# Patient Record
Sex: Female | Born: 1945
Health system: Southern US, Community
[De-identification: ages and names within clinical notes are randomized; demographics above are authoritative.]

## PROBLEM LIST (undated history)

## (undated) DIAGNOSIS — K219 Gastro-esophageal reflux disease without esophagitis: Secondary | ICD-10-CM

## (undated) DIAGNOSIS — E119 Type 2 diabetes mellitus without complications: Secondary | ICD-10-CM

## (undated) DIAGNOSIS — I2699 Other pulmonary embolism without acute cor pulmonale: Secondary | ICD-10-CM

## (undated) DIAGNOSIS — Z7901 Long term (current) use of anticoagulants: Secondary | ICD-10-CM

## (undated) DIAGNOSIS — IMO0002 Reserved for concepts with insufficient information to code with codable children: Secondary | ICD-10-CM

## (undated) DIAGNOSIS — G4734 Idiopathic sleep related nonobstructive alveolar hypoventilation: Secondary | ICD-10-CM

## (undated) DIAGNOSIS — N951 Menopausal and female climacteric states: Secondary | ICD-10-CM

## (undated) DIAGNOSIS — Z9289 Personal history of other medical treatment: Secondary | ICD-10-CM

## (undated) DIAGNOSIS — J9611 Chronic respiratory failure with hypoxia: Secondary | ICD-10-CM

## (undated) DIAGNOSIS — F419 Anxiety disorder, unspecified: Secondary | ICD-10-CM

## (undated) DIAGNOSIS — J449 Chronic obstructive pulmonary disease, unspecified: Secondary | ICD-10-CM

## (undated) DIAGNOSIS — I480 Paroxysmal atrial fibrillation: Secondary | ICD-10-CM

## (undated) DIAGNOSIS — I5189 Other ill-defined heart diseases: Secondary | ICD-10-CM

## (undated) DIAGNOSIS — K760 Fatty (change of) liver, not elsewhere classified: Secondary | ICD-10-CM

## (undated) DIAGNOSIS — G4700 Insomnia, unspecified: Secondary | ICD-10-CM

## (undated) DIAGNOSIS — M51369 Other intervertebral disc degeneration, lumbar region without mention of lumbar back pain or lower extremity pain: Secondary | ICD-10-CM

## (undated) DIAGNOSIS — Z9221 Personal history of antineoplastic chemotherapy: Secondary | ICD-10-CM

## (undated) DIAGNOSIS — I2510 Atherosclerotic heart disease of native coronary artery without angina pectoris: Secondary | ICD-10-CM

## (undated) DIAGNOSIS — L508 Other urticaria: Secondary | ICD-10-CM

## (undated) DIAGNOSIS — N823 Fistula of vagina to large intestine: Secondary | ICD-10-CM

## (undated) DIAGNOSIS — I700 Atherosclerosis of aorta: Secondary | ICD-10-CM

## (undated) DIAGNOSIS — C569 Malignant neoplasm of unspecified ovary: Secondary | ICD-10-CM

## (undated) DIAGNOSIS — J189 Pneumonia, unspecified organism: Secondary | ICD-10-CM

## (undated) DIAGNOSIS — M1990 Unspecified osteoarthritis, unspecified site: Secondary | ICD-10-CM

## (undated) DIAGNOSIS — I10 Essential (primary) hypertension: Secondary | ICD-10-CM

## (undated) DIAGNOSIS — G4730 Sleep apnea, unspecified: Secondary | ICD-10-CM

## (undated) DIAGNOSIS — F32A Depression, unspecified: Secondary | ICD-10-CM

## (undated) DIAGNOSIS — E785 Hyperlipidemia, unspecified: Secondary | ICD-10-CM

## (undated) DIAGNOSIS — R0789 Other chest pain: Secondary | ICD-10-CM

## (undated) HISTORY — PX: APPENDECTOMY: SHX54

## (undated) HISTORY — DX: Other urticaria: L50.8

## (undated) HISTORY — DX: Chronic obstructive pulmonary disease, unspecified: J44.9

## (undated) HISTORY — DX: Gastro-esophageal reflux disease without esophagitis: K21.9

## (undated) HISTORY — PX: OTHER SURGICAL HISTORY: SHX169

## (undated) HISTORY — DX: Malignant neoplasm of unspecified ovary: C56.9

## (undated) HISTORY — PX: ABDOMINAL HYSTERECTOMY: SHX81

## (undated) HISTORY — DX: Reserved for concepts with insufficient information to code with codable children: IMO0002

## (undated) HISTORY — DX: Pneumonia, unspecified organism: J18.9

## (undated) HISTORY — DX: Other ill-defined heart diseases: I51.89

## (undated) HISTORY — DX: Menopausal and female climacteric states: N95.1

## (undated) HISTORY — DX: Other chest pain: R07.89

## (undated) HISTORY — DX: Paroxysmal atrial fibrillation: I48.0

## (undated) HISTORY — PX: CHOLECYSTECTOMY: SHX55

---

## 1985-02-03 DIAGNOSIS — J189 Pneumonia, unspecified organism: Secondary | ICD-10-CM

## 1985-02-03 HISTORY — DX: Pneumonia, unspecified organism: J18.9

## 2003-11-13 ENCOUNTER — Ambulatory Visit: Payer: Self-pay | Admitting: Specialist

## 2004-11-04 ENCOUNTER — Ambulatory Visit: Payer: Self-pay | Admitting: Internal Medicine

## 2005-11-12 ENCOUNTER — Ambulatory Visit: Payer: Self-pay | Admitting: Internal Medicine

## 2006-11-16 ENCOUNTER — Ambulatory Visit: Payer: Self-pay | Admitting: Internal Medicine

## 2007-11-18 ENCOUNTER — Ambulatory Visit: Payer: Self-pay | Admitting: Internal Medicine

## 2008-04-05 ENCOUNTER — Ambulatory Visit: Payer: Self-pay | Admitting: Internal Medicine

## 2008-12-13 ENCOUNTER — Ambulatory Visit: Payer: Self-pay | Admitting: Internal Medicine

## 2009-12-17 ENCOUNTER — Ambulatory Visit: Payer: Self-pay | Admitting: Internal Medicine

## 2010-02-03 DIAGNOSIS — C569 Malignant neoplasm of unspecified ovary: Secondary | ICD-10-CM

## 2010-02-03 DIAGNOSIS — Z9221 Personal history of antineoplastic chemotherapy: Secondary | ICD-10-CM

## 2010-02-03 HISTORY — DX: Malignant neoplasm of unspecified ovary: C56.9

## 2010-02-03 HISTORY — DX: Personal history of antineoplastic chemotherapy: Z92.21

## 2010-12-07 ENCOUNTER — Inpatient Hospital Stay: Payer: Self-pay | Admitting: General Surgery

## 2010-12-07 DIAGNOSIS — Z01810 Encounter for preprocedural cardiovascular examination: Secondary | ICD-10-CM

## 2010-12-15 LAB — PATHOLOGY REPORT

## 2010-12-16 DIAGNOSIS — Z8543 Personal history of malignant neoplasm of ovary: Secondary | ICD-10-CM | POA: Insufficient documentation

## 2010-12-16 DIAGNOSIS — C569 Malignant neoplasm of unspecified ovary: Secondary | ICD-10-CM | POA: Insufficient documentation

## 2010-12-20 ENCOUNTER — Ambulatory Visit: Payer: Self-pay | Admitting: Internal Medicine

## 2011-01-09 DIAGNOSIS — K219 Gastro-esophageal reflux disease without esophagitis: Secondary | ICD-10-CM | POA: Insufficient documentation

## 2011-07-14 ENCOUNTER — Ambulatory Visit: Payer: Self-pay | Admitting: Neurology

## 2011-12-22 ENCOUNTER — Ambulatory Visit: Payer: Self-pay | Admitting: Internal Medicine

## 2012-07-07 ENCOUNTER — Encounter: Payer: Self-pay | Admitting: Internal Medicine

## 2012-07-08 ENCOUNTER — Institutional Professional Consult (permissible substitution): Payer: PRIVATE HEALTH INSURANCE | Admitting: Internal Medicine

## 2012-07-08 ENCOUNTER — Encounter: Payer: Self-pay | Admitting: Internal Medicine

## 2012-07-08 ENCOUNTER — Ambulatory Visit (INDEPENDENT_AMBULATORY_CARE_PROVIDER_SITE_OTHER): Payer: Medicare Other | Admitting: Internal Medicine

## 2012-07-08 VITALS — BP 118/72 | HR 78 | Temp 97.6°F | Ht 63.0 in | Wt 203.2 lb

## 2012-07-08 DIAGNOSIS — J441 Chronic obstructive pulmonary disease with (acute) exacerbation: Secondary | ICD-10-CM | POA: Insufficient documentation

## 2012-07-08 NOTE — Patient Instructions (Addendum)
Weight control is simply a matter of calorie balance which needs to be tilted in your favor by eating less and exercising more.  To get the most out of exercise, you need to be continuously aware that you are short of breath, but never out of breath, for 30 minutes daily. As you improve, it will actually be easier for you to do the same amount of exercise  in  30 minutes so always push to the level where you are short of breath.  If this does not result in gradual weight reduction then I strongly recommend you see a nutritionist with a food diary x 2 weeks so that we can work out a negative calorie balance which is universally effective in steady weight loss programs.  Think of your calorie balance like you do your bank account where in this case you want the balance to go down so you must take in less calories than you burn up.  It's just that simple:  Hard to do, but easy to understand.  Good luck!   Please schedule a follow up office visit in 6 weeks, call sooner if needed

## 2012-07-08 NOTE — Assessment & Plan Note (Addendum)
Clinically mild to moderate and really not limiting her at this point.   As I explained to this patient in detail:  although there may be significant copd present, it does not appear to be limiting activity tolerance any more than a set of worn tires limits someone from driving a car  around a parking lot.  A new set of Michelins might look good but would have no perceived impact on the performance of the car and would not be worth the cost.  For now will wait until pft's available before making recommendation for longterm rx.

## 2012-07-08 NOTE — Progress Notes (Signed)
  Subjective:    Patient ID: Kayla Wu, female    DOB: 10/29/1945  MRN: [id number]  HPI  59 yowf quit smoking June 2009 with chronic doe no better p quit smoking referred 07/08/2012 to pulmonary clinic for sob having gained 30 lbs since quit.  07/08/2012 1st pulmonary ov cc indolent onset doe x 30 years really no worse than baseline with more limit by bilateral calf pain esp going up inclines x sev months .  Can do 10 min on treadmill at 0 grade and 3 mph  No obvious daytime variabilty or assoc chronic cough or cp or chest tightness, subjective wheeze overt sinus or hb symptoms. No unusual exp hx or h/o childhood pna/ asthma or premature birth to her  knowledge.   Sleeping ok without nocturnal  or early am exacerbation  of respiratory  c/o's or need for noct saba. Also denies any obvious fluctuation of symptoms with weather or environmental changes or other aggravating or alleviating factors except as outlined above.   Review of Systems  Constitutional: Negative for fever, chills and unexpected weight change.  HENT: Negative for ear pain, nosebleeds, congestion, sore throat, rhinorrhea, sneezing, trouble swallowing, dental problem, voice change, postnasal drip and sinus pressure.   Eyes: Negative for visual disturbance.  Respiratory: Positive for shortness of breath. Negative for cough and choking.   Cardiovascular: Positive for leg swelling. Negative for chest pain.  Gastrointestinal: Negative for vomiting, abdominal pain and diarrhea.  Genitourinary: Negative for difficulty urinating.  Musculoskeletal: Negative for arthralgias.  Skin: Negative for rash.  Neurological: Negative for tremors, syncope and headaches.  Hematological: Does not bruise/bleed easily.  Psychiatric/Behavioral:       Anxiety       Objective:   Physical Exam   amb wf nad Wt Readings from Last 3 Encounters:  07/08/12 203 lb 3.2 oz (92.171 kg)     HEENT mild turbinate edema.  Oropharynx no thrush or excess  pnd or cobblestoning.  No JVD or cervical adenopathy. Mild accessory muscle hypertrophy. Trachea midline, nl thryroid. Chest was hyperinflated by percussion with diminished breath sounds and moderate increased exp time without wheeze. Hoover sign positive at mid inspiration. Regular rate and rhythm without murmur gallop or rub or increase P2 or edema.  Abd: no hsm, nl excursion. Ext warm without cyanosis or clubbing.    cxr 11/04/11 No acute cp dz       Assessment & Plan:

## 2012-07-16 ENCOUNTER — Encounter: Payer: Self-pay | Admitting: Internal Medicine

## 2012-08-19 ENCOUNTER — Ambulatory Visit: Payer: Medicare Other | Admitting: Internal Medicine

## 2012-12-27 ENCOUNTER — Ambulatory Visit: Payer: Self-pay | Admitting: Internal Medicine

## 2013-02-25 ENCOUNTER — Ambulatory Visit: Payer: Self-pay | Admitting: Internal Medicine

## 2013-03-28 ENCOUNTER — Ambulatory Visit: Payer: Self-pay | Admitting: Unknown Physician Specialty

## 2013-03-28 LAB — CBC WITH DIFFERENTIAL/PLATELET
Basophil #: 0.1 10*3/uL (ref 0.0–0.1)
Basophil %: 1.3 %
EOS ABS: 0.2 10*3/uL (ref 0.0–0.7)
EOS PCT: 1.8 %
HCT: 34.2 % — ABNORMAL LOW (ref 35.0–47.0)
HGB: 11.5 g/dL — ABNORMAL LOW (ref 12.0–16.0)
LYMPHS PCT: 26.7 %
Lymphocyte #: 2.8 10*3/uL (ref 1.0–3.6)
MCH: 31.1 pg (ref 26.0–34.0)
MCHC: 33.6 g/dL (ref 32.0–36.0)
MCV: 93 fL (ref 80–100)
MONO ABS: 0.8 x10 3/mm (ref 0.2–0.9)
Monocyte %: 7.2 %
Neutrophil #: 6.7 10*3/uL — ABNORMAL HIGH (ref 1.4–6.5)
Neutrophil %: 63 %
Platelet: 371 10*3/uL (ref 150–440)
RBC: 3.7 10*6/uL — AB (ref 3.80–5.20)
RDW: 13.3 % (ref 11.5–14.5)
WBC: 10.7 10*3/uL (ref 3.6–11.0)

## 2013-03-30 LAB — PATHOLOGY REPORT

## 2013-04-11 ENCOUNTER — Ambulatory Visit: Payer: Self-pay | Admitting: Podiatry

## 2013-04-19 ENCOUNTER — Ambulatory Visit: Payer: Self-pay | Admitting: Podiatry

## 2013-04-20 DIAGNOSIS — M539 Dorsopathy, unspecified: Secondary | ICD-10-CM | POA: Insufficient documentation

## 2013-04-20 DIAGNOSIS — M9979 Connective tissue and disc stenosis of intervertebral foramina of abdomen and other regions: Secondary | ICD-10-CM | POA: Insufficient documentation

## 2013-04-20 DIAGNOSIS — I10 Essential (primary) hypertension: Secondary | ICD-10-CM | POA: Insufficient documentation

## 2013-04-20 DIAGNOSIS — E785 Hyperlipidemia, unspecified: Secondary | ICD-10-CM | POA: Insufficient documentation

## 2013-04-20 DIAGNOSIS — E7800 Pure hypercholesterolemia, unspecified: Secondary | ICD-10-CM | POA: Insufficient documentation

## 2013-04-20 DIAGNOSIS — J431 Panlobular emphysema: Secondary | ICD-10-CM | POA: Insufficient documentation

## 2013-04-20 DIAGNOSIS — J449 Chronic obstructive pulmonary disease, unspecified: Secondary | ICD-10-CM | POA: Insufficient documentation

## 2014-01-17 ENCOUNTER — Ambulatory Visit: Payer: Self-pay | Admitting: Internal Medicine

## 2014-02-27 DIAGNOSIS — C569 Malignant neoplasm of unspecified ovary: Secondary | ICD-10-CM | POA: Diagnosis not present

## 2014-03-26 DIAGNOSIS — J069 Acute upper respiratory infection, unspecified: Secondary | ICD-10-CM | POA: Diagnosis not present

## 2014-05-15 DIAGNOSIS — C569 Malignant neoplasm of unspecified ovary: Secondary | ICD-10-CM | POA: Diagnosis not present

## 2014-05-16 DIAGNOSIS — E780 Pure hypercholesterolemia: Secondary | ICD-10-CM | POA: Diagnosis not present

## 2014-05-16 DIAGNOSIS — Z1329 Encounter for screening for other suspected endocrine disorder: Secondary | ICD-10-CM | POA: Diagnosis not present

## 2014-05-16 DIAGNOSIS — Z79899 Other long term (current) drug therapy: Secondary | ICD-10-CM | POA: Diagnosis not present

## 2014-05-16 DIAGNOSIS — J449 Chronic obstructive pulmonary disease, unspecified: Secondary | ICD-10-CM | POA: Diagnosis not present

## 2014-05-16 DIAGNOSIS — R7309 Other abnormal glucose: Secondary | ICD-10-CM | POA: Diagnosis not present

## 2014-05-16 DIAGNOSIS — R0602 Shortness of breath: Secondary | ICD-10-CM | POA: Diagnosis not present

## 2014-05-16 DIAGNOSIS — I10 Essential (primary) hypertension: Secondary | ICD-10-CM | POA: Diagnosis not present

## 2014-05-23 DIAGNOSIS — R0602 Shortness of breath: Secondary | ICD-10-CM | POA: Diagnosis not present

## 2014-05-29 DIAGNOSIS — M1812 Unilateral primary osteoarthritis of first carpometacarpal joint, left hand: Secondary | ICD-10-CM | POA: Diagnosis not present

## 2014-06-05 DIAGNOSIS — I10 Essential (primary) hypertension: Secondary | ICD-10-CM | POA: Diagnosis not present

## 2014-06-05 DIAGNOSIS — R0602 Shortness of breath: Secondary | ICD-10-CM | POA: Diagnosis not present

## 2014-06-05 DIAGNOSIS — D6489 Other specified anemias: Secondary | ICD-10-CM | POA: Diagnosis not present

## 2014-06-05 DIAGNOSIS — J449 Chronic obstructive pulmonary disease, unspecified: Secondary | ICD-10-CM | POA: Diagnosis not present

## 2014-06-06 ENCOUNTER — Other Ambulatory Visit: Payer: Self-pay | Admitting: Internal Medicine

## 2014-06-06 DIAGNOSIS — R0602 Shortness of breath: Secondary | ICD-10-CM

## 2014-06-07 ENCOUNTER — Ambulatory Visit
Admission: RE | Admit: 2014-06-07 | Discharge: 2014-06-07 | Disposition: A | Discharge status: Home / Self Care | Payer: Commercial Managed Care - HMO | Source: Ambulatory Visit | Attending: Internal Medicine | Admitting: Internal Medicine

## 2014-06-07 DIAGNOSIS — R0602 Shortness of breath: Secondary | ICD-10-CM | POA: Diagnosis present

## 2014-06-07 DIAGNOSIS — R918 Other nonspecific abnormal finding of lung field: Secondary | ICD-10-CM | POA: Insufficient documentation

## 2014-06-07 DIAGNOSIS — I2510 Atherosclerotic heart disease of native coronary artery without angina pectoris: Secondary | ICD-10-CM | POA: Diagnosis not present

## 2014-06-07 HISTORY — DX: Essential (primary) hypertension: I10

## 2014-06-07 MED ORDER — IOHEXOL 350 MG/ML SOLN
100.0000 mL | Freq: Once | INTRAVENOUS | Status: AC | PRN
  Administered 2014-06-07: 100 mL via INTRAVENOUS

## 2014-06-09 DIAGNOSIS — D6489 Other specified anemias: Secondary | ICD-10-CM | POA: Diagnosis not present

## 2014-07-06 DIAGNOSIS — D6489 Other specified anemias: Secondary | ICD-10-CM | POA: Diagnosis not present

## 2014-07-06 DIAGNOSIS — Z79899 Other long term (current) drug therapy: Secondary | ICD-10-CM | POA: Diagnosis not present

## 2014-07-11 DIAGNOSIS — I10 Essential (primary) hypertension: Secondary | ICD-10-CM | POA: Diagnosis not present

## 2014-07-11 DIAGNOSIS — E780 Pure hypercholesterolemia: Secondary | ICD-10-CM | POA: Diagnosis not present

## 2014-07-11 DIAGNOSIS — J449 Chronic obstructive pulmonary disease, unspecified: Secondary | ICD-10-CM | POA: Diagnosis not present

## 2014-07-11 DIAGNOSIS — R0609 Other forms of dyspnea: Secondary | ICD-10-CM | POA: Diagnosis not present

## 2014-07-19 DIAGNOSIS — R0602 Shortness of breath: Secondary | ICD-10-CM | POA: Diagnosis not present

## 2014-07-19 DIAGNOSIS — R079 Chest pain, unspecified: Secondary | ICD-10-CM | POA: Diagnosis not present

## 2014-07-19 DIAGNOSIS — R0609 Other forms of dyspnea: Secondary | ICD-10-CM | POA: Insufficient documentation

## 2014-07-19 DIAGNOSIS — R0681 Apnea, not elsewhere classified: Secondary | ICD-10-CM | POA: Insufficient documentation

## 2014-07-19 DIAGNOSIS — I10 Essential (primary) hypertension: Secondary | ICD-10-CM | POA: Diagnosis not present

## 2014-07-24 DIAGNOSIS — Z8543 Personal history of malignant neoplasm of ovary: Secondary | ICD-10-CM | POA: Diagnosis not present

## 2014-07-24 DIAGNOSIS — K219 Gastro-esophageal reflux disease without esophagitis: Secondary | ICD-10-CM | POA: Diagnosis not present

## 2014-07-24 DIAGNOSIS — E780 Pure hypercholesterolemia: Secondary | ICD-10-CM | POA: Diagnosis not present

## 2014-07-24 DIAGNOSIS — R0602 Shortness of breath: Secondary | ICD-10-CM | POA: Diagnosis not present

## 2014-07-24 DIAGNOSIS — C569 Malignant neoplasm of unspecified ovary: Secondary | ICD-10-CM | POA: Diagnosis not present

## 2014-07-24 DIAGNOSIS — Z6834 Body mass index (BMI) 34.0-34.9, adult: Secondary | ICD-10-CM | POA: Diagnosis not present

## 2014-07-24 DIAGNOSIS — Z08 Encounter for follow-up examination after completed treatment for malignant neoplasm: Secondary | ICD-10-CM | POA: Diagnosis not present

## 2014-07-31 DIAGNOSIS — E780 Pure hypercholesterolemia: Secondary | ICD-10-CM | POA: Diagnosis not present

## 2014-07-31 DIAGNOSIS — R0602 Shortness of breath: Secondary | ICD-10-CM | POA: Diagnosis not present

## 2014-07-31 DIAGNOSIS — I10 Essential (primary) hypertension: Secondary | ICD-10-CM | POA: Diagnosis not present

## 2014-07-31 DIAGNOSIS — R079 Chest pain, unspecified: Secondary | ICD-10-CM | POA: Diagnosis not present

## 2014-08-08 DIAGNOSIS — R0602 Shortness of breath: Secondary | ICD-10-CM | POA: Diagnosis not present

## 2014-08-29 DIAGNOSIS — J449 Chronic obstructive pulmonary disease, unspecified: Secondary | ICD-10-CM | POA: Diagnosis not present

## 2014-08-29 DIAGNOSIS — E780 Pure hypercholesterolemia: Secondary | ICD-10-CM | POA: Diagnosis not present

## 2014-08-29 DIAGNOSIS — R0602 Shortness of breath: Secondary | ICD-10-CM | POA: Diagnosis not present

## 2014-08-29 DIAGNOSIS — I10 Essential (primary) hypertension: Secondary | ICD-10-CM | POA: Diagnosis not present

## 2014-10-03 DIAGNOSIS — R7309 Other abnormal glucose: Secondary | ICD-10-CM | POA: Diagnosis not present

## 2014-10-03 DIAGNOSIS — E780 Pure hypercholesterolemia: Secondary | ICD-10-CM | POA: Diagnosis not present

## 2014-10-03 DIAGNOSIS — Z79899 Other long term (current) drug therapy: Secondary | ICD-10-CM | POA: Diagnosis not present

## 2014-10-11 DIAGNOSIS — R739 Hyperglycemia, unspecified: Secondary | ICD-10-CM | POA: Insufficient documentation

## 2014-10-11 DIAGNOSIS — E780 Pure hypercholesterolemia: Secondary | ICD-10-CM | POA: Diagnosis not present

## 2014-10-11 DIAGNOSIS — I10 Essential (primary) hypertension: Secondary | ICD-10-CM | POA: Diagnosis not present

## 2014-10-11 DIAGNOSIS — J431 Panlobular emphysema: Secondary | ICD-10-CM | POA: Diagnosis not present

## 2014-10-11 DIAGNOSIS — E118 Type 2 diabetes mellitus with unspecified complications: Secondary | ICD-10-CM | POA: Insufficient documentation

## 2014-10-16 DIAGNOSIS — C569 Malignant neoplasm of unspecified ovary: Secondary | ICD-10-CM | POA: Diagnosis not present

## 2014-10-20 DIAGNOSIS — M1812 Unilateral primary osteoarthritis of first carpometacarpal joint, left hand: Secondary | ICD-10-CM | POA: Diagnosis not present

## 2014-11-14 DIAGNOSIS — Z1283 Encounter for screening for malignant neoplasm of skin: Secondary | ICD-10-CM | POA: Diagnosis not present

## 2014-11-14 DIAGNOSIS — L820 Inflamed seborrheic keratosis: Secondary | ICD-10-CM | POA: Diagnosis not present

## 2014-11-14 DIAGNOSIS — L720 Epidermal cyst: Secondary | ICD-10-CM | POA: Diagnosis not present

## 2014-11-14 DIAGNOSIS — L718 Other rosacea: Secondary | ICD-10-CM | POA: Diagnosis not present

## 2014-11-14 DIAGNOSIS — D485 Neoplasm of uncertain behavior of skin: Secondary | ICD-10-CM | POA: Diagnosis not present

## 2014-11-14 DIAGNOSIS — D1800 Hemangioma unspecified site: Secondary | ICD-10-CM | POA: Diagnosis not present

## 2014-11-14 DIAGNOSIS — Z85828 Personal history of other malignant neoplasm of skin: Secondary | ICD-10-CM | POA: Diagnosis not present

## 2014-11-14 DIAGNOSIS — L821 Other seborrheic keratosis: Secondary | ICD-10-CM | POA: Diagnosis not present

## 2014-12-12 ENCOUNTER — Other Ambulatory Visit: Payer: Commercial Managed Care - HMO

## 2014-12-12 ENCOUNTER — Encounter
Admission: RE | Admit: 2014-12-12 | Discharge: 2014-12-12 | Disposition: A | Discharge status: Home / Self Care | Payer: Commercial Managed Care - HMO | Source: Ambulatory Visit | Attending: Specialist | Admitting: Specialist

## 2014-12-12 DIAGNOSIS — Z01812 Encounter for preprocedural laboratory examination: Secondary | ICD-10-CM | POA: Diagnosis not present

## 2014-12-12 LAB — CBC
HEMATOCRIT: 34.3 % — AB (ref 35.0–47.0)
Hemoglobin: 11.7 g/dL — ABNORMAL LOW (ref 12.0–16.0)
MCH: 29.6 pg (ref 26.0–34.0)
MCHC: 34.2 g/dL (ref 32.0–36.0)
MCV: 86.7 fL (ref 80.0–100.0)
Platelets: 400 10*3/uL (ref 150–440)
RBC: 3.95 MIL/uL (ref 3.80–5.20)
RDW: 18.5 % — ABNORMAL HIGH (ref 11.5–14.5)
WBC: 10.2 10*3/uL (ref 3.6–11.0)

## 2014-12-12 NOTE — Patient Instructions (Addendum)
  Your procedure is scheduled on: Friday Nov. 18, 2016. Report to Same Day Surgery. To find out your arrival time please call [phone] between 1PM - 3PM on Thursday Nov. 17, 2016.  Remember: Instructions that are not followed completely may result in serious medical risk, up to and including death, or upon the discretion of your surgeon and anesthesiologist your surgery may need to be rescheduled.    _x___ 1. Do not eat food or drink liquids after midnight. No gum chewing or hard candies.     ____ 2. No Alcohol for 24 hours before or after surgery.   ____ 3. Bring all medications with you on the day of surgery if instructed.    __x__ 4. Notify your doctor if there is any change in your medical condition     (cold, fever, infections).     Do not wear jewelry, make-up, hairpins, clips or nail polish.  Do not wear lotions, powders, or perfumes. You may wear deodorant.  Do not shave 48 hours prior to surgery. Men may shave face and neck.  Do not bring valuables to the hospital.    [facility] is not responsible for any belongings or valuables.               Contacts, dentures or bridgework may not be worn into surgery.  Leave your suitcase in the car. After surgery it may be brought to your room.  For patients admitted to the hospital, discharge time is determined by your treatment team.   Patients discharged the day of surgery will not be allowed to drive home.    Please read over the following fact sheets that you were given:   [facility] Preparing for Surgery  __x__ Take these medicines the morning of surgery with A SIP OF WATER:    1.losartan (COZAAR)    2. omeprazole (PRILOSEC)   ____ Fleet Enema (as directed)   _x___ Use CHG Soap as directed  ____ Use inhalers on the day of surgery  ____ Stop metformin 2 days prior to surgery    ____ Take 1/2 of usual insulin dose the night before surgery and none on the morning of surgery.   __x__ Stop aspirin on December 17, 2014 per Dr. Thompson Caul instructions.  _x__ Stop Anti-inflammatories Aleve now.  Tyelnol may be taken for pain.   ____ Stop supplements until after surgery.    ____ Bring C-Pap to the hospital.

## 2014-12-18 DIAGNOSIS — I10 Essential (primary) hypertension: Secondary | ICD-10-CM | POA: Diagnosis not present

## 2014-12-18 DIAGNOSIS — E7800 Pure hypercholesterolemia, unspecified: Secondary | ICD-10-CM | POA: Diagnosis not present

## 2014-12-18 DIAGNOSIS — J431 Panlobular emphysema: Secondary | ICD-10-CM | POA: Diagnosis not present

## 2014-12-18 DIAGNOSIS — R739 Hyperglycemia, unspecified: Secondary | ICD-10-CM | POA: Diagnosis not present

## 2014-12-22 ENCOUNTER — Encounter: Payer: Self-pay | Admitting: *Deleted

## 2014-12-22 ENCOUNTER — Ambulatory Visit
Admission: RE | Admit: 2014-12-22 | Discharge: 2014-12-22 | Disposition: A | Discharge status: Home / Self Care | Payer: Commercial Managed Care - HMO | Source: Ambulatory Visit | Attending: Specialist | Admitting: Specialist

## 2014-12-22 ENCOUNTER — Ambulatory Visit: Payer: Commercial Managed Care - HMO | Admitting: Certified Registered Nurse Anesthetist

## 2014-12-22 ENCOUNTER — Ambulatory Visit: Payer: Self-pay | Admitting: Specialist

## 2014-12-22 ENCOUNTER — Encounter
Admission: RE | Disposition: A | Discharge status: Home / Self Care | Payer: Self-pay | Source: Ambulatory Visit | Attending: Specialist

## 2014-12-22 DIAGNOSIS — Z87891 Personal history of nicotine dependence: Secondary | ICD-10-CM | POA: Insufficient documentation

## 2014-12-22 DIAGNOSIS — K219 Gastro-esophageal reflux disease without esophagitis: Secondary | ICD-10-CM | POA: Insufficient documentation

## 2014-12-22 DIAGNOSIS — M1990 Unspecified osteoarthritis, unspecified site: Secondary | ICD-10-CM | POA: Insufficient documentation

## 2014-12-22 DIAGNOSIS — J449 Chronic obstructive pulmonary disease, unspecified: Secondary | ICD-10-CM | POA: Diagnosis not present

## 2014-12-22 DIAGNOSIS — I10 Essential (primary) hypertension: Secondary | ICD-10-CM | POA: Insufficient documentation

## 2014-12-22 DIAGNOSIS — M1991 Primary osteoarthritis, unspecified site: Secondary | ICD-10-CM | POA: Diagnosis present

## 2014-12-22 DIAGNOSIS — M1812 Unilateral primary osteoarthritis of first carpometacarpal joint, left hand: Secondary | ICD-10-CM | POA: Diagnosis not present

## 2014-12-22 HISTORY — PX: FINGER ARTHROPLASTY: SHX5017

## 2014-12-22 SURGERY — ARTHROPLASTY, FINGER
Anesthesia: Regional | Site: Hand | Laterality: Left | Wound class: Clean

## 2014-12-22 MED ORDER — BUPIVACAINE HCL (PF) 0.5 % IJ SOLN
INTRAMUSCULAR | Status: DC | PRN
Start: 2014-12-22 — End: 2014-12-22
  Administered 2014-12-22: 8 mL

## 2014-12-22 MED ORDER — ACETAMINOPHEN 10 MG/ML IV SOLN
INTRAVENOUS | Status: DC | PRN
  Administered 2014-12-22: 1000 mg via INTRAVENOUS

## 2014-12-22 MED ORDER — CEFAZOLIN SODIUM-DEXTROSE 2-3 GM-% IV SOLR
INTRAVENOUS | Status: AC
  Filled 2014-12-22: qty 50

## 2014-12-22 MED ORDER — LIDOCAINE HCL (PF) 0.5 % IJ SOLN
INTRAMUSCULAR | Status: DC | PRN
  Administered 2014-12-22: 250 mg via INTRAVENOUS

## 2014-12-22 MED ORDER — BUPIVACAINE HCL (PF) 0.5 % IJ SOLN
INTRAMUSCULAR | Status: AC
  Filled 2014-12-22: qty 30

## 2014-12-22 MED ORDER — NEOMYCIN-POLYMYXIN B GU 40-200000 IR SOLN
Status: DC | PRN
  Administered 2014-12-22: 2 mL

## 2014-12-22 MED ORDER — ACETAMINOPHEN 10 MG/ML IV SOLN
INTRAVENOUS | Status: AC
  Filled 2014-12-22: qty 100

## 2014-12-22 MED ORDER — GELATIN ABSORBABLE 12-7 MM EX MISC
CUTANEOUS | Status: AC
  Filled 2014-12-22: qty 1

## 2014-12-22 MED ORDER — HYDROCODONE-ACETAMINOPHEN 5-325 MG PO TABS: 1.0000 | ORAL_TABLET | Freq: Four times a day (QID) | ORAL | Status: DC | PRN

## 2014-12-22 MED ORDER — MIDAZOLAM HCL 2 MG/2ML IJ SOLN
INTRAMUSCULAR | Status: DC | PRN
  Administered 2014-12-22: 2 mg via INTRAVENOUS

## 2014-12-22 MED ORDER — LIDOCAINE HCL (PF) 1 % IJ SOLN
INTRAMUSCULAR | Status: DC | PRN
  Administered 2014-12-22: 10 mL

## 2014-12-22 MED ORDER — CEFAZOLIN SODIUM-DEXTROSE 2-3 GM-% IV SOLR: 2.0000 g | Freq: Once | INTRAVENOUS | Status: DC

## 2014-12-22 MED ORDER — PROPOFOL 500 MG/50ML IV EMUL
INTRAVENOUS | Status: DC | PRN
  Administered 2014-12-22: 75 ug/kg/min via INTRAVENOUS

## 2014-12-22 MED ORDER — PROPOFOL 500 MG/50ML IV EMUL: INTRAVENOUS | Status: DC | PRN

## 2014-12-22 MED ORDER — FENTANYL CITRATE (PF) 100 MCG/2ML IJ SOLN
INTRAMUSCULAR | Status: DC | PRN
  Administered 2014-12-22: 50 ug via INTRAVENOUS
  Administered 2014-12-22 (×2): 25 ug via INTRAVENOUS

## 2014-12-22 MED ORDER — GELATIN ABSORBABLE 12-7 MM EX MISC
CUTANEOUS | Status: AC
Start: 2014-12-22 — End: 2014-12-22
  Filled 2014-12-22: qty 1

## 2014-12-22 MED ORDER — LIDOCAINE HCL (PF) 0.5 % IJ SOLN
INTRAMUSCULAR | Status: AC
  Filled 2014-12-22: qty 50

## 2014-12-22 MED ORDER — NEOMYCIN-POLYMYXIN B GU 40-200000 IR SOLN
Status: AC
  Filled 2014-12-22: qty 2

## 2014-12-22 MED ORDER — KETOROLAC TROMETHAMINE 30 MG/ML IJ SOLN
INTRAMUSCULAR | Status: DC | PRN
Start: 2014-12-22 — End: 2014-12-22
  Administered 2014-12-22: 30 mg via INTRAVENOUS

## 2014-12-22 MED ORDER — ONDANSETRON HCL 4 MG/2ML IJ SOLN: 4.0000 mg | Freq: Once | INTRAMUSCULAR | Status: DC | PRN

## 2014-12-22 MED ORDER — LACTATED RINGERS IV SOLN
INTRAVENOUS | Status: DC
  Administered 2014-12-22: 07:00:00 via INTRAVENOUS

## 2014-12-22 MED ORDER — FENTANYL CITRATE (PF) 100 MCG/2ML IJ SOLN: 25.0000 ug | INTRAMUSCULAR | Status: DC | PRN

## 2014-12-22 SURGICAL SUPPLY — 38 items
BANDAGE ELASTIC 3 LF NS (GAUZE/BANDAGES/DRESSINGS) ×2 IMPLANT
BIT DRILL 2.5 CANN LNG (BIT) ×2 IMPLANT
BIT DRILL 2.7 (BIT) ×1
BIT DRILL 2.7X2.7/3XSCR ANKL (BIT) ×1 IMPLANT
BIT DRL 2.7X2.7/3XSCR ANKL (BIT) ×1
BNDG ESMARK 4X12 TAN STRL LF (GAUZE/BANDAGES/DRESSINGS) ×2 IMPLANT
BUR 3.0X8 (BURR) ×2 IMPLANT
BUR RND POLISHING (BURR) IMPLANT
BUR SURG RND 3.0X8 FLTD (BURR) IMPLANT
CANISTER SUCT 1200ML W/VALVE (MISCELLANEOUS) ×2 IMPLANT
CAST PADDING 2X4YD ST 30245 (MISCELLANEOUS) ×1
CHLORAPREP W/TINT 26ML (MISCELLANEOUS) ×2 IMPLANT
GAUZE PETRO XEROFOAM 1X8 (MISCELLANEOUS) ×2 IMPLANT
GAUZE SPONGE 4X4 12PLY STRL (GAUZE/BANDAGES/DRESSINGS) ×2 IMPLANT
GLOVE BIO SURGEON STRL SZ7.5 (GLOVE) ×2 IMPLANT
GOWN STRL REUS W/ TWL LRG LVL3 (GOWN DISPOSABLE) ×1 IMPLANT
GOWN STRL REUS W/TWL LRG LVL3 (GOWN DISPOSABLE) ×1
NDL KEITH SZ2.5 (NEEDLE) ×2 IMPLANT
NEEDLE FILTER BLUNT 18X 1/2SAF (NEEDLE) ×1
NEEDLE FILTER BLUNT 18X1 1/2 (NEEDLE) ×1 IMPLANT
NS IRRIG 500ML POUR BTL (IV SOLUTION) ×2 IMPLANT
PACK EXTREMITY ARMC (MISCELLANEOUS) ×2 IMPLANT
PAD GROUND ADULT SPLIT (MISCELLANEOUS) ×2 IMPLANT
PADDING CAST COTTON 2X4 ST (MISCELLANEOUS) ×1 IMPLANT
SPLINT CAST 1 STEP 3X12 (MISCELLANEOUS) ×2 IMPLANT
STOCKINETTE STRL 4IN 9604848 (GAUZE/BANDAGES/DRESSINGS) ×2 IMPLANT
STRAP SAFETY BODY (MISCELLANEOUS) ×2 IMPLANT
STRIP CLOSURE SKIN 1/4X4 (GAUZE/BANDAGES/DRESSINGS) ×2 IMPLANT
SUT ETHIBOND 0 (SUTURE) ×2 IMPLANT
SUT ETHIBOND 3-0  EXTR (SUTURE) ×1
SUT ETHIBOND 3-0 EXTR (SUTURE) ×1 IMPLANT
SUT MERSILENE 4-0 WHT RB-1 (SUTURE) ×4 IMPLANT
SUT PROLENE 3 0 FS 2 (SUTURE) ×4 IMPLANT
SUT VIC AB 2-0 SH 27 (SUTURE) ×1
SUT VIC AB 2-0 SH 27XBRD (SUTURE) ×1 IMPLANT
SUT VICRYL 3-0 RB1 18 ABS (SUTURE) IMPLANT
SUT VICRYL 5-0 (SUTURE) ×2 IMPLANT
SYRINGE 10CC LL (SYRINGE) ×2 IMPLANT

## 2014-12-22 NOTE — Anesthesia Procedure Notes (Addendum)
Anesthesia Regional Block:  Bier block (IV Regional)  Pre-Anesthetic Checklist: ,, timeout performed, Correct Patient, Correct Site, Correct Laterality, Correct Procedure, Correct Position, site marked, Risks and benefits discussed,  Surgical consent,  Pre-op evaluation,  At surgeon's request and post-op pain management  Laterality: Left  Prep: alcohol swabs       Needles:   Needle Type: Other   (20G angiocath)        Additional Needles: Bier block (IV Regional) Narrative:  Start time: 12/22/2014 7:45 AM End time: 12/22/2014 7:50 AM  Performed by: Personally  Anesthesiologist: Alvin Critchley  Additional Notes: Time out called.   20 G angiocath in dorsum of Left hand.  Double tourniquet applied and checked with a pressure of 300 bilaterally.  [place] wrap with good effect and both tourniquets applied.  Lidocaine 0.5% plain with 1cc of toradol and 1cc of fentanyl injected with good effect.  Patient tolerated the procedure well and patient has numbness to pin prick   Date/Time: 12/22/2014 7:31 AM Performed by: Johnna Acosta Pre-anesthesia Checklist: Patient identified, Emergency Drugs available, Suction available, Patient being monitored and Timeout performed Patient Re-evaluated:Patient Re-evaluated prior to inductionOxygen Delivery Method: Nasal cannula

## 2014-12-22 NOTE — Transfer of Care (Signed)
Immediate Anesthesia Transfer of Care Note  Patient: Kayla Wu  Procedure(s) Performed: Procedure(s): FINGER ARTHROPLASTY (Left)  Patient Location: PACU  Anesthesia Type:Bier block  Level of Consciousness: sedated  Airway & Oxygen Therapy: Patient Spontanous Breathing and Patient connected to nasal cannula oxygen  Post-op Assessment: Report given to RN and Post -op Vital signs reviewed and stable  Post vital signs: Reviewed and stable  Last Vitals:  Filed Vitals:   12/22/14 0928  BP: 123/66  Pulse: 81  Temp: 36.1 C  Resp: 12    Complications: No apparent anesthesia complications

## 2014-12-22 NOTE — H&P (Signed)
  69 year old female with primary osteoarthritis CMC joint left thumb.  History and physical exam has been inserted in the record in the form of a paper document.  Heart and lungs are clear.  ENT normal.  Plan: CMC arthroplasty base of left thumb.

## 2014-12-22 NOTE — Brief Op Note (Signed)
12/22/2014  9:47 AM  PATIENT:  Kayla Wu  69 y.o. female  PRE-OPERATIVE DIAGNOSIS:  OSTEOARTHROSIS OF THE CARPOMETACARPAL JOINT OF THUMB  POST-OPERATIVE DIAGNOSIS:  OSTEOARTHROSIS OF THE CARPOMETACARPAL JOINT OF THUMB  PROCEDURE:  Procedure(s): FINGER ARTHROPLASTY (Left)  SURGEON:  Surgeon(s) and Role:    * [name], MD - Primary  PHYSICIAN ASSISTANT:   ASSISTANTS: none   ANESTHESIA:   regional  EBL:  Total I/O In: 400 [I.V.:400] Out: 0   BLOOD ADMINISTERED:none  DRAINS: none   LOCAL MEDICATIONS USED:  MARCAINE     SPECIMEN:  No Specimen  DISPOSITION OF SPECIMEN:  N/A  COUNTS:  YES  TOURNIQUET:    DICTATION: .Other Dictation: Dictation Number [id number]  PLAN OF CARE: Discharge to home after PACU  PATIENT DISPOSITION:  PACU - hemodynamically stable.   Delay start of Pharmacological VTE agent (>24hrs) due to surgical blood loss or risk of bleeding: not applicable

## 2014-12-22 NOTE — Progress Notes (Signed)
   12/22/14 0800  Clinical Encounter Type  Visited With Patient and family together  Visit Type Initial  Provided pastoral support and presence to patient and family member in pre op area of same day surgery.  [place] [phone]

## 2014-12-22 NOTE — Anesthesia Preprocedure Evaluation (Signed)
Anesthesia Evaluation  Patient identified by MRN, date of birth, ID band Patient awake    Reviewed: Allergy & Precautions, NPO status , Patient's Chart, lab work & pertinent test results  Airway Mallampati: III  TM Distance: <3 FB     Dental  (+) Chipped, Caps New right upper implant:   Pulmonary pneumonia, resolved, COPD,  COPD inhaler, former smoker,    Pulmonary exam normal breath sounds clear to auscultation       Cardiovascular hypertension, Pt. on medications Normal cardiovascular exam     Neuro/Psych negative neurological ROS  negative psych ROS   GI/Hepatic Neg liver ROS, GERD  Medicated and Controlled,  Endo/Other  negative endocrine ROS  Renal/GU negative Renal ROS  negative genitourinary   Musculoskeletal  (+) Arthritis , Osteoarthritis,    Abdominal   Peds negative pediatric ROS (+)  Hematology negative hematology ROS (+)   Anesthesia Other Findings   Reproductive/Obstetrics                             Anesthesia Physical Anesthesia Plan  ASA: III  Anesthesia Plan: Bier Block   Post-op Pain Management:    Induction: Intravenous  Airway Management Planned: Nasal Cannula  Additional Equipment:   Intra-op Plan:   Post-operative Plan:   Informed Consent: I have reviewed the patients History and Physical, chart, labs and discussed the procedure including the risks, benefits and alternatives for the proposed anesthesia with the patient or authorized representative who has indicated his/her understanding and acceptance.   Dental advisory given  Plan Discussed with: CRNA and Surgeon  Anesthesia Plan Comments:         Anesthesia Quick Evaluation

## 2014-12-22 NOTE — Discharge Instructions (Signed)
Elevate arm at all times Keep splint/dressing clean and dry.AMBULATORY SURGERY  DISCHARGE INSTRUCTIONS   1) The drugs that you were given will stay in your system until tomorrow so for the next 24 hours you should not:  A) Drive an automobile B) Make any legal decisions C) Drink any alcoholic beverage   2) You may resume regular meals tomorrow.  Today it is better to start with liquids and gradually work up to solid foods.  You may eat anything you prefer, but it is better to start with liquids, then soup and crackers, and gradually work up to solid foods.   3) Please notify your doctor immediately if you have any unusual bleeding, trouble breathing, redness and pain at the surgery site, drainage, fever, or pain not relieved by medication.    4) Additional Instructions:        Please contact your physician with any problems or Same Day Surgery at [phone], Monday through Friday 6 am to 4 pm, or Joanna at [facility] number at [phone].

## 2014-12-22 NOTE — Progress Notes (Signed)
Ancef 2 gm to OR with patient 

## 2014-12-23 NOTE — Op Note (Signed)
NAMEMarland Kitchen  Kayla, Wu             ACCOUNT NO.:  [id number]  MEDICAL RECORD NO.:  [id number]  LOCATION:  [facility]                         FACILITY:  [facility]  PHYSICIAN:  Margaretmary Eddy, MD        DATE OF BIRTH:  [birth year]  DATE OF PROCEDURE:  12/22/2014 DATE OF DISCHARGE:  12/22/2014                              OPERATIVE REPORT   PREOPERATIVE DIAGNOSIS:  Primary osteoarthritis carpometacarpal joint, left thumb.  POSTOPERATIVE DIAGNOSIS:  Primary osteoarthritis carpometacarpal joint, left thumb.  PROCEDURE:  Excisional trapezium arthroplasty carpometacarpal joint, left thumb.  SURGEON:  Margaretmary Eddy, MD.  ANESTHESIA:  IV regional.  COMPLICATIONS:  None.  TOURNIQUET TIME:  87 minutes.  DESCRIPTION OF PROCEDURE:  A 2 g of Ancef was given intravenously prior to initiating anesthesia.  IV regional anesthesia was induced to the left upper extremity.  The left upper extremity was thoroughly prepped with alcohol and ChloraPrep and draped in standard sterile fashion.  Under loupe magnification, standard volar incision was made directly over the carpometacarpal joint dorsally.  The dissection was carefully carried down with preservation of the cutaneous nerve and the tendons.  These were reflected to each side with the Spring retractor. A longitudinal incision was then made in the carpometacarpal joint capsule and the capsule was spared and reflected on each side. Trapezium was then completely identified and completely removed using a combination of the knife, the rongeur, and the TPS bur.  Careful palpation demonstrated no residual bone present.  Three Gel-Foam pieces were then pressed and shaped into the shape of a trapezium and secured in that position using a 4-0 Mersilene suture and sutured to the flexor tendon deep in the wound.  The overlying capsule was then thoroughly repaired using 0 Tycron.  The position of the thumb looked good.  The wound was thoroughly irrigated multiple  times again.  Subcutaneous tissue was closed with 3-0 Prolene.  Soft bulky dressing with a thumb spica splint was applied.  Tourniquet was released.  The patient was returned to the recovery room in satisfactory condition having tolerated the procedure quite well.          ______________________________ Margaretmary Eddy, MD    CS/MEDQ  D:  12/22/2014  T:  12/23/2014  Job:  [id number]

## 2014-12-25 NOTE — Anesthesia Postprocedure Evaluation (Signed)
Anesthesia Post Note  Patient: Kayla Wu  Procedure(s) Performed: Procedure(s) (LRB): FINGER ARTHROPLASTY (Left)  Patient location during evaluation: PACU Anesthesia Type: Bier Block Level of consciousness: awake, awake and alert and oriented Pain management: pain level controlled Vital Signs Assessment: post-procedure vital signs reviewed and stable Respiratory status: spontaneous breathing Cardiovascular status: blood pressure returned to baseline Anesthetic complications: no    Last Vitals:  Filed Vitals:   12/22/14 1027 12/22/14 1031  BP: 134/59 129/58  Pulse: 72 77  Temp:    Resp: 18 18    Last Pain:  Filed Vitals:   12/25/14 0757  PainSc: 4                  Orene Abbasi

## 2015-01-02 DIAGNOSIS — M1812 Unilateral primary osteoarthritis of first carpometacarpal joint, left hand: Secondary | ICD-10-CM | POA: Diagnosis not present

## 2015-01-04 DIAGNOSIS — I10 Essential (primary) hypertension: Secondary | ICD-10-CM | POA: Diagnosis not present

## 2015-01-04 DIAGNOSIS — Z79899 Other long term (current) drug therapy: Secondary | ICD-10-CM | POA: Diagnosis not present

## 2015-01-04 DIAGNOSIS — E7800 Pure hypercholesterolemia, unspecified: Secondary | ICD-10-CM | POA: Diagnosis not present

## 2015-01-04 DIAGNOSIS — R739 Hyperglycemia, unspecified: Secondary | ICD-10-CM | POA: Diagnosis not present

## 2015-01-11 ENCOUNTER — Other Ambulatory Visit: Payer: Self-pay | Admitting: Internal Medicine

## 2015-01-11 DIAGNOSIS — E7800 Pure hypercholesterolemia, unspecified: Secondary | ICD-10-CM | POA: Diagnosis not present

## 2015-01-11 DIAGNOSIS — Z1231 Encounter for screening mammogram for malignant neoplasm of breast: Secondary | ICD-10-CM

## 2015-01-11 DIAGNOSIS — Z1239 Encounter for other screening for malignant neoplasm of breast: Secondary | ICD-10-CM | POA: Diagnosis not present

## 2015-01-11 DIAGNOSIS — R739 Hyperglycemia, unspecified: Secondary | ICD-10-CM | POA: Diagnosis not present

## 2015-01-11 DIAGNOSIS — I10 Essential (primary) hypertension: Secondary | ICD-10-CM | POA: Diagnosis not present

## 2015-01-15 DIAGNOSIS — R110 Nausea: Secondary | ICD-10-CM | POA: Diagnosis not present

## 2015-01-15 DIAGNOSIS — M1990 Unspecified osteoarthritis, unspecified site: Secondary | ICD-10-CM | POA: Diagnosis not present

## 2015-01-15 DIAGNOSIS — C569 Malignant neoplasm of unspecified ovary: Secondary | ICD-10-CM | POA: Diagnosis not present

## 2015-01-15 DIAGNOSIS — M1812 Unilateral primary osteoarthritis of first carpometacarpal joint, left hand: Secondary | ICD-10-CM | POA: Diagnosis not present

## 2015-01-15 DIAGNOSIS — R600 Localized edema: Secondary | ICD-10-CM | POA: Diagnosis not present

## 2015-01-15 DIAGNOSIS — R0602 Shortness of breath: Secondary | ICD-10-CM | POA: Diagnosis not present

## 2015-01-15 DIAGNOSIS — Z9221 Personal history of antineoplastic chemotherapy: Secondary | ICD-10-CM | POA: Diagnosis not present

## 2015-01-15 DIAGNOSIS — Z8543 Personal history of malignant neoplasm of ovary: Secondary | ICD-10-CM | POA: Diagnosis not present

## 2015-01-15 DIAGNOSIS — Z6834 Body mass index (BMI) 34.0-34.9, adult: Secondary | ICD-10-CM | POA: Diagnosis not present

## 2015-01-15 DIAGNOSIS — Z90710 Acquired absence of both cervix and uterus: Secondary | ICD-10-CM | POA: Diagnosis not present

## 2015-01-15 DIAGNOSIS — Z08 Encounter for follow-up examination after completed treatment for malignant neoplasm: Secondary | ICD-10-CM | POA: Diagnosis not present

## 2015-01-15 DIAGNOSIS — Z90722 Acquired absence of ovaries, bilateral: Secondary | ICD-10-CM | POA: Diagnosis not present

## 2015-01-19 ENCOUNTER — Ambulatory Visit: Payer: Commercial Managed Care - HMO

## 2015-01-22 DIAGNOSIS — K429 Umbilical hernia without obstruction or gangrene: Secondary | ICD-10-CM | POA: Diagnosis not present

## 2015-01-22 DIAGNOSIS — C569 Malignant neoplasm of unspecified ovary: Secondary | ICD-10-CM | POA: Diagnosis not present

## 2015-01-22 DIAGNOSIS — K5730 Diverticulosis of large intestine without perforation or abscess without bleeding: Secondary | ICD-10-CM | POA: Diagnosis not present

## 2015-01-23 ENCOUNTER — Ambulatory Visit
Admission: RE | Admit: 2015-01-23 | Discharge: 2015-01-23 | Disposition: A | Discharge status: Home / Self Care | Payer: Commercial Managed Care - HMO | Source: Ambulatory Visit | Attending: Internal Medicine | Admitting: Internal Medicine

## 2015-01-23 ENCOUNTER — Other Ambulatory Visit: Payer: Self-pay | Admitting: Internal Medicine

## 2015-01-23 DIAGNOSIS — Z1231 Encounter for screening mammogram for malignant neoplasm of breast: Secondary | ICD-10-CM

## 2015-02-02 DIAGNOSIS — Z90710 Acquired absence of both cervix and uterus: Secondary | ICD-10-CM | POA: Diagnosis not present

## 2015-02-02 DIAGNOSIS — Z01818 Encounter for other preprocedural examination: Secondary | ICD-10-CM | POA: Diagnosis not present

## 2015-02-02 DIAGNOSIS — E7800 Pure hypercholesterolemia, unspecified: Secondary | ICD-10-CM | POA: Diagnosis not present

## 2015-02-02 DIAGNOSIS — Z79899 Other long term (current) drug therapy: Secondary | ICD-10-CM | POA: Diagnosis not present

## 2015-02-02 DIAGNOSIS — F329 Major depressive disorder, single episode, unspecified: Secondary | ICD-10-CM | POA: Diagnosis not present

## 2015-02-02 DIAGNOSIS — C569 Malignant neoplasm of unspecified ovary: Secondary | ICD-10-CM | POA: Diagnosis not present

## 2015-02-02 DIAGNOSIS — Z7982 Long term (current) use of aspirin: Secondary | ICD-10-CM | POA: Diagnosis not present

## 2015-02-02 DIAGNOSIS — K219 Gastro-esophageal reflux disease without esophagitis: Secondary | ICD-10-CM | POA: Diagnosis not present

## 2015-02-02 DIAGNOSIS — Z6834 Body mass index (BMI) 34.0-34.9, adult: Secondary | ICD-10-CM | POA: Diagnosis not present

## 2015-02-02 DIAGNOSIS — R0602 Shortness of breath: Secondary | ICD-10-CM | POA: Diagnosis not present

## 2015-02-02 DIAGNOSIS — I10 Essential (primary) hypertension: Secondary | ICD-10-CM | POA: Diagnosis not present

## 2015-02-02 DIAGNOSIS — Z87891 Personal history of nicotine dependence: Secondary | ICD-10-CM | POA: Diagnosis not present

## 2015-02-06 DIAGNOSIS — M1812 Unilateral primary osteoarthritis of first carpometacarpal joint, left hand: Secondary | ICD-10-CM | POA: Diagnosis not present

## 2015-02-09 DIAGNOSIS — E785 Hyperlipidemia, unspecified: Secondary | ICD-10-CM | POA: Diagnosis not present

## 2015-02-09 DIAGNOSIS — T8131XA Disruption of external operation (surgical) wound, not elsewhere classified, initial encounter: Secondary | ICD-10-CM | POA: Diagnosis not present

## 2015-02-09 DIAGNOSIS — C7911 Secondary malignant neoplasm of bladder: Secondary | ICD-10-CM | POA: Diagnosis not present

## 2015-02-09 DIAGNOSIS — R112 Nausea with vomiting, unspecified: Secondary | ICD-10-CM | POA: Diagnosis not present

## 2015-02-09 DIAGNOSIS — C786 Secondary malignant neoplasm of retroperitoneum and peritoneum: Secondary | ICD-10-CM | POA: Diagnosis not present

## 2015-02-09 DIAGNOSIS — T8132XA Disruption of internal operation (surgical) wound, not elsewhere classified, initial encounter: Secondary | ICD-10-CM | POA: Diagnosis not present

## 2015-02-09 DIAGNOSIS — I10 Essential (primary) hypertension: Secondary | ICD-10-CM | POA: Diagnosis not present

## 2015-02-09 DIAGNOSIS — K567 Ileus, unspecified: Secondary | ICD-10-CM | POA: Diagnosis not present

## 2015-02-09 DIAGNOSIS — C569 Malignant neoplasm of unspecified ovary: Secondary | ICD-10-CM | POA: Diagnosis not present

## 2015-02-09 DIAGNOSIS — R739 Hyperglycemia, unspecified: Secondary | ICD-10-CM | POA: Diagnosis not present

## 2015-02-09 DIAGNOSIS — J9811 Atelectasis: Secondary | ICD-10-CM | POA: Diagnosis not present

## 2015-02-09 DIAGNOSIS — Z452 Encounter for adjustment and management of vascular access device: Secondary | ICD-10-CM | POA: Diagnosis not present

## 2015-02-09 DIAGNOSIS — Z4682 Encounter for fitting and adjustment of non-vascular catheter: Secondary | ICD-10-CM | POA: Diagnosis not present

## 2015-02-09 DIAGNOSIS — C785 Secondary malignant neoplasm of large intestine and rectum: Secondary | ICD-10-CM | POA: Diagnosis not present

## 2015-02-09 DIAGNOSIS — C787 Secondary malignant neoplasm of liver and intrahepatic bile duct: Secondary | ICD-10-CM | POA: Diagnosis not present

## 2015-02-26 DIAGNOSIS — N823 Fistula of vagina to large intestine: Secondary | ICD-10-CM | POA: Diagnosis not present

## 2015-02-26 DIAGNOSIS — Z8543 Personal history of malignant neoplasm of ovary: Secondary | ICD-10-CM | POA: Diagnosis not present

## 2015-02-26 DIAGNOSIS — K769 Liver disease, unspecified: Secondary | ICD-10-CM | POA: Diagnosis not present

## 2015-02-26 DIAGNOSIS — I10 Essential (primary) hypertension: Secondary | ICD-10-CM | POA: Diagnosis not present

## 2015-02-26 DIAGNOSIS — E785 Hyperlipidemia, unspecified: Secondary | ICD-10-CM | POA: Diagnosis not present

## 2015-03-05 DIAGNOSIS — Z5111 Encounter for antineoplastic chemotherapy: Secondary | ICD-10-CM | POA: Diagnosis not present

## 2015-03-05 DIAGNOSIS — C569 Malignant neoplasm of unspecified ovary: Secondary | ICD-10-CM | POA: Diagnosis not present

## 2015-03-05 DIAGNOSIS — Z79899 Other long term (current) drug therapy: Secondary | ICD-10-CM | POA: Diagnosis not present

## 2015-03-15 DIAGNOSIS — Z5111 Encounter for antineoplastic chemotherapy: Secondary | ICD-10-CM | POA: Diagnosis not present

## 2015-03-15 DIAGNOSIS — C569 Malignant neoplasm of unspecified ovary: Secondary | ICD-10-CM | POA: Diagnosis not present

## 2015-03-16 DIAGNOSIS — Z5111 Encounter for antineoplastic chemotherapy: Secondary | ICD-10-CM | POA: Insufficient documentation

## 2015-03-21 DIAGNOSIS — C569 Malignant neoplasm of unspecified ovary: Secondary | ICD-10-CM | POA: Diagnosis not present

## 2015-03-21 DIAGNOSIS — Z5111 Encounter for antineoplastic chemotherapy: Secondary | ICD-10-CM | POA: Diagnosis not present

## 2015-03-22 DIAGNOSIS — R258 Other abnormal involuntary movements: Secondary | ICD-10-CM | POA: Diagnosis not present

## 2015-03-22 DIAGNOSIS — R259 Unspecified abnormal involuntary movements: Secondary | ICD-10-CM | POA: Diagnosis not present

## 2015-03-22 DIAGNOSIS — C569 Malignant neoplasm of unspecified ovary: Secondary | ICD-10-CM | POA: Diagnosis not present

## 2015-03-26 DIAGNOSIS — N823 Fistula of vagina to large intestine: Secondary | ICD-10-CM | POA: Diagnosis not present

## 2015-03-26 DIAGNOSIS — E7800 Pure hypercholesterolemia, unspecified: Secondary | ICD-10-CM | POA: Diagnosis not present

## 2015-03-26 DIAGNOSIS — I10 Essential (primary) hypertension: Secondary | ICD-10-CM | POA: Diagnosis not present

## 2015-03-26 DIAGNOSIS — Z90710 Acquired absence of both cervix and uterus: Secondary | ICD-10-CM | POA: Diagnosis not present

## 2015-03-26 DIAGNOSIS — Z79899 Other long term (current) drug therapy: Secondary | ICD-10-CM | POA: Diagnosis not present

## 2015-03-26 DIAGNOSIS — K219 Gastro-esophageal reflux disease without esophagitis: Secondary | ICD-10-CM | POA: Diagnosis not present

## 2015-03-26 DIAGNOSIS — Z9221 Personal history of antineoplastic chemotherapy: Secondary | ICD-10-CM | POA: Diagnosis not present

## 2015-03-26 DIAGNOSIS — C569 Malignant neoplasm of unspecified ovary: Secondary | ICD-10-CM | POA: Diagnosis not present

## 2015-03-26 DIAGNOSIS — Z6833 Body mass index (BMI) 33.0-33.9, adult: Secondary | ICD-10-CM | POA: Diagnosis not present

## 2015-03-27 DIAGNOSIS — Z5111 Encounter for antineoplastic chemotherapy: Secondary | ICD-10-CM | POA: Diagnosis not present

## 2015-03-27 DIAGNOSIS — C569 Malignant neoplasm of unspecified ovary: Secondary | ICD-10-CM | POA: Diagnosis not present

## 2015-03-28 DIAGNOSIS — C569 Malignant neoplasm of unspecified ovary: Secondary | ICD-10-CM | POA: Diagnosis not present

## 2015-03-28 DIAGNOSIS — Z5111 Encounter for antineoplastic chemotherapy: Secondary | ICD-10-CM | POA: Diagnosis not present

## 2015-04-04 DIAGNOSIS — Z5111 Encounter for antineoplastic chemotherapy: Secondary | ICD-10-CM | POA: Diagnosis not present

## 2015-04-04 DIAGNOSIS — C569 Malignant neoplasm of unspecified ovary: Secondary | ICD-10-CM | POA: Diagnosis not present

## 2015-04-05 DIAGNOSIS — Z0100 Encounter for examination of eyes and vision without abnormal findings: Secondary | ICD-10-CM | POA: Diagnosis not present

## 2015-04-05 DIAGNOSIS — E7800 Pure hypercholesterolemia, unspecified: Secondary | ICD-10-CM | POA: Diagnosis not present

## 2015-04-10 DIAGNOSIS — C569 Malignant neoplasm of unspecified ovary: Secondary | ICD-10-CM | POA: Diagnosis not present

## 2015-04-10 DIAGNOSIS — J431 Panlobular emphysema: Secondary | ICD-10-CM | POA: Diagnosis not present

## 2015-04-10 DIAGNOSIS — Z79899 Other long term (current) drug therapy: Secondary | ICD-10-CM | POA: Diagnosis not present

## 2015-04-10 DIAGNOSIS — I10 Essential (primary) hypertension: Secondary | ICD-10-CM | POA: Diagnosis not present

## 2015-04-10 DIAGNOSIS — E7800 Pure hypercholesterolemia, unspecified: Secondary | ICD-10-CM | POA: Diagnosis not present

## 2015-04-10 DIAGNOSIS — R739 Hyperglycemia, unspecified: Secondary | ICD-10-CM | POA: Diagnosis not present

## 2015-04-11 DIAGNOSIS — N390 Urinary tract infection, site not specified: Secondary | ICD-10-CM | POA: Diagnosis not present

## 2015-04-12 DIAGNOSIS — C569 Malignant neoplasm of unspecified ovary: Secondary | ICD-10-CM | POA: Diagnosis not present

## 2015-04-12 DIAGNOSIS — Z5111 Encounter for antineoplastic chemotherapy: Secondary | ICD-10-CM | POA: Diagnosis not present

## 2015-04-16 DIAGNOSIS — C569 Malignant neoplasm of unspecified ovary: Secondary | ICD-10-CM | POA: Diagnosis not present

## 2015-04-16 DIAGNOSIS — Z5111 Encounter for antineoplastic chemotherapy: Secondary | ICD-10-CM | POA: Diagnosis not present

## 2015-04-16 DIAGNOSIS — Z79899 Other long term (current) drug therapy: Secondary | ICD-10-CM | POA: Diagnosis not present

## 2015-04-17 DIAGNOSIS — C569 Malignant neoplasm of unspecified ovary: Secondary | ICD-10-CM | POA: Diagnosis not present

## 2015-04-18 DIAGNOSIS — Z5111 Encounter for antineoplastic chemotherapy: Secondary | ICD-10-CM | POA: Diagnosis not present

## 2015-04-18 DIAGNOSIS — C569 Malignant neoplasm of unspecified ovary: Secondary | ICD-10-CM | POA: Diagnosis not present

## 2015-04-26 DIAGNOSIS — C569 Malignant neoplasm of unspecified ovary: Secondary | ICD-10-CM | POA: Diagnosis not present

## 2015-04-26 DIAGNOSIS — Z5111 Encounter for antineoplastic chemotherapy: Secondary | ICD-10-CM | POA: Diagnosis not present

## 2015-05-02 DIAGNOSIS — C569 Malignant neoplasm of unspecified ovary: Secondary | ICD-10-CM | POA: Diagnosis not present

## 2015-05-02 DIAGNOSIS — Z79899 Other long term (current) drug therapy: Secondary | ICD-10-CM | POA: Diagnosis not present

## 2015-05-02 DIAGNOSIS — N823 Fistula of vagina to large intestine: Secondary | ICD-10-CM | POA: Diagnosis not present

## 2015-05-02 DIAGNOSIS — K5790 Diverticulosis of intestine, part unspecified, without perforation or abscess without bleeding: Secondary | ICD-10-CM | POA: Diagnosis not present

## 2015-05-02 DIAGNOSIS — Z5111 Encounter for antineoplastic chemotherapy: Secondary | ICD-10-CM | POA: Diagnosis not present

## 2015-05-02 DIAGNOSIS — K769 Liver disease, unspecified: Secondary | ICD-10-CM | POA: Diagnosis not present

## 2015-05-02 DIAGNOSIS — J9811 Atelectasis: Secondary | ICD-10-CM | POA: Diagnosis not present

## 2015-05-02 DIAGNOSIS — R911 Solitary pulmonary nodule: Secondary | ICD-10-CM | POA: Diagnosis not present

## 2015-05-07 DIAGNOSIS — C569 Malignant neoplasm of unspecified ovary: Secondary | ICD-10-CM | POA: Diagnosis not present

## 2015-05-07 DIAGNOSIS — Z5111 Encounter for antineoplastic chemotherapy: Secondary | ICD-10-CM | POA: Diagnosis not present

## 2015-05-07 DIAGNOSIS — Z79899 Other long term (current) drug therapy: Secondary | ICD-10-CM | POA: Diagnosis not present

## 2015-05-10 DIAGNOSIS — Z5111 Encounter for antineoplastic chemotherapy: Secondary | ICD-10-CM | POA: Diagnosis not present

## 2015-05-10 DIAGNOSIS — C569 Malignant neoplasm of unspecified ovary: Secondary | ICD-10-CM | POA: Diagnosis not present

## 2015-05-17 DIAGNOSIS — C569 Malignant neoplasm of unspecified ovary: Secondary | ICD-10-CM | POA: Diagnosis not present

## 2015-05-17 DIAGNOSIS — Z5111 Encounter for antineoplastic chemotherapy: Secondary | ICD-10-CM | POA: Diagnosis not present

## 2015-05-24 DIAGNOSIS — Z5111 Encounter for antineoplastic chemotherapy: Secondary | ICD-10-CM | POA: Diagnosis not present

## 2015-05-24 DIAGNOSIS — C569 Malignant neoplasm of unspecified ovary: Secondary | ICD-10-CM | POA: Diagnosis not present

## 2015-05-28 DIAGNOSIS — Z5111 Encounter for antineoplastic chemotherapy: Secondary | ICD-10-CM | POA: Diagnosis not present

## 2015-05-28 DIAGNOSIS — C569 Malignant neoplasm of unspecified ovary: Secondary | ICD-10-CM | POA: Diagnosis not present

## 2015-05-31 DIAGNOSIS — C569 Malignant neoplasm of unspecified ovary: Secondary | ICD-10-CM | POA: Diagnosis not present

## 2015-05-31 DIAGNOSIS — Z5111 Encounter for antineoplastic chemotherapy: Secondary | ICD-10-CM | POA: Diagnosis not present

## 2015-06-06 DIAGNOSIS — Z5111 Encounter for antineoplastic chemotherapy: Secondary | ICD-10-CM | POA: Diagnosis not present

## 2015-06-06 DIAGNOSIS — C569 Malignant neoplasm of unspecified ovary: Secondary | ICD-10-CM | POA: Diagnosis not present

## 2015-06-14 DIAGNOSIS — C569 Malignant neoplasm of unspecified ovary: Secondary | ICD-10-CM | POA: Diagnosis not present

## 2015-06-14 DIAGNOSIS — Z5111 Encounter for antineoplastic chemotherapy: Secondary | ICD-10-CM | POA: Diagnosis not present

## 2015-06-18 DIAGNOSIS — C569 Malignant neoplasm of unspecified ovary: Secondary | ICD-10-CM | POA: Diagnosis not present

## 2015-06-18 DIAGNOSIS — Z79899 Other long term (current) drug therapy: Secondary | ICD-10-CM | POA: Diagnosis not present

## 2015-06-18 DIAGNOSIS — Z5111 Encounter for antineoplastic chemotherapy: Secondary | ICD-10-CM | POA: Diagnosis not present

## 2015-06-21 DIAGNOSIS — C569 Malignant neoplasm of unspecified ovary: Secondary | ICD-10-CM | POA: Diagnosis not present

## 2015-06-21 DIAGNOSIS — Z5111 Encounter for antineoplastic chemotherapy: Secondary | ICD-10-CM | POA: Diagnosis not present

## 2015-06-25 ENCOUNTER — Ambulatory Visit (INDEPENDENT_AMBULATORY_CARE_PROVIDER_SITE_OTHER): Payer: Commercial Managed Care - HMO | Admitting: Podiatry

## 2015-06-25 ENCOUNTER — Encounter: Payer: Self-pay | Admitting: Podiatry

## 2015-06-25 VITALS — BP 111/60 | HR 101 | Resp 16

## 2015-06-25 DIAGNOSIS — L600 Ingrowing nail: Secondary | ICD-10-CM | POA: Diagnosis not present

## 2015-06-25 MED ORDER — NEOMYCIN-POLYMYXIN-HC 1 % OT SOLN: OTIC | Status: DC

## 2015-06-25 NOTE — Patient Instructions (Signed)

## 2015-06-25 NOTE — Progress Notes (Signed)
   Subjective:    Patient ID: Kayla Wu, female    DOB: 26-Jan-1946, 70 y.o.   MRN: [id number]  HPI: She presents today recovering from her last chemotherapy for ovarian cancer which had metastasized to her colon. She is complaining of a painful nail spicule there remains to the tibial border of the hallux left after a matrixectomy by another local physician. She states it is painful like to have it removed.    Review of Systems  Constitutional: Positive for diaphoresis.  All other systems reviewed and are negative.      Objective:   Physical Exam: Vital signs are stable alert and oriented 3. Pulses are palpable. Neurologic sensorium is intact. Deep tendon reflexes are intact. Muscle strength is normal bilateral. Orthopedic evaluation shows all joints distal to the ankle range of motion without crepitation. Cutaneous evaluation of straight supple well-hydrated cutis no erythema edema saline as drainage or odor. Sharp spicule of nail and remains along the tibial border of the hallux left which is painful. No signs of infection.  Assessment:  Ingrown nail remaining nail spicule hallux left.  Plan: Spicule was removed today with a chemical matrixectomy after local anesthesia was achieved. She tolerated this procedure well. She was given both oral and written home-going instructions for the care and soaking of the toe as well as a prescription for Cortisporin Otic to be applied twice daily after soaking. Follow up with her in 1 week.        Assessment & Plan:

## 2015-06-28 DIAGNOSIS — C569 Malignant neoplasm of unspecified ovary: Secondary | ICD-10-CM | POA: Diagnosis not present

## 2015-06-28 DIAGNOSIS — Z5111 Encounter for antineoplastic chemotherapy: Secondary | ICD-10-CM | POA: Diagnosis not present

## 2015-06-28 DIAGNOSIS — R258 Other abnormal involuntary movements: Secondary | ICD-10-CM | POA: Diagnosis not present

## 2015-07-03 DIAGNOSIS — Z5111 Encounter for antineoplastic chemotherapy: Secondary | ICD-10-CM | POA: Diagnosis not present

## 2015-07-03 DIAGNOSIS — C569 Malignant neoplasm of unspecified ovary: Secondary | ICD-10-CM | POA: Diagnosis not present

## 2015-07-04 ENCOUNTER — Ambulatory Visit (INDEPENDENT_AMBULATORY_CARE_PROVIDER_SITE_OTHER): Payer: Commercial Managed Care - HMO | Admitting: Podiatry

## 2015-07-04 ENCOUNTER — Encounter: Payer: Self-pay | Admitting: Podiatry

## 2015-07-04 DIAGNOSIS — Z5111 Encounter for antineoplastic chemotherapy: Secondary | ICD-10-CM | POA: Diagnosis not present

## 2015-07-04 DIAGNOSIS — C569 Malignant neoplasm of unspecified ovary: Secondary | ICD-10-CM | POA: Diagnosis not present

## 2015-07-04 DIAGNOSIS — L600 Ingrowing nail: Secondary | ICD-10-CM

## 2015-07-04 NOTE — Progress Notes (Signed)
She presents today for follow-up of her matrixectomy tibial border hallux left. She denies fever chills nausea vomiting muscle aches and pains. States that she is having a hard time with gallstones at this point.  Objective: Vital signs are stable she is alert and oriented 3. No erythema edema saline as drainage or odor.  Assessment: Toe appears to be healing very well.  Plan: Discontinue Betadine soaked Epsom salts and water soaks covered during the day and leave open at bedtime continue course for DAILY and I will follow-up with her as needed.

## 2015-07-04 NOTE — Patient Instructions (Signed)

## 2015-07-05 DIAGNOSIS — Z79899 Other long term (current) drug therapy: Secondary | ICD-10-CM | POA: Diagnosis not present

## 2015-07-05 DIAGNOSIS — R918 Other nonspecific abnormal finding of lung field: Secondary | ICD-10-CM | POA: Diagnosis not present

## 2015-07-05 DIAGNOSIS — K760 Fatty (change of) liver, not elsewhere classified: Secondary | ICD-10-CM | POA: Diagnosis not present

## 2015-07-05 DIAGNOSIS — C562 Malignant neoplasm of left ovary: Secondary | ICD-10-CM | POA: Diagnosis not present

## 2015-07-05 DIAGNOSIS — C569 Malignant neoplasm of unspecified ovary: Secondary | ICD-10-CM | POA: Diagnosis not present

## 2015-07-05 DIAGNOSIS — R911 Solitary pulmonary nodule: Secondary | ICD-10-CM | POA: Diagnosis not present

## 2015-07-11 DIAGNOSIS — R0602 Shortness of breath: Secondary | ICD-10-CM | POA: Diagnosis not present

## 2015-07-11 DIAGNOSIS — R635 Abnormal weight gain: Secondary | ICD-10-CM | POA: Diagnosis not present

## 2015-07-11 DIAGNOSIS — I10 Essential (primary) hypertension: Secondary | ICD-10-CM | POA: Diagnosis not present

## 2015-07-11 DIAGNOSIS — E7800 Pure hypercholesterolemia, unspecified: Secondary | ICD-10-CM | POA: Diagnosis not present

## 2015-07-11 DIAGNOSIS — J431 Panlobular emphysema: Secondary | ICD-10-CM | POA: Diagnosis not present

## 2015-07-11 DIAGNOSIS — Z79899 Other long term (current) drug therapy: Secondary | ICD-10-CM | POA: Diagnosis not present

## 2015-07-11 DIAGNOSIS — R739 Hyperglycemia, unspecified: Secondary | ICD-10-CM | POA: Diagnosis not present

## 2015-07-12 DIAGNOSIS — Z5111 Encounter for antineoplastic chemotherapy: Secondary | ICD-10-CM | POA: Diagnosis not present

## 2015-07-12 DIAGNOSIS — C569 Malignant neoplasm of unspecified ovary: Secondary | ICD-10-CM | POA: Diagnosis not present

## 2015-07-12 DIAGNOSIS — N390 Urinary tract infection, site not specified: Secondary | ICD-10-CM | POA: Diagnosis not present

## 2015-07-16 DIAGNOSIS — Z90710 Acquired absence of both cervix and uterus: Secondary | ICD-10-CM | POA: Diagnosis not present

## 2015-07-16 DIAGNOSIS — Z800 Family history of malignant neoplasm of digestive organs: Secondary | ICD-10-CM | POA: Diagnosis not present

## 2015-07-16 DIAGNOSIS — Z803 Family history of malignant neoplasm of breast: Secondary | ICD-10-CM | POA: Diagnosis not present

## 2015-07-16 DIAGNOSIS — Z9049 Acquired absence of other specified parts of digestive tract: Secondary | ICD-10-CM | POA: Diagnosis not present

## 2015-07-16 DIAGNOSIS — Z9221 Personal history of antineoplastic chemotherapy: Secondary | ICD-10-CM | POA: Diagnosis not present

## 2015-07-16 DIAGNOSIS — I10 Essential (primary) hypertension: Secondary | ICD-10-CM | POA: Diagnosis not present

## 2015-07-16 DIAGNOSIS — G629 Polyneuropathy, unspecified: Secondary | ICD-10-CM | POA: Diagnosis not present

## 2015-07-16 DIAGNOSIS — N823 Fistula of vagina to large intestine: Secondary | ICD-10-CM | POA: Diagnosis not present

## 2015-07-16 DIAGNOSIS — Z6835 Body mass index (BMI) 35.0-35.9, adult: Secondary | ICD-10-CM | POA: Diagnosis not present

## 2015-07-16 DIAGNOSIS — C569 Malignant neoplasm of unspecified ovary: Secondary | ICD-10-CM | POA: Diagnosis not present

## 2015-07-24 DIAGNOSIS — E2740 Unspecified adrenocortical insufficiency: Secondary | ICD-10-CM | POA: Diagnosis not present

## 2015-07-24 DIAGNOSIS — R7989 Other specified abnormal findings of blood chemistry: Secondary | ICD-10-CM | POA: Insufficient documentation

## 2015-07-25 DIAGNOSIS — E2740 Unspecified adrenocortical insufficiency: Secondary | ICD-10-CM | POA: Diagnosis not present

## 2015-08-13 DIAGNOSIS — D6481 Anemia due to antineoplastic chemotherapy: Secondary | ICD-10-CM | POA: Diagnosis not present

## 2015-09-24 DIAGNOSIS — G629 Polyneuropathy, unspecified: Secondary | ICD-10-CM | POA: Diagnosis not present

## 2015-09-24 DIAGNOSIS — C569 Malignant neoplasm of unspecified ovary: Secondary | ICD-10-CM | POA: Diagnosis not present

## 2015-09-24 DIAGNOSIS — K219 Gastro-esophageal reflux disease without esophagitis: Secondary | ICD-10-CM | POA: Diagnosis not present

## 2015-09-24 DIAGNOSIS — Z800 Family history of malignant neoplasm of digestive organs: Secondary | ICD-10-CM | POA: Diagnosis not present

## 2015-09-24 DIAGNOSIS — E7800 Pure hypercholesterolemia, unspecified: Secondary | ICD-10-CM | POA: Diagnosis not present

## 2015-09-24 DIAGNOSIS — R911 Solitary pulmonary nodule: Secondary | ICD-10-CM | POA: Diagnosis not present

## 2015-09-24 DIAGNOSIS — Z90710 Acquired absence of both cervix and uterus: Secondary | ICD-10-CM | POA: Diagnosis not present

## 2015-09-24 DIAGNOSIS — Z808 Family history of malignant neoplasm of other organs or systems: Secondary | ICD-10-CM | POA: Diagnosis not present

## 2015-09-24 DIAGNOSIS — I10 Essential (primary) hypertension: Secondary | ICD-10-CM | POA: Diagnosis not present

## 2015-10-16 DIAGNOSIS — M25411 Effusion, right shoulder: Secondary | ICD-10-CM | POA: Diagnosis not present

## 2015-10-16 DIAGNOSIS — M79621 Pain in right upper arm: Secondary | ICD-10-CM | POA: Diagnosis not present

## 2015-10-16 DIAGNOSIS — M79601 Pain in right arm: Secondary | ICD-10-CM | POA: Diagnosis not present

## 2015-10-16 DIAGNOSIS — R739 Hyperglycemia, unspecified: Secondary | ICD-10-CM | POA: Diagnosis not present

## 2015-10-16 DIAGNOSIS — Z1329 Encounter for screening for other suspected endocrine disorder: Secondary | ICD-10-CM | POA: Diagnosis not present

## 2015-10-16 DIAGNOSIS — K5792 Diverticulitis of intestine, part unspecified, without perforation or abscess without bleeding: Secondary | ICD-10-CM | POA: Diagnosis not present

## 2015-10-16 DIAGNOSIS — E7800 Pure hypercholesterolemia, unspecified: Secondary | ICD-10-CM | POA: Diagnosis not present

## 2015-10-16 DIAGNOSIS — N390 Urinary tract infection, site not specified: Secondary | ICD-10-CM | POA: Diagnosis not present

## 2015-10-16 DIAGNOSIS — Z79899 Other long term (current) drug therapy: Secondary | ICD-10-CM | POA: Diagnosis not present

## 2015-10-16 DIAGNOSIS — J441 Chronic obstructive pulmonary disease with (acute) exacerbation: Secondary | ICD-10-CM | POA: Diagnosis not present

## 2015-10-16 DIAGNOSIS — I10 Essential (primary) hypertension: Secondary | ICD-10-CM | POA: Diagnosis not present

## 2015-10-26 DIAGNOSIS — M7541 Impingement syndrome of right shoulder: Secondary | ICD-10-CM | POA: Diagnosis not present

## 2015-10-30 ENCOUNTER — Other Ambulatory Visit: Payer: Self-pay | Admitting: Orthopedic Surgery

## 2015-10-30 DIAGNOSIS — M25511 Pain in right shoulder: Secondary | ICD-10-CM

## 2015-11-12 ENCOUNTER — Ambulatory Visit
Admission: RE | Admit: 2015-11-12 | Discharge: 2015-11-12 | Disposition: A | Discharge status: Home / Self Care | Payer: Commercial Managed Care - HMO | Source: Ambulatory Visit | Attending: Orthopedic Surgery | Admitting: Orthopedic Surgery

## 2015-11-12 DIAGNOSIS — M25511 Pain in right shoulder: Secondary | ICD-10-CM | POA: Insufficient documentation

## 2015-11-12 DIAGNOSIS — M19011 Primary osteoarthritis, right shoulder: Secondary | ICD-10-CM | POA: Insufficient documentation

## 2015-11-12 DIAGNOSIS — S46011A Strain of muscle(s) and tendon(s) of the rotator cuff of right shoulder, initial encounter: Secondary | ICD-10-CM | POA: Diagnosis not present

## 2015-11-12 DIAGNOSIS — M75101 Unspecified rotator cuff tear or rupture of right shoulder, not specified as traumatic: Secondary | ICD-10-CM | POA: Insufficient documentation

## 2015-12-14 ENCOUNTER — Other Ambulatory Visit: Payer: Self-pay | Admitting: Internal Medicine

## 2015-12-14 DIAGNOSIS — Z1231 Encounter for screening mammogram for malignant neoplasm of breast: Secondary | ICD-10-CM

## 2015-12-24 DIAGNOSIS — Z808 Family history of malignant neoplasm of other organs or systems: Secondary | ICD-10-CM | POA: Diagnosis not present

## 2015-12-24 DIAGNOSIS — R911 Solitary pulmonary nodule: Secondary | ICD-10-CM | POA: Diagnosis not present

## 2015-12-24 DIAGNOSIS — G629 Polyneuropathy, unspecified: Secondary | ICD-10-CM | POA: Diagnosis not present

## 2015-12-24 DIAGNOSIS — C569 Malignant neoplasm of unspecified ovary: Secondary | ICD-10-CM | POA: Diagnosis not present

## 2015-12-24 DIAGNOSIS — Z90710 Acquired absence of both cervix and uterus: Secondary | ICD-10-CM | POA: Diagnosis not present

## 2015-12-24 DIAGNOSIS — Z800 Family history of malignant neoplasm of digestive organs: Secondary | ICD-10-CM | POA: Diagnosis not present

## 2015-12-24 DIAGNOSIS — Z6834 Body mass index (BMI) 34.0-34.9, adult: Secondary | ICD-10-CM | POA: Diagnosis not present

## 2015-12-24 DIAGNOSIS — N823 Fistula of vagina to large intestine: Secondary | ICD-10-CM | POA: Diagnosis not present

## 2015-12-24 DIAGNOSIS — Z803 Family history of malignant neoplasm of breast: Secondary | ICD-10-CM | POA: Diagnosis not present

## 2015-12-24 DIAGNOSIS — K7689 Other specified diseases of liver: Secondary | ICD-10-CM | POA: Diagnosis not present

## 2016-01-15 DIAGNOSIS — C569 Malignant neoplasm of unspecified ovary: Secondary | ICD-10-CM | POA: Diagnosis not present

## 2016-01-15 DIAGNOSIS — R739 Hyperglycemia, unspecified: Secondary | ICD-10-CM | POA: Diagnosis not present

## 2016-01-15 DIAGNOSIS — N390 Urinary tract infection, site not specified: Secondary | ICD-10-CM | POA: Diagnosis not present

## 2016-01-15 DIAGNOSIS — E7800 Pure hypercholesterolemia, unspecified: Secondary | ICD-10-CM | POA: Diagnosis not present

## 2016-01-15 DIAGNOSIS — Z0000 Encounter for general adult medical examination without abnormal findings: Secondary | ICD-10-CM | POA: Diagnosis not present

## 2016-01-15 DIAGNOSIS — Z79899 Other long term (current) drug therapy: Secondary | ICD-10-CM | POA: Diagnosis not present

## 2016-01-15 DIAGNOSIS — Z1329 Encounter for screening for other suspected endocrine disorder: Secondary | ICD-10-CM | POA: Diagnosis not present

## 2016-01-15 DIAGNOSIS — I10 Essential (primary) hypertension: Secondary | ICD-10-CM | POA: Diagnosis not present

## 2016-01-21 DIAGNOSIS — Z803 Family history of malignant neoplasm of breast: Secondary | ICD-10-CM | POA: Diagnosis not present

## 2016-01-21 DIAGNOSIS — K219 Gastro-esophageal reflux disease without esophagitis: Secondary | ICD-10-CM | POA: Diagnosis not present

## 2016-01-21 DIAGNOSIS — C569 Malignant neoplasm of unspecified ovary: Secondary | ICD-10-CM | POA: Diagnosis not present

## 2016-01-21 DIAGNOSIS — I10 Essential (primary) hypertension: Secondary | ICD-10-CM | POA: Diagnosis not present

## 2016-01-21 DIAGNOSIS — E119 Type 2 diabetes mellitus without complications: Secondary | ICD-10-CM | POA: Diagnosis not present

## 2016-01-21 DIAGNOSIS — E118 Type 2 diabetes mellitus with unspecified complications: Secondary | ICD-10-CM | POA: Diagnosis not present

## 2016-01-21 DIAGNOSIS — Z87891 Personal history of nicotine dependence: Secondary | ICD-10-CM | POA: Diagnosis not present

## 2016-01-21 DIAGNOSIS — E669 Obesity, unspecified: Secondary | ICD-10-CM | POA: Diagnosis not present

## 2016-01-21 DIAGNOSIS — Z808 Family history of malignant neoplasm of other organs or systems: Secondary | ICD-10-CM | POA: Diagnosis not present

## 2016-01-21 DIAGNOSIS — Z800 Family history of malignant neoplasm of digestive organs: Secondary | ICD-10-CM | POA: Diagnosis not present

## 2016-01-21 DIAGNOSIS — E7800 Pure hypercholesterolemia, unspecified: Secondary | ICD-10-CM | POA: Diagnosis not present

## 2016-01-21 DIAGNOSIS — Z01818 Encounter for other preprocedural examination: Secondary | ICD-10-CM | POA: Diagnosis not present

## 2016-01-21 DIAGNOSIS — F418 Other specified anxiety disorders: Secondary | ICD-10-CM | POA: Diagnosis not present

## 2016-01-21 DIAGNOSIS — E785 Hyperlipidemia, unspecified: Secondary | ICD-10-CM | POA: Diagnosis not present

## 2016-01-24 ENCOUNTER — Ambulatory Visit
Admission: RE | Admit: 2016-01-24 | Discharge: 2016-01-24 | Disposition: A | Discharge status: Home / Self Care | Payer: Commercial Managed Care - HMO | Source: Ambulatory Visit | Attending: Internal Medicine | Admitting: Internal Medicine

## 2016-01-24 DIAGNOSIS — Z1231 Encounter for screening mammogram for malignant neoplasm of breast: Secondary | ICD-10-CM | POA: Diagnosis not present

## 2016-02-08 DIAGNOSIS — N823 Fistula of vagina to large intestine: Secondary | ICD-10-CM | POA: Diagnosis not present

## 2016-02-08 DIAGNOSIS — C569 Malignant neoplasm of unspecified ovary: Secondary | ICD-10-CM | POA: Diagnosis not present

## 2016-02-08 DIAGNOSIS — Q438 Other specified congenital malformations of intestine: Secondary | ICD-10-CM | POA: Diagnosis not present

## 2016-02-08 DIAGNOSIS — K219 Gastro-esophageal reflux disease without esophagitis: Secondary | ICD-10-CM | POA: Diagnosis not present

## 2016-02-08 DIAGNOSIS — I10 Essential (primary) hypertension: Secondary | ICD-10-CM | POA: Diagnosis not present

## 2016-02-08 DIAGNOSIS — N135 Crossing vessel and stricture of ureter without hydronephrosis: Secondary | ICD-10-CM | POA: Diagnosis not present

## 2016-02-08 DIAGNOSIS — C541 Malignant neoplasm of endometrium: Secondary | ICD-10-CM | POA: Diagnosis not present

## 2016-02-08 DIAGNOSIS — R188 Other ascites: Secondary | ICD-10-CM | POA: Diagnosis not present

## 2016-02-08 DIAGNOSIS — E119 Type 2 diabetes mellitus without complications: Secondary | ICD-10-CM | POA: Diagnosis not present

## 2016-02-08 DIAGNOSIS — N824 Other female intestinal-genital tract fistulae: Secondary | ICD-10-CM | POA: Diagnosis not present

## 2016-02-08 DIAGNOSIS — R918 Other nonspecific abnormal finding of lung field: Secondary | ICD-10-CM | POA: Diagnosis not present

## 2016-02-08 DIAGNOSIS — R140 Abdominal distension (gaseous): Secondary | ICD-10-CM | POA: Diagnosis not present

## 2016-02-08 DIAGNOSIS — R1909 Other intra-abdominal and pelvic swelling, mass and lump: Secondary | ICD-10-CM | POA: Diagnosis not present

## 2016-02-08 DIAGNOSIS — K567 Ileus, unspecified: Secondary | ICD-10-CM | POA: Diagnosis not present

## 2016-02-08 DIAGNOSIS — Z4682 Encounter for fitting and adjustment of non-vascular catheter: Secondary | ICD-10-CM | POA: Diagnosis not present

## 2016-02-08 DIAGNOSIS — Z8543 Personal history of malignant neoplasm of ovary: Secondary | ICD-10-CM | POA: Diagnosis not present

## 2016-02-08 DIAGNOSIS — F419 Anxiety disorder, unspecified: Secondary | ICD-10-CM | POA: Diagnosis not present

## 2016-02-08 DIAGNOSIS — K9189 Other postprocedural complications and disorders of digestive system: Secondary | ICD-10-CM | POA: Diagnosis not present

## 2016-02-08 DIAGNOSIS — J9811 Atelectasis: Secondary | ICD-10-CM | POA: Diagnosis not present

## 2016-02-08 DIAGNOSIS — R0902 Hypoxemia: Secondary | ICD-10-CM | POA: Diagnosis not present

## 2016-02-08 DIAGNOSIS — R933 Abnormal findings on diagnostic imaging of other parts of digestive tract: Secondary | ICD-10-CM | POA: Diagnosis not present

## 2016-02-08 DIAGNOSIS — R0602 Shortness of breath: Secondary | ICD-10-CM | POA: Diagnosis not present

## 2016-02-24 DIAGNOSIS — C786 Secondary malignant neoplasm of retroperitoneum and peritoneum: Secondary | ICD-10-CM | POA: Diagnosis not present

## 2016-02-24 DIAGNOSIS — Z432 Encounter for attention to ileostomy: Secondary | ICD-10-CM | POA: Diagnosis not present

## 2016-02-24 DIAGNOSIS — C569 Malignant neoplasm of unspecified ovary: Secondary | ICD-10-CM | POA: Diagnosis not present

## 2016-02-24 DIAGNOSIS — C7911 Secondary malignant neoplasm of bladder: Secondary | ICD-10-CM | POA: Diagnosis not present

## 2016-02-24 DIAGNOSIS — E119 Type 2 diabetes mellitus without complications: Secondary | ICD-10-CM | POA: Diagnosis not present

## 2016-02-24 DIAGNOSIS — C785 Secondary malignant neoplasm of large intestine and rectum: Secondary | ICD-10-CM | POA: Diagnosis not present

## 2016-02-24 DIAGNOSIS — I10 Essential (primary) hypertension: Secondary | ICD-10-CM | POA: Diagnosis not present

## 2016-02-24 DIAGNOSIS — J449 Chronic obstructive pulmonary disease, unspecified: Secondary | ICD-10-CM | POA: Diagnosis not present

## 2016-02-24 DIAGNOSIS — Z483 Aftercare following surgery for neoplasm: Secondary | ICD-10-CM | POA: Diagnosis not present

## 2016-02-28 DIAGNOSIS — J449 Chronic obstructive pulmonary disease, unspecified: Secondary | ICD-10-CM | POA: Diagnosis not present

## 2016-02-28 DIAGNOSIS — I10 Essential (primary) hypertension: Secondary | ICD-10-CM | POA: Diagnosis not present

## 2016-02-28 DIAGNOSIS — Z432 Encounter for attention to ileostomy: Secondary | ICD-10-CM | POA: Diagnosis not present

## 2016-02-28 DIAGNOSIS — C7911 Secondary malignant neoplasm of bladder: Secondary | ICD-10-CM | POA: Diagnosis not present

## 2016-02-28 DIAGNOSIS — C569 Malignant neoplasm of unspecified ovary: Secondary | ICD-10-CM | POA: Diagnosis not present

## 2016-02-28 DIAGNOSIS — C785 Secondary malignant neoplasm of large intestine and rectum: Secondary | ICD-10-CM | POA: Diagnosis not present

## 2016-02-28 DIAGNOSIS — Z483 Aftercare following surgery for neoplasm: Secondary | ICD-10-CM | POA: Diagnosis not present

## 2016-02-28 DIAGNOSIS — C786 Secondary malignant neoplasm of retroperitoneum and peritoneum: Secondary | ICD-10-CM | POA: Diagnosis not present

## 2016-02-28 DIAGNOSIS — E119 Type 2 diabetes mellitus without complications: Secondary | ICD-10-CM | POA: Diagnosis not present

## 2016-03-04 DIAGNOSIS — C785 Secondary malignant neoplasm of large intestine and rectum: Secondary | ICD-10-CM | POA: Diagnosis not present

## 2016-03-04 DIAGNOSIS — C569 Malignant neoplasm of unspecified ovary: Secondary | ICD-10-CM | POA: Diagnosis not present

## 2016-03-04 DIAGNOSIS — C786 Secondary malignant neoplasm of retroperitoneum and peritoneum: Secondary | ICD-10-CM | POA: Diagnosis not present

## 2016-03-04 DIAGNOSIS — E119 Type 2 diabetes mellitus without complications: Secondary | ICD-10-CM | POA: Diagnosis not present

## 2016-03-04 DIAGNOSIS — C7911 Secondary malignant neoplasm of bladder: Secondary | ICD-10-CM | POA: Diagnosis not present

## 2016-03-04 DIAGNOSIS — I10 Essential (primary) hypertension: Secondary | ICD-10-CM | POA: Diagnosis not present

## 2016-03-04 DIAGNOSIS — Z432 Encounter for attention to ileostomy: Secondary | ICD-10-CM | POA: Diagnosis not present

## 2016-03-04 DIAGNOSIS — J449 Chronic obstructive pulmonary disease, unspecified: Secondary | ICD-10-CM | POA: Diagnosis not present

## 2016-03-04 DIAGNOSIS — Z483 Aftercare following surgery for neoplasm: Secondary | ICD-10-CM | POA: Diagnosis not present

## 2016-03-08 DIAGNOSIS — J449 Chronic obstructive pulmonary disease, unspecified: Secondary | ICD-10-CM | POA: Diagnosis not present

## 2016-03-08 DIAGNOSIS — Z432 Encounter for attention to ileostomy: Secondary | ICD-10-CM | POA: Diagnosis not present

## 2016-03-08 DIAGNOSIS — Z483 Aftercare following surgery for neoplasm: Secondary | ICD-10-CM | POA: Diagnosis not present

## 2016-03-08 DIAGNOSIS — I10 Essential (primary) hypertension: Secondary | ICD-10-CM | POA: Diagnosis not present

## 2016-03-08 DIAGNOSIS — C785 Secondary malignant neoplasm of large intestine and rectum: Secondary | ICD-10-CM | POA: Diagnosis not present

## 2016-03-08 DIAGNOSIS — C7911 Secondary malignant neoplasm of bladder: Secondary | ICD-10-CM | POA: Diagnosis not present

## 2016-03-08 DIAGNOSIS — C786 Secondary malignant neoplasm of retroperitoneum and peritoneum: Secondary | ICD-10-CM | POA: Diagnosis not present

## 2016-03-08 DIAGNOSIS — E119 Type 2 diabetes mellitus without complications: Secondary | ICD-10-CM | POA: Diagnosis not present

## 2016-03-08 DIAGNOSIS — C569 Malignant neoplasm of unspecified ovary: Secondary | ICD-10-CM | POA: Diagnosis not present

## 2016-03-10 DIAGNOSIS — E119 Type 2 diabetes mellitus without complications: Secondary | ICD-10-CM | POA: Diagnosis not present

## 2016-03-10 DIAGNOSIS — Z90710 Acquired absence of both cervix and uterus: Secondary | ICD-10-CM | POA: Diagnosis not present

## 2016-03-10 DIAGNOSIS — I10 Essential (primary) hypertension: Secondary | ICD-10-CM | POA: Diagnosis not present

## 2016-03-10 DIAGNOSIS — E7800 Pure hypercholesterolemia, unspecified: Secondary | ICD-10-CM | POA: Diagnosis not present

## 2016-03-10 DIAGNOSIS — C569 Malignant neoplasm of unspecified ovary: Secondary | ICD-10-CM | POA: Diagnosis not present

## 2016-03-10 DIAGNOSIS — K219 Gastro-esophageal reflux disease without esophagitis: Secondary | ICD-10-CM | POA: Diagnosis not present

## 2016-03-10 DIAGNOSIS — Z803 Family history of malignant neoplasm of breast: Secondary | ICD-10-CM | POA: Diagnosis not present

## 2016-03-10 DIAGNOSIS — Z808 Family history of malignant neoplasm of other organs or systems: Secondary | ICD-10-CM | POA: Diagnosis not present

## 2016-03-10 DIAGNOSIS — K625 Hemorrhage of anus and rectum: Secondary | ICD-10-CM | POA: Diagnosis not present

## 2016-03-11 DIAGNOSIS — I10 Essential (primary) hypertension: Secondary | ICD-10-CM | POA: Diagnosis not present

## 2016-03-11 DIAGNOSIS — C7911 Secondary malignant neoplasm of bladder: Secondary | ICD-10-CM | POA: Diagnosis not present

## 2016-03-11 DIAGNOSIS — C786 Secondary malignant neoplasm of retroperitoneum and peritoneum: Secondary | ICD-10-CM | POA: Diagnosis not present

## 2016-03-11 DIAGNOSIS — Z483 Aftercare following surgery for neoplasm: Secondary | ICD-10-CM | POA: Diagnosis not present

## 2016-03-11 DIAGNOSIS — C785 Secondary malignant neoplasm of large intestine and rectum: Secondary | ICD-10-CM | POA: Diagnosis not present

## 2016-03-11 DIAGNOSIS — J449 Chronic obstructive pulmonary disease, unspecified: Secondary | ICD-10-CM | POA: Diagnosis not present

## 2016-03-11 DIAGNOSIS — C569 Malignant neoplasm of unspecified ovary: Secondary | ICD-10-CM | POA: Diagnosis not present

## 2016-03-11 DIAGNOSIS — E119 Type 2 diabetes mellitus without complications: Secondary | ICD-10-CM | POA: Diagnosis not present

## 2016-03-11 DIAGNOSIS — Z432 Encounter for attention to ileostomy: Secondary | ICD-10-CM | POA: Diagnosis not present

## 2016-03-11 DIAGNOSIS — N390 Urinary tract infection, site not specified: Secondary | ICD-10-CM | POA: Diagnosis not present

## 2016-03-13 DIAGNOSIS — C569 Malignant neoplasm of unspecified ovary: Secondary | ICD-10-CM | POA: Diagnosis not present

## 2016-03-13 DIAGNOSIS — C786 Secondary malignant neoplasm of retroperitoneum and peritoneum: Secondary | ICD-10-CM | POA: Diagnosis not present

## 2016-03-13 DIAGNOSIS — J449 Chronic obstructive pulmonary disease, unspecified: Secondary | ICD-10-CM | POA: Diagnosis not present

## 2016-03-13 DIAGNOSIS — E119 Type 2 diabetes mellitus without complications: Secondary | ICD-10-CM | POA: Diagnosis not present

## 2016-03-13 DIAGNOSIS — I10 Essential (primary) hypertension: Secondary | ICD-10-CM | POA: Diagnosis not present

## 2016-03-13 DIAGNOSIS — Z432 Encounter for attention to ileostomy: Secondary | ICD-10-CM | POA: Diagnosis not present

## 2016-03-13 DIAGNOSIS — Z483 Aftercare following surgery for neoplasm: Secondary | ICD-10-CM | POA: Diagnosis not present

## 2016-03-13 DIAGNOSIS — C785 Secondary malignant neoplasm of large intestine and rectum: Secondary | ICD-10-CM | POA: Diagnosis not present

## 2016-03-13 DIAGNOSIS — C7911 Secondary malignant neoplasm of bladder: Secondary | ICD-10-CM | POA: Diagnosis not present

## 2016-03-18 DIAGNOSIS — C786 Secondary malignant neoplasm of retroperitoneum and peritoneum: Secondary | ICD-10-CM | POA: Diagnosis not present

## 2016-03-18 DIAGNOSIS — I10 Essential (primary) hypertension: Secondary | ICD-10-CM | POA: Diagnosis not present

## 2016-03-18 DIAGNOSIS — E119 Type 2 diabetes mellitus without complications: Secondary | ICD-10-CM | POA: Diagnosis not present

## 2016-03-18 DIAGNOSIS — C785 Secondary malignant neoplasm of large intestine and rectum: Secondary | ICD-10-CM | POA: Diagnosis not present

## 2016-03-18 DIAGNOSIS — Z432 Encounter for attention to ileostomy: Secondary | ICD-10-CM | POA: Diagnosis not present

## 2016-03-18 DIAGNOSIS — J449 Chronic obstructive pulmonary disease, unspecified: Secondary | ICD-10-CM | POA: Diagnosis not present

## 2016-03-18 DIAGNOSIS — C7911 Secondary malignant neoplasm of bladder: Secondary | ICD-10-CM | POA: Diagnosis not present

## 2016-03-18 DIAGNOSIS — Z483 Aftercare following surgery for neoplasm: Secondary | ICD-10-CM | POA: Diagnosis not present

## 2016-03-18 DIAGNOSIS — C569 Malignant neoplasm of unspecified ovary: Secondary | ICD-10-CM | POA: Diagnosis not present

## 2016-03-20 DIAGNOSIS — Z1329 Encounter for screening for other suspected endocrine disorder: Secondary | ICD-10-CM | POA: Diagnosis not present

## 2016-03-20 DIAGNOSIS — R251 Tremor, unspecified: Secondary | ICD-10-CM | POA: Insufficient documentation

## 2016-03-20 DIAGNOSIS — I10 Essential (primary) hypertension: Secondary | ICD-10-CM | POA: Diagnosis not present

## 2016-03-20 DIAGNOSIS — Z79899 Other long term (current) drug therapy: Secondary | ICD-10-CM | POA: Diagnosis not present

## 2016-03-20 DIAGNOSIS — C785 Secondary malignant neoplasm of large intestine and rectum: Secondary | ICD-10-CM | POA: Diagnosis not present

## 2016-03-20 DIAGNOSIS — R739 Hyperglycemia, unspecified: Secondary | ICD-10-CM | POA: Diagnosis not present

## 2016-03-20 DIAGNOSIS — C569 Malignant neoplasm of unspecified ovary: Secondary | ICD-10-CM | POA: Diagnosis not present

## 2016-03-20 DIAGNOSIS — Z483 Aftercare following surgery for neoplasm: Secondary | ICD-10-CM | POA: Diagnosis not present

## 2016-03-20 DIAGNOSIS — C786 Secondary malignant neoplasm of retroperitoneum and peritoneum: Secondary | ICD-10-CM | POA: Diagnosis not present

## 2016-03-21 DIAGNOSIS — N390 Urinary tract infection, site not specified: Secondary | ICD-10-CM | POA: Diagnosis not present

## 2016-03-26 DIAGNOSIS — I10 Essential (primary) hypertension: Secondary | ICD-10-CM | POA: Diagnosis not present

## 2016-03-26 DIAGNOSIS — C569 Malignant neoplasm of unspecified ovary: Secondary | ICD-10-CM | POA: Diagnosis not present

## 2016-03-26 DIAGNOSIS — E119 Type 2 diabetes mellitus without complications: Secondary | ICD-10-CM | POA: Diagnosis not present

## 2016-03-26 DIAGNOSIS — Z432 Encounter for attention to ileostomy: Secondary | ICD-10-CM | POA: Diagnosis not present

## 2016-03-26 DIAGNOSIS — C785 Secondary malignant neoplasm of large intestine and rectum: Secondary | ICD-10-CM | POA: Diagnosis not present

## 2016-03-26 DIAGNOSIS — C786 Secondary malignant neoplasm of retroperitoneum and peritoneum: Secondary | ICD-10-CM | POA: Diagnosis not present

## 2016-03-26 DIAGNOSIS — C7911 Secondary malignant neoplasm of bladder: Secondary | ICD-10-CM | POA: Diagnosis not present

## 2016-03-26 DIAGNOSIS — Z483 Aftercare following surgery for neoplasm: Secondary | ICD-10-CM | POA: Diagnosis not present

## 2016-03-26 DIAGNOSIS — J449 Chronic obstructive pulmonary disease, unspecified: Secondary | ICD-10-CM | POA: Diagnosis not present

## 2016-03-27 DIAGNOSIS — C569 Malignant neoplasm of unspecified ovary: Secondary | ICD-10-CM | POA: Diagnosis not present

## 2016-03-28 DIAGNOSIS — E118 Type 2 diabetes mellitus with unspecified complications: Secondary | ICD-10-CM | POA: Diagnosis not present

## 2016-03-28 DIAGNOSIS — Z8543 Personal history of malignant neoplasm of ovary: Secondary | ICD-10-CM | POA: Diagnosis not present

## 2016-03-28 DIAGNOSIS — E7800 Pure hypercholesterolemia, unspecified: Secondary | ICD-10-CM | POA: Diagnosis not present

## 2016-03-28 DIAGNOSIS — Z01818 Encounter for other preprocedural examination: Secondary | ICD-10-CM | POA: Diagnosis not present

## 2016-03-28 DIAGNOSIS — Z932 Ileostomy status: Secondary | ICD-10-CM | POA: Diagnosis not present

## 2016-03-28 DIAGNOSIS — E119 Type 2 diabetes mellitus without complications: Secondary | ICD-10-CM | POA: Diagnosis not present

## 2016-03-28 DIAGNOSIS — I10 Essential (primary) hypertension: Secondary | ICD-10-CM | POA: Diagnosis not present

## 2016-03-28 DIAGNOSIS — J449 Chronic obstructive pulmonary disease, unspecified: Secondary | ICD-10-CM | POA: Diagnosis not present

## 2016-03-28 DIAGNOSIS — F418 Other specified anxiety disorders: Secondary | ICD-10-CM | POA: Diagnosis not present

## 2016-03-28 DIAGNOSIS — R251 Tremor, unspecified: Secondary | ICD-10-CM | POA: Diagnosis not present

## 2016-03-28 DIAGNOSIS — K219 Gastro-esophageal reflux disease without esophagitis: Secondary | ICD-10-CM | POA: Diagnosis not present

## 2016-03-28 DIAGNOSIS — N823 Fistula of vagina to large intestine: Secondary | ICD-10-CM | POA: Diagnosis not present

## 2016-03-28 DIAGNOSIS — F419 Anxiety disorder, unspecified: Secondary | ICD-10-CM | POA: Diagnosis not present

## 2016-03-28 DIAGNOSIS — C569 Malignant neoplasm of unspecified ovary: Secondary | ICD-10-CM | POA: Diagnosis not present

## 2016-03-28 DIAGNOSIS — R110 Nausea: Secondary | ICD-10-CM | POA: Diagnosis not present

## 2016-03-31 DIAGNOSIS — J449 Chronic obstructive pulmonary disease, unspecified: Secondary | ICD-10-CM | POA: Diagnosis not present

## 2016-03-31 DIAGNOSIS — Z483 Aftercare following surgery for neoplasm: Secondary | ICD-10-CM | POA: Diagnosis not present

## 2016-03-31 DIAGNOSIS — C786 Secondary malignant neoplasm of retroperitoneum and peritoneum: Secondary | ICD-10-CM | POA: Diagnosis not present

## 2016-03-31 DIAGNOSIS — C785 Secondary malignant neoplasm of large intestine and rectum: Secondary | ICD-10-CM | POA: Diagnosis not present

## 2016-03-31 DIAGNOSIS — I10 Essential (primary) hypertension: Secondary | ICD-10-CM | POA: Diagnosis not present

## 2016-03-31 DIAGNOSIS — E119 Type 2 diabetes mellitus without complications: Secondary | ICD-10-CM | POA: Diagnosis not present

## 2016-03-31 DIAGNOSIS — C7911 Secondary malignant neoplasm of bladder: Secondary | ICD-10-CM | POA: Diagnosis not present

## 2016-03-31 DIAGNOSIS — C569 Malignant neoplasm of unspecified ovary: Secondary | ICD-10-CM | POA: Diagnosis not present

## 2016-03-31 DIAGNOSIS — Z432 Encounter for attention to ileostomy: Secondary | ICD-10-CM | POA: Diagnosis not present

## 2016-04-01 DIAGNOSIS — Z432 Encounter for attention to ileostomy: Secondary | ICD-10-CM | POA: Diagnosis not present

## 2016-04-01 DIAGNOSIS — I10 Essential (primary) hypertension: Secondary | ICD-10-CM | POA: Diagnosis not present

## 2016-04-01 DIAGNOSIS — C785 Secondary malignant neoplasm of large intestine and rectum: Secondary | ICD-10-CM | POA: Diagnosis not present

## 2016-04-01 DIAGNOSIS — J449 Chronic obstructive pulmonary disease, unspecified: Secondary | ICD-10-CM | POA: Diagnosis not present

## 2016-04-01 DIAGNOSIS — C569 Malignant neoplasm of unspecified ovary: Secondary | ICD-10-CM | POA: Diagnosis not present

## 2016-04-01 DIAGNOSIS — C786 Secondary malignant neoplasm of retroperitoneum and peritoneum: Secondary | ICD-10-CM | POA: Diagnosis not present

## 2016-04-01 DIAGNOSIS — Z483 Aftercare following surgery for neoplasm: Secondary | ICD-10-CM | POA: Diagnosis not present

## 2016-04-01 DIAGNOSIS — C7911 Secondary malignant neoplasm of bladder: Secondary | ICD-10-CM | POA: Diagnosis not present

## 2016-04-01 DIAGNOSIS — E119 Type 2 diabetes mellitus without complications: Secondary | ICD-10-CM | POA: Diagnosis not present

## 2016-04-02 DIAGNOSIS — Z483 Aftercare following surgery for neoplasm: Secondary | ICD-10-CM | POA: Diagnosis not present

## 2016-04-02 DIAGNOSIS — C785 Secondary malignant neoplasm of large intestine and rectum: Secondary | ICD-10-CM | POA: Diagnosis not present

## 2016-04-02 DIAGNOSIS — I10 Essential (primary) hypertension: Secondary | ICD-10-CM | POA: Diagnosis not present

## 2016-04-02 DIAGNOSIS — E119 Type 2 diabetes mellitus without complications: Secondary | ICD-10-CM | POA: Diagnosis not present

## 2016-04-02 DIAGNOSIS — J449 Chronic obstructive pulmonary disease, unspecified: Secondary | ICD-10-CM | POA: Diagnosis not present

## 2016-04-02 DIAGNOSIS — C786 Secondary malignant neoplasm of retroperitoneum and peritoneum: Secondary | ICD-10-CM | POA: Diagnosis not present

## 2016-04-02 DIAGNOSIS — Z432 Encounter for attention to ileostomy: Secondary | ICD-10-CM | POA: Diagnosis not present

## 2016-04-02 DIAGNOSIS — C7911 Secondary malignant neoplasm of bladder: Secondary | ICD-10-CM | POA: Diagnosis not present

## 2016-04-02 DIAGNOSIS — C569 Malignant neoplasm of unspecified ovary: Secondary | ICD-10-CM | POA: Diagnosis not present

## 2016-04-04 DIAGNOSIS — E119 Type 2 diabetes mellitus without complications: Secondary | ICD-10-CM | POA: Diagnosis not present

## 2016-04-04 DIAGNOSIS — L03311 Cellulitis of abdominal wall: Secondary | ICD-10-CM | POA: Diagnosis not present

## 2016-04-04 DIAGNOSIS — K660 Peritoneal adhesions (postprocedural) (postinfection): Secondary | ICD-10-CM | POA: Diagnosis not present

## 2016-04-04 DIAGNOSIS — J9811 Atelectasis: Secondary | ICD-10-CM | POA: Diagnosis not present

## 2016-04-04 DIAGNOSIS — Z8542 Personal history of malignant neoplasm of other parts of uterus: Secondary | ICD-10-CM | POA: Diagnosis not present

## 2016-04-04 DIAGNOSIS — K9189 Other postprocedural complications and disorders of digestive system: Secondary | ICD-10-CM | POA: Diagnosis not present

## 2016-04-04 DIAGNOSIS — N179 Acute kidney failure, unspecified: Secondary | ICD-10-CM | POA: Diagnosis not present

## 2016-04-04 DIAGNOSIS — I4581 Long QT syndrome: Secondary | ICD-10-CM | POA: Diagnosis not present

## 2016-04-04 DIAGNOSIS — J449 Chronic obstructive pulmonary disease, unspecified: Secondary | ICD-10-CM | POA: Diagnosis not present

## 2016-04-04 DIAGNOSIS — R001 Bradycardia, unspecified: Secondary | ICD-10-CM | POA: Diagnosis not present

## 2016-04-04 DIAGNOSIS — C569 Malignant neoplasm of unspecified ovary: Secondary | ICD-10-CM | POA: Diagnosis not present

## 2016-04-04 DIAGNOSIS — Z432 Encounter for attention to ileostomy: Secondary | ICD-10-CM | POA: Diagnosis not present

## 2016-04-04 DIAGNOSIS — R140 Abdominal distension (gaseous): Secondary | ICD-10-CM | POA: Diagnosis not present

## 2016-04-04 DIAGNOSIS — K567 Ileus, unspecified: Secondary | ICD-10-CM | POA: Diagnosis not present

## 2016-04-04 DIAGNOSIS — Z932 Ileostomy status: Secondary | ICD-10-CM | POA: Diagnosis not present

## 2016-04-16 DIAGNOSIS — C785 Secondary malignant neoplasm of large intestine and rectum: Secondary | ICD-10-CM | POA: Diagnosis not present

## 2016-04-16 DIAGNOSIS — I10 Essential (primary) hypertension: Secondary | ICD-10-CM | POA: Diagnosis not present

## 2016-04-16 DIAGNOSIS — E119 Type 2 diabetes mellitus without complications: Secondary | ICD-10-CM | POA: Diagnosis not present

## 2016-04-16 DIAGNOSIS — C786 Secondary malignant neoplasm of retroperitoneum and peritoneum: Secondary | ICD-10-CM | POA: Diagnosis not present

## 2016-04-16 DIAGNOSIS — C7911 Secondary malignant neoplasm of bladder: Secondary | ICD-10-CM | POA: Diagnosis not present

## 2016-04-16 DIAGNOSIS — J449 Chronic obstructive pulmonary disease, unspecified: Secondary | ICD-10-CM | POA: Diagnosis not present

## 2016-04-16 DIAGNOSIS — C569 Malignant neoplasm of unspecified ovary: Secondary | ICD-10-CM | POA: Diagnosis not present

## 2016-04-16 DIAGNOSIS — Z432 Encounter for attention to ileostomy: Secondary | ICD-10-CM | POA: Diagnosis not present

## 2016-04-16 DIAGNOSIS — Z483 Aftercare following surgery for neoplasm: Secondary | ICD-10-CM | POA: Diagnosis not present

## 2016-04-18 DIAGNOSIS — T814XXD Infection following a procedure, subsequent encounter: Secondary | ICD-10-CM | POA: Diagnosis not present

## 2016-04-21 DIAGNOSIS — C785 Secondary malignant neoplasm of large intestine and rectum: Secondary | ICD-10-CM | POA: Diagnosis not present

## 2016-04-21 DIAGNOSIS — I10 Essential (primary) hypertension: Secondary | ICD-10-CM | POA: Diagnosis not present

## 2016-04-21 DIAGNOSIS — Z483 Aftercare following surgery for neoplasm: Secondary | ICD-10-CM | POA: Diagnosis not present

## 2016-04-21 DIAGNOSIS — E119 Type 2 diabetes mellitus without complications: Secondary | ICD-10-CM | POA: Diagnosis not present

## 2016-04-21 DIAGNOSIS — C7911 Secondary malignant neoplasm of bladder: Secondary | ICD-10-CM | POA: Diagnosis not present

## 2016-04-21 DIAGNOSIS — C786 Secondary malignant neoplasm of retroperitoneum and peritoneum: Secondary | ICD-10-CM | POA: Diagnosis not present

## 2016-04-21 DIAGNOSIS — C569 Malignant neoplasm of unspecified ovary: Secondary | ICD-10-CM | POA: Diagnosis not present

## 2016-04-21 DIAGNOSIS — J449 Chronic obstructive pulmonary disease, unspecified: Secondary | ICD-10-CM | POA: Diagnosis not present

## 2016-04-21 DIAGNOSIS — Z432 Encounter for attention to ileostomy: Secondary | ICD-10-CM | POA: Diagnosis not present

## 2016-04-23 DIAGNOSIS — C569 Malignant neoplasm of unspecified ovary: Secondary | ICD-10-CM | POA: Diagnosis not present

## 2016-04-23 DIAGNOSIS — I10 Essential (primary) hypertension: Secondary | ICD-10-CM | POA: Diagnosis not present

## 2016-04-23 DIAGNOSIS — C7911 Secondary malignant neoplasm of bladder: Secondary | ICD-10-CM | POA: Diagnosis not present

## 2016-04-23 DIAGNOSIS — Z483 Aftercare following surgery for neoplasm: Secondary | ICD-10-CM | POA: Diagnosis not present

## 2016-04-23 DIAGNOSIS — Z432 Encounter for attention to ileostomy: Secondary | ICD-10-CM | POA: Diagnosis not present

## 2016-04-23 DIAGNOSIS — J449 Chronic obstructive pulmonary disease, unspecified: Secondary | ICD-10-CM | POA: Diagnosis not present

## 2016-04-23 DIAGNOSIS — C786 Secondary malignant neoplasm of retroperitoneum and peritoneum: Secondary | ICD-10-CM | POA: Diagnosis not present

## 2016-04-23 DIAGNOSIS — E119 Type 2 diabetes mellitus without complications: Secondary | ICD-10-CM | POA: Diagnosis not present

## 2016-04-23 DIAGNOSIS — C785 Secondary malignant neoplasm of large intestine and rectum: Secondary | ICD-10-CM | POA: Diagnosis not present

## 2016-04-24 DIAGNOSIS — E119 Type 2 diabetes mellitus without complications: Secondary | ICD-10-CM | POA: Diagnosis not present

## 2016-04-24 DIAGNOSIS — R0602 Shortness of breath: Secondary | ICD-10-CM | POA: Diagnosis not present

## 2016-04-24 DIAGNOSIS — I10 Essential (primary) hypertension: Secondary | ICD-10-CM | POA: Diagnosis not present

## 2016-04-24 DIAGNOSIS — J431 Panlobular emphysema: Secondary | ICD-10-CM | POA: Diagnosis not present

## 2016-04-24 DIAGNOSIS — E538 Deficiency of other specified B group vitamins: Secondary | ICD-10-CM | POA: Diagnosis not present

## 2016-04-24 DIAGNOSIS — N390 Urinary tract infection, site not specified: Secondary | ICD-10-CM | POA: Diagnosis not present

## 2016-04-24 DIAGNOSIS — E7800 Pure hypercholesterolemia, unspecified: Secondary | ICD-10-CM | POA: Diagnosis not present

## 2016-04-29 DIAGNOSIS — E119 Type 2 diabetes mellitus without complications: Secondary | ICD-10-CM | POA: Diagnosis not present

## 2016-04-29 DIAGNOSIS — C7911 Secondary malignant neoplasm of bladder: Secondary | ICD-10-CM | POA: Diagnosis not present

## 2016-04-29 DIAGNOSIS — L03311 Cellulitis of abdominal wall: Secondary | ICD-10-CM | POA: Diagnosis not present

## 2016-04-29 DIAGNOSIS — C541 Malignant neoplasm of endometrium: Secondary | ICD-10-CM | POA: Diagnosis not present

## 2016-04-29 DIAGNOSIS — C569 Malignant neoplasm of unspecified ovary: Secondary | ICD-10-CM | POA: Diagnosis not present

## 2016-04-29 DIAGNOSIS — C785 Secondary malignant neoplasm of large intestine and rectum: Secondary | ICD-10-CM | POA: Diagnosis not present

## 2016-04-29 DIAGNOSIS — I10 Essential (primary) hypertension: Secondary | ICD-10-CM | POA: Diagnosis not present

## 2016-04-29 DIAGNOSIS — T814XXA Infection following a procedure, initial encounter: Secondary | ICD-10-CM | POA: Diagnosis not present

## 2016-04-29 DIAGNOSIS — C786 Secondary malignant neoplasm of retroperitoneum and peritoneum: Secondary | ICD-10-CM | POA: Diagnosis not present

## 2016-05-01 DIAGNOSIS — E119 Type 2 diabetes mellitus without complications: Secondary | ICD-10-CM | POA: Diagnosis not present

## 2016-05-01 DIAGNOSIS — C785 Secondary malignant neoplasm of large intestine and rectum: Secondary | ICD-10-CM | POA: Diagnosis not present

## 2016-05-01 DIAGNOSIS — I10 Essential (primary) hypertension: Secondary | ICD-10-CM | POA: Diagnosis not present

## 2016-05-01 DIAGNOSIS — L03311 Cellulitis of abdominal wall: Secondary | ICD-10-CM | POA: Diagnosis not present

## 2016-05-01 DIAGNOSIS — C786 Secondary malignant neoplasm of retroperitoneum and peritoneum: Secondary | ICD-10-CM | POA: Diagnosis not present

## 2016-05-01 DIAGNOSIS — C569 Malignant neoplasm of unspecified ovary: Secondary | ICD-10-CM | POA: Diagnosis not present

## 2016-05-01 DIAGNOSIS — T814XXA Infection following a procedure, initial encounter: Secondary | ICD-10-CM | POA: Diagnosis not present

## 2016-05-01 DIAGNOSIS — C541 Malignant neoplasm of endometrium: Secondary | ICD-10-CM | POA: Diagnosis not present

## 2016-05-01 DIAGNOSIS — C7911 Secondary malignant neoplasm of bladder: Secondary | ICD-10-CM | POA: Diagnosis not present

## 2016-05-02 DIAGNOSIS — C785 Secondary malignant neoplasm of large intestine and rectum: Secondary | ICD-10-CM | POA: Diagnosis not present

## 2016-05-02 DIAGNOSIS — E119 Type 2 diabetes mellitus without complications: Secondary | ICD-10-CM | POA: Diagnosis not present

## 2016-05-02 DIAGNOSIS — C786 Secondary malignant neoplasm of retroperitoneum and peritoneum: Secondary | ICD-10-CM | POA: Diagnosis not present

## 2016-05-02 DIAGNOSIS — C7911 Secondary malignant neoplasm of bladder: Secondary | ICD-10-CM | POA: Diagnosis not present

## 2016-05-02 DIAGNOSIS — L03311 Cellulitis of abdominal wall: Secondary | ICD-10-CM | POA: Diagnosis not present

## 2016-05-02 DIAGNOSIS — T814XXA Infection following a procedure, initial encounter: Secondary | ICD-10-CM | POA: Diagnosis not present

## 2016-05-02 DIAGNOSIS — C541 Malignant neoplasm of endometrium: Secondary | ICD-10-CM | POA: Diagnosis not present

## 2016-05-02 DIAGNOSIS — I10 Essential (primary) hypertension: Secondary | ICD-10-CM | POA: Diagnosis not present

## 2016-05-02 DIAGNOSIS — C569 Malignant neoplasm of unspecified ovary: Secondary | ICD-10-CM | POA: Diagnosis not present

## 2016-05-07 DIAGNOSIS — C785 Secondary malignant neoplasm of large intestine and rectum: Secondary | ICD-10-CM | POA: Diagnosis not present

## 2016-05-07 DIAGNOSIS — L03311 Cellulitis of abdominal wall: Secondary | ICD-10-CM | POA: Diagnosis not present

## 2016-05-07 DIAGNOSIS — C786 Secondary malignant neoplasm of retroperitoneum and peritoneum: Secondary | ICD-10-CM | POA: Diagnosis not present

## 2016-05-07 DIAGNOSIS — C569 Malignant neoplasm of unspecified ovary: Secondary | ICD-10-CM | POA: Diagnosis not present

## 2016-05-07 DIAGNOSIS — C541 Malignant neoplasm of endometrium: Secondary | ICD-10-CM | POA: Diagnosis not present

## 2016-05-07 DIAGNOSIS — E119 Type 2 diabetes mellitus without complications: Secondary | ICD-10-CM | POA: Diagnosis not present

## 2016-05-07 DIAGNOSIS — I10 Essential (primary) hypertension: Secondary | ICD-10-CM | POA: Diagnosis not present

## 2016-05-07 DIAGNOSIS — T814XXA Infection following a procedure, initial encounter: Secondary | ICD-10-CM | POA: Diagnosis not present

## 2016-05-07 DIAGNOSIS — C7911 Secondary malignant neoplasm of bladder: Secondary | ICD-10-CM | POA: Diagnosis not present

## 2016-05-12 DIAGNOSIS — I10 Essential (primary) hypertension: Secondary | ICD-10-CM | POA: Diagnosis not present

## 2016-05-12 DIAGNOSIS — L03311 Cellulitis of abdominal wall: Secondary | ICD-10-CM | POA: Diagnosis not present

## 2016-05-12 DIAGNOSIS — C786 Secondary malignant neoplasm of retroperitoneum and peritoneum: Secondary | ICD-10-CM | POA: Diagnosis not present

## 2016-05-12 DIAGNOSIS — E119 Type 2 diabetes mellitus without complications: Secondary | ICD-10-CM | POA: Diagnosis not present

## 2016-05-12 DIAGNOSIS — C7911 Secondary malignant neoplasm of bladder: Secondary | ICD-10-CM | POA: Diagnosis not present

## 2016-05-12 DIAGNOSIS — C541 Malignant neoplasm of endometrium: Secondary | ICD-10-CM | POA: Diagnosis not present

## 2016-05-12 DIAGNOSIS — C785 Secondary malignant neoplasm of large intestine and rectum: Secondary | ICD-10-CM | POA: Diagnosis not present

## 2016-05-12 DIAGNOSIS — T814XXA Infection following a procedure, initial encounter: Secondary | ICD-10-CM | POA: Diagnosis not present

## 2016-05-12 DIAGNOSIS — C569 Malignant neoplasm of unspecified ovary: Secondary | ICD-10-CM | POA: Diagnosis not present

## 2016-05-14 DIAGNOSIS — C541 Malignant neoplasm of endometrium: Secondary | ICD-10-CM | POA: Diagnosis not present

## 2016-05-14 DIAGNOSIS — L03311 Cellulitis of abdominal wall: Secondary | ICD-10-CM | POA: Diagnosis not present

## 2016-05-14 DIAGNOSIS — C7911 Secondary malignant neoplasm of bladder: Secondary | ICD-10-CM | POA: Diagnosis not present

## 2016-05-14 DIAGNOSIS — T814XXA Infection following a procedure, initial encounter: Secondary | ICD-10-CM | POA: Diagnosis not present

## 2016-05-14 DIAGNOSIS — C569 Malignant neoplasm of unspecified ovary: Secondary | ICD-10-CM | POA: Diagnosis not present

## 2016-05-14 DIAGNOSIS — C785 Secondary malignant neoplasm of large intestine and rectum: Secondary | ICD-10-CM | POA: Diagnosis not present

## 2016-05-14 DIAGNOSIS — E119 Type 2 diabetes mellitus without complications: Secondary | ICD-10-CM | POA: Diagnosis not present

## 2016-05-14 DIAGNOSIS — C786 Secondary malignant neoplasm of retroperitoneum and peritoneum: Secondary | ICD-10-CM | POA: Diagnosis not present

## 2016-05-14 DIAGNOSIS — I10 Essential (primary) hypertension: Secondary | ICD-10-CM | POA: Diagnosis not present

## 2016-05-19 DIAGNOSIS — C785 Secondary malignant neoplasm of large intestine and rectum: Secondary | ICD-10-CM | POA: Diagnosis not present

## 2016-05-19 DIAGNOSIS — C7911 Secondary malignant neoplasm of bladder: Secondary | ICD-10-CM | POA: Diagnosis not present

## 2016-05-19 DIAGNOSIS — T814XXA Infection following a procedure, initial encounter: Secondary | ICD-10-CM | POA: Diagnosis not present

## 2016-05-19 DIAGNOSIS — L03311 Cellulitis of abdominal wall: Secondary | ICD-10-CM | POA: Diagnosis not present

## 2016-05-19 DIAGNOSIS — C569 Malignant neoplasm of unspecified ovary: Secondary | ICD-10-CM | POA: Diagnosis not present

## 2016-05-19 DIAGNOSIS — C786 Secondary malignant neoplasm of retroperitoneum and peritoneum: Secondary | ICD-10-CM | POA: Diagnosis not present

## 2016-05-19 DIAGNOSIS — E119 Type 2 diabetes mellitus without complications: Secondary | ICD-10-CM | POA: Diagnosis not present

## 2016-05-19 DIAGNOSIS — I10 Essential (primary) hypertension: Secondary | ICD-10-CM | POA: Diagnosis not present

## 2016-05-19 DIAGNOSIS — C541 Malignant neoplasm of endometrium: Secondary | ICD-10-CM | POA: Diagnosis not present

## 2016-05-21 DIAGNOSIS — L03311 Cellulitis of abdominal wall: Secondary | ICD-10-CM | POA: Diagnosis not present

## 2016-05-21 DIAGNOSIS — E119 Type 2 diabetes mellitus without complications: Secondary | ICD-10-CM | POA: Diagnosis not present

## 2016-05-21 DIAGNOSIS — C785 Secondary malignant neoplasm of large intestine and rectum: Secondary | ICD-10-CM | POA: Diagnosis not present

## 2016-05-21 DIAGNOSIS — I10 Essential (primary) hypertension: Secondary | ICD-10-CM | POA: Diagnosis not present

## 2016-05-21 DIAGNOSIS — C541 Malignant neoplasm of endometrium: Secondary | ICD-10-CM | POA: Diagnosis not present

## 2016-05-21 DIAGNOSIS — T814XXA Infection following a procedure, initial encounter: Secondary | ICD-10-CM | POA: Diagnosis not present

## 2016-05-21 DIAGNOSIS — C569 Malignant neoplasm of unspecified ovary: Secondary | ICD-10-CM | POA: Diagnosis not present

## 2016-05-21 DIAGNOSIS — C7911 Secondary malignant neoplasm of bladder: Secondary | ICD-10-CM | POA: Diagnosis not present

## 2016-05-21 DIAGNOSIS — C786 Secondary malignant neoplasm of retroperitoneum and peritoneum: Secondary | ICD-10-CM | POA: Diagnosis not present

## 2016-05-22 DIAGNOSIS — E119 Type 2 diabetes mellitus without complications: Secondary | ICD-10-CM | POA: Diagnosis not present

## 2016-05-22 DIAGNOSIS — C786 Secondary malignant neoplasm of retroperitoneum and peritoneum: Secondary | ICD-10-CM | POA: Diagnosis not present

## 2016-05-22 DIAGNOSIS — C785 Secondary malignant neoplasm of large intestine and rectum: Secondary | ICD-10-CM | POA: Diagnosis not present

## 2016-05-22 DIAGNOSIS — C569 Malignant neoplasm of unspecified ovary: Secondary | ICD-10-CM | POA: Diagnosis not present

## 2016-05-22 DIAGNOSIS — H524 Presbyopia: Secondary | ICD-10-CM | POA: Diagnosis not present

## 2016-05-22 DIAGNOSIS — T814XXA Infection following a procedure, initial encounter: Secondary | ICD-10-CM | POA: Diagnosis not present

## 2016-05-22 DIAGNOSIS — L03311 Cellulitis of abdominal wall: Secondary | ICD-10-CM | POA: Diagnosis not present

## 2016-05-22 DIAGNOSIS — C7911 Secondary malignant neoplasm of bladder: Secondary | ICD-10-CM | POA: Diagnosis not present

## 2016-05-22 DIAGNOSIS — I10 Essential (primary) hypertension: Secondary | ICD-10-CM | POA: Diagnosis not present

## 2016-05-22 DIAGNOSIS — C541 Malignant neoplasm of endometrium: Secondary | ICD-10-CM | POA: Diagnosis not present

## 2016-05-26 DIAGNOSIS — K7689 Other specified diseases of liver: Secondary | ICD-10-CM | POA: Diagnosis not present

## 2016-05-26 DIAGNOSIS — I10 Essential (primary) hypertension: Secondary | ICD-10-CM | POA: Diagnosis not present

## 2016-05-26 DIAGNOSIS — R911 Solitary pulmonary nodule: Secondary | ICD-10-CM | POA: Diagnosis not present

## 2016-05-26 DIAGNOSIS — K219 Gastro-esophageal reflux disease without esophagitis: Secondary | ICD-10-CM | POA: Diagnosis not present

## 2016-05-26 DIAGNOSIS — Z8543 Personal history of malignant neoplasm of ovary: Secondary | ICD-10-CM | POA: Diagnosis not present

## 2016-05-26 DIAGNOSIS — Z9049 Acquired absence of other specified parts of digestive tract: Secondary | ICD-10-CM | POA: Diagnosis not present

## 2016-05-26 DIAGNOSIS — E1159 Type 2 diabetes mellitus with other circulatory complications: Secondary | ICD-10-CM | POA: Diagnosis not present

## 2016-05-26 DIAGNOSIS — Z08 Encounter for follow-up examination after completed treatment for malignant neoplasm: Secondary | ICD-10-CM | POA: Diagnosis not present

## 2016-05-26 DIAGNOSIS — E7800 Pure hypercholesterolemia, unspecified: Secondary | ICD-10-CM | POA: Diagnosis not present

## 2016-05-27 DIAGNOSIS — I10 Essential (primary) hypertension: Secondary | ICD-10-CM | POA: Diagnosis not present

## 2016-05-27 DIAGNOSIS — C7911 Secondary malignant neoplasm of bladder: Secondary | ICD-10-CM | POA: Diagnosis not present

## 2016-05-27 DIAGNOSIS — C569 Malignant neoplasm of unspecified ovary: Secondary | ICD-10-CM | POA: Diagnosis not present

## 2016-05-27 DIAGNOSIS — E119 Type 2 diabetes mellitus without complications: Secondary | ICD-10-CM | POA: Diagnosis not present

## 2016-05-27 DIAGNOSIS — C785 Secondary malignant neoplasm of large intestine and rectum: Secondary | ICD-10-CM | POA: Diagnosis not present

## 2016-05-27 DIAGNOSIS — C541 Malignant neoplasm of endometrium: Secondary | ICD-10-CM | POA: Diagnosis not present

## 2016-05-27 DIAGNOSIS — L03311 Cellulitis of abdominal wall: Secondary | ICD-10-CM | POA: Diagnosis not present

## 2016-05-27 DIAGNOSIS — T814XXA Infection following a procedure, initial encounter: Secondary | ICD-10-CM | POA: Diagnosis not present

## 2016-05-27 DIAGNOSIS — C786 Secondary malignant neoplasm of retroperitoneum and peritoneum: Secondary | ICD-10-CM | POA: Diagnosis not present

## 2016-05-29 DIAGNOSIS — E119 Type 2 diabetes mellitus without complications: Secondary | ICD-10-CM | POA: Diagnosis not present

## 2016-05-29 DIAGNOSIS — C541 Malignant neoplasm of endometrium: Secondary | ICD-10-CM | POA: Diagnosis not present

## 2016-05-29 DIAGNOSIS — I10 Essential (primary) hypertension: Secondary | ICD-10-CM | POA: Diagnosis not present

## 2016-05-29 DIAGNOSIS — C569 Malignant neoplasm of unspecified ovary: Secondary | ICD-10-CM | POA: Diagnosis not present

## 2016-05-29 DIAGNOSIS — C7911 Secondary malignant neoplasm of bladder: Secondary | ICD-10-CM | POA: Diagnosis not present

## 2016-05-29 DIAGNOSIS — C785 Secondary malignant neoplasm of large intestine and rectum: Secondary | ICD-10-CM | POA: Diagnosis not present

## 2016-05-29 DIAGNOSIS — C786 Secondary malignant neoplasm of retroperitoneum and peritoneum: Secondary | ICD-10-CM | POA: Diagnosis not present

## 2016-05-29 DIAGNOSIS — T814XXA Infection following a procedure, initial encounter: Secondary | ICD-10-CM | POA: Diagnosis not present

## 2016-05-29 DIAGNOSIS — L03311 Cellulitis of abdominal wall: Secondary | ICD-10-CM | POA: Diagnosis not present

## 2016-05-30 DIAGNOSIS — L03311 Cellulitis of abdominal wall: Secondary | ICD-10-CM | POA: Diagnosis not present

## 2016-05-30 DIAGNOSIS — C7911 Secondary malignant neoplasm of bladder: Secondary | ICD-10-CM | POA: Diagnosis not present

## 2016-05-30 DIAGNOSIS — C569 Malignant neoplasm of unspecified ovary: Secondary | ICD-10-CM | POA: Diagnosis not present

## 2016-05-30 DIAGNOSIS — C786 Secondary malignant neoplasm of retroperitoneum and peritoneum: Secondary | ICD-10-CM | POA: Diagnosis not present

## 2016-05-30 DIAGNOSIS — I10 Essential (primary) hypertension: Secondary | ICD-10-CM | POA: Diagnosis not present

## 2016-05-30 DIAGNOSIS — C541 Malignant neoplasm of endometrium: Secondary | ICD-10-CM | POA: Diagnosis not present

## 2016-05-30 DIAGNOSIS — T814XXA Infection following a procedure, initial encounter: Secondary | ICD-10-CM | POA: Diagnosis not present

## 2016-05-30 DIAGNOSIS — E119 Type 2 diabetes mellitus without complications: Secondary | ICD-10-CM | POA: Diagnosis not present

## 2016-05-30 DIAGNOSIS — C785 Secondary malignant neoplasm of large intestine and rectum: Secondary | ICD-10-CM | POA: Diagnosis not present

## 2016-06-05 DIAGNOSIS — T814XXA Infection following a procedure, initial encounter: Secondary | ICD-10-CM | POA: Diagnosis not present

## 2016-06-05 DIAGNOSIS — C785 Secondary malignant neoplasm of large intestine and rectum: Secondary | ICD-10-CM | POA: Diagnosis not present

## 2016-06-05 DIAGNOSIS — I10 Essential (primary) hypertension: Secondary | ICD-10-CM | POA: Diagnosis not present

## 2016-06-05 DIAGNOSIS — C786 Secondary malignant neoplasm of retroperitoneum and peritoneum: Secondary | ICD-10-CM | POA: Diagnosis not present

## 2016-06-05 DIAGNOSIS — C569 Malignant neoplasm of unspecified ovary: Secondary | ICD-10-CM | POA: Diagnosis not present

## 2016-06-05 DIAGNOSIS — L03311 Cellulitis of abdominal wall: Secondary | ICD-10-CM | POA: Diagnosis not present

## 2016-06-05 DIAGNOSIS — E119 Type 2 diabetes mellitus without complications: Secondary | ICD-10-CM | POA: Diagnosis not present

## 2016-06-05 DIAGNOSIS — C541 Malignant neoplasm of endometrium: Secondary | ICD-10-CM | POA: Diagnosis not present

## 2016-06-05 DIAGNOSIS — C7911 Secondary malignant neoplasm of bladder: Secondary | ICD-10-CM | POA: Diagnosis not present

## 2016-06-09 DIAGNOSIS — C569 Malignant neoplasm of unspecified ovary: Secondary | ICD-10-CM | POA: Diagnosis not present

## 2016-06-09 DIAGNOSIS — C785 Secondary malignant neoplasm of large intestine and rectum: Secondary | ICD-10-CM | POA: Diagnosis not present

## 2016-06-09 DIAGNOSIS — C786 Secondary malignant neoplasm of retroperitoneum and peritoneum: Secondary | ICD-10-CM | POA: Diagnosis not present

## 2016-06-09 DIAGNOSIS — E119 Type 2 diabetes mellitus without complications: Secondary | ICD-10-CM | POA: Diagnosis not present

## 2016-06-09 DIAGNOSIS — L03311 Cellulitis of abdominal wall: Secondary | ICD-10-CM | POA: Diagnosis not present

## 2016-06-09 DIAGNOSIS — C7911 Secondary malignant neoplasm of bladder: Secondary | ICD-10-CM | POA: Diagnosis not present

## 2016-06-09 DIAGNOSIS — C541 Malignant neoplasm of endometrium: Secondary | ICD-10-CM | POA: Diagnosis not present

## 2016-06-09 DIAGNOSIS — I10 Essential (primary) hypertension: Secondary | ICD-10-CM | POA: Diagnosis not present

## 2016-06-09 DIAGNOSIS — T814XXA Infection following a procedure, initial encounter: Secondary | ICD-10-CM | POA: Diagnosis not present

## 2016-06-12 DIAGNOSIS — I10 Essential (primary) hypertension: Secondary | ICD-10-CM | POA: Diagnosis not present

## 2016-06-12 DIAGNOSIS — C7911 Secondary malignant neoplasm of bladder: Secondary | ICD-10-CM | POA: Diagnosis not present

## 2016-06-12 DIAGNOSIS — C785 Secondary malignant neoplasm of large intestine and rectum: Secondary | ICD-10-CM | POA: Diagnosis not present

## 2016-06-12 DIAGNOSIS — L03311 Cellulitis of abdominal wall: Secondary | ICD-10-CM | POA: Diagnosis not present

## 2016-06-12 DIAGNOSIS — C569 Malignant neoplasm of unspecified ovary: Secondary | ICD-10-CM | POA: Diagnosis not present

## 2016-06-12 DIAGNOSIS — T814XXA Infection following a procedure, initial encounter: Secondary | ICD-10-CM | POA: Diagnosis not present

## 2016-06-12 DIAGNOSIS — C541 Malignant neoplasm of endometrium: Secondary | ICD-10-CM | POA: Diagnosis not present

## 2016-06-12 DIAGNOSIS — E119 Type 2 diabetes mellitus without complications: Secondary | ICD-10-CM | POA: Diagnosis not present

## 2016-06-12 DIAGNOSIS — C786 Secondary malignant neoplasm of retroperitoneum and peritoneum: Secondary | ICD-10-CM | POA: Diagnosis not present

## 2016-06-16 DIAGNOSIS — C569 Malignant neoplasm of unspecified ovary: Secondary | ICD-10-CM | POA: Diagnosis not present

## 2016-06-16 DIAGNOSIS — C541 Malignant neoplasm of endometrium: Secondary | ICD-10-CM | POA: Diagnosis not present

## 2016-06-16 DIAGNOSIS — E119 Type 2 diabetes mellitus without complications: Secondary | ICD-10-CM | POA: Diagnosis not present

## 2016-06-16 DIAGNOSIS — I10 Essential (primary) hypertension: Secondary | ICD-10-CM | POA: Diagnosis not present

## 2016-06-16 DIAGNOSIS — T814XXA Infection following a procedure, initial encounter: Secondary | ICD-10-CM | POA: Diagnosis not present

## 2016-06-16 DIAGNOSIS — L03311 Cellulitis of abdominal wall: Secondary | ICD-10-CM | POA: Diagnosis not present

## 2016-06-16 DIAGNOSIS — C785 Secondary malignant neoplasm of large intestine and rectum: Secondary | ICD-10-CM | POA: Diagnosis not present

## 2016-06-16 DIAGNOSIS — C7911 Secondary malignant neoplasm of bladder: Secondary | ICD-10-CM | POA: Diagnosis not present

## 2016-06-16 DIAGNOSIS — C786 Secondary malignant neoplasm of retroperitoneum and peritoneum: Secondary | ICD-10-CM | POA: Diagnosis not present

## 2016-06-18 DIAGNOSIS — C7911 Secondary malignant neoplasm of bladder: Secondary | ICD-10-CM | POA: Diagnosis not present

## 2016-06-18 DIAGNOSIS — T814XXA Infection following a procedure, initial encounter: Secondary | ICD-10-CM | POA: Diagnosis not present

## 2016-06-18 DIAGNOSIS — C541 Malignant neoplasm of endometrium: Secondary | ICD-10-CM | POA: Diagnosis not present

## 2016-06-18 DIAGNOSIS — C785 Secondary malignant neoplasm of large intestine and rectum: Secondary | ICD-10-CM | POA: Diagnosis not present

## 2016-06-18 DIAGNOSIS — I10 Essential (primary) hypertension: Secondary | ICD-10-CM | POA: Diagnosis not present

## 2016-06-18 DIAGNOSIS — E119 Type 2 diabetes mellitus without complications: Secondary | ICD-10-CM | POA: Diagnosis not present

## 2016-06-18 DIAGNOSIS — C569 Malignant neoplasm of unspecified ovary: Secondary | ICD-10-CM | POA: Diagnosis not present

## 2016-06-18 DIAGNOSIS — C786 Secondary malignant neoplasm of retroperitoneum and peritoneum: Secondary | ICD-10-CM | POA: Diagnosis not present

## 2016-06-18 DIAGNOSIS — L03311 Cellulitis of abdominal wall: Secondary | ICD-10-CM | POA: Diagnosis not present

## 2016-07-25 DIAGNOSIS — E7800 Pure hypercholesterolemia, unspecified: Secondary | ICD-10-CM | POA: Diagnosis not present

## 2016-07-25 DIAGNOSIS — M79621 Pain in right upper arm: Secondary | ICD-10-CM | POA: Diagnosis not present

## 2016-07-25 DIAGNOSIS — R0602 Shortness of breath: Secondary | ICD-10-CM | POA: Diagnosis not present

## 2016-07-25 DIAGNOSIS — J431 Panlobular emphysema: Secondary | ICD-10-CM | POA: Diagnosis not present

## 2016-07-25 DIAGNOSIS — Z79899 Other long term (current) drug therapy: Secondary | ICD-10-CM | POA: Diagnosis not present

## 2016-07-25 DIAGNOSIS — R300 Dysuria: Secondary | ICD-10-CM | POA: Diagnosis not present

## 2016-07-25 DIAGNOSIS — E119 Type 2 diabetes mellitus without complications: Secondary | ICD-10-CM | POA: Diagnosis not present

## 2016-07-25 DIAGNOSIS — M79601 Pain in right arm: Secondary | ICD-10-CM | POA: Diagnosis not present

## 2016-07-25 DIAGNOSIS — M25511 Pain in right shoulder: Secondary | ICD-10-CM | POA: Diagnosis not present

## 2016-07-25 DIAGNOSIS — I10 Essential (primary) hypertension: Secondary | ICD-10-CM | POA: Diagnosis not present

## 2016-07-25 DIAGNOSIS — M545 Low back pain: Secondary | ICD-10-CM | POA: Diagnosis not present

## 2016-07-28 DIAGNOSIS — Z6835 Body mass index (BMI) 35.0-35.9, adult: Secondary | ICD-10-CM | POA: Diagnosis not present

## 2016-07-28 DIAGNOSIS — Z9049 Acquired absence of other specified parts of digestive tract: Secondary | ICD-10-CM | POA: Diagnosis not present

## 2016-07-28 DIAGNOSIS — Z980 Intestinal bypass and anastomosis status: Secondary | ICD-10-CM | POA: Diagnosis not present

## 2016-07-28 DIAGNOSIS — Z90722 Acquired absence of ovaries, bilateral: Secondary | ICD-10-CM | POA: Diagnosis not present

## 2016-07-28 DIAGNOSIS — Z90710 Acquired absence of both cervix and uterus: Secondary | ICD-10-CM | POA: Diagnosis not present

## 2016-07-28 DIAGNOSIS — Z9221 Personal history of antineoplastic chemotherapy: Secondary | ICD-10-CM | POA: Diagnosis not present

## 2016-07-28 DIAGNOSIS — Z483 Aftercare following surgery for neoplasm: Secondary | ICD-10-CM | POA: Diagnosis not present

## 2016-07-28 DIAGNOSIS — C569 Malignant neoplasm of unspecified ovary: Secondary | ICD-10-CM | POA: Diagnosis not present

## 2016-07-28 DIAGNOSIS — Z8719 Personal history of other diseases of the digestive system: Secondary | ICD-10-CM | POA: Diagnosis not present

## 2016-07-28 DIAGNOSIS — Z8543 Personal history of malignant neoplasm of ovary: Secondary | ICD-10-CM | POA: Diagnosis not present

## 2016-07-29 DIAGNOSIS — H25043 Posterior subcapsular polar age-related cataract, bilateral: Secondary | ICD-10-CM | POA: Diagnosis not present

## 2016-07-29 DIAGNOSIS — H25013 Cortical age-related cataract, bilateral: Secondary | ICD-10-CM | POA: Diagnosis not present

## 2016-07-29 DIAGNOSIS — H18413 Arcus senilis, bilateral: Secondary | ICD-10-CM | POA: Diagnosis not present

## 2016-07-29 DIAGNOSIS — H2513 Age-related nuclear cataract, bilateral: Secondary | ICD-10-CM | POA: Diagnosis not present

## 2016-07-31 DIAGNOSIS — D2312 Other benign neoplasm of skin of left eyelid, including canthus: Secondary | ICD-10-CM | POA: Diagnosis not present

## 2016-07-31 DIAGNOSIS — D485 Neoplasm of uncertain behavior of skin: Secondary | ICD-10-CM | POA: Diagnosis not present

## 2016-08-04 DIAGNOSIS — C569 Malignant neoplasm of unspecified ovary: Secondary | ICD-10-CM | POA: Diagnosis not present

## 2016-08-04 DIAGNOSIS — Z79899 Other long term (current) drug therapy: Secondary | ICD-10-CM | POA: Diagnosis not present

## 2016-08-04 DIAGNOSIS — Z5111 Encounter for antineoplastic chemotherapy: Secondary | ICD-10-CM | POA: Diagnosis not present

## 2016-10-17 DIAGNOSIS — R197 Diarrhea, unspecified: Secondary | ICD-10-CM | POA: Diagnosis not present

## 2016-10-17 DIAGNOSIS — R112 Nausea with vomiting, unspecified: Secondary | ICD-10-CM | POA: Diagnosis not present

## 2016-10-27 DIAGNOSIS — Z79899 Other long term (current) drug therapy: Secondary | ICD-10-CM | POA: Diagnosis not present

## 2016-10-27 DIAGNOSIS — R0602 Shortness of breath: Secondary | ICD-10-CM | POA: Diagnosis not present

## 2016-10-27 DIAGNOSIS — J431 Panlobular emphysema: Secondary | ICD-10-CM | POA: Diagnosis not present

## 2016-10-27 DIAGNOSIS — E119 Type 2 diabetes mellitus without complications: Secondary | ICD-10-CM | POA: Diagnosis not present

## 2016-10-27 DIAGNOSIS — Z0000 Encounter for general adult medical examination without abnormal findings: Secondary | ICD-10-CM | POA: Diagnosis not present

## 2016-10-27 DIAGNOSIS — I10 Essential (primary) hypertension: Secondary | ICD-10-CM | POA: Diagnosis not present

## 2016-10-27 DIAGNOSIS — E7800 Pure hypercholesterolemia, unspecified: Secondary | ICD-10-CM | POA: Diagnosis not present

## 2016-10-27 DIAGNOSIS — Z1231 Encounter for screening mammogram for malignant neoplasm of breast: Secondary | ICD-10-CM | POA: Diagnosis not present

## 2016-11-03 DIAGNOSIS — M79601 Pain in right arm: Secondary | ICD-10-CM | POA: Diagnosis not present

## 2016-11-03 DIAGNOSIS — M549 Dorsalgia, unspecified: Secondary | ICD-10-CM | POA: Diagnosis not present

## 2016-11-03 DIAGNOSIS — M25519 Pain in unspecified shoulder: Secondary | ICD-10-CM | POA: Diagnosis not present

## 2016-11-03 DIAGNOSIS — M7989 Other specified soft tissue disorders: Secondary | ICD-10-CM | POA: Diagnosis not present

## 2016-11-03 DIAGNOSIS — R200 Anesthesia of skin: Secondary | ICD-10-CM | POA: Diagnosis not present

## 2016-11-03 DIAGNOSIS — C569 Malignant neoplasm of unspecified ovary: Secondary | ICD-10-CM | POA: Diagnosis not present

## 2016-11-03 DIAGNOSIS — M79602 Pain in left arm: Secondary | ICD-10-CM | POA: Diagnosis not present

## 2016-11-03 DIAGNOSIS — I10 Essential (primary) hypertension: Secondary | ICD-10-CM | POA: Diagnosis not present

## 2016-11-03 DIAGNOSIS — Z87891 Personal history of nicotine dependence: Secondary | ICD-10-CM | POA: Diagnosis not present

## 2016-11-03 DIAGNOSIS — Z6834 Body mass index (BMI) 34.0-34.9, adult: Secondary | ICD-10-CM | POA: Diagnosis not present

## 2016-11-05 DIAGNOSIS — M75101 Unspecified rotator cuff tear or rupture of right shoulder, not specified as traumatic: Secondary | ICD-10-CM | POA: Diagnosis not present

## 2016-11-05 DIAGNOSIS — M75102 Unspecified rotator cuff tear or rupture of left shoulder, not specified as traumatic: Secondary | ICD-10-CM | POA: Diagnosis not present

## 2016-11-05 DIAGNOSIS — S46811A Strain of other muscles, fascia and tendons at shoulder and upper arm level, right arm, initial encounter: Secondary | ICD-10-CM | POA: Diagnosis not present

## 2016-11-05 DIAGNOSIS — M25512 Pain in left shoulder: Secondary | ICD-10-CM | POA: Diagnosis not present

## 2016-11-13 ENCOUNTER — Other Ambulatory Visit: Payer: Self-pay | Admitting: Orthopedic Surgery

## 2016-11-13 DIAGNOSIS — M4722 Other spondylosis with radiculopathy, cervical region: Secondary | ICD-10-CM | POA: Diagnosis not present

## 2016-11-13 DIAGNOSIS — S46811A Strain of other muscles, fascia and tendons at shoulder and upper arm level, right arm, initial encounter: Secondary | ICD-10-CM | POA: Diagnosis not present

## 2016-11-13 DIAGNOSIS — M5412 Radiculopathy, cervical region: Secondary | ICD-10-CM

## 2016-11-13 DIAGNOSIS — M5030 Other cervical disc degeneration, unspecified cervical region: Secondary | ICD-10-CM | POA: Diagnosis not present

## 2016-11-13 DIAGNOSIS — M542 Cervicalgia: Secondary | ICD-10-CM | POA: Diagnosis not present

## 2016-11-20 ENCOUNTER — Ambulatory Visit
Admission: RE | Admit: 2016-11-20 | Discharge: 2016-11-20 | Disposition: A | Discharge status: Home / Self Care | Payer: Medicare HMO | Source: Ambulatory Visit | Attending: Orthopedic Surgery | Admitting: Orthopedic Surgery

## 2016-11-20 DIAGNOSIS — M5030 Other cervical disc degeneration, unspecified cervical region: Secondary | ICD-10-CM

## 2016-11-20 DIAGNOSIS — M4802 Spinal stenosis, cervical region: Secondary | ICD-10-CM | POA: Insufficient documentation

## 2016-11-20 DIAGNOSIS — M5412 Radiculopathy, cervical region: Secondary | ICD-10-CM | POA: Diagnosis present

## 2016-11-20 DIAGNOSIS — M5010 Cervical disc disorder with radiculopathy, unspecified cervical region: Secondary | ICD-10-CM | POA: Diagnosis not present

## 2016-11-20 DIAGNOSIS — M47812 Spondylosis without myelopathy or radiculopathy, cervical region: Secondary | ICD-10-CM | POA: Diagnosis not present

## 2016-11-25 ENCOUNTER — Ambulatory Visit: Payer: Commercial Managed Care - HMO

## 2016-11-25 DIAGNOSIS — E119 Type 2 diabetes mellitus without complications: Secondary | ICD-10-CM | POA: Diagnosis not present

## 2016-11-27 DIAGNOSIS — Z1211 Encounter for screening for malignant neoplasm of colon: Secondary | ICD-10-CM | POA: Diagnosis not present

## 2016-12-03 ENCOUNTER — Other Ambulatory Visit: Payer: Self-pay | Admitting: Internal Medicine

## 2016-12-03 DIAGNOSIS — Z1231 Encounter for screening mammogram for malignant neoplasm of breast: Secondary | ICD-10-CM

## 2016-12-04 ENCOUNTER — Other Ambulatory Visit: Payer: Self-pay | Admitting: Orthopedic Surgery

## 2016-12-04 DIAGNOSIS — M5412 Radiculopathy, cervical region: Secondary | ICD-10-CM

## 2016-12-16 ENCOUNTER — Ambulatory Visit
Admission: RE | Admit: 2016-12-16 | Discharge: 2016-12-16 | Disposition: A | Discharge status: Home / Self Care | Payer: Commercial Managed Care - HMO | Source: Ambulatory Visit | Attending: Orthopedic Surgery | Admitting: Orthopedic Surgery

## 2016-12-16 DIAGNOSIS — M5412 Radiculopathy, cervical region: Secondary | ICD-10-CM | POA: Diagnosis not present

## 2016-12-16 MED ORDER — TRIAMCINOLONE ACETONIDE 40 MG/ML IJ SUSP (RADIOLOGY)
60.0000 mg | Freq: Once | INTRAMUSCULAR | Status: AC
  Administered 2016-12-16: 60 mg via EPIDURAL

## 2016-12-16 MED ORDER — IOPAMIDOL (ISOVUE-M 300) INJECTION 61%
1.0000 mL | Freq: Once | INTRAMUSCULAR | Status: AC | PRN
  Administered 2016-12-16: 1 mL via EPIDURAL

## 2016-12-16 NOTE — Discharge Instructions (Signed)

## 2017-01-20 DIAGNOSIS — I10 Essential (primary) hypertension: Secondary | ICD-10-CM | POA: Diagnosis not present

## 2017-01-20 DIAGNOSIS — Z0000 Encounter for general adult medical examination without abnormal findings: Secondary | ICD-10-CM | POA: Diagnosis not present

## 2017-01-20 DIAGNOSIS — E7800 Pure hypercholesterolemia, unspecified: Secondary | ICD-10-CM | POA: Diagnosis not present

## 2017-01-20 DIAGNOSIS — Z79899 Other long term (current) drug therapy: Secondary | ICD-10-CM | POA: Diagnosis not present

## 2017-01-20 DIAGNOSIS — E119 Type 2 diabetes mellitus without complications: Secondary | ICD-10-CM | POA: Diagnosis not present

## 2017-01-20 DIAGNOSIS — J441 Chronic obstructive pulmonary disease with (acute) exacerbation: Secondary | ICD-10-CM | POA: Diagnosis not present

## 2017-01-30 ENCOUNTER — Ambulatory Visit
Admission: RE | Admit: 2017-01-30 | Discharge: 2017-01-30 | Disposition: A | Discharge status: Home / Self Care | Payer: Commercial Managed Care - HMO | Source: Ambulatory Visit | Attending: Internal Medicine | Admitting: Internal Medicine

## 2017-01-30 DIAGNOSIS — Z1231 Encounter for screening mammogram for malignant neoplasm of breast: Secondary | ICD-10-CM | POA: Insufficient documentation

## 2017-02-06 DIAGNOSIS — E119 Type 2 diabetes mellitus without complications: Secondary | ICD-10-CM | POA: Diagnosis not present

## 2017-02-06 DIAGNOSIS — R05 Cough: Secondary | ICD-10-CM | POA: Diagnosis not present

## 2017-02-06 DIAGNOSIS — J431 Panlobular emphysema: Secondary | ICD-10-CM | POA: Diagnosis not present

## 2017-02-09 DIAGNOSIS — E7800 Pure hypercholesterolemia, unspecified: Secondary | ICD-10-CM | POA: Diagnosis not present

## 2017-02-09 DIAGNOSIS — Z6834 Body mass index (BMI) 34.0-34.9, adult: Secondary | ICD-10-CM | POA: Diagnosis not present

## 2017-02-09 DIAGNOSIS — C569 Malignant neoplasm of unspecified ovary: Secondary | ICD-10-CM | POA: Diagnosis not present

## 2017-02-09 DIAGNOSIS — Z79899 Other long term (current) drug therapy: Secondary | ICD-10-CM | POA: Diagnosis not present

## 2017-02-09 DIAGNOSIS — Z808 Family history of malignant neoplasm of other organs or systems: Secondary | ICD-10-CM | POA: Diagnosis not present

## 2017-02-09 DIAGNOSIS — Z5111 Encounter for antineoplastic chemotherapy: Secondary | ICD-10-CM | POA: Diagnosis not present

## 2017-02-09 DIAGNOSIS — E119 Type 2 diabetes mellitus without complications: Secondary | ICD-10-CM | POA: Diagnosis not present

## 2017-02-09 DIAGNOSIS — I10 Essential (primary) hypertension: Secondary | ICD-10-CM | POA: Diagnosis not present

## 2017-02-17 DIAGNOSIS — H25013 Cortical age-related cataract, bilateral: Secondary | ICD-10-CM | POA: Diagnosis not present

## 2017-02-17 DIAGNOSIS — H18413 Arcus senilis, bilateral: Secondary | ICD-10-CM | POA: Diagnosis not present

## 2017-02-17 DIAGNOSIS — H2511 Age-related nuclear cataract, right eye: Secondary | ICD-10-CM | POA: Diagnosis not present

## 2017-02-17 DIAGNOSIS — H2513 Age-related nuclear cataract, bilateral: Secondary | ICD-10-CM | POA: Diagnosis not present

## 2017-02-17 DIAGNOSIS — H25043 Posterior subcapsular polar age-related cataract, bilateral: Secondary | ICD-10-CM | POA: Diagnosis not present

## 2017-02-26 DIAGNOSIS — I10 Essential (primary) hypertension: Secondary | ICD-10-CM | POA: Diagnosis not present

## 2017-02-26 DIAGNOSIS — Z79899 Other long term (current) drug therapy: Secondary | ICD-10-CM | POA: Diagnosis not present

## 2017-02-26 DIAGNOSIS — E119 Type 2 diabetes mellitus without complications: Secondary | ICD-10-CM | POA: Diagnosis not present

## 2017-02-26 DIAGNOSIS — E7800 Pure hypercholesterolemia, unspecified: Secondary | ICD-10-CM | POA: Diagnosis not present

## 2017-02-27 ENCOUNTER — Other Ambulatory Visit: Payer: Self-pay | Admitting: Orthopedic Surgery

## 2017-02-27 DIAGNOSIS — M5412 Radiculopathy, cervical region: Secondary | ICD-10-CM

## 2017-03-02 DIAGNOSIS — C569 Malignant neoplasm of unspecified ovary: Secondary | ICD-10-CM | POA: Diagnosis not present

## 2017-03-02 DIAGNOSIS — Z5111 Encounter for antineoplastic chemotherapy: Secondary | ICD-10-CM | POA: Diagnosis not present

## 2017-03-04 DIAGNOSIS — E119 Type 2 diabetes mellitus without complications: Secondary | ICD-10-CM | POA: Diagnosis not present

## 2017-03-07 IMAGING — MG MM DIGITAL SCREENING BILAT W/ TOMO W/ CAD
8 of 12 series · 8 of 28 positions shown · non-contrast
Comparison: Previous exam(s).

CLINICAL DATA: Screening.

EXAM:
2D DIGITAL SCREENING BILATERAL MAMMOGRAM WITH CAD AND ADJUNCT TOMO

[L MLO]
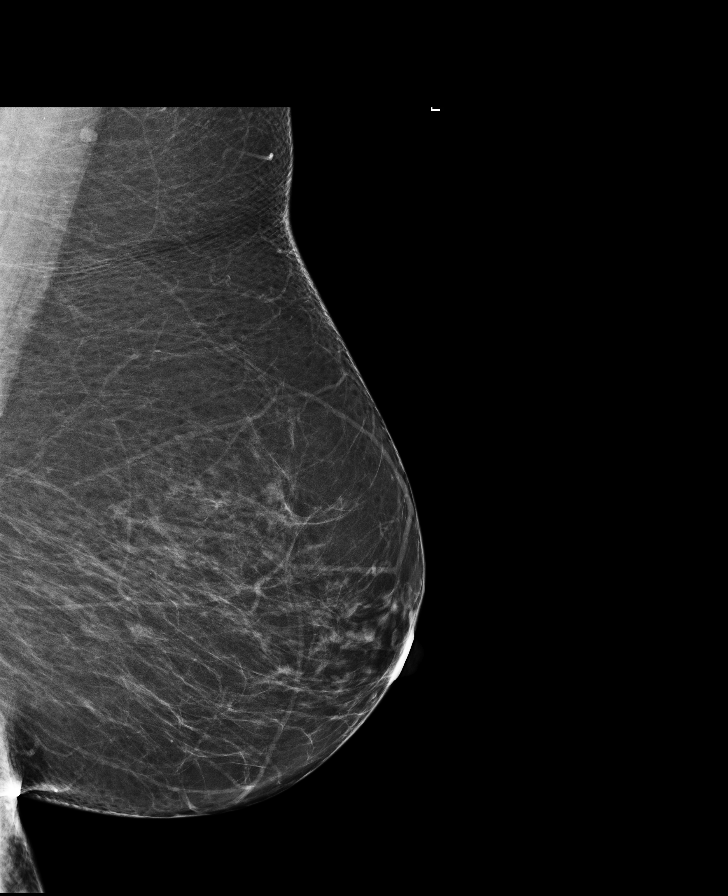

[R MLO]
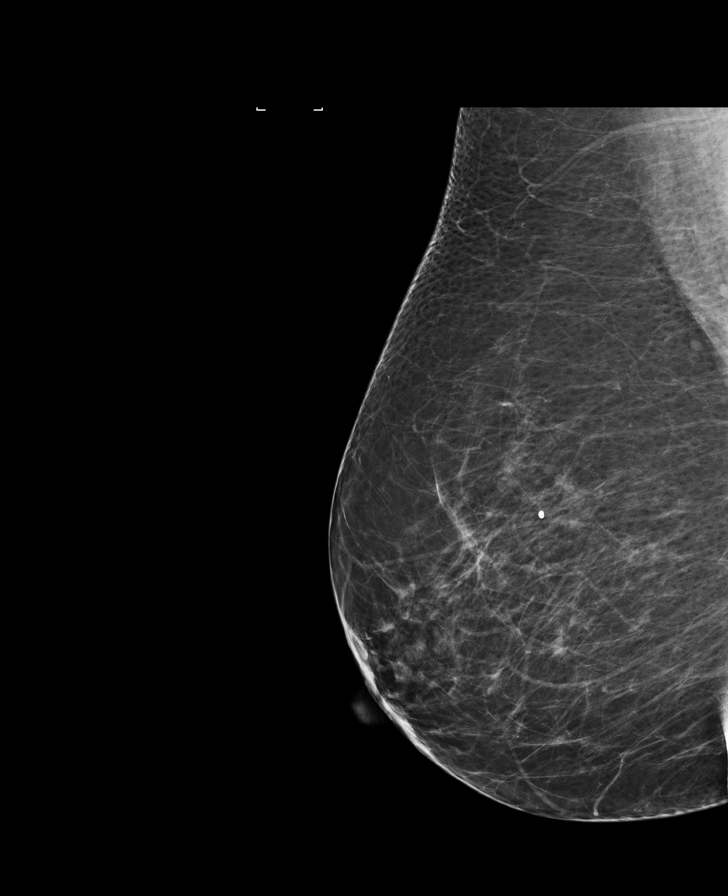

[L MLO synth-2D]
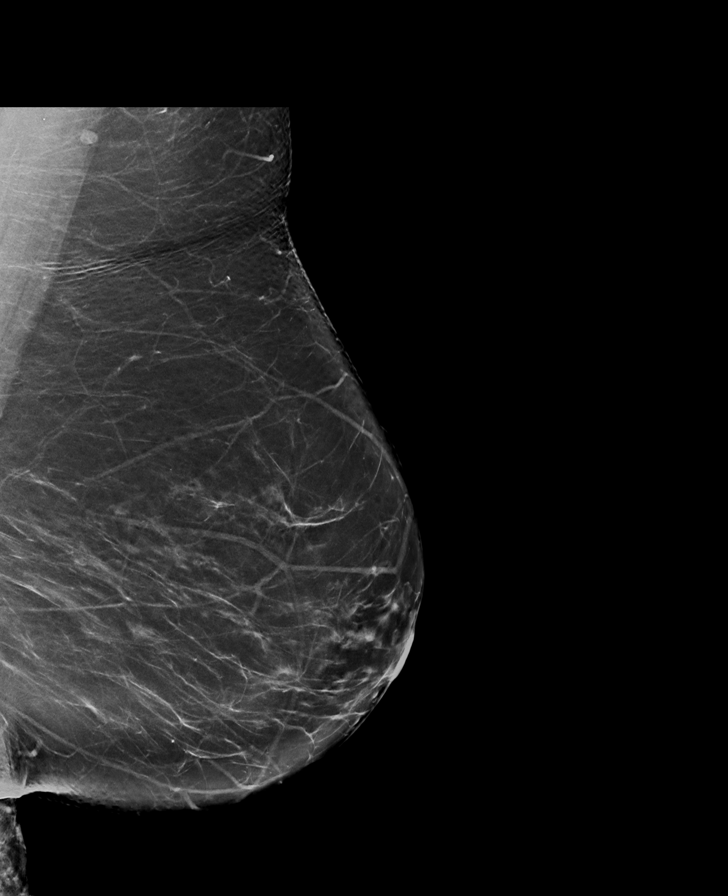

[R MLO synth-2D]
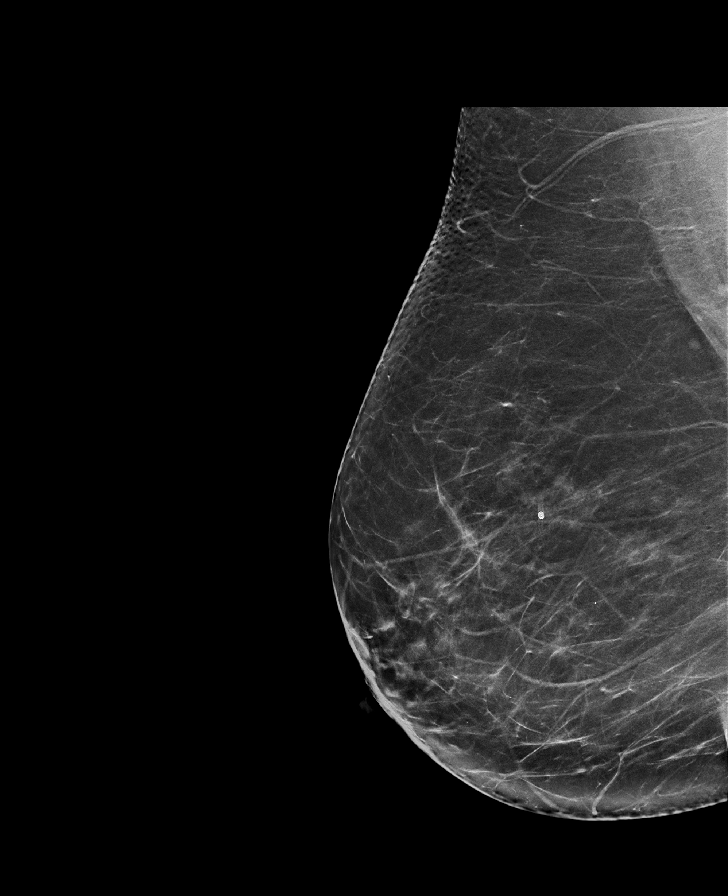

[L CC]
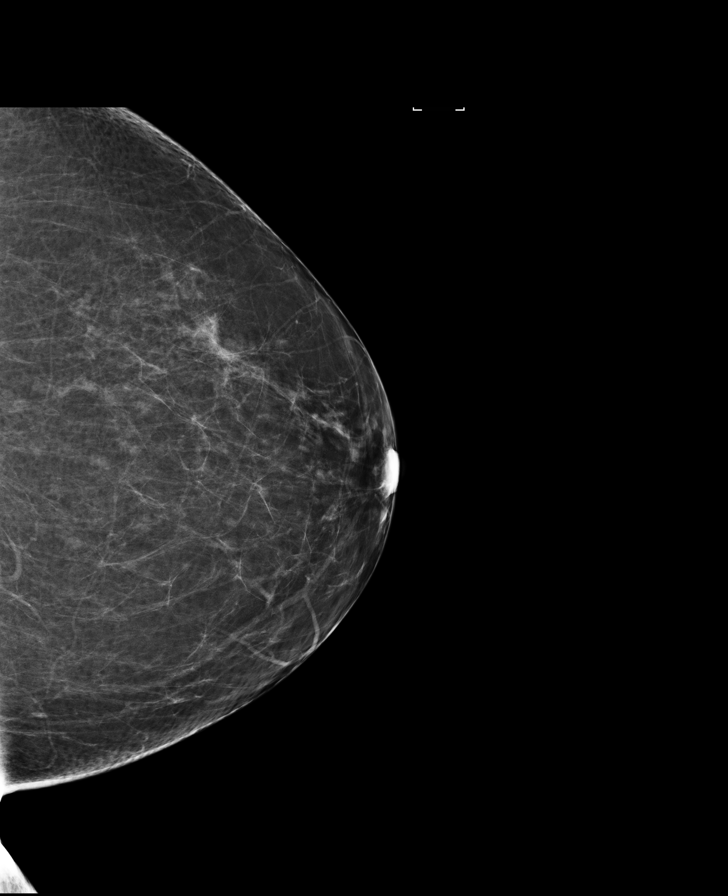

[L CC synth-2D]
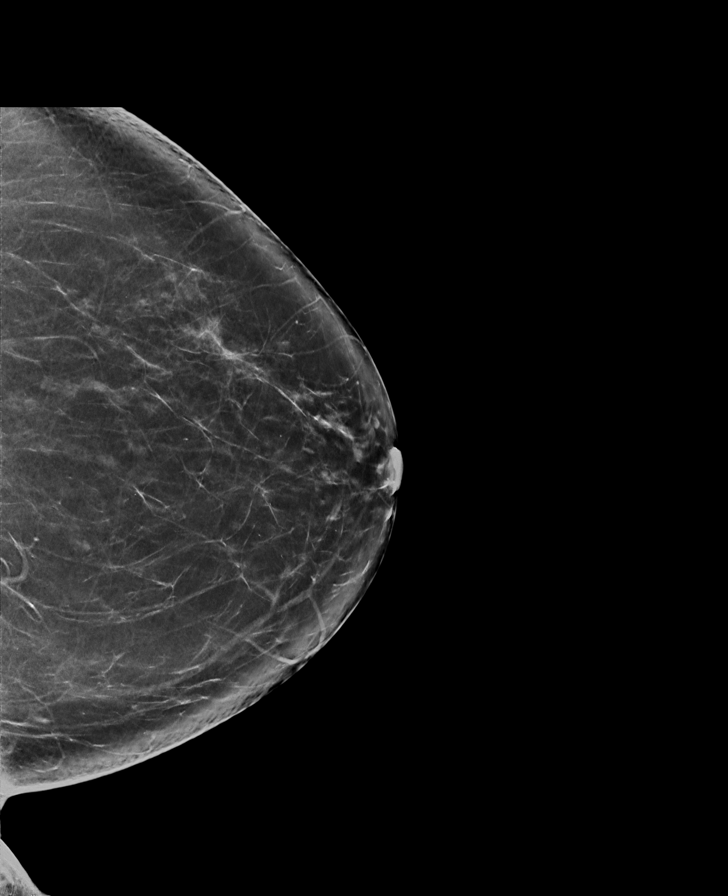

[R CC]
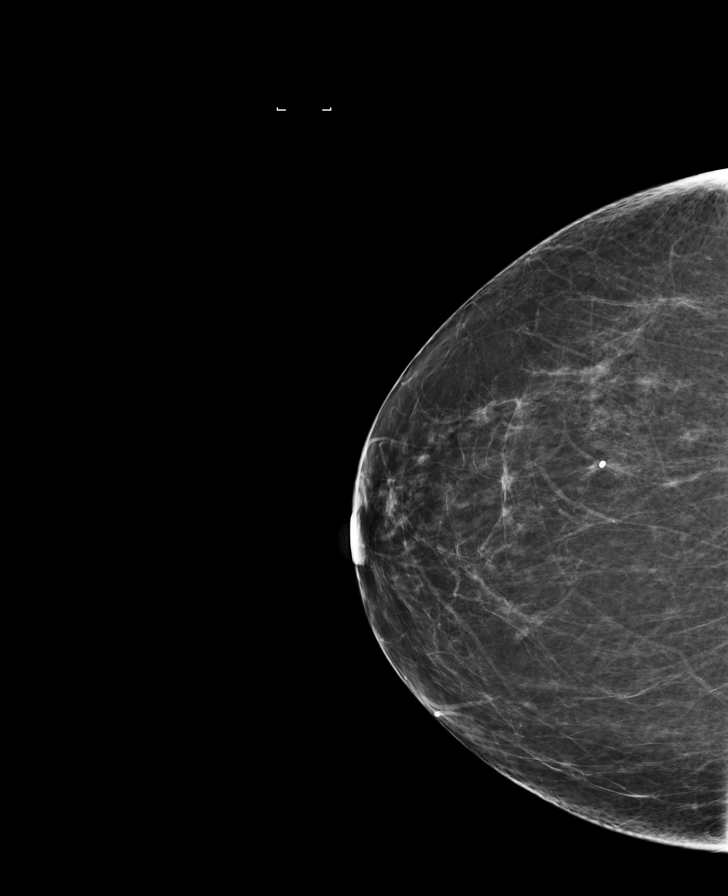

[R CC synth-2D]
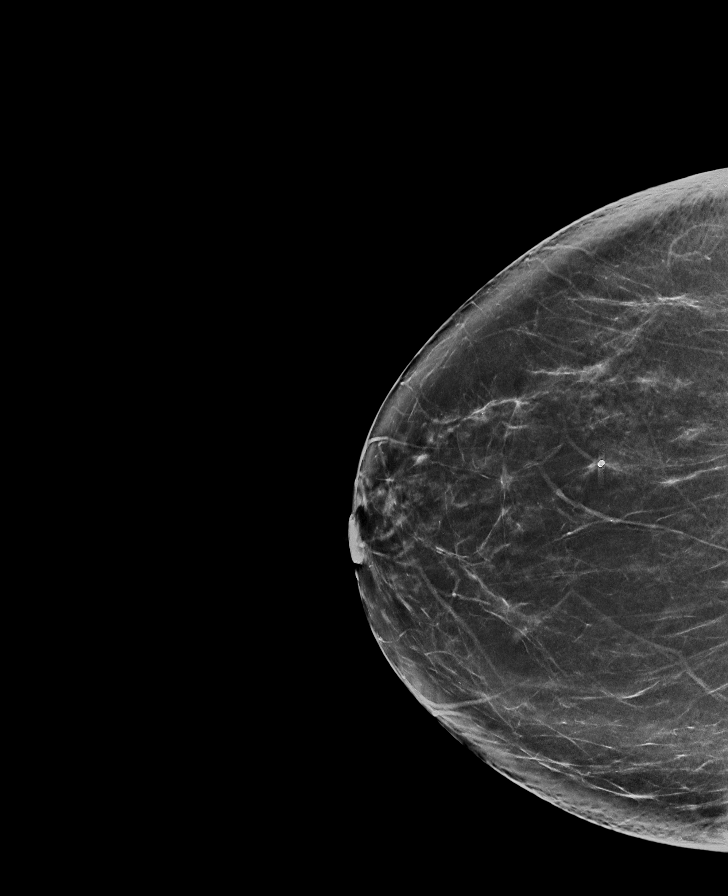

[8 of 28 positions shown; findings below may reference images not displayed]

ACR Breast Density Category b: There are scattered areas of
fibroglandular density.
FINDINGS: There are no findings suspicious for malignancy. Images were
processed with CAD.
IMPRESSION: No mammographic evidence of malignancy. A result letter of this
screening mammogram will be mailed directly to the patient.

RECOMMENDATION:
Screening mammogram in one year. (Code:97-6-RS4)

BI-RADS CATEGORY  1: Negative.

## 2017-03-09 ENCOUNTER — Ambulatory Visit
Admission: RE | Admit: 2017-03-09 | Discharge: 2017-03-09 | Disposition: A | Discharge status: Home / Self Care | Payer: Medicare HMO | Source: Ambulatory Visit | Attending: Orthopedic Surgery | Admitting: Orthopedic Surgery

## 2017-03-09 DIAGNOSIS — M5412 Radiculopathy, cervical region: Secondary | ICD-10-CM

## 2017-03-09 DIAGNOSIS — M50122 Cervical disc disorder at C5-C6 level with radiculopathy: Secondary | ICD-10-CM | POA: Diagnosis not present

## 2017-03-09 MED ORDER — IOPAMIDOL (ISOVUE-M 300) INJECTION 61%
1.0000 mL | Freq: Once | INTRAMUSCULAR | Status: AC | PRN
  Administered 2017-03-09: 1 mL via EPIDURAL

## 2017-03-09 MED ORDER — TRIAMCINOLONE ACETONIDE 40 MG/ML IJ SUSP (RADIOLOGY)
60.0000 mg | Freq: Once | INTRAMUSCULAR | Status: AC
  Administered 2017-03-09: 60 mg via EPIDURAL

## 2017-03-09 NOTE — Discharge Instructions (Signed)

## 2017-03-20 DIAGNOSIS — H2512 Age-related nuclear cataract, left eye: Secondary | ICD-10-CM | POA: Diagnosis not present

## 2017-03-20 DIAGNOSIS — H2513 Age-related nuclear cataract, bilateral: Secondary | ICD-10-CM | POA: Diagnosis not present

## 2017-03-20 DIAGNOSIS — H2511 Age-related nuclear cataract, right eye: Secondary | ICD-10-CM | POA: Diagnosis not present

## 2017-03-26 DIAGNOSIS — H2513 Age-related nuclear cataract, bilateral: Secondary | ICD-10-CM | POA: Diagnosis not present

## 2017-04-03 DIAGNOSIS — H2512 Age-related nuclear cataract, left eye: Secondary | ICD-10-CM | POA: Diagnosis not present

## 2017-04-13 DIAGNOSIS — H2512 Age-related nuclear cataract, left eye: Secondary | ICD-10-CM | POA: Diagnosis not present

## 2017-05-06 DIAGNOSIS — I10 Essential (primary) hypertension: Secondary | ICD-10-CM | POA: Diagnosis not present

## 2017-05-06 DIAGNOSIS — Z0189 Encounter for other specified special examinations: Secondary | ICD-10-CM | POA: Diagnosis not present

## 2017-05-06 DIAGNOSIS — Z79899 Other long term (current) drug therapy: Secondary | ICD-10-CM | POA: Diagnosis not present

## 2017-05-06 DIAGNOSIS — E119 Type 2 diabetes mellitus without complications: Secondary | ICD-10-CM | POA: Diagnosis not present

## 2017-05-06 DIAGNOSIS — E538 Deficiency of other specified B group vitamins: Secondary | ICD-10-CM | POA: Diagnosis not present

## 2017-05-06 DIAGNOSIS — J441 Chronic obstructive pulmonary disease with (acute) exacerbation: Secondary | ICD-10-CM | POA: Diagnosis not present

## 2017-05-06 DIAGNOSIS — J431 Panlobular emphysema: Secondary | ICD-10-CM | POA: Diagnosis not present

## 2017-05-06 DIAGNOSIS — R739 Hyperglycemia, unspecified: Secondary | ICD-10-CM | POA: Diagnosis not present

## 2017-05-06 DIAGNOSIS — E7800 Pure hypercholesterolemia, unspecified: Secondary | ICD-10-CM | POA: Diagnosis not present

## 2017-05-15 DIAGNOSIS — J340 Abscess, furuncle and carbuncle of nose: Secondary | ICD-10-CM | POA: Diagnosis not present

## 2017-05-18 DIAGNOSIS — I10 Essential (primary) hypertension: Secondary | ICD-10-CM | POA: Diagnosis not present

## 2017-05-18 DIAGNOSIS — E119 Type 2 diabetes mellitus without complications: Secondary | ICD-10-CM | POA: Diagnosis not present

## 2017-05-18 DIAGNOSIS — R911 Solitary pulmonary nodule: Secondary | ICD-10-CM | POA: Diagnosis not present

## 2017-05-18 DIAGNOSIS — E7800 Pure hypercholesterolemia, unspecified: Secondary | ICD-10-CM | POA: Diagnosis not present

## 2017-05-18 DIAGNOSIS — G252 Other specified forms of tremor: Secondary | ICD-10-CM | POA: Diagnosis not present

## 2017-05-18 DIAGNOSIS — K7689 Other specified diseases of liver: Secondary | ICD-10-CM | POA: Diagnosis not present

## 2017-05-18 DIAGNOSIS — C569 Malignant neoplasm of unspecified ovary: Secondary | ICD-10-CM | POA: Diagnosis not present

## 2017-05-18 DIAGNOSIS — J449 Chronic obstructive pulmonary disease, unspecified: Secondary | ICD-10-CM | POA: Diagnosis not present

## 2017-05-18 DIAGNOSIS — Z6835 Body mass index (BMI) 35.0-35.9, adult: Secondary | ICD-10-CM | POA: Diagnosis not present

## 2017-05-18 DIAGNOSIS — N823 Fistula of vagina to large intestine: Secondary | ICD-10-CM | POA: Diagnosis not present

## 2017-05-25 DIAGNOSIS — C561 Malignant neoplasm of right ovary: Secondary | ICD-10-CM | POA: Diagnosis not present

## 2017-05-25 DIAGNOSIS — C562 Malignant neoplasm of left ovary: Secondary | ICD-10-CM | POA: Diagnosis not present

## 2017-05-25 DIAGNOSIS — R911 Solitary pulmonary nodule: Secondary | ICD-10-CM | POA: Diagnosis not present

## 2017-05-25 DIAGNOSIS — K439 Ventral hernia without obstruction or gangrene: Secondary | ICD-10-CM | POA: Diagnosis not present

## 2017-05-27 DIAGNOSIS — E119 Type 2 diabetes mellitus without complications: Secondary | ICD-10-CM | POA: Diagnosis not present

## 2017-06-02 DIAGNOSIS — J019 Acute sinusitis, unspecified: Secondary | ICD-10-CM | POA: Diagnosis not present

## 2017-06-02 DIAGNOSIS — R05 Cough: Secondary | ICD-10-CM | POA: Diagnosis not present

## 2017-06-15 DIAGNOSIS — E119 Type 2 diabetes mellitus without complications: Secondary | ICD-10-CM | POA: Diagnosis not present

## 2017-07-20 DIAGNOSIS — D225 Melanocytic nevi of trunk: Secondary | ICD-10-CM | POA: Diagnosis not present

## 2017-07-20 DIAGNOSIS — Z1283 Encounter for screening for malignant neoplasm of skin: Secondary | ICD-10-CM | POA: Diagnosis not present

## 2017-07-20 DIAGNOSIS — D485 Neoplasm of uncertain behavior of skin: Secondary | ICD-10-CM | POA: Diagnosis not present

## 2017-07-20 DIAGNOSIS — L718 Other rosacea: Secondary | ICD-10-CM | POA: Diagnosis not present

## 2017-07-20 DIAGNOSIS — Z85828 Personal history of other malignant neoplasm of skin: Secondary | ICD-10-CM | POA: Diagnosis not present

## 2017-07-20 DIAGNOSIS — L821 Other seborrheic keratosis: Secondary | ICD-10-CM | POA: Diagnosis not present

## 2017-07-20 DIAGNOSIS — L578 Other skin changes due to chronic exposure to nonionizing radiation: Secondary | ICD-10-CM | POA: Diagnosis not present

## 2017-07-20 DIAGNOSIS — D1800 Hemangioma unspecified site: Secondary | ICD-10-CM | POA: Diagnosis not present

## 2017-07-20 DIAGNOSIS — C44712 Basal cell carcinoma of skin of right lower limb, including hip: Secondary | ICD-10-CM | POA: Diagnosis not present

## 2017-07-20 DIAGNOSIS — L700 Acne vulgaris: Secondary | ICD-10-CM | POA: Diagnosis not present

## 2017-07-20 DIAGNOSIS — L820 Inflamed seborrheic keratosis: Secondary | ICD-10-CM | POA: Diagnosis not present

## 2017-08-10 DIAGNOSIS — Z808 Family history of malignant neoplasm of other organs or systems: Secondary | ICD-10-CM | POA: Diagnosis not present

## 2017-08-10 DIAGNOSIS — Z800 Family history of malignant neoplasm of digestive organs: Secondary | ICD-10-CM | POA: Diagnosis not present

## 2017-08-10 DIAGNOSIS — E7800 Pure hypercholesterolemia, unspecified: Secondary | ICD-10-CM | POA: Diagnosis not present

## 2017-08-10 DIAGNOSIS — Z803 Family history of malignant neoplasm of breast: Secondary | ICD-10-CM | POA: Diagnosis not present

## 2017-08-10 DIAGNOSIS — K439 Ventral hernia without obstruction or gangrene: Secondary | ICD-10-CM | POA: Diagnosis not present

## 2017-08-10 DIAGNOSIS — C569 Malignant neoplasm of unspecified ovary: Secondary | ICD-10-CM | POA: Diagnosis not present

## 2017-08-10 DIAGNOSIS — Z9049 Acquired absence of other specified parts of digestive tract: Secondary | ICD-10-CM | POA: Diagnosis not present

## 2017-08-10 DIAGNOSIS — Z6835 Body mass index (BMI) 35.0-35.9, adult: Secondary | ICD-10-CM | POA: Diagnosis not present

## 2017-08-10 DIAGNOSIS — Z90710 Acquired absence of both cervix and uterus: Secondary | ICD-10-CM | POA: Diagnosis not present

## 2017-08-10 DIAGNOSIS — I10 Essential (primary) hypertension: Secondary | ICD-10-CM | POA: Diagnosis not present

## 2017-08-13 DIAGNOSIS — M25551 Pain in right hip: Secondary | ICD-10-CM | POA: Diagnosis not present

## 2017-08-13 DIAGNOSIS — M7061 Trochanteric bursitis, right hip: Secondary | ICD-10-CM | POA: Diagnosis not present

## 2017-09-15 DIAGNOSIS — L578 Other skin changes due to chronic exposure to nonionizing radiation: Secondary | ICD-10-CM | POA: Diagnosis not present

## 2017-09-15 DIAGNOSIS — C44712 Basal cell carcinoma of skin of right lower limb, including hip: Secondary | ICD-10-CM | POA: Diagnosis not present

## 2017-09-17 DIAGNOSIS — M7061 Trochanteric bursitis, right hip: Secondary | ICD-10-CM | POA: Diagnosis not present

## 2017-09-17 DIAGNOSIS — M47816 Spondylosis without myelopathy or radiculopathy, lumbar region: Secondary | ICD-10-CM | POA: Diagnosis not present

## 2017-09-21 DIAGNOSIS — E119 Type 2 diabetes mellitus without complications: Secondary | ICD-10-CM | POA: Diagnosis not present

## 2017-09-21 DIAGNOSIS — Z79899 Other long term (current) drug therapy: Secondary | ICD-10-CM | POA: Diagnosis not present

## 2017-09-21 DIAGNOSIS — E7800 Pure hypercholesterolemia, unspecified: Secondary | ICD-10-CM | POA: Diagnosis not present

## 2017-09-21 DIAGNOSIS — I10 Essential (primary) hypertension: Secondary | ICD-10-CM | POA: Diagnosis not present

## 2017-09-23 DIAGNOSIS — E1169 Type 2 diabetes mellitus with other specified complication: Secondary | ICD-10-CM | POA: Diagnosis not present

## 2017-09-23 DIAGNOSIS — E669 Obesity, unspecified: Secondary | ICD-10-CM | POA: Diagnosis not present

## 2017-09-23 DIAGNOSIS — E1165 Type 2 diabetes mellitus with hyperglycemia: Secondary | ICD-10-CM | POA: Diagnosis not present

## 2017-09-28 DIAGNOSIS — E7800 Pure hypercholesterolemia, unspecified: Secondary | ICD-10-CM | POA: Diagnosis not present

## 2017-09-28 DIAGNOSIS — I10 Essential (primary) hypertension: Secondary | ICD-10-CM | POA: Diagnosis not present

## 2017-09-28 DIAGNOSIS — E1165 Type 2 diabetes mellitus with hyperglycemia: Secondary | ICD-10-CM | POA: Diagnosis not present

## 2017-09-28 DIAGNOSIS — J431 Panlobular emphysema: Secondary | ICD-10-CM | POA: Diagnosis not present

## 2017-09-28 DIAGNOSIS — R0602 Shortness of breath: Secondary | ICD-10-CM | POA: Diagnosis not present

## 2017-10-23 DIAGNOSIS — E1165 Type 2 diabetes mellitus with hyperglycemia: Secondary | ICD-10-CM | POA: Diagnosis not present

## 2017-10-23 DIAGNOSIS — R6889 Other general symptoms and signs: Secondary | ICD-10-CM | POA: Diagnosis not present

## 2017-10-23 DIAGNOSIS — J431 Panlobular emphysema: Secondary | ICD-10-CM | POA: Diagnosis not present

## 2017-10-23 DIAGNOSIS — I10 Essential (primary) hypertension: Secondary | ICD-10-CM | POA: Diagnosis not present

## 2017-11-08 DIAGNOSIS — R52 Pain, unspecified: Secondary | ICD-10-CM | POA: Diagnosis not present

## 2017-11-08 DIAGNOSIS — J441 Chronic obstructive pulmonary disease with (acute) exacerbation: Secondary | ICD-10-CM | POA: Diagnosis not present

## 2017-11-09 DIAGNOSIS — C561 Malignant neoplasm of right ovary: Secondary | ICD-10-CM | POA: Diagnosis not present

## 2017-11-09 DIAGNOSIS — C562 Malignant neoplasm of left ovary: Secondary | ICD-10-CM | POA: Diagnosis not present

## 2017-11-09 DIAGNOSIS — Z79899 Other long term (current) drug therapy: Secondary | ICD-10-CM | POA: Diagnosis not present

## 2017-11-09 DIAGNOSIS — Z9221 Personal history of antineoplastic chemotherapy: Secondary | ICD-10-CM | POA: Diagnosis not present

## 2017-11-09 DIAGNOSIS — Z6835 Body mass index (BMI) 35.0-35.9, adult: Secondary | ICD-10-CM | POA: Diagnosis not present

## 2017-11-09 DIAGNOSIS — C569 Malignant neoplasm of unspecified ovary: Secondary | ICD-10-CM | POA: Diagnosis not present

## 2017-11-09 DIAGNOSIS — K439 Ventral hernia without obstruction or gangrene: Secondary | ICD-10-CM | POA: Diagnosis not present

## 2017-11-13 DIAGNOSIS — J019 Acute sinusitis, unspecified: Secondary | ICD-10-CM | POA: Diagnosis not present

## 2017-12-03 DIAGNOSIS — S8001XA Contusion of right knee, initial encounter: Secondary | ICD-10-CM | POA: Diagnosis not present

## 2017-12-08 DIAGNOSIS — H1033 Unspecified acute conjunctivitis, bilateral: Secondary | ICD-10-CM | POA: Diagnosis not present

## 2017-12-18 DIAGNOSIS — E1169 Type 2 diabetes mellitus with other specified complication: Secondary | ICD-10-CM | POA: Diagnosis not present

## 2017-12-18 DIAGNOSIS — E669 Obesity, unspecified: Secondary | ICD-10-CM | POA: Diagnosis not present

## 2017-12-28 ENCOUNTER — Other Ambulatory Visit: Payer: Self-pay | Admitting: Internal Medicine

## 2017-12-28 DIAGNOSIS — E669 Obesity, unspecified: Secondary | ICD-10-CM | POA: Diagnosis not present

## 2017-12-28 DIAGNOSIS — E1169 Type 2 diabetes mellitus with other specified complication: Secondary | ICD-10-CM | POA: Diagnosis not present

## 2017-12-28 DIAGNOSIS — E1165 Type 2 diabetes mellitus with hyperglycemia: Secondary | ICD-10-CM | POA: Diagnosis not present

## 2017-12-29 ENCOUNTER — Other Ambulatory Visit: Payer: Self-pay | Admitting: Internal Medicine

## 2017-12-29 DIAGNOSIS — Z1231 Encounter for screening mammogram for malignant neoplasm of breast: Secondary | ICD-10-CM

## 2017-12-29 DIAGNOSIS — E669 Obesity, unspecified: Secondary | ICD-10-CM | POA: Diagnosis not present

## 2017-12-29 DIAGNOSIS — E1169 Type 2 diabetes mellitus with other specified complication: Secondary | ICD-10-CM | POA: Diagnosis not present

## 2018-01-04 DIAGNOSIS — Z79899 Other long term (current) drug therapy: Secondary | ICD-10-CM | POA: Diagnosis not present

## 2018-01-04 DIAGNOSIS — Z1239 Encounter for other screening for malignant neoplasm of breast: Secondary | ICD-10-CM | POA: Diagnosis not present

## 2018-01-04 DIAGNOSIS — D649 Anemia, unspecified: Secondary | ICD-10-CM | POA: Insufficient documentation

## 2018-01-04 DIAGNOSIS — J431 Panlobular emphysema: Secondary | ICD-10-CM | POA: Diagnosis not present

## 2018-01-04 DIAGNOSIS — E7800 Pure hypercholesterolemia, unspecified: Secondary | ICD-10-CM | POA: Diagnosis not present

## 2018-01-04 DIAGNOSIS — I10 Essential (primary) hypertension: Secondary | ICD-10-CM | POA: Diagnosis not present

## 2018-01-04 DIAGNOSIS — R0602 Shortness of breath: Secondary | ICD-10-CM | POA: Diagnosis not present

## 2018-01-04 DIAGNOSIS — E1165 Type 2 diabetes mellitus with hyperglycemia: Secondary | ICD-10-CM | POA: Diagnosis not present

## 2018-01-04 DIAGNOSIS — Z0000 Encounter for general adult medical examination without abnormal findings: Secondary | ICD-10-CM | POA: Diagnosis not present

## 2018-01-08 DIAGNOSIS — E119 Type 2 diabetes mellitus without complications: Secondary | ICD-10-CM | POA: Diagnosis not present

## 2018-01-18 DIAGNOSIS — E7800 Pure hypercholesterolemia, unspecified: Secondary | ICD-10-CM | POA: Diagnosis not present

## 2018-01-18 DIAGNOSIS — E119 Type 2 diabetes mellitus without complications: Secondary | ICD-10-CM | POA: Diagnosis not present

## 2018-01-18 DIAGNOSIS — Z08 Encounter for follow-up examination after completed treatment for malignant neoplasm: Secondary | ICD-10-CM | POA: Diagnosis not present

## 2018-01-18 DIAGNOSIS — Z9049 Acquired absence of other specified parts of digestive tract: Secondary | ICD-10-CM | POA: Diagnosis not present

## 2018-01-18 DIAGNOSIS — Z8543 Personal history of malignant neoplasm of ovary: Secondary | ICD-10-CM | POA: Diagnosis not present

## 2018-01-18 DIAGNOSIS — I10 Essential (primary) hypertension: Secondary | ICD-10-CM | POA: Diagnosis not present

## 2018-01-18 DIAGNOSIS — K219 Gastro-esophageal reflux disease without esophagitis: Secondary | ICD-10-CM | POA: Diagnosis not present

## 2018-01-18 DIAGNOSIS — Z9221 Personal history of antineoplastic chemotherapy: Secondary | ICD-10-CM | POA: Diagnosis not present

## 2018-01-18 DIAGNOSIS — Z6835 Body mass index (BMI) 35.0-35.9, adult: Secondary | ICD-10-CM | POA: Diagnosis not present

## 2018-01-18 DIAGNOSIS — C569 Malignant neoplasm of unspecified ovary: Secondary | ICD-10-CM | POA: Diagnosis not present

## 2018-01-18 DIAGNOSIS — Z90710 Acquired absence of both cervix and uterus: Secondary | ICD-10-CM | POA: Diagnosis not present

## 2018-01-19 DIAGNOSIS — M19049 Primary osteoarthritis, unspecified hand: Secondary | ICD-10-CM | POA: Insufficient documentation

## 2018-01-19 DIAGNOSIS — M79604 Pain in right leg: Secondary | ICD-10-CM | POA: Diagnosis not present

## 2018-01-19 DIAGNOSIS — M79605 Pain in left leg: Secondary | ICD-10-CM | POA: Diagnosis not present

## 2018-01-19 DIAGNOSIS — R0602 Shortness of breath: Secondary | ICD-10-CM | POA: Diagnosis not present

## 2018-01-21 ENCOUNTER — Ambulatory Visit
Admission: RE | Admit: 2018-01-21 | Discharge: 2018-01-21 | Disposition: A | Discharge status: Home / Self Care | Payer: Medicare HMO | Source: Ambulatory Visit | Attending: Internal Medicine | Admitting: Internal Medicine

## 2018-01-21 DIAGNOSIS — Z1231 Encounter for screening mammogram for malignant neoplasm of breast: Secondary | ICD-10-CM | POA: Insufficient documentation

## 2018-01-21 HISTORY — DX: Personal history of antineoplastic chemotherapy: Z92.21

## 2018-07-06 DIAGNOSIS — J441 Chronic obstructive pulmonary disease with (acute) exacerbation: Secondary | ICD-10-CM | POA: Diagnosis not present

## 2018-07-06 DIAGNOSIS — I10 Essential (primary) hypertension: Secondary | ICD-10-CM | POA: Diagnosis not present

## 2018-07-06 DIAGNOSIS — E1165 Type 2 diabetes mellitus with hyperglycemia: Secondary | ICD-10-CM | POA: Diagnosis not present

## 2018-07-06 DIAGNOSIS — Z794 Long term (current) use of insulin: Secondary | ICD-10-CM | POA: Diagnosis not present

## 2018-07-06 DIAGNOSIS — E7800 Pure hypercholesterolemia, unspecified: Secondary | ICD-10-CM | POA: Diagnosis not present

## 2018-07-06 DIAGNOSIS — D649 Anemia, unspecified: Secondary | ICD-10-CM | POA: Diagnosis not present

## 2018-07-06 DIAGNOSIS — E119 Type 2 diabetes mellitus without complications: Secondary | ICD-10-CM | POA: Diagnosis not present

## 2018-07-19 DIAGNOSIS — Z9221 Personal history of antineoplastic chemotherapy: Secondary | ICD-10-CM | POA: Diagnosis not present

## 2018-07-19 DIAGNOSIS — I10 Essential (primary) hypertension: Secondary | ICD-10-CM | POA: Diagnosis not present

## 2018-07-19 DIAGNOSIS — Z90710 Acquired absence of both cervix and uterus: Secondary | ICD-10-CM | POA: Diagnosis not present

## 2018-07-19 DIAGNOSIS — C569 Malignant neoplasm of unspecified ovary: Secondary | ICD-10-CM | POA: Diagnosis not present

## 2018-07-19 DIAGNOSIS — E7800 Pure hypercholesterolemia, unspecified: Secondary | ICD-10-CM | POA: Diagnosis not present

## 2018-07-19 DIAGNOSIS — Z9049 Acquired absence of other specified parts of digestive tract: Secondary | ICD-10-CM | POA: Diagnosis not present

## 2018-07-19 DIAGNOSIS — Z6836 Body mass index (BMI) 36.0-36.9, adult: Secondary | ICD-10-CM | POA: Diagnosis not present

## 2018-07-19 DIAGNOSIS — E119 Type 2 diabetes mellitus without complications: Secondary | ICD-10-CM | POA: Diagnosis not present

## 2018-07-27 DIAGNOSIS — D220 Melanocytic nevi of lip: Secondary | ICD-10-CM | POA: Diagnosis not present

## 2018-07-27 DIAGNOSIS — D1800 Hemangioma unspecified site: Secondary | ICD-10-CM | POA: Diagnosis not present

## 2018-07-27 DIAGNOSIS — D225 Melanocytic nevi of trunk: Secondary | ICD-10-CM | POA: Diagnosis not present

## 2018-07-27 DIAGNOSIS — Z1283 Encounter for screening for malignant neoplasm of skin: Secondary | ICD-10-CM | POA: Diagnosis not present

## 2018-07-27 DIAGNOSIS — Z85828 Personal history of other malignant neoplasm of skin: Secondary | ICD-10-CM | POA: Diagnosis not present

## 2018-07-27 DIAGNOSIS — D229 Melanocytic nevi, unspecified: Secondary | ICD-10-CM | POA: Diagnosis not present

## 2018-07-27 DIAGNOSIS — L578 Other skin changes due to chronic exposure to nonionizing radiation: Secondary | ICD-10-CM | POA: Diagnosis not present

## 2018-07-27 DIAGNOSIS — D485 Neoplasm of uncertain behavior of skin: Secondary | ICD-10-CM | POA: Diagnosis not present

## 2018-07-27 DIAGNOSIS — L820 Inflamed seborrheic keratosis: Secondary | ICD-10-CM | POA: Diagnosis not present

## 2018-10-18 DIAGNOSIS — Z800 Family history of malignant neoplasm of digestive organs: Secondary | ICD-10-CM | POA: Diagnosis not present

## 2018-10-18 DIAGNOSIS — R0602 Shortness of breath: Secondary | ICD-10-CM | POA: Diagnosis not present

## 2018-10-18 DIAGNOSIS — C569 Malignant neoplasm of unspecified ovary: Secondary | ICD-10-CM | POA: Diagnosis not present

## 2018-10-18 DIAGNOSIS — Z9049 Acquired absence of other specified parts of digestive tract: Secondary | ICD-10-CM | POA: Diagnosis not present

## 2018-10-18 DIAGNOSIS — E7800 Pure hypercholesterolemia, unspecified: Secondary | ICD-10-CM | POA: Diagnosis not present

## 2018-10-18 DIAGNOSIS — C562 Malignant neoplasm of left ovary: Secondary | ICD-10-CM | POA: Diagnosis not present

## 2018-10-18 DIAGNOSIS — Z90710 Acquired absence of both cervix and uterus: Secondary | ICD-10-CM | POA: Diagnosis not present

## 2018-10-18 DIAGNOSIS — Z6835 Body mass index (BMI) 35.0-35.9, adult: Secondary | ICD-10-CM | POA: Diagnosis not present

## 2018-10-18 DIAGNOSIS — Z803 Family history of malignant neoplasm of breast: Secondary | ICD-10-CM | POA: Diagnosis not present

## 2018-10-18 DIAGNOSIS — Z808 Family history of malignant neoplasm of other organs or systems: Secondary | ICD-10-CM | POA: Diagnosis not present

## 2018-10-18 DIAGNOSIS — E119 Type 2 diabetes mellitus without complications: Secondary | ICD-10-CM | POA: Diagnosis not present

## 2018-11-01 DIAGNOSIS — K429 Umbilical hernia without obstruction or gangrene: Secondary | ICD-10-CM | POA: Diagnosis not present

## 2018-11-01 DIAGNOSIS — R911 Solitary pulmonary nodule: Secondary | ICD-10-CM | POA: Diagnosis not present

## 2018-11-01 DIAGNOSIS — C562 Malignant neoplasm of left ovary: Secondary | ICD-10-CM | POA: Diagnosis not present

## 2018-11-01 DIAGNOSIS — C787 Secondary malignant neoplasm of liver and intrahepatic bile duct: Secondary | ICD-10-CM | POA: Diagnosis not present

## 2018-11-01 DIAGNOSIS — Z980 Intestinal bypass and anastomosis status: Secondary | ICD-10-CM | POA: Diagnosis not present

## 2018-11-01 DIAGNOSIS — N200 Calculus of kidney: Secondary | ICD-10-CM | POA: Diagnosis not present

## 2018-11-01 DIAGNOSIS — K5750 Diverticulosis of both small and large intestine without perforation or abscess without bleeding: Secondary | ICD-10-CM | POA: Diagnosis not present

## 2018-11-01 DIAGNOSIS — R109 Unspecified abdominal pain: Secondary | ICD-10-CM | POA: Diagnosis not present

## 2018-11-01 DIAGNOSIS — M6208 Separation of muscle (nontraumatic), other site: Secondary | ICD-10-CM | POA: Diagnosis not present

## 2018-11-01 DIAGNOSIS — J9811 Atelectasis: Secondary | ICD-10-CM | POA: Diagnosis not present

## 2018-11-02 DIAGNOSIS — E119 Type 2 diabetes mellitus without complications: Secondary | ICD-10-CM | POA: Diagnosis not present

## 2018-11-02 DIAGNOSIS — Z794 Long term (current) use of insulin: Secondary | ICD-10-CM | POA: Diagnosis not present

## 2018-11-08 DIAGNOSIS — Z794 Long term (current) use of insulin: Secondary | ICD-10-CM | POA: Diagnosis not present

## 2018-11-08 DIAGNOSIS — E119 Type 2 diabetes mellitus without complications: Secondary | ICD-10-CM | POA: Diagnosis not present

## 2018-11-08 DIAGNOSIS — I10 Essential (primary) hypertension: Secondary | ICD-10-CM | POA: Diagnosis not present

## 2018-11-24 ENCOUNTER — Other Ambulatory Visit: Payer: Self-pay | Admitting: Internal Medicine

## 2018-11-24 DIAGNOSIS — Z8543 Personal history of malignant neoplasm of ovary: Secondary | ICD-10-CM | POA: Diagnosis not present

## 2018-11-24 DIAGNOSIS — E7800 Pure hypercholesterolemia, unspecified: Secondary | ICD-10-CM | POA: Diagnosis not present

## 2018-11-24 DIAGNOSIS — E119 Type 2 diabetes mellitus without complications: Secondary | ICD-10-CM | POA: Diagnosis not present

## 2018-11-24 DIAGNOSIS — I10 Essential (primary) hypertension: Secondary | ICD-10-CM | POA: Diagnosis not present

## 2018-11-24 DIAGNOSIS — R0602 Shortness of breath: Secondary | ICD-10-CM | POA: Diagnosis not present

## 2018-11-24 DIAGNOSIS — Z87891 Personal history of nicotine dependence: Secondary | ICD-10-CM | POA: Diagnosis not present

## 2018-11-24 DIAGNOSIS — Z794 Long term (current) use of insulin: Secondary | ICD-10-CM | POA: Diagnosis not present

## 2018-11-24 DIAGNOSIS — Z1231 Encounter for screening mammogram for malignant neoplasm of breast: Secondary | ICD-10-CM

## 2018-11-24 DIAGNOSIS — Z0000 Encounter for general adult medical examination without abnormal findings: Secondary | ICD-10-CM | POA: Diagnosis not present

## 2018-11-25 DIAGNOSIS — R1084 Generalized abdominal pain: Secondary | ICD-10-CM | POA: Diagnosis not present

## 2018-11-25 DIAGNOSIS — R194 Change in bowel habit: Secondary | ICD-10-CM | POA: Diagnosis not present

## 2018-11-25 DIAGNOSIS — R197 Diarrhea, unspecified: Secondary | ICD-10-CM | POA: Diagnosis not present

## 2018-11-25 DIAGNOSIS — R1013 Epigastric pain: Secondary | ICD-10-CM | POA: Diagnosis not present

## 2018-11-25 DIAGNOSIS — M8588 Other specified disorders of bone density and structure, other site: Secondary | ICD-10-CM | POA: Diagnosis not present

## 2018-11-26 ENCOUNTER — Other Ambulatory Visit
Admission: RE | Admit: 2018-11-26 | Discharge: 2018-11-26 | Disposition: A | Discharge status: Home / Self Care | Payer: Medicare HMO | Source: Ambulatory Visit | Attending: Gastroenterology | Admitting: Gastroenterology

## 2018-11-26 DIAGNOSIS — R1013 Epigastric pain: Secondary | ICD-10-CM | POA: Insufficient documentation

## 2018-11-26 DIAGNOSIS — R1084 Generalized abdominal pain: Secondary | ICD-10-CM | POA: Diagnosis not present

## 2018-11-26 LAB — C DIFFICILE QUICK SCREEN W PCR REFLEX??
C Diff antigen: NEGATIVE
C Diff toxin: NEGATIVE

## 2018-11-26 LAB — C DIFFICILE QUICK SCREEN W PCR REFLEX: C Diff interpretation: NOT DETECTED

## 2018-11-30 LAB — STOOL CULTURE REFLEX - RSASHR

## 2018-11-30 LAB — STOOL CULTURE: E coli, Shiga toxin Assay: NEGATIVE

## 2018-11-30 LAB — O&P RESULT

## 2018-11-30 LAB — OVA + PARASITE EXAM

## 2018-11-30 LAB — STOOL CULTURE REFLEX - CMPCXR

## 2018-12-03 DIAGNOSIS — I6521 Occlusion and stenosis of right carotid artery: Secondary | ICD-10-CM | POA: Diagnosis not present

## 2018-12-03 DIAGNOSIS — R200 Anesthesia of skin: Secondary | ICD-10-CM | POA: Diagnosis not present

## 2018-12-29 DIAGNOSIS — Z0100 Encounter for examination of eyes and vision without abnormal findings: Secondary | ICD-10-CM | POA: Diagnosis not present

## 2018-12-29 DIAGNOSIS — H524 Presbyopia: Secondary | ICD-10-CM | POA: Diagnosis not present

## 2019-01-17 DIAGNOSIS — C561 Malignant neoplasm of right ovary: Secondary | ICD-10-CM | POA: Diagnosis not present

## 2019-01-17 DIAGNOSIS — E119 Type 2 diabetes mellitus without complications: Secondary | ICD-10-CM | POA: Diagnosis not present

## 2019-01-17 DIAGNOSIS — E7800 Pure hypercholesterolemia, unspecified: Secondary | ICD-10-CM | POA: Diagnosis not present

## 2019-01-17 DIAGNOSIS — Z9049 Acquired absence of other specified parts of digestive tract: Secondary | ICD-10-CM | POA: Diagnosis not present

## 2019-01-17 DIAGNOSIS — I10 Essential (primary) hypertension: Secondary | ICD-10-CM | POA: Diagnosis not present

## 2019-01-17 DIAGNOSIS — Z90710 Acquired absence of both cervix and uterus: Secondary | ICD-10-CM | POA: Diagnosis not present

## 2019-01-17 DIAGNOSIS — K219 Gastro-esophageal reflux disease without esophagitis: Secondary | ICD-10-CM | POA: Diagnosis not present

## 2019-01-17 DIAGNOSIS — C569 Malignant neoplasm of unspecified ovary: Secondary | ICD-10-CM | POA: Diagnosis not present

## 2019-01-17 DIAGNOSIS — Z6836 Body mass index (BMI) 36.0-36.9, adult: Secondary | ICD-10-CM | POA: Diagnosis not present

## 2019-01-17 DIAGNOSIS — Z9221 Personal history of antineoplastic chemotherapy: Secondary | ICD-10-CM | POA: Diagnosis not present

## 2019-01-24 DIAGNOSIS — Z20828 Contact with and (suspected) exposure to other viral communicable diseases: Secondary | ICD-10-CM | POA: Diagnosis not present

## 2019-01-24 DIAGNOSIS — Z01812 Encounter for preprocedural laboratory examination: Secondary | ICD-10-CM | POA: Diagnosis not present

## 2019-01-25 DIAGNOSIS — C561 Malignant neoplasm of right ovary: Secondary | ICD-10-CM | POA: Diagnosis not present

## 2019-01-25 DIAGNOSIS — R932 Abnormal findings on diagnostic imaging of liver and biliary tract: Secondary | ICD-10-CM | POA: Diagnosis not present

## 2019-01-25 DIAGNOSIS — R109 Unspecified abdominal pain: Secondary | ICD-10-CM | POA: Diagnosis not present

## 2019-01-27 DIAGNOSIS — I10 Essential (primary) hypertension: Secondary | ICD-10-CM | POA: Diagnosis not present

## 2019-01-27 DIAGNOSIS — K529 Noninfective gastroenteritis and colitis, unspecified: Secondary | ICD-10-CM | POA: Diagnosis not present

## 2019-01-27 DIAGNOSIS — K5730 Diverticulosis of large intestine without perforation or abscess without bleeding: Secondary | ICD-10-CM | POA: Diagnosis not present

## 2019-01-27 DIAGNOSIS — K648 Other hemorrhoids: Secondary | ICD-10-CM | POA: Diagnosis not present

## 2019-01-27 DIAGNOSIS — K449 Diaphragmatic hernia without obstruction or gangrene: Secondary | ICD-10-CM | POA: Diagnosis not present

## 2019-01-27 DIAGNOSIS — Z980 Intestinal bypass and anastomosis status: Secondary | ICD-10-CM | POA: Diagnosis not present

## 2019-01-27 DIAGNOSIS — E7800 Pure hypercholesterolemia, unspecified: Secondary | ICD-10-CM | POA: Diagnosis not present

## 2019-01-27 DIAGNOSIS — E119 Type 2 diabetes mellitus without complications: Secondary | ICD-10-CM | POA: Diagnosis not present

## 2019-01-27 DIAGNOSIS — R197 Diarrhea, unspecified: Secondary | ICD-10-CM | POA: Diagnosis not present

## 2019-01-27 DIAGNOSIS — K317 Polyp of stomach and duodenum: Secondary | ICD-10-CM | POA: Diagnosis not present

## 2019-01-27 DIAGNOSIS — K2270 Barrett's esophagus without dysplasia: Secondary | ICD-10-CM | POA: Diagnosis not present

## 2019-01-27 DIAGNOSIS — C569 Malignant neoplasm of unspecified ovary: Secondary | ICD-10-CM | POA: Diagnosis not present

## 2019-01-31 ENCOUNTER — Ambulatory Visit
Admission: RE | Admit: 2019-01-31 | Discharge: 2019-01-31 | Disposition: A | Discharge status: Home / Self Care | Payer: Medicare HMO | Source: Ambulatory Visit | Attending: Internal Medicine | Admitting: Internal Medicine

## 2019-01-31 DIAGNOSIS — Z1231 Encounter for screening mammogram for malignant neoplasm of breast: Secondary | ICD-10-CM | POA: Diagnosis not present

## 2019-02-09 DIAGNOSIS — C569 Malignant neoplasm of unspecified ovary: Secondary | ICD-10-CM | POA: Diagnosis not present

## 2019-02-09 DIAGNOSIS — K769 Liver disease, unspecified: Secondary | ICD-10-CM | POA: Diagnosis not present

## 2019-02-17 DIAGNOSIS — R8290 Unspecified abnormal findings in urine: Secondary | ICD-10-CM | POA: Diagnosis not present

## 2019-02-17 DIAGNOSIS — E1165 Type 2 diabetes mellitus with hyperglycemia: Secondary | ICD-10-CM | POA: Diagnosis not present

## 2019-02-17 DIAGNOSIS — I10 Essential (primary) hypertension: Secondary | ICD-10-CM | POA: Diagnosis not present

## 2019-02-17 DIAGNOSIS — E7800 Pure hypercholesterolemia, unspecified: Secondary | ICD-10-CM | POA: Diagnosis not present

## 2019-02-17 DIAGNOSIS — Z79899 Other long term (current) drug therapy: Secondary | ICD-10-CM | POA: Diagnosis not present

## 2019-02-24 DIAGNOSIS — I10 Essential (primary) hypertension: Secondary | ICD-10-CM | POA: Diagnosis not present

## 2019-02-24 DIAGNOSIS — C569 Malignant neoplasm of unspecified ovary: Secondary | ICD-10-CM | POA: Diagnosis not present

## 2019-02-24 DIAGNOSIS — J431 Panlobular emphysema: Secondary | ICD-10-CM | POA: Diagnosis not present

## 2019-02-24 DIAGNOSIS — E7800 Pure hypercholesterolemia, unspecified: Secondary | ICD-10-CM | POA: Diagnosis not present

## 2019-02-24 DIAGNOSIS — C787 Secondary malignant neoplasm of liver and intrahepatic bile duct: Secondary | ICD-10-CM | POA: Diagnosis not present

## 2019-02-24 DIAGNOSIS — Z5111 Encounter for antineoplastic chemotherapy: Secondary | ICD-10-CM | POA: Diagnosis not present

## 2019-02-24 DIAGNOSIS — E1165 Type 2 diabetes mellitus with hyperglycemia: Secondary | ICD-10-CM | POA: Diagnosis not present

## 2019-02-24 DIAGNOSIS — Z87891 Personal history of nicotine dependence: Secondary | ICD-10-CM | POA: Diagnosis not present

## 2019-02-24 DIAGNOSIS — E119 Type 2 diabetes mellitus without complications: Secondary | ICD-10-CM | POA: Diagnosis not present

## 2019-02-28 DIAGNOSIS — Z5111 Encounter for antineoplastic chemotherapy: Secondary | ICD-10-CM | POA: Diagnosis not present

## 2019-02-28 DIAGNOSIS — C569 Malignant neoplasm of unspecified ovary: Secondary | ICD-10-CM | POA: Diagnosis not present

## 2019-03-03 DIAGNOSIS — C569 Malignant neoplasm of unspecified ovary: Secondary | ICD-10-CM | POA: Diagnosis not present

## 2019-03-03 DIAGNOSIS — E119 Type 2 diabetes mellitus without complications: Secondary | ICD-10-CM | POA: Diagnosis not present

## 2019-03-03 DIAGNOSIS — Z5111 Encounter for antineoplastic chemotherapy: Secondary | ICD-10-CM | POA: Diagnosis not present

## 2019-03-08 DIAGNOSIS — K580 Irritable bowel syndrome with diarrhea: Secondary | ICD-10-CM | POA: Diagnosis not present

## 2019-03-08 DIAGNOSIS — K2270 Barrett's esophagus without dysplasia: Secondary | ICD-10-CM | POA: Diagnosis not present

## 2019-03-10 DIAGNOSIS — C569 Malignant neoplasm of unspecified ovary: Secondary | ICD-10-CM | POA: Diagnosis not present

## 2019-03-10 DIAGNOSIS — Z5111 Encounter for antineoplastic chemotherapy: Secondary | ICD-10-CM | POA: Diagnosis not present

## 2019-03-10 DIAGNOSIS — E119 Type 2 diabetes mellitus without complications: Secondary | ICD-10-CM | POA: Diagnosis not present

## 2019-03-16 ENCOUNTER — Ambulatory Visit: Admit: 2019-03-16 | Payer: Medicare HMO | Admitting: Internal Medicine

## 2019-03-16 SURGERY — ESOPHAGOGASTRODUODENOSCOPY (EGD) WITH PROPOFOL
Anesthesia: General

## 2019-03-17 DIAGNOSIS — E119 Type 2 diabetes mellitus without complications: Secondary | ICD-10-CM | POA: Diagnosis not present

## 2019-03-17 DIAGNOSIS — C569 Malignant neoplasm of unspecified ovary: Secondary | ICD-10-CM | POA: Diagnosis not present

## 2019-03-17 DIAGNOSIS — Z5111 Encounter for antineoplastic chemotherapy: Secondary | ICD-10-CM | POA: Diagnosis not present

## 2019-03-21 DIAGNOSIS — C569 Malignant neoplasm of unspecified ovary: Secondary | ICD-10-CM | POA: Diagnosis not present

## 2019-03-21 DIAGNOSIS — Z5111 Encounter for antineoplastic chemotherapy: Secondary | ICD-10-CM | POA: Diagnosis not present

## 2019-03-24 DIAGNOSIS — C569 Malignant neoplasm of unspecified ovary: Secondary | ICD-10-CM | POA: Diagnosis not present

## 2019-03-24 DIAGNOSIS — Z5111 Encounter for antineoplastic chemotherapy: Secondary | ICD-10-CM | POA: Diagnosis not present

## 2019-03-24 DIAGNOSIS — E119 Type 2 diabetes mellitus without complications: Secondary | ICD-10-CM | POA: Diagnosis not present

## 2019-03-28 DIAGNOSIS — Z6835 Body mass index (BMI) 35.0-35.9, adult: Secondary | ICD-10-CM | POA: Diagnosis not present

## 2019-03-28 DIAGNOSIS — C569 Malignant neoplasm of unspecified ovary: Secondary | ICD-10-CM | POA: Diagnosis not present

## 2019-03-31 DIAGNOSIS — E119 Type 2 diabetes mellitus without complications: Secondary | ICD-10-CM | POA: Diagnosis not present

## 2019-03-31 DIAGNOSIS — C569 Malignant neoplasm of unspecified ovary: Secondary | ICD-10-CM | POA: Diagnosis not present

## 2019-03-31 DIAGNOSIS — Z5111 Encounter for antineoplastic chemotherapy: Secondary | ICD-10-CM | POA: Diagnosis not present

## 2019-04-01 DIAGNOSIS — Z79899 Other long term (current) drug therapy: Secondary | ICD-10-CM | POA: Diagnosis not present

## 2019-04-01 DIAGNOSIS — C569 Malignant neoplasm of unspecified ovary: Secondary | ICD-10-CM | POA: Diagnosis not present

## 2019-04-07 DIAGNOSIS — C569 Malignant neoplasm of unspecified ovary: Secondary | ICD-10-CM | POA: Diagnosis not present

## 2019-04-07 DIAGNOSIS — E119 Type 2 diabetes mellitus without complications: Secondary | ICD-10-CM | POA: Diagnosis not present

## 2019-04-07 DIAGNOSIS — Z5111 Encounter for antineoplastic chemotherapy: Secondary | ICD-10-CM | POA: Diagnosis not present

## 2019-04-11 DIAGNOSIS — C569 Malignant neoplasm of unspecified ovary: Secondary | ICD-10-CM | POA: Diagnosis not present

## 2019-04-11 DIAGNOSIS — Z5111 Encounter for antineoplastic chemotherapy: Secondary | ICD-10-CM | POA: Diagnosis not present

## 2019-04-14 DIAGNOSIS — C569 Malignant neoplasm of unspecified ovary: Secondary | ICD-10-CM | POA: Diagnosis not present

## 2019-04-14 DIAGNOSIS — Z5111 Encounter for antineoplastic chemotherapy: Secondary | ICD-10-CM | POA: Diagnosis not present

## 2019-04-14 DIAGNOSIS — E119 Type 2 diabetes mellitus without complications: Secondary | ICD-10-CM | POA: Diagnosis not present

## 2019-04-20 DIAGNOSIS — E119 Type 2 diabetes mellitus without complications: Secondary | ICD-10-CM | POA: Diagnosis not present

## 2019-04-20 DIAGNOSIS — C569 Malignant neoplasm of unspecified ovary: Secondary | ICD-10-CM | POA: Diagnosis not present

## 2019-04-20 DIAGNOSIS — Z5111 Encounter for antineoplastic chemotherapy: Secondary | ICD-10-CM | POA: Diagnosis not present

## 2019-04-28 DIAGNOSIS — C569 Malignant neoplasm of unspecified ovary: Secondary | ICD-10-CM | POA: Diagnosis not present

## 2019-04-28 DIAGNOSIS — E119 Type 2 diabetes mellitus without complications: Secondary | ICD-10-CM | POA: Diagnosis not present

## 2019-04-28 DIAGNOSIS — Z5111 Encounter for antineoplastic chemotherapy: Secondary | ICD-10-CM | POA: Diagnosis not present

## 2019-04-29 DIAGNOSIS — Z4589 Encounter for adjustment and management of other implanted devices: Secondary | ICD-10-CM | POA: Diagnosis not present

## 2019-04-29 DIAGNOSIS — C562 Malignant neoplasm of left ovary: Secondary | ICD-10-CM | POA: Diagnosis not present

## 2019-05-10 DIAGNOSIS — K219 Gastro-esophageal reflux disease without esophagitis: Secondary | ICD-10-CM | POA: Diagnosis not present

## 2019-05-10 DIAGNOSIS — C569 Malignant neoplasm of unspecified ovary: Secondary | ICD-10-CM | POA: Diagnosis not present

## 2019-05-10 DIAGNOSIS — C787 Secondary malignant neoplasm of liver and intrahepatic bile duct: Secondary | ICD-10-CM | POA: Diagnosis not present

## 2019-05-10 DIAGNOSIS — Z6836 Body mass index (BMI) 36.0-36.9, adult: Secondary | ICD-10-CM | POA: Diagnosis not present

## 2019-05-10 DIAGNOSIS — Z7984 Long term (current) use of oral hypoglycemic drugs: Secondary | ICD-10-CM | POA: Diagnosis not present

## 2019-05-10 DIAGNOSIS — C562 Malignant neoplasm of left ovary: Secondary | ICD-10-CM | POA: Diagnosis not present

## 2019-05-10 DIAGNOSIS — C561 Malignant neoplasm of right ovary: Secondary | ICD-10-CM | POA: Diagnosis not present

## 2019-05-10 DIAGNOSIS — E119 Type 2 diabetes mellitus without complications: Secondary | ICD-10-CM | POA: Diagnosis not present

## 2019-05-10 DIAGNOSIS — Z803 Family history of malignant neoplasm of breast: Secondary | ICD-10-CM | POA: Diagnosis not present

## 2019-05-10 DIAGNOSIS — Z87891 Personal history of nicotine dependence: Secondary | ICD-10-CM | POA: Diagnosis not present

## 2019-05-10 DIAGNOSIS — Z7982 Long term (current) use of aspirin: Secondary | ICD-10-CM | POA: Diagnosis not present

## 2019-05-11 DIAGNOSIS — Z794 Long term (current) use of insulin: Secondary | ICD-10-CM | POA: Diagnosis not present

## 2019-05-11 DIAGNOSIS — E119 Type 2 diabetes mellitus without complications: Secondary | ICD-10-CM | POA: Diagnosis not present

## 2019-05-11 DIAGNOSIS — R399 Unspecified symptoms and signs involving the genitourinary system: Secondary | ICD-10-CM | POA: Diagnosis not present

## 2019-05-16 DIAGNOSIS — I10 Essential (primary) hypertension: Secondary | ICD-10-CM | POA: Diagnosis not present

## 2019-05-16 DIAGNOSIS — C561 Malignant neoplasm of right ovary: Secondary | ICD-10-CM | POA: Diagnosis not present

## 2019-05-16 DIAGNOSIS — Z90710 Acquired absence of both cervix and uterus: Secondary | ICD-10-CM | POA: Diagnosis not present

## 2019-05-16 DIAGNOSIS — C787 Secondary malignant neoplasm of liver and intrahepatic bile duct: Secondary | ICD-10-CM | POA: Diagnosis not present

## 2019-05-16 DIAGNOSIS — Z6836 Body mass index (BMI) 36.0-36.9, adult: Secondary | ICD-10-CM | POA: Diagnosis not present

## 2019-05-16 DIAGNOSIS — Z5111 Encounter for antineoplastic chemotherapy: Secondary | ICD-10-CM | POA: Diagnosis not present

## 2019-05-16 DIAGNOSIS — C562 Malignant neoplasm of left ovary: Secondary | ICD-10-CM | POA: Diagnosis not present

## 2019-05-16 DIAGNOSIS — E119 Type 2 diabetes mellitus without complications: Secondary | ICD-10-CM | POA: Diagnosis not present

## 2019-05-16 DIAGNOSIS — E7800 Pure hypercholesterolemia, unspecified: Secondary | ICD-10-CM | POA: Diagnosis not present

## 2019-05-16 DIAGNOSIS — K760 Fatty (change of) liver, not elsewhere classified: Secondary | ICD-10-CM | POA: Diagnosis not present

## 2019-05-18 DIAGNOSIS — C561 Malignant neoplasm of right ovary: Secondary | ICD-10-CM | POA: Diagnosis not present

## 2019-05-18 DIAGNOSIS — E119 Type 2 diabetes mellitus without complications: Secondary | ICD-10-CM | POA: Diagnosis not present

## 2019-05-18 DIAGNOSIS — I10 Essential (primary) hypertension: Secondary | ICD-10-CM | POA: Diagnosis not present

## 2019-05-18 DIAGNOSIS — C562 Malignant neoplasm of left ovary: Secondary | ICD-10-CM | POA: Diagnosis not present

## 2019-05-18 DIAGNOSIS — Z794 Long term (current) use of insulin: Secondary | ICD-10-CM | POA: Diagnosis not present

## 2019-05-20 DIAGNOSIS — C561 Malignant neoplasm of right ovary: Secondary | ICD-10-CM | POA: Diagnosis not present

## 2019-05-20 DIAGNOSIS — C562 Malignant neoplasm of left ovary: Secondary | ICD-10-CM | POA: Diagnosis not present

## 2019-05-20 DIAGNOSIS — K760 Fatty (change of) liver, not elsewhere classified: Secondary | ICD-10-CM | POA: Diagnosis not present

## 2019-05-20 DIAGNOSIS — K439 Ventral hernia without obstruction or gangrene: Secondary | ICD-10-CM | POA: Diagnosis not present

## 2019-05-25 DIAGNOSIS — I10 Essential (primary) hypertension: Secondary | ICD-10-CM | POA: Diagnosis not present

## 2019-05-25 DIAGNOSIS — Z87891 Personal history of nicotine dependence: Secondary | ICD-10-CM | POA: Diagnosis not present

## 2019-05-25 DIAGNOSIS — Z0000 Encounter for general adult medical examination without abnormal findings: Secondary | ICD-10-CM | POA: Diagnosis not present

## 2019-05-25 DIAGNOSIS — Z794 Long term (current) use of insulin: Secondary | ICD-10-CM | POA: Diagnosis not present

## 2019-05-25 DIAGNOSIS — Z79899 Other long term (current) drug therapy: Secondary | ICD-10-CM | POA: Diagnosis not present

## 2019-05-25 DIAGNOSIS — J449 Chronic obstructive pulmonary disease, unspecified: Secondary | ICD-10-CM | POA: Diagnosis not present

## 2019-05-25 DIAGNOSIS — E785 Hyperlipidemia, unspecified: Secondary | ICD-10-CM | POA: Diagnosis not present

## 2019-05-25 DIAGNOSIS — E119 Type 2 diabetes mellitus without complications: Secondary | ICD-10-CM | POA: Diagnosis not present

## 2019-06-08 DIAGNOSIS — K769 Liver disease, unspecified: Secondary | ICD-10-CM | POA: Diagnosis not present

## 2019-06-08 DIAGNOSIS — C787 Secondary malignant neoplasm of liver and intrahepatic bile duct: Secondary | ICD-10-CM | POA: Diagnosis not present

## 2019-06-08 DIAGNOSIS — C569 Malignant neoplasm of unspecified ovary: Secondary | ICD-10-CM | POA: Diagnosis not present

## 2019-06-15 DIAGNOSIS — C561 Malignant neoplasm of right ovary: Secondary | ICD-10-CM | POA: Diagnosis not present

## 2019-06-15 DIAGNOSIS — C562 Malignant neoplasm of left ovary: Secondary | ICD-10-CM | POA: Diagnosis not present

## 2019-06-15 DIAGNOSIS — E119 Type 2 diabetes mellitus without complications: Secondary | ICD-10-CM | POA: Diagnosis not present

## 2019-06-15 DIAGNOSIS — K219 Gastro-esophageal reflux disease without esophagitis: Secondary | ICD-10-CM | POA: Diagnosis not present

## 2019-06-15 DIAGNOSIS — Z7984 Long term (current) use of oral hypoglycemic drugs: Secondary | ICD-10-CM | POA: Diagnosis not present

## 2019-06-15 DIAGNOSIS — Z87891 Personal history of nicotine dependence: Secondary | ICD-10-CM | POA: Diagnosis not present

## 2019-06-15 DIAGNOSIS — C569 Malignant neoplasm of unspecified ovary: Secondary | ICD-10-CM | POA: Diagnosis not present

## 2019-06-15 DIAGNOSIS — C787 Secondary malignant neoplasm of liver and intrahepatic bile duct: Secondary | ICD-10-CM | POA: Diagnosis not present

## 2019-06-15 DIAGNOSIS — Z803 Family history of malignant neoplasm of breast: Secondary | ICD-10-CM | POA: Diagnosis not present

## 2019-06-15 DIAGNOSIS — Z7982 Long term (current) use of aspirin: Secondary | ICD-10-CM | POA: Diagnosis not present

## 2019-06-28 DIAGNOSIS — C562 Malignant neoplasm of left ovary: Secondary | ICD-10-CM | POA: Diagnosis not present

## 2019-06-28 DIAGNOSIS — K219 Gastro-esophageal reflux disease without esophagitis: Secondary | ICD-10-CM | POA: Diagnosis not present

## 2019-06-28 DIAGNOSIS — Z7982 Long term (current) use of aspirin: Secondary | ICD-10-CM | POA: Diagnosis not present

## 2019-06-28 DIAGNOSIS — Z803 Family history of malignant neoplasm of breast: Secondary | ICD-10-CM | POA: Diagnosis not present

## 2019-06-28 DIAGNOSIS — E119 Type 2 diabetes mellitus without complications: Secondary | ICD-10-CM | POA: Diagnosis not present

## 2019-06-28 DIAGNOSIS — C569 Malignant neoplasm of unspecified ovary: Secondary | ICD-10-CM | POA: Diagnosis not present

## 2019-06-28 DIAGNOSIS — Z7984 Long term (current) use of oral hypoglycemic drugs: Secondary | ICD-10-CM | POA: Diagnosis not present

## 2019-06-28 DIAGNOSIS — Z87891 Personal history of nicotine dependence: Secondary | ICD-10-CM | POA: Diagnosis not present

## 2019-06-28 DIAGNOSIS — C787 Secondary malignant neoplasm of liver and intrahepatic bile duct: Secondary | ICD-10-CM | POA: Diagnosis not present

## 2019-06-28 DIAGNOSIS — C561 Malignant neoplasm of right ovary: Secondary | ICD-10-CM | POA: Diagnosis not present

## 2019-06-29 DIAGNOSIS — Z7984 Long term (current) use of oral hypoglycemic drugs: Secondary | ICD-10-CM | POA: Diagnosis not present

## 2019-06-29 DIAGNOSIS — C561 Malignant neoplasm of right ovary: Secondary | ICD-10-CM | POA: Diagnosis not present

## 2019-06-29 DIAGNOSIS — C562 Malignant neoplasm of left ovary: Secondary | ICD-10-CM | POA: Diagnosis not present

## 2019-06-29 DIAGNOSIS — K219 Gastro-esophageal reflux disease without esophagitis: Secondary | ICD-10-CM | POA: Diagnosis not present

## 2019-06-29 DIAGNOSIS — Z87891 Personal history of nicotine dependence: Secondary | ICD-10-CM | POA: Diagnosis not present

## 2019-06-29 DIAGNOSIS — Z7982 Long term (current) use of aspirin: Secondary | ICD-10-CM | POA: Diagnosis not present

## 2019-06-29 DIAGNOSIS — E119 Type 2 diabetes mellitus without complications: Secondary | ICD-10-CM | POA: Diagnosis not present

## 2019-06-29 DIAGNOSIS — Z803 Family history of malignant neoplasm of breast: Secondary | ICD-10-CM | POA: Diagnosis not present

## 2019-06-29 DIAGNOSIS — C787 Secondary malignant neoplasm of liver and intrahepatic bile duct: Secondary | ICD-10-CM | POA: Diagnosis not present

## 2019-06-30 DIAGNOSIS — C561 Malignant neoplasm of right ovary: Secondary | ICD-10-CM | POA: Diagnosis not present

## 2019-06-30 DIAGNOSIS — C787 Secondary malignant neoplasm of liver and intrahepatic bile duct: Secondary | ICD-10-CM | POA: Diagnosis not present

## 2019-06-30 DIAGNOSIS — Z7984 Long term (current) use of oral hypoglycemic drugs: Secondary | ICD-10-CM | POA: Diagnosis not present

## 2019-06-30 DIAGNOSIS — C562 Malignant neoplasm of left ovary: Secondary | ICD-10-CM | POA: Diagnosis not present

## 2019-06-30 DIAGNOSIS — Z7982 Long term (current) use of aspirin: Secondary | ICD-10-CM | POA: Diagnosis not present

## 2019-06-30 DIAGNOSIS — K219 Gastro-esophageal reflux disease without esophagitis: Secondary | ICD-10-CM | POA: Diagnosis not present

## 2019-06-30 DIAGNOSIS — E119 Type 2 diabetes mellitus without complications: Secondary | ICD-10-CM | POA: Diagnosis not present

## 2019-06-30 DIAGNOSIS — Z87891 Personal history of nicotine dependence: Secondary | ICD-10-CM | POA: Diagnosis not present

## 2019-06-30 DIAGNOSIS — Z803 Family history of malignant neoplasm of breast: Secondary | ICD-10-CM | POA: Diagnosis not present

## 2019-07-01 DIAGNOSIS — C561 Malignant neoplasm of right ovary: Secondary | ICD-10-CM | POA: Diagnosis not present

## 2019-07-01 DIAGNOSIS — Z7982 Long term (current) use of aspirin: Secondary | ICD-10-CM | POA: Diagnosis not present

## 2019-07-01 DIAGNOSIS — C787 Secondary malignant neoplasm of liver and intrahepatic bile duct: Secondary | ICD-10-CM | POA: Diagnosis not present

## 2019-07-01 DIAGNOSIS — Z803 Family history of malignant neoplasm of breast: Secondary | ICD-10-CM | POA: Diagnosis not present

## 2019-07-01 DIAGNOSIS — Z7984 Long term (current) use of oral hypoglycemic drugs: Secondary | ICD-10-CM | POA: Diagnosis not present

## 2019-07-01 DIAGNOSIS — K219 Gastro-esophageal reflux disease without esophagitis: Secondary | ICD-10-CM | POA: Diagnosis not present

## 2019-07-01 DIAGNOSIS — E119 Type 2 diabetes mellitus without complications: Secondary | ICD-10-CM | POA: Diagnosis not present

## 2019-07-01 DIAGNOSIS — Z87891 Personal history of nicotine dependence: Secondary | ICD-10-CM | POA: Diagnosis not present

## 2019-07-01 DIAGNOSIS — C562 Malignant neoplasm of left ovary: Secondary | ICD-10-CM | POA: Diagnosis not present

## 2019-07-05 DIAGNOSIS — Z7982 Long term (current) use of aspirin: Secondary | ICD-10-CM | POA: Diagnosis not present

## 2019-07-05 DIAGNOSIS — C787 Secondary malignant neoplasm of liver and intrahepatic bile duct: Secondary | ICD-10-CM | POA: Diagnosis not present

## 2019-07-05 DIAGNOSIS — K219 Gastro-esophageal reflux disease without esophagitis: Secondary | ICD-10-CM | POA: Diagnosis not present

## 2019-07-05 DIAGNOSIS — C569 Malignant neoplasm of unspecified ovary: Secondary | ICD-10-CM | POA: Diagnosis not present

## 2019-07-05 DIAGNOSIS — E119 Type 2 diabetes mellitus without complications: Secondary | ICD-10-CM | POA: Diagnosis not present

## 2019-07-05 DIAGNOSIS — Z803 Family history of malignant neoplasm of breast: Secondary | ICD-10-CM | POA: Diagnosis not present

## 2019-07-05 DIAGNOSIS — C561 Malignant neoplasm of right ovary: Secondary | ICD-10-CM | POA: Diagnosis not present

## 2019-07-05 DIAGNOSIS — C562 Malignant neoplasm of left ovary: Secondary | ICD-10-CM | POA: Diagnosis not present

## 2019-07-05 DIAGNOSIS — Z87891 Personal history of nicotine dependence: Secondary | ICD-10-CM | POA: Diagnosis not present

## 2019-07-05 DIAGNOSIS — Z7984 Long term (current) use of oral hypoglycemic drugs: Secondary | ICD-10-CM | POA: Diagnosis not present

## 2019-08-24 DIAGNOSIS — C569 Malignant neoplasm of unspecified ovary: Secondary | ICD-10-CM | POA: Diagnosis not present

## 2019-08-24 DIAGNOSIS — C562 Malignant neoplasm of left ovary: Secondary | ICD-10-CM | POA: Diagnosis not present

## 2019-08-24 DIAGNOSIS — E119 Type 2 diabetes mellitus without complications: Secondary | ICD-10-CM | POA: Diagnosis not present

## 2019-08-24 DIAGNOSIS — Z7984 Long term (current) use of oral hypoglycemic drugs: Secondary | ICD-10-CM | POA: Diagnosis not present

## 2019-08-24 DIAGNOSIS — C541 Malignant neoplasm of endometrium: Secondary | ICD-10-CM | POA: Diagnosis not present

## 2019-08-24 DIAGNOSIS — Z87891 Personal history of nicotine dependence: Secondary | ICD-10-CM | POA: Diagnosis not present

## 2019-08-24 DIAGNOSIS — C548 Malignant neoplasm of overlapping sites of corpus uteri: Secondary | ICD-10-CM | POA: Diagnosis not present

## 2019-08-24 DIAGNOSIS — C561 Malignant neoplasm of right ovary: Secondary | ICD-10-CM | POA: Diagnosis not present

## 2019-08-24 DIAGNOSIS — Z803 Family history of malignant neoplasm of breast: Secondary | ICD-10-CM | POA: Diagnosis not present

## 2019-08-24 DIAGNOSIS — Z7982 Long term (current) use of aspirin: Secondary | ICD-10-CM | POA: Diagnosis not present

## 2019-08-24 DIAGNOSIS — C787 Secondary malignant neoplasm of liver and intrahepatic bile duct: Secondary | ICD-10-CM | POA: Diagnosis not present

## 2019-08-24 DIAGNOSIS — Z6837 Body mass index (BMI) 37.0-37.9, adult: Secondary | ICD-10-CM | POA: Diagnosis not present

## 2019-08-24 DIAGNOSIS — K219 Gastro-esophageal reflux disease without esophagitis: Secondary | ICD-10-CM | POA: Diagnosis not present

## 2019-09-05 DIAGNOSIS — M549 Dorsalgia, unspecified: Secondary | ICD-10-CM | POA: Diagnosis not present

## 2019-09-05 DIAGNOSIS — K219 Gastro-esophageal reflux disease without esophagitis: Secondary | ICD-10-CM | POA: Diagnosis not present

## 2019-09-05 DIAGNOSIS — C569 Malignant neoplasm of unspecified ovary: Secondary | ICD-10-CM | POA: Diagnosis not present

## 2019-09-05 DIAGNOSIS — E119 Type 2 diabetes mellitus without complications: Secondary | ICD-10-CM | POA: Diagnosis not present

## 2019-09-05 DIAGNOSIS — E7800 Pure hypercholesterolemia, unspecified: Secondary | ICD-10-CM | POA: Diagnosis not present

## 2019-09-05 DIAGNOSIS — Z6837 Body mass index (BMI) 37.0-37.9, adult: Secondary | ICD-10-CM | POA: Diagnosis not present

## 2019-09-05 DIAGNOSIS — I10 Essential (primary) hypertension: Secondary | ICD-10-CM | POA: Diagnosis not present

## 2019-09-13 DIAGNOSIS — E7800 Pure hypercholesterolemia, unspecified: Secondary | ICD-10-CM | POA: Diagnosis not present

## 2019-09-13 DIAGNOSIS — E119 Type 2 diabetes mellitus without complications: Secondary | ICD-10-CM | POA: Diagnosis not present

## 2019-09-13 DIAGNOSIS — Z79899 Other long term (current) drug therapy: Secondary | ICD-10-CM | POA: Diagnosis not present

## 2019-09-13 DIAGNOSIS — I10 Essential (primary) hypertension: Secondary | ICD-10-CM | POA: Diagnosis not present

## 2019-09-13 DIAGNOSIS — Z794 Long term (current) use of insulin: Secondary | ICD-10-CM | POA: Diagnosis not present

## 2019-09-20 DIAGNOSIS — E669 Obesity, unspecified: Secondary | ICD-10-CM | POA: Diagnosis not present

## 2019-09-20 DIAGNOSIS — E7800 Pure hypercholesterolemia, unspecified: Secondary | ICD-10-CM | POA: Diagnosis not present

## 2019-09-20 DIAGNOSIS — C569 Malignant neoplasm of unspecified ovary: Secondary | ICD-10-CM | POA: Diagnosis not present

## 2019-09-20 DIAGNOSIS — E119 Type 2 diabetes mellitus without complications: Secondary | ICD-10-CM | POA: Diagnosis not present

## 2019-09-20 DIAGNOSIS — J431 Panlobular emphysema: Secondary | ICD-10-CM | POA: Diagnosis not present

## 2019-09-20 DIAGNOSIS — Z794 Long term (current) use of insulin: Secondary | ICD-10-CM | POA: Diagnosis not present

## 2019-09-20 DIAGNOSIS — I10 Essential (primary) hypertension: Secondary | ICD-10-CM | POA: Diagnosis not present

## 2019-09-20 DIAGNOSIS — E1165 Type 2 diabetes mellitus with hyperglycemia: Secondary | ICD-10-CM | POA: Diagnosis not present

## 2019-09-20 DIAGNOSIS — E1169 Type 2 diabetes mellitus with other specified complication: Secondary | ICD-10-CM | POA: Diagnosis not present

## 2019-11-09 DIAGNOSIS — Z794 Long term (current) use of insulin: Secondary | ICD-10-CM | POA: Diagnosis not present

## 2019-11-09 DIAGNOSIS — E7800 Pure hypercholesterolemia, unspecified: Secondary | ICD-10-CM | POA: Diagnosis not present

## 2019-11-09 DIAGNOSIS — I10 Essential (primary) hypertension: Secondary | ICD-10-CM | POA: Diagnosis not present

## 2019-11-09 DIAGNOSIS — E1165 Type 2 diabetes mellitus with hyperglycemia: Secondary | ICD-10-CM | POA: Diagnosis not present

## 2019-11-23 DIAGNOSIS — C569 Malignant neoplasm of unspecified ovary: Secondary | ICD-10-CM | POA: Diagnosis not present

## 2019-11-23 DIAGNOSIS — C541 Malignant neoplasm of endometrium: Secondary | ICD-10-CM | POA: Diagnosis not present

## 2019-11-23 DIAGNOSIS — Z7984 Long term (current) use of oral hypoglycemic drugs: Secondary | ICD-10-CM | POA: Diagnosis not present

## 2019-11-23 DIAGNOSIS — K219 Gastro-esophageal reflux disease without esophagitis: Secondary | ICD-10-CM | POA: Diagnosis not present

## 2019-11-23 DIAGNOSIS — Z5111 Encounter for antineoplastic chemotherapy: Secondary | ICD-10-CM | POA: Diagnosis not present

## 2019-11-23 DIAGNOSIS — C562 Malignant neoplasm of left ovary: Secondary | ICD-10-CM | POA: Diagnosis not present

## 2019-11-23 DIAGNOSIS — C549 Malignant neoplasm of corpus uteri, unspecified: Secondary | ICD-10-CM | POA: Diagnosis not present

## 2019-11-23 DIAGNOSIS — E119 Type 2 diabetes mellitus without complications: Secondary | ICD-10-CM | POA: Diagnosis not present

## 2019-11-23 DIAGNOSIS — Z803 Family history of malignant neoplasm of breast: Secondary | ICD-10-CM | POA: Diagnosis not present

## 2019-11-23 DIAGNOSIS — C787 Secondary malignant neoplasm of liver and intrahepatic bile duct: Secondary | ICD-10-CM | POA: Diagnosis not present

## 2019-11-23 DIAGNOSIS — Z87891 Personal history of nicotine dependence: Secondary | ICD-10-CM | POA: Diagnosis not present

## 2019-11-23 DIAGNOSIS — Z7982 Long term (current) use of aspirin: Secondary | ICD-10-CM | POA: Diagnosis not present

## 2019-11-23 DIAGNOSIS — C561 Malignant neoplasm of right ovary: Secondary | ICD-10-CM | POA: Diagnosis not present

## 2019-12-22 ENCOUNTER — Other Ambulatory Visit: Payer: Self-pay | Admitting: Internal Medicine

## 2019-12-22 DIAGNOSIS — Z1231 Encounter for screening mammogram for malignant neoplasm of breast: Secondary | ICD-10-CM | POA: Diagnosis not present

## 2019-12-22 DIAGNOSIS — I10 Essential (primary) hypertension: Secondary | ICD-10-CM | POA: Diagnosis not present

## 2019-12-22 DIAGNOSIS — Z794 Long term (current) use of insulin: Secondary | ICD-10-CM | POA: Diagnosis not present

## 2019-12-22 DIAGNOSIS — E785 Hyperlipidemia, unspecified: Secondary | ICD-10-CM | POA: Diagnosis not present

## 2019-12-22 DIAGNOSIS — J449 Chronic obstructive pulmonary disease, unspecified: Secondary | ICD-10-CM | POA: Diagnosis not present

## 2019-12-22 DIAGNOSIS — E1165 Type 2 diabetes mellitus with hyperglycemia: Secondary | ICD-10-CM | POA: Diagnosis not present

## 2020-01-09 DIAGNOSIS — I10 Essential (primary) hypertension: Secondary | ICD-10-CM | POA: Diagnosis not present

## 2020-01-09 DIAGNOSIS — Z9221 Personal history of antineoplastic chemotherapy: Secondary | ICD-10-CM | POA: Diagnosis not present

## 2020-01-09 DIAGNOSIS — K219 Gastro-esophageal reflux disease without esophagitis: Secondary | ICD-10-CM | POA: Diagnosis not present

## 2020-01-09 DIAGNOSIS — Z9049 Acquired absence of other specified parts of digestive tract: Secondary | ICD-10-CM | POA: Diagnosis not present

## 2020-01-09 DIAGNOSIS — Z6838 Body mass index (BMI) 38.0-38.9, adult: Secondary | ICD-10-CM | POA: Diagnosis not present

## 2020-01-09 DIAGNOSIS — C787 Secondary malignant neoplasm of liver and intrahepatic bile duct: Secondary | ICD-10-CM | POA: Diagnosis not present

## 2020-01-09 DIAGNOSIS — E7800 Pure hypercholesterolemia, unspecified: Secondary | ICD-10-CM | POA: Diagnosis not present

## 2020-01-09 DIAGNOSIS — E119 Type 2 diabetes mellitus without complications: Secondary | ICD-10-CM | POA: Diagnosis not present

## 2020-01-09 DIAGNOSIS — C569 Malignant neoplasm of unspecified ovary: Secondary | ICD-10-CM | POA: Diagnosis not present

## 2020-01-09 DIAGNOSIS — J449 Chronic obstructive pulmonary disease, unspecified: Secondary | ICD-10-CM | POA: Diagnosis not present

## 2020-01-18 ENCOUNTER — Emergency Department: Payer: Medicare HMO

## 2020-01-18 ENCOUNTER — Emergency Department
Admission: EM | Admit: 2020-01-18 | Discharge: 2020-01-18 | Disposition: A | Discharge status: Home / Self Care | Payer: Medicare HMO | Source: Home / Self Care | Attending: Emergency Medicine | Admitting: Emergency Medicine

## 2020-01-18 ENCOUNTER — Other Ambulatory Visit: Payer: Self-pay

## 2020-01-18 ENCOUNTER — Encounter: Payer: Self-pay | Admitting: Emergency Medicine

## 2020-01-18 DIAGNOSIS — L508 Other urticaria: Secondary | ICD-10-CM | POA: Diagnosis present

## 2020-01-18 DIAGNOSIS — Z8543 Personal history of malignant neoplasm of ovary: Secondary | ICD-10-CM | POA: Insufficient documentation

## 2020-01-18 DIAGNOSIS — Z7984 Long term (current) use of oral hypoglycemic drugs: Secondary | ICD-10-CM | POA: Diagnosis not present

## 2020-01-18 DIAGNOSIS — F419 Anxiety disorder, unspecified: Secondary | ICD-10-CM | POA: Diagnosis present

## 2020-01-18 DIAGNOSIS — Z87891 Personal history of nicotine dependence: Secondary | ICD-10-CM | POA: Insufficient documentation

## 2020-01-18 DIAGNOSIS — R1084 Generalized abdominal pain: Secondary | ICD-10-CM | POA: Insufficient documentation

## 2020-01-18 DIAGNOSIS — K449 Diaphragmatic hernia without obstruction or gangrene: Secondary | ICD-10-CM | POA: Diagnosis not present

## 2020-01-18 DIAGNOSIS — Z66 Do not resuscitate: Secondary | ICD-10-CM | POA: Diagnosis not present

## 2020-01-18 DIAGNOSIS — I10 Essential (primary) hypertension: Secondary | ICD-10-CM | POA: Insufficient documentation

## 2020-01-18 DIAGNOSIS — R1111 Vomiting without nausea: Secondary | ICD-10-CM | POA: Diagnosis not present

## 2020-01-18 DIAGNOSIS — R739 Hyperglycemia, unspecified: Secondary | ICD-10-CM | POA: Diagnosis not present

## 2020-01-18 DIAGNOSIS — Z20822 Contact with and (suspected) exposure to covid-19: Secondary | ICD-10-CM | POA: Insufficient documentation

## 2020-01-18 DIAGNOSIS — Z90710 Acquired absence of both cervix and uterus: Secondary | ICD-10-CM | POA: Diagnosis not present

## 2020-01-18 DIAGNOSIS — J441 Chronic obstructive pulmonary disease with (acute) exacerbation: Secondary | ICD-10-CM | POA: Diagnosis not present

## 2020-01-18 DIAGNOSIS — J431 Panlobular emphysema: Secondary | ICD-10-CM | POA: Diagnosis not present

## 2020-01-18 DIAGNOSIS — R112 Nausea with vomiting, unspecified: Secondary | ICD-10-CM

## 2020-01-18 DIAGNOSIS — E785 Hyperlipidemia, unspecified: Secondary | ICD-10-CM | POA: Diagnosis present

## 2020-01-18 DIAGNOSIS — E1165 Type 2 diabetes mellitus with hyperglycemia: Secondary | ICD-10-CM | POA: Diagnosis not present

## 2020-01-18 DIAGNOSIS — R7989 Other specified abnormal findings of blood chemistry: Secondary | ICD-10-CM | POA: Diagnosis not present

## 2020-01-18 DIAGNOSIS — Z23 Encounter for immunization: Secondary | ICD-10-CM | POA: Diagnosis not present

## 2020-01-18 DIAGNOSIS — K219 Gastro-esophageal reflux disease without esophagitis: Secondary | ICD-10-CM | POA: Insufficient documentation

## 2020-01-18 DIAGNOSIS — J9690 Respiratory failure, unspecified, unspecified whether with hypoxia or hypercapnia: Secondary | ICD-10-CM | POA: Diagnosis not present

## 2020-01-18 DIAGNOSIS — J9601 Acute respiratory failure with hypoxia: Secondary | ICD-10-CM | POA: Diagnosis not present

## 2020-01-18 DIAGNOSIS — R059 Cough, unspecified: Secondary | ICD-10-CM | POA: Diagnosis not present

## 2020-01-18 DIAGNOSIS — F32A Depression, unspecified: Secondary | ICD-10-CM | POA: Diagnosis present

## 2020-01-18 DIAGNOSIS — Z7982 Long term (current) use of aspirin: Secondary | ICD-10-CM | POA: Insufficient documentation

## 2020-01-18 DIAGNOSIS — Z91041 Radiographic dye allergy status: Secondary | ICD-10-CM | POA: Diagnosis not present

## 2020-01-18 DIAGNOSIS — Z9221 Personal history of antineoplastic chemotherapy: Secondary | ICD-10-CM | POA: Diagnosis not present

## 2020-01-18 DIAGNOSIS — E7800 Pure hypercholesterolemia, unspecified: Secondary | ICD-10-CM | POA: Diagnosis present

## 2020-01-18 DIAGNOSIS — Z6839 Body mass index (BMI) 39.0-39.9, adult: Secondary | ICD-10-CM | POA: Diagnosis not present

## 2020-01-18 DIAGNOSIS — E119 Type 2 diabetes mellitus without complications: Secondary | ICD-10-CM | POA: Diagnosis not present

## 2020-01-18 DIAGNOSIS — N200 Calculus of kidney: Secondary | ICD-10-CM | POA: Diagnosis not present

## 2020-01-18 DIAGNOSIS — J811 Chronic pulmonary edema: Secondary | ICD-10-CM | POA: Diagnosis not present

## 2020-01-18 DIAGNOSIS — J449 Chronic obstructive pulmonary disease, unspecified: Secondary | ICD-10-CM | POA: Insufficient documentation

## 2020-01-18 DIAGNOSIS — Z9049 Acquired absence of other specified parts of digestive tract: Secondary | ICD-10-CM | POA: Diagnosis not present

## 2020-01-18 DIAGNOSIS — Z79899 Other long term (current) drug therapy: Secondary | ICD-10-CM | POA: Insufficient documentation

## 2020-01-18 DIAGNOSIS — R0602 Shortness of breath: Secondary | ICD-10-CM | POA: Diagnosis not present

## 2020-01-18 DIAGNOSIS — K5730 Diverticulosis of large intestine without perforation or abscess without bleeding: Secondary | ICD-10-CM | POA: Diagnosis not present

## 2020-01-18 LAB — CBC
HCT: 39.4 % (ref 36.0–46.0)
Hemoglobin: 13.5 g/dL (ref 12.0–15.0)
MCH: 32.6 pg (ref 26.0–34.0)
MCHC: 34.3 g/dL (ref 30.0–36.0)
MCV: 95.2 fL (ref 80.0–100.0)
Platelets: 383 10*3/uL (ref 150–400)
RBC: 4.14 MIL/uL (ref 3.87–5.11)
RDW: 12.3 % (ref 11.5–15.5)
WBC: 16.9 10*3/uL — ABNORMAL HIGH (ref 4.0–10.5)
nRBC: 0 % (ref 0.0–0.2)

## 2020-01-18 LAB — CBC WITH DIFFERENTIAL/PLATELET
Abs Immature Granulocytes: 0.05 10*3/uL (ref 0.00–0.07)
Basophils Absolute: 0.1 10*3/uL (ref 0.0–0.1)
Basophils Relative: 1 %
Eosinophils Absolute: 0.2 10*3/uL (ref 0.0–0.5)
Eosinophils Relative: 1 %
HCT: 40 % (ref 36.0–46.0)
Hemoglobin: 13.6 g/dL (ref 12.0–15.0)
Immature Granulocytes: 0 %
Lymphocytes Relative: 13 %
Lymphs Abs: 2.2 10*3/uL (ref 0.7–4.0)
MCH: 32.9 pg (ref 26.0–34.0)
MCHC: 34 g/dL (ref 30.0–36.0)
MCV: 96.9 fL (ref 80.0–100.0)
Monocytes Absolute: 1.4 10*3/uL — ABNORMAL HIGH (ref 0.1–1.0)
Monocytes Relative: 8 %
Neutro Abs: 13 10*3/uL — ABNORMAL HIGH (ref 1.7–7.7)
Neutrophils Relative %: 77 %
Platelets: 409 10*3/uL — ABNORMAL HIGH (ref 150–400)
RBC: 4.13 MIL/uL (ref 3.87–5.11)
RDW: 12.5 % (ref 11.5–15.5)
WBC: 16.9 10*3/uL — ABNORMAL HIGH (ref 4.0–10.5)
nRBC: 0 % (ref 0.0–0.2)

## 2020-01-18 LAB — COMPREHENSIVE METABOLIC PANEL
ALT: 23 U/L (ref 0–44)
AST: 34 U/L (ref 15–41)
Albumin: 4.1 g/dL (ref 3.5–5.0)
Alkaline Phosphatase: 99 U/L (ref 38–126)
Anion gap: 15 (ref 5–15)
BUN: 25 mg/dL — ABNORMAL HIGH (ref 8–23)
CO2: 25 mmol/L (ref 22–32)
Calcium: 9.2 mg/dL (ref 8.9–10.3)
Chloride: 98 mmol/L (ref 98–111)
Creatinine, Ser: 0.89 mg/dL (ref 0.44–1.00)
GFR, Estimated: >60 mL/min (ref >60)
Glucose, Bld: 208 mg/dL — ABNORMAL HIGH (ref 70–99)
Potassium: 4.3 mmol/L (ref 3.5–5.1)
Sodium: 138 mmol/L (ref 135–145)
Total Bilirubin: 0.8 mg/dL (ref 0.3–1.2)
Total Protein: 7.8 g/dL (ref 6.5–8.1)

## 2020-01-18 LAB — URINALYSIS, COMPLETE (UACMP) WITH MICROSCOPIC
Glucose, UA: NEGATIVE mg/dL
Hgb urine dipstick: NEGATIVE
Ketones, ur: 5 mg/dL — AB
Nitrite: NEGATIVE
Protein, ur: >=300 mg/dL — AB
Specific Gravity, Urine: 1.027 (ref 1.005–1.030)
pH: 5 (ref 5.0–8.0)

## 2020-01-18 LAB — RESP PANEL BY RT-PCR (FLU A&B, COVID) ARPGX2
Influenza A by PCR: NEGATIVE
Influenza B by PCR: NEGATIVE
SARS Coronavirus 2 by RT PCR: NEGATIVE

## 2020-01-18 LAB — LIPASE, BLOOD: Lipase: 25 U/L (ref 11–51)

## 2020-01-18 MED ORDER — LACTATED RINGERS IV BOLUS
1000.0000 mL | Freq: Once | INTRAVENOUS | Status: AC
  Administered 2020-01-18: 15:00:00 1000 mL via INTRAVENOUS

## 2020-01-18 MED ORDER — ONDANSETRON HCL 4 MG/2ML IJ SOLN
4.0000 mg | Freq: Once | INTRAMUSCULAR | Status: AC
  Administered 2020-01-18: 15:00:00 4 mg via INTRAVENOUS
  Filled 2020-01-18: qty 2

## 2020-01-18 MED ORDER — AZITHROMYCIN 250 MG PO TABS: ORAL_TABLET | ORAL | 0 refills | Status: DC

## 2020-01-18 MED ORDER — CEPHALEXIN 500 MG PO CAPS: 500.0000 mg | ORAL_CAPSULE | Freq: Three times a day (TID) | ORAL | 0 refills | Status: AC

## 2020-01-18 MED ORDER — IPRATROPIUM-ALBUTEROL 0.5-2.5 (3) MG/3ML IN SOLN
3.0000 mL | Freq: Once | RESPIRATORY_TRACT | Status: AC
  Administered 2020-01-18: 16:00:00 3 mL via RESPIRATORY_TRACT
  Filled 2020-01-18: qty 3

## 2020-01-18 NOTE — ED Provider Notes (Addendum)
[facility] Emergency Department Provider Note  ____________________________________________  Time seen: Approximately 2:47 PM  I have reviewed the triage vital signs and the nursing notes.   HISTORY  Chief Complaint Abdominal Pain    HPI Kayla Wu is a 74 y.o. female with a history of COPD hypertension ovarian cancer status post chemotherapy and small bowel obstruction who comes ED complaining of generalized abdominal pain described as cramping, waxing and waning, nonradiating, worse with eating and associated with nausea and vomiting.  She has not been able to tolerate any food or fluids for the past 12 hours.  Pain is currently 0/10.  Denies any hernia.  No chest pain or shortness of breath, no fever, no diarrhea or constipation.  Reports she had a normal bowel movement earlier today.      Past Medical History:  Diagnosis Date  . Chronic urticaria   . COPD (chronic obstructive pulmonary disease) ([place])   . DDD (degenerative disc disease)   . GERD (gastroesophageal reflux disease)   . Hypertension   . Menopausal state   . Non-cardiac chest pain   . Ovarian cancer ([place]) 2012   s/p chemo   . Personal history of chemotherapy 2012   ovarian  . Pneumonia 1987     Patient Active Problem List   Diagnosis Date Noted  . Encounter for antineoplastic chemotherapy 03/16/2015  . 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 Blood glucose elevated 10/11/2014  . Breathlessness on exertion 07/19/2014  . Chronic obstructive pulmonary disease ([place]) 04/20/2013  . Narrowing of intervertebral disc space 04/20/2013  . Benign essential HTN 04/20/2013  . HLD (hyperlipidemia) 04/20/2013  . Benign hypertension 04/20/2013  . Panlobular emphysema ([place]) 04/20/2013  . Pure hypercholesterolemia 04/20/2013  . COPD pft's pending 07/08/2012  . Chronic obstructive pulmonary disease with acute exacerbation ([place]) 07/08/2012   . 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 Acid reflux 01/09/2011  . Malignant neoplasm of ovary ([place]) 12/16/2010     Past Surgical History:  Procedure Laterality Date  . APPENDECTOMY    . CHOLECYSTECTOMY    . FINGER ARTHROPLASTY Left 12/22/2014   Procedure: FINGER ARTHROPLASTY;  Surgeon: [name], MD;  Location: [facility] ORS;  Service: Orthopedics;  Laterality: Left;  . hysterectomy       Prior to Admission medications   Medication Sig Start Date End Date Taking? Authorizing Provider  aspirin 81 MG tablet Take 81 mg by mouth daily. In pm    [provider]  atorvastatin (LIPITOR) 40 MG tablet Take 40 mg by mouth daily at 6 PM.    [provider]  calcium carbonate (OS-CAL) 600 MG TABS Take 600 mg by mouth daily.    [provider]  desvenlafaxine (PRISTIQ) 100 MG 24 hr tablet Take 100 mg by mouth daily.    [provider]  ferrous sulfate 325 (65 FE) MG tablet Take 325 mg by mouth daily with breakfast.    [provider]  hydrOXYzine (ATARAX/VISTARIL) 50 MG tablet Take 50 mg by mouth 3 (three) times daily as needed for itching.    [provider]  losartan (COZAAR) 50 MG tablet Take 50 mg by mouth daily. In am    [provider]  NEOMYCIN-POLYMYXIN-HYDROCORTISONE (CORTISPORIN) 1 % SOLN otic solution Apply 1-2 drops to toe BID after soaking 06/25/15   Hyatt, Max T, DPM  omeprazole (PRILOSEC) 40 MG capsule Take 40 mg by mouth daily. In am  [provider]  primidone (MYSOLINE) 50 MG tablet Take by mouth. 03/28/15 06/26/15  [provider]  venlafaxine XR (EFFEXOR-XR) 150 MG 24 hr capsule  01/30/15   [provider]  vitamin B-12 (CYANOCOBALAMIN) 250 MCG tablet Take 250 mcg by mouth daily.    [provider]  zolpidem (AMBIEN) 10 MG tablet Take 10 mg by mouth at bedtime as needed for sleep.    [provider]     Allergies Patient has no known allergies.   Family  History  Problem Relation Age of Onset  . Emphysema Father        smoked  . Breast cancer Mother 77  . Colon cancer Sister     Social History Social History   Tobacco Use  . Smoking status: Former Smoker    Packs/day: 2.00    Years: 42.00    Pack years: 84.00    Types: Cigarettes    Quit date: 07/05/2007    Years since quitting: 12.5  . Smokeless tobacco: Never Used  Substance Use Topics  . Alcohol use: No    Comment: occasional glass of wine once a month  . Drug use: No    Review of Systems  Constitutional:   No fever or chills.  ENT:   No sore throat. No rhinorrhea. Cardiovascular:   No chest pain or syncope. Respiratory:   No dyspnea or cough. Gastrointestinal:   Positive as above for abdominal pain and vomiting Musculoskeletal:   Negative for focal pain or swelling All other systems reviewed and are negative except as documented above in ROS and HPI.  ____________________________________________   PHYSICAL EXAM:  VITAL SIGNS: ED Triage Vitals  Enc Vitals Group     BP 01/18/20 1049 (!) 134/54     Pulse Rate 01/18/20 1049 89     Resp 01/18/20 1049 16     Temp 01/18/20 1049 98 F (36.7 C)     Temp Source 01/18/20 1049 Oral     SpO2 01/18/20 1049 92 %     Weight 01/18/20 1047 199 lb 15.3 oz (90.7 kg)     Height 01/18/20 1047 5\' 3"  (1.6 m)     Head Circumference --      Peak Flow --      Pain Score 01/18/20 1046 0     Pain Loc --      Pain Edu? --      Excl. in [place]? --     Vital signs reviewed, nursing assessments reviewed.   Constitutional:   Alert and oriented. Non-toxic appearance. Eyes:   Conjunctivae are normal. EOMI. PERRL. ENT      Head:   Normocephalic and atraumatic.      Nose:   Wearing a mask.      Mouth/Throat:   Wearing a mask.      Neck:   No meningismus. Full ROM. Hematological/Lymphatic/Immunilogical:   No cervical lymphadenopathy. Cardiovascular:   RRR. Symmetric bilateral radial and DP pulses.  No murmurs. Cap refill less than 2  seconds. Respiratory:   Normal respiratory effort without tachypnea/retractions. Breath sounds are clear and equal bilaterally. No wheezes/rales/rhonchi. Gastrointestinal:   Soft with mild generalized tenderness.  Mildly distended and tympanic.  Not peritoneal.  No rebound, rigidity, or guarding. Musculoskeletal:   Normal range of motion in all extremities. No joint effusions.  No lower extremity tenderness.  No edema. Neurologic:   Normal speech and language.  Motor grossly intact. No acute focal neurologic deficits are appreciated.  Skin:  Skin is warm, dry and intact. No rash noted.  No petechiae, purpura, or bullae.  ____________________________________________    LABS (pertinent positives/negatives) (all labs ordered are listed, but only abnormal results are displayed) Labs Reviewed  COMPREHENSIVE METABOLIC PANEL - Abnormal; Notable for the following components:      Result Value   Glucose, Bld 208 (*)    BUN 25 (*)    All other components within normal limits  CBC - Abnormal; Notable for the following components:   WBC 16.9 (*)    All other components within normal limits  URINALYSIS, COMPLETE (UACMP) WITH MICROSCOPIC - Abnormal; Notable for the following components:   Color, Urine AMBER (*)    APPearance CLOUDY (*)    Bilirubin Urine SMALL (*)    Ketones, ur 5 (*)    Protein, ur >=300 (*)    Leukocytes,Ua SMALL (*)    Bacteria, UA RARE (*)    All other components within normal limits  LIPASE, BLOOD   ____________________________________________   EKG    ____________________________________________    RADIOLOGY  No results found.  ____________________________________________   PROCEDURES Procedures  ____________________________________________  DIFFERENTIAL DIAGNOSIS   Small bowel obstruction, tumor, viral syndrome, internal hernia, diverticulitis, pancreatitis  CLINICAL IMPRESSION / ASSESSMENT AND PLAN / ED COURSE  Medications ordered in the  ED: Medications  ondansetron (ZOFRAN) injection 4 mg (4 mg Intravenous Given 01/18/20 1443)  lactated ringers bolus 1,000 mL (1,000 mLs Intravenous New Bag/Given 01/18/20 1444)    Pertinent labs & imaging results that were available during my care of the patient were reviewed by me and considered in my medical decision making (see chart for details).  Kayla Wu was evaluated in Emergency Department on 01/18/2020 for the symptoms described in the history of present illness. She was evaluated in the context of the global COVID-19 pandemic, which necessitated consideration that the patient might be at risk for infection with the SARS-CoV-2 virus that causes COVID-19. Institutional protocols and algorithms that pertain to the evaluation of patients at risk for COVID-19 are in a state of rapid change based on information released by regulatory bodies including the CDC and federal and state organizations. These policies and algorithms were followed during the patient's care in the ED.   Patient status post cholecystectomy presents with generalized abdominal pain and nausea and vomiting with p.o. intolerance.  Vital signs are normal.  Exam is not peritoneal, but most worrisome for SBO.  Will obtain CT scan without IV contrast due to patient reported allergy, give Zofran IV fluids.  Care of patient will be signed out to oncoming provider at the end of my shift.      ____________________________________________   FINAL CLINICAL IMPRESSION(S) / ED DIAGNOSES    Final diagnoses:  Generalized abdominal pain  Non-intractable vomiting with nausea, unspecified vomiting type     ED Discharge Orders    None      Portions of this note were generated with dragon dictation software. Dictation errors may occur despite best attempts at proofreading.   Carrie Mew, MD 01/18/20 [place]    Carrie Mew, MD 01/18/20 1451

## 2020-01-18 NOTE — ED Notes (Signed)
Pts O2 saturation decreased to 80% on RA while awake. MD notified. Pt placed on 2L via [place]. Pt in NAD at this time. VSS. Awaiting further orders. Will continue to monitor.

## 2020-01-18 NOTE — ED Triage Notes (Signed)
Abdominal cramping and vomiting x 18 hours.

## 2020-01-18 NOTE — ED Provider Notes (Signed)
Patient CT returns negative with only scarring in the bases.  Patient is hypoxic much lower than usual even with her COPD.  She has a occasional slight cough.  She is not having any pleuritic chest pain or tachycardia.  She wants to go home.  She refuses to stay in the hospital.  I will give her some antibiotics she has inhalers at home.  We will give her a little bit of steroids as well.  She promises to return if she gets more short of breath.  She understands she might have a risk of getting worse and getting seriously ill..  Husband will keep a close eye on her.  They have a pulse ox at home.   Kayla Polio, MD 01/18/20 [place]

## 2020-01-18 NOTE — Discharge Instructions (Addendum)
Please return for increasing shortness of breath, fever, worse pain vomiting or feeling sicker.  Please use your inhalers as directed.  Use the Zithromax antibiotic 2 pills today and 1 pill every day after that for 4 days and the Keflex antibiotic 1 pill 3 times a day.  These 2 antibiotic should cover any kind of problem including most pneumonias that might be making your oxygen level low.  I really wish she would stay in the hospital I think it would be safer but I understand that she wanted go home.  Please remember to return if you feel sicker at all or if you change your mind.  Lease follow-up with your doctor within the week to check and see how you are doing.

## 2020-01-19 ENCOUNTER — Inpatient Hospital Stay: Payer: Medicare HMO

## 2020-01-19 ENCOUNTER — Inpatient Hospital Stay
Admission: EM | Admit: 2020-01-19 | Discharge: 2020-01-21 | DRG: 190 | Disposition: A | Discharge status: Home / Self Care | Payer: Medicare HMO | Attending: Internal Medicine | Admitting: Internal Medicine

## 2020-01-19 ENCOUNTER — Other Ambulatory Visit: Payer: Self-pay

## 2020-01-19 ENCOUNTER — Encounter: Payer: Self-pay | Admitting: *Deleted

## 2020-01-19 DIAGNOSIS — E785 Hyperlipidemia, unspecified: Secondary | ICD-10-CM | POA: Diagnosis present

## 2020-01-19 DIAGNOSIS — J431 Panlobular emphysema: Secondary | ICD-10-CM | POA: Diagnosis present

## 2020-01-19 DIAGNOSIS — Z79899 Other long term (current) drug therapy: Secondary | ICD-10-CM

## 2020-01-19 DIAGNOSIS — K219 Gastro-esophageal reflux disease without esophagitis: Secondary | ICD-10-CM | POA: Diagnosis present

## 2020-01-19 DIAGNOSIS — Z23 Encounter for immunization: Secondary | ICD-10-CM

## 2020-01-19 DIAGNOSIS — Z7982 Long term (current) use of aspirin: Secondary | ICD-10-CM

## 2020-01-19 DIAGNOSIS — Z91041 Radiographic dye allergy status: Secondary | ICD-10-CM

## 2020-01-19 DIAGNOSIS — J441 Chronic obstructive pulmonary disease with (acute) exacerbation: Secondary | ICD-10-CM | POA: Diagnosis not present

## 2020-01-19 DIAGNOSIS — Z87891 Personal history of nicotine dependence: Secondary | ICD-10-CM | POA: Diagnosis not present

## 2020-01-19 DIAGNOSIS — Z90710 Acquired absence of both cervix and uterus: Secondary | ICD-10-CM | POA: Diagnosis not present

## 2020-01-19 DIAGNOSIS — F32A Depression, unspecified: Secondary | ICD-10-CM | POA: Diagnosis present

## 2020-01-19 DIAGNOSIS — Z6839 Body mass index (BMI) 39.0-39.9, adult: Secondary | ICD-10-CM

## 2020-01-19 DIAGNOSIS — R739 Hyperglycemia, unspecified: Secondary | ICD-10-CM | POA: Diagnosis present

## 2020-01-19 DIAGNOSIS — R7989 Other specified abnormal findings of blood chemistry: Secondary | ICD-10-CM

## 2020-01-19 DIAGNOSIS — E1165 Type 2 diabetes mellitus with hyperglycemia: Secondary | ICD-10-CM | POA: Diagnosis present

## 2020-01-19 DIAGNOSIS — Z20822 Contact with and (suspected) exposure to covid-19: Secondary | ICD-10-CM | POA: Diagnosis present

## 2020-01-19 DIAGNOSIS — Z8543 Personal history of malignant neoplasm of ovary: Secondary | ICD-10-CM | POA: Diagnosis not present

## 2020-01-19 DIAGNOSIS — Z66 Do not resuscitate: Secondary | ICD-10-CM | POA: Diagnosis present

## 2020-01-19 DIAGNOSIS — Z9049 Acquired absence of other specified parts of digestive tract: Secondary | ICD-10-CM | POA: Diagnosis not present

## 2020-01-19 DIAGNOSIS — I10 Essential (primary) hypertension: Secondary | ICD-10-CM | POA: Diagnosis present

## 2020-01-19 DIAGNOSIS — E119 Type 2 diabetes mellitus without complications: Secondary | ICD-10-CM | POA: Diagnosis not present

## 2020-01-19 DIAGNOSIS — Z9221 Personal history of antineoplastic chemotherapy: Secondary | ICD-10-CM

## 2020-01-19 DIAGNOSIS — J9601 Acute respiratory failure with hypoxia: Secondary | ICD-10-CM

## 2020-01-19 DIAGNOSIS — F419 Anxiety disorder, unspecified: Secondary | ICD-10-CM | POA: Diagnosis present

## 2020-01-19 DIAGNOSIS — Z7984 Long term (current) use of oral hypoglycemic drugs: Secondary | ICD-10-CM | POA: Diagnosis not present

## 2020-01-19 DIAGNOSIS — E7800 Pure hypercholesterolemia, unspecified: Secondary | ICD-10-CM | POA: Diagnosis present

## 2020-01-19 DIAGNOSIS — R0602 Shortness of breath: Secondary | ICD-10-CM

## 2020-01-19 DIAGNOSIS — L508 Other urticaria: Secondary | ICD-10-CM | POA: Diagnosis present

## 2020-01-19 HISTORY — DX: Type 2 diabetes mellitus without complications: E11.9

## 2020-01-19 LAB — CBG MONITORING, ED: Glucose-Capillary: 153 mg/dL — ABNORMAL HIGH (ref 70–99)

## 2020-01-19 LAB — BASIC METABOLIC PANEL
Anion gap: 10 (ref 5–15)
BUN: 33 mg/dL — ABNORMAL HIGH (ref 8–23)
CO2: 28 mmol/L (ref 22–32)
Calcium: 8.7 mg/dL — ABNORMAL LOW (ref 8.9–10.3)
Chloride: 101 mmol/L (ref 98–111)
Creatinine, Ser: 0.91 mg/dL (ref 0.44–1.00)
GFR, Estimated: >60 mL/min (ref >60)
Glucose, Bld: 218 mg/dL — ABNORMAL HIGH (ref 70–99)
Potassium: 3.9 mmol/L (ref 3.5–5.1)
Sodium: 139 mmol/L (ref 135–145)

## 2020-01-19 LAB — TROPONIN I (HIGH SENSITIVITY)
Troponin I (High Sensitivity): 8 ng/L (ref <18)
Troponin I (High Sensitivity): 6 ng/L (ref <18)

## 2020-01-19 LAB — MAGNESIUM: Magnesium: 2 mg/dL (ref 1.7–2.4)

## 2020-01-19 LAB — RESP PANEL BY RT-PCR (FLU A&B, COVID) ARPGX2
Influenza A by PCR: NEGATIVE
Influenza B by PCR: NEGATIVE
SARS Coronavirus 2 by RT PCR: NEGATIVE

## 2020-01-19 LAB — CBC
HCT: 35.7 % — ABNORMAL LOW (ref 36.0–46.0)
Hemoglobin: 12 g/dL (ref 12.0–15.0)
MCH: 32.7 pg (ref 26.0–34.0)
MCHC: 33.6 g/dL (ref 30.0–36.0)
MCV: 97.3 fL (ref 80.0–100.0)
Platelets: 323 10*3/uL (ref 150–400)
RBC: 3.67 MIL/uL — ABNORMAL LOW (ref 3.87–5.11)
RDW: 12.4 % (ref 11.5–15.5)
WBC: 10.9 10*3/uL — ABNORMAL HIGH (ref 4.0–10.5)
nRBC: 0 % (ref 0.0–0.2)

## 2020-01-19 LAB — BRAIN NATRIURETIC PEPTIDE: B Natriuretic Peptide: 64.7 pg/mL (ref 0.0–100.0)

## 2020-01-19 LAB — FIBRIN DERIVATIVES D-DIMER (ARMC ONLY): Fibrin derivatives D-dimer (ARMC): 1083.78 ng/mL (FEU) — ABNORMAL HIGH (ref 0.00–499.00)

## 2020-01-19 LAB — PROCALCITONIN: Procalcitonin: <0.1 ng/mL

## 2020-01-19 MED ORDER — SODIUM CHLORIDE 0.9% FLUSH
3.0000 mL | Freq: Two times a day (BID) | INTRAVENOUS | Status: DC
  Administered 2020-01-20 – 2020-01-21 (×4): 3 mL via INTRAVENOUS

## 2020-01-19 MED ORDER — PREDNISONE 20 MG PO TABS
40.0000 mg | ORAL_TABLET | Freq: Every day | ORAL | Status: DC
  Administered 2020-01-21: 10:00:00 40 mg via ORAL
  Filled 2020-01-19: qty 2

## 2020-01-19 MED ORDER — SODIUM CHLORIDE 0.9 % IV SOLN
1.0000 g | Freq: Once | INTRAVENOUS | Status: AC
  Administered 2020-01-19: 19:00:00 1 g via INTRAVENOUS
  Filled 2020-01-19: qty 10

## 2020-01-19 MED ORDER — PREDNISONE 20 MG PO TABS
40.0000 mg | ORAL_TABLET | Freq: Once | ORAL | Status: AC
  Administered 2020-01-19: 19:00:00 40 mg via ORAL
  Filled 2020-01-19: qty 2

## 2020-01-19 MED ORDER — VITAMIN B-12 1000 MCG PO TABS
1000.0000 ug | ORAL_TABLET | Freq: Every day | ORAL | Status: DC
  Administered 2020-01-20 – 2020-01-21 (×2): 1000 ug via ORAL
  Filled 2020-01-19 (×3): qty 1

## 2020-01-19 MED ORDER — INSULIN ASPART 100 UNIT/ML ~~LOC~~ SOLN
0.0000 [IU] | SUBCUTANEOUS | Status: DC
  Administered 2020-01-19: 20:00:00 3 [IU] via SUBCUTANEOUS

## 2020-01-19 MED ORDER — IPRATROPIUM-ALBUTEROL 0.5-2.5 (3) MG/3ML IN SOLN
9.0000 mL | Freq: Once | RESPIRATORY_TRACT | Status: AC
  Administered 2020-01-19: 19:00:00 9 mL via RESPIRATORY_TRACT
  Filled 2020-01-19: qty 9

## 2020-01-19 MED ORDER — ZOLPIDEM TARTRATE 5 MG PO TABS
5.0000 mg | ORAL_TABLET | Freq: Every evening | ORAL | Status: DC | PRN
  Administered 2020-01-19 – 2020-01-20 (×2): 5 mg via ORAL
  Filled 2020-01-19 (×2): qty 1

## 2020-01-19 MED ORDER — IPRATROPIUM-ALBUTEROL 0.5-2.5 (3) MG/3ML IN SOLN
3.0000 mL | Freq: Four times a day (QID) | RESPIRATORY_TRACT | Status: DC
  Administered 2020-01-19 – 2020-01-21 (×7): 3 mL via RESPIRATORY_TRACT
  Filled 2020-01-19 (×6): qty 3

## 2020-01-19 MED ORDER — SODIUM CHLORIDE 0.9 % IV SOLN
500.0000 mg | Freq: Once | INTRAVENOUS | Status: AC
  Administered 2020-01-19: 20:00:00 500 mg via INTRAVENOUS
  Filled 2020-01-19: qty 500

## 2020-01-19 MED ORDER — ATORVASTATIN CALCIUM 20 MG PO TABS
40.0000 mg | ORAL_TABLET | Freq: Every evening | ORAL | Status: DC
  Administered 2020-01-19 – 2020-01-20 (×2): 40 mg via ORAL
  Filled 2020-01-19 (×2): qty 2

## 2020-01-19 MED ORDER — PROPRANOLOL HCL 20 MG PO TABS
10.0000 mg | ORAL_TABLET | Freq: Two times a day (BID) | ORAL | Status: DC
  Administered 2020-01-20 – 2020-01-21 (×3): 10 mg via ORAL
  Filled 2020-01-19 (×3): qty 1

## 2020-01-19 MED ORDER — ALBUTEROL SULFATE HFA 108 (90 BASE) MCG/ACT IN AERS
2.0000 | INHALATION_SPRAY | RESPIRATORY_TRACT | Status: DC | PRN
  Filled 2020-01-19 (×2): qty 6.7

## 2020-01-19 MED ORDER — ENOXAPARIN SODIUM 60 MG/0.6ML ~~LOC~~ SOLN
0.5000 mg/kg | SUBCUTANEOUS | Status: DC
  Administered 2020-01-19 – 2020-01-20 (×2): 45 mg via SUBCUTANEOUS
  Filled 2020-01-19 (×3): qty 0.6

## 2020-01-19 MED ORDER — VENLAFAXINE HCL ER 37.5 MG PO CP24
37.5000 mg | ORAL_CAPSULE | Freq: Every day | ORAL | Status: DC
  Administered 2020-01-20 – 2020-01-21 (×2): 37.5 mg via ORAL
  Filled 2020-01-19 (×2): qty 1

## 2020-01-19 MED ORDER — SODIUM CHLORIDE 0.9% FLUSH: 3.0000 mL | INTRAVENOUS | Status: DC | PRN

## 2020-01-19 MED ORDER — INSULIN ASPART 100 UNIT/ML ~~LOC~~ SOLN
0.0000 [IU] | Freq: Three times a day (TID) | SUBCUTANEOUS | Status: DC
  Administered 2020-01-20: 17:00:00 20 [IU] via SUBCUTANEOUS
  Administered 2020-01-20 (×2): 11 [IU] via SUBCUTANEOUS
  Administered 2020-01-21: 10:00:00 3 [IU] via SUBCUTANEOUS
  Filled 2020-01-19 (×4): qty 1

## 2020-01-19 MED ORDER — IOHEXOL 350 MG/ML SOLN
75.0000 mL | Freq: Once | INTRAVENOUS | Status: DC | PRN
  Filled 2020-01-19: qty 75

## 2020-01-19 MED ORDER — AMLODIPINE BESYLATE 5 MG PO TABS
5.0000 mg | ORAL_TABLET | Freq: Every day | ORAL | Status: DC
  Administered 2020-01-20 – 2020-01-21 (×2): 5 mg via ORAL
  Filled 2020-01-19 (×2): qty 1

## 2020-01-19 MED ORDER — METHYLPREDNISOLONE SODIUM SUCC 40 MG IJ SOLR
40.0000 mg | Freq: Two times a day (BID) | INTRAMUSCULAR | Status: AC
  Administered 2020-01-19 – 2020-01-20 (×2): 40 mg via INTRAVENOUS
  Filled 2020-01-19 (×2): qty 1

## 2020-01-19 MED ORDER — LOSARTAN POTASSIUM 50 MG PO TABS
50.0000 mg | ORAL_TABLET | Freq: Every day | ORAL | Status: DC
  Administered 2020-01-20 – 2020-01-21 (×2): 50 mg via ORAL
  Filled 2020-01-19 (×2): qty 1

## 2020-01-19 MED ORDER — MAGNESIUM OXIDE 400 (241.3 MG) MG PO TABS
400.0000 mg | ORAL_TABLET | Freq: Two times a day (BID) | ORAL | Status: DC
  Administered 2020-01-20 – 2020-01-21 (×3): 400 mg via ORAL
  Filled 2020-01-19 (×3): qty 1

## 2020-01-19 MED ORDER — INSULIN DETEMIR 100 UNIT/ML ~~LOC~~ SOLN
10.0000 [IU] | Freq: Every day | SUBCUTANEOUS | Status: DC
  Administered 2020-01-20: 01:00:00 10 [IU] via SUBCUTANEOUS
  Filled 2020-01-19 (×2): qty 0.1

## 2020-01-19 MED ORDER — SODIUM CHLORIDE 0.9 % IV SOLN: 250.0000 mL | INTRAVENOUS | Status: DC | PRN

## 2020-01-19 MED ORDER — LORAZEPAM 1 MG PO TABS
1.0000 mg | ORAL_TABLET | Freq: Two times a day (BID) | ORAL | Status: DC | PRN
  Administered 2020-01-20: 14:00:00 1 mg via ORAL
  Filled 2020-01-19: qty 1

## 2020-01-19 MED ORDER — VENLAFAXINE HCL ER 150 MG PO CP24
150.0000 mg | ORAL_CAPSULE | Freq: Every day | ORAL | Status: DC
  Administered 2020-01-20 – 2020-01-21 (×2): 150 mg via ORAL
  Filled 2020-01-19 (×2): qty 1

## 2020-01-19 MED ORDER — ALBUTEROL SULFATE (2.5 MG/3ML) 0.083% IN NEBU: 2.5000 mg | INHALATION_SOLUTION | RESPIRATORY_TRACT | Status: DC | PRN

## 2020-01-19 MED ORDER — ASPIRIN EC 81 MG PO TBEC
81.0000 mg | DELAYED_RELEASE_TABLET | Freq: Every evening | ORAL | Status: DC
  Administered 2020-01-20: 17:00:00 81 mg via ORAL
  Filled 2020-01-19: qty 1

## 2020-01-19 MED ORDER — AZITHROMYCIN 250 MG PO TABS
250.0000 mg | ORAL_TABLET | Freq: Every day | ORAL | Status: DC
  Administered 2020-01-20 – 2020-01-21 (×2): 250 mg via ORAL
  Filled 2020-01-19 (×2): qty 1

## 2020-01-19 NOTE — ED Notes (Signed)
Medication Reconciliation Report  For Home History Technicians  HIGHLIGHTS:  1. The patient WAS personally interviewed 2. If not, what was the main source used: NOT APPLICABLE 3. Does the patient appear to take any anti-coagulation agents (e.g. warfarin, Eliquis or Xarelto): NO 4. Does the patient appear to take any anti-convulsant agents (e.g. divalproex, levetiracetam or phenytoin): NO 5. Does the patient appear to use any insulin products (e.g. Lantus, Novolin or Humalog): NO 6. Does the patient appear to take any "beta-blockers" (e.g. metoprolol, carvedilol or bisoprolol: YES  BARRIERS:  1. Were there any barriers that prevented or complicated the medication reconciliation process: NO 2. If yes, what was the primary barrier encountered: None 3. Does the patient appear compliant with prescribed medications: NO 4. Does the patient express any barriers with compliance: NO 5. What is the primary barrier the patient reports: None   NOTES:[Include any concerns, remarks or complaints the patient expresses regarding medication therapy. Any observations or other information that might be useful to the treatment team can also be included. Immediate needs or concerns should be referred to the RN or appropriate member of the treatment team.]  Patient was interviewed and reported concern that LORAZEPAM was causing her shortness of breath. She also reports NOT taking PREGABALIN or CELECOXIB.  Colen Darling, CPhT [place] at [facility] [place]. [place], [place] [phone]  ** The above is intended solely for informational and/or communicative purposes. It should in no way be considered an endorsement of any specific treatment, therapy or action. **

## 2020-01-19 NOTE — ED Triage Notes (Signed)
Increasing sob since she was discharged from ED yesterday.  Pt is spo2 82% on RA in triage.  Applied 2L [place] and spo2 increased to 90% on 2l [place].  Pt reports that she is covid negative and has COPD.

## 2020-01-19 NOTE — H&P (Signed)
History and Physical    Kayla Wu:[id number] DOB: 1945/05/12 DOA: 01/19/2020  PCP: Idelle Crouch, MD   Patient coming from: Home  I have personally briefly reviewed patient's old medical records in [place]  Chief Complaint: Shortness of breath  HPI: Kayla Wu is a 74 y.o. female with medical history significant for COPD, hypertension, ovarian cancer status post chemotherapy and GERD who presents to the emergency room for evaluation of worsening shortness of breath mostly with exertion  associated with a wet sounding cough (not productive at this time) and hypoxia.  Patient checked her pulse oximetry at home and noted that her oxygen level was in the 70s. She was seen in the emergency room on 01/18/20 and was offered admission at that time for acute COPD exacerbation with respiratory failure.  She had room air pulse oximetry of 82% that improved following oxygen supplementation at 2 L.  She declined admission and opted to go home and come back if her symptoms worsened. She came back to the ER 24 hours later due to worsening shortness of breath and hypoxia with room air pulse oximetry in the 70s. She denies having any chest pain, no fever, no chills, no nausea, no vomiting, no abdominal pain or any changes in her bowel habits, no urinary symptoms, no dysuria, no frequency, no headache, no orthopnea, no palpitations, no diaphoresis. Labs show sodium 139, potassium 3.9, chloride 101, bicarb 28, glucose 218, BUN 33, creatinine 0.9, calcium 9.7, magnesium 2.0, BNP 64.7, troponin VIII, procalcitonin less than 0.10, white count 10.9, hemoglobin 12.0, hematocrit 35.7, MCV 97.3, RDW 12.4, platelet count 323, fibrin diabetes 1083 Respiratory viral panel is negative Chest x-ray reviewed by me shows bibasilar, right greater than left, opacities could represent infection/inflammation.     ED Course: Patient is a 74 year old female with a history of COPD who presents to the  emergency room for evaluation of worsening shortness of breath associated with cough and hypoxia.  She had signed out [employer] from the emergency room on 01/18/20 for similar symptoms and advised to come back to the ER if her symptoms worsened.  She remains hypoxic with room air pulse oximetry in the 70s that improved with oxygen supplementation.  Chest x-ray shows bibasilar opacities but her COVID-19 PCR test is negative.  She will be admitted to the hospital for further evaluation.    Review of Systems: As per HPI otherwise all negative.    Past Medical History:  Diagnosis Date  . Chronic urticaria   . COPD (chronic obstructive pulmonary disease) ([place])   . DDD (degenerative disc disease)   . Diabetes mellitus ([place])   . GERD (gastroesophageal reflux disease)   . Hypertension   . Menopausal state   . Non-cardiac chest pain   . Ovarian cancer ([place]) 2012   s/p chemo   . Personal history of chemotherapy 2012   ovarian  . Pneumonia 1987    Past Surgical History:  Procedure Laterality Date  . APPENDECTOMY    . CHOLECYSTECTOMY    . FINGER ARTHROPLASTY Left 12/22/2014   Procedure: FINGER ARTHROPLASTY;  Surgeon: [name], MD;  Location: [facility] ORS;  Service: Orthopedics;  Laterality: Left;  . hysterectomy       reports that she quit smoking about 12 years ago. Her smoking use included cigarettes. She has a 84.00 pack-year smoking history. She has never used smokeless tobacco. She reports that she does not drink alcohol and does not use drugs.  Allergies  Allergen Reactions  . Iodinated Diagnostic Agents Hives, Shortness Of Breath and Itching    Patient states she had itching, hives, and SOB after injection. She states she was given Benadryl before and after injection and still had reaction.    Family History  Problem Relation Age of Onset  . Emphysema Father        smoked  . Breast cancer Mother 31  . Colon cancer Sister      Prior to Admission  medications   Medication Sig Start Date End Date Taking? Authorizing Provider  amLODipine (NORVASC) 5 MG tablet Take 5 mg by mouth daily. 01/18/20  Yes [provider]  aspirin 81 MG tablet Take 81 mg by mouth every evening.   Yes [provider]  atorvastatin (LIPITOR) 40 MG tablet Take 40 mg by mouth every evening.   Yes [provider]  azithromycin (ZITHROMAX Z-PAK) 250 MG tablet Take 2 tablets (500 mg) on  Day 1,  followed by 1 tablet (250 mg) once daily on Days 2 through 5. 01/18/20 01/23/20 Yes Nena Polio, MD  cephALEXin (KEFLEX) 500 MG capsule Take 1 capsule (500 mg total) by mouth 3 (three) times daily for 10 days. 01/18/20 01/28/20 Yes Nena Polio, MD  glimepiride (AMARYL) 2 MG tablet Take 2 mg by mouth 2 (two) times daily.   Yes [provider]  LORazepam (ATIVAN) 1 MG tablet Take 1 mg by mouth 2 (two) times daily as needed for anxiety or sleep. 12/14/19  Yes [provider]  losartan (COZAAR) 50 MG tablet Take 50 mg by mouth daily.   Yes [provider]  magnesium oxide (MAG-OX) 400 MG tablet Take 400 mg by mouth 2 (two) times daily.   Yes [provider]  metFORMIN (GLUCOPHAGE-XR) 500 MG 24 hr tablet Take 1,500 mg by mouth daily with supper.   Yes [provider]  propranolol (INDERAL) 10 MG tablet Take 10 mg by mouth 2 (two) times daily. 01/16/20  Yes [provider]  venlafaxine XR (EFFEXOR-XR) 150 MG 24 hr capsule Take 150 mg by mouth daily. (take with 37.5mg  capsule to equal 187.5mg  total)   Yes [provider]  venlafaxine XR (EFFEXOR-XR) 37.5 MG 24 hr capsule Take 37.5 mg by mouth daily. (take with 150mg  capsule to equal 187.5mg  total) 01/09/20  Yes [provider]  vitamin B-12 (CYANOCOBALAMIN) 1000 MCG tablet Take 1,000 mcg by mouth daily.   Yes [provider]  zolpidem (AMBIEN) 10 MG tablet Take 10 mg by mouth at bedtime as needed for sleep.   Yes [provider]    Physical Exam: Vitals:   01/19/20 1723 01/19/20 1841 01/19/20 1900 01/19/20 1915  BP: 121/61 (!) 121/55 (!) 117/42   Pulse: 74 68 67   Resp: 12 16 18    Temp: 97.6 F (36.4 C) 97.6 F (36.4 C)    TempSrc: Oral Oral    SpO2: 100% 99% 97% 99%     Vitals:   01/19/20 1723 01/19/20 1841 01/19/20 1900 01/19/20 1915  BP: 121/61 (!) 121/55 (!) 117/42   Pulse: 74 68 67   Resp: 12 16 18    Temp: 97.6 F (36.4 C) 97.6 F (36.4 C)    TempSrc: Oral Oral    SpO2: 100% 99% 97% 99%    Constitutional: NAD, alert and oriented x 3. Appears comfortable and in no distress Eyes: PERRL, lids and conjunctivae normal ENMT: Mucous membranes are moist.  Neck: normal, supple, no masses, no  thyromegaly Respiratory: Bilateral air entry, scattered wheezing, no crackles. Normal respiratory effort. No accessory muscle use.  Cardiovascular: Regular rate and rhythm, no murmurs / rubs / gallops. No extremity edema. 2+ pedal pulses. No carotid bruits.  Abdomen: no tenderness, no masses palpated. No hepatosplenomegaly. Bowel sounds positive.  Musculoskeletal: no clubbing / cyanosis. No joint deformity upper and lower extremities.  Skin: no rashes, lesions, ulcers.  Neurologic: No gross focal neurologic deficit. Psychiatric: Normal mood and affect.   Labs on Admission: I have personally reviewed following labs and imaging studies  CBC: Recent Labs  Lab 01/18/20 1052 01/19/20 1451  WBC 16.9*  16.9* 10.9*  NEUTROABS 13.0*  --   HGB 13.6  13.5 12.0  HCT 40.0  39.4 35.7*  MCV 96.9  95.2 97.3  PLT 409*  383 921   Basic Metabolic Panel: Recent Labs  Lab 01/18/20 1052 01/19/20 1451  NA 138 139  K 4.3 3.9  CL 98 101  CO2 25 28  GLUCOSE 208* 218*  BUN 25* 33*  CREATININE 0.89 0.91  CALCIUM 9.2 8.7*  MG  --  2.0   GFR: Estimated Creatinine Clearance: 57.1 mL/min (by C-G formula based on SCr of 0.91 mg/dL). Liver Function Tests: Recent Labs  Lab 01/18/20 1052  AST 34   ALT 23  ALKPHOS 99  BILITOT 0.8  PROT 7.8  ALBUMIN 4.1   Recent Labs  Lab 01/18/20 1052  LIPASE 25   No results for input(s): AMMONIA in the last 168 hours. Coagulation Profile: No results for input(s): INR, PROTIME in the last 168 hours. Cardiac Enzymes: No results for input(s): CKTOTAL, CKMB, CKMBINDEX, TROPONINI in the last 168 hours. BNP (last 3 results) No results for input(s): PROBNP in the last 8760 hours. HbA1C: No results for input(s): HGBA1C in the last 72 hours. CBG: Recent Labs  Lab 01/19/20 1927  GLUCAP 153*   Lipid Profile: No results for input(s): CHOL, HDL, LDLCALC, TRIG, CHOLHDL, LDLDIRECT in the last 72 hours. Thyroid Function Tests: No results for input(s): TSH, T4TOTAL, FREET4, T3FREE, THYROIDAB in the last 72 hours. Anemia Panel: No results for input(s): VITAMINB12, FOLATE, FERRITIN, TIBC, IRON, RETICCTPCT in the last 72 hours. Urine analysis:    Component Value Date/Time   COLORURINE AMBER (A) 01/18/2020 1052   APPEARANCEUR CLOUDY (A) 01/18/2020 1052   LABSPEC 1.027 01/18/2020 1052   PHURINE 5.0 01/18/2020 1052   GLUCOSEU NEGATIVE 01/18/2020 1052   HGBUR NEGATIVE 01/18/2020 1052   BILIRUBINUR SMALL (A) 01/18/2020 1052   KETONESUR 5 (A) 01/18/2020 1052   PROTEINUR >=300 (A) 01/18/2020 1052   NITRITE NEGATIVE 01/18/2020 1052   LEUKOCYTESUR SMALL (A) 01/18/2020 1052    Radiological Exams on Admission: CT ABDOMEN PELVIS WO CONTRAST  Result Date: 01/18/2020 CLINICAL DATA:  Nausea, vomiting, abdominal pain EXAM: CT ABDOMEN AND PELVIS WITHOUT CONTRAST TECHNIQUE: Multidetector CT imaging of the abdomen and pelvis was performed following the standard protocol without IV contrast. COMPARISON:  12/07/2010 FINDINGS: Lower chest: Scarring in the lung bases.  Small hiatal hernia. Hepatobiliary: No focal liver abnormality is seen. Status post cholecystectomy. No biliary dilatation. Pancreas: No focal abnormality or ductal dilatation. Spleen: No focal  abnormality.  Normal size. Adrenals/Urinary Tract: Punctate nonobstructing stone in the midpole of the right kidney. No hydronephrosis. No renal or adrenal mass. Urinary bladder unremarkable. Stomach/Bowel: Postoperative changes in the rectosigmoid colon. Scattered colonic diverticulosis. No active diverticulitis. Postoperative changes in mid small bowel. No evidence of bowel obstruction. Vascular/Lymphatic: Aortic atherosclerosis. No evidence of aneurysm or  adenopathy. Reproductive: Prior hysterectomy.  No adnexal masses. Other: No free fluid or free air. Musculoskeletal: No acute bony abnormality. IMPRESSION: No acute findings in the abdomen or pelvis. Aortic atherosclerosis. Punctate right midpole nephrolithiasis. Electronically Signed   By: Rolm Baptise M.D.   On: 01/18/2020 18:55   DG Chest Portable 1 View  Result Date: 01/18/2020 CLINICAL DATA:  Shortness of breath, low O2 saturation decreased to 80% on room air. COPD, hypertension. EXAM: PORTABLE CHEST 1 VIEW COMPARISON:  Chest x-ray 12/07/2010 FINDINGS: Right chest wall Port-A-Cath with tip overlying the expected region of the distal superior vena cava. The heart size and mediastinal contours are unchanged. Interval development of right base patchy interstitial and airspace opacity. Slight interstitial prominence at the left base. No pulmonary edema. No pleural effusion. No pneumothorax. No acute osseous abnormality. Punctate metallic densities overlying the right upper abdomen. IMPRESSION: Bibasilar, right greater than left, opacities could represent infection/inflammation. Electronically Signed   By: Iven Finn M.D.   On: 01/18/2020 16:00    EKG: Independently reviewed.   Assessment/Plan Principal Problem:   COPD with acute exacerbation (HCC) Active Problems:   GERD (gastroesophageal reflux disease)   Benign essential HTN   Diabetes mellitus (HCC)    COPD with acute exacerbation and acute respiratory failure Place patient on  scheduled and as needed bronchodilator therapy Place patient on systemic steroids Complete course of antibiotic therapy, patient was started on a Z-Pak Patient noted to have room air pulse oximetry in the 70s with tachypnea and conversational dyspnea.  Pulse oximetry has improved to the 90s on 2 L of oxygen She will need to be assessed for home oxygen need prior to discharge     Diabetes mellitus Hold oral hypoglycemic agents Start patient on Levemir 10 units at bedtime Sliding scale insulin for glycemic control with Accu-Cheks before meals and at bedtime   Obesity Complicates overall prognosis and care    Hypertension Continue Cozaar, amlodipine and propranolol    Anxiety and depression Continue as needed lorazepam and venlafaxine    DVT prophylaxis: Lovenox Code Status: DNR Family Communication: Greater than 50% of time was spent discussing plan of care with patient at the bedside. All questions and concerns have been addressed. She verbalizes understanding and agrees with the plan. Disposition Plan: Back to previous home environment Consults called: None    Kaiven Vester MD [facility]     01/19/2020, 9:16 PM

## 2020-01-19 NOTE — ED Provider Notes (Addendum)
[facility] Emergency Department Provider Note  ____________________________________________   Event Date/Time   First MD Initiated Contact with Patient 01/19/20 1803     (approximate)  I have reviewed the triage vital signs and the nursing notes.   HISTORY  Chief Complaint Shortness of Breath (Spo2 82% on RA)   HPI Kayla Wu is a 74 y.o. female with a past medical history of HTN, ovarian cancer status post chemotherapy, GERD, DDD, and COPD not on oxygen at baseline who presents for assessment of some persistent shortness of breath and cough as well as oxygen levels measured in the 70s at home.  Patient notes she was here yesterday evening but left against advice after she initially presented with some cough shortness of breath that was diagnosed as COPD exacerbation.  Patient states she is not wheezing her inhaler at home but came back to emergency room because she wanted to get checked out for her she should have stayed.  He states she also had some abdominal pain yesterday but that has gotten better.  She states she initially developed her cough and shortness of breath 2 days ago.  She denies any fevers, chest pain, nausea, vomiting, diarrhea, dysuria, rash or extremity pain.         Past Medical History:  Diagnosis Date  . Chronic urticaria   . COPD (chronic obstructive pulmonary disease) ([place])   . DDD (degenerative disc disease)   . GERD (gastroesophageal reflux disease)   . Hypertension   . Menopausal state   . Non-cardiac chest pain   . Ovarian cancer ([place]) 2012   s/p chemo   . Personal history of chemotherapy 2012   ovarian  . Pneumonia 1987    Patient Active Problem List   Diagnosis Date Noted  . Encounter for antineoplastic chemotherapy 03/16/2015  . 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 Blood glucose elevated 10/11/2014  . Breathlessness on exertion 07/19/2014  . Chronic  obstructive pulmonary disease ([place]) 04/20/2013  . Narrowing of intervertebral disc space 04/20/2013  . Benign essential HTN 04/20/2013  . HLD (hyperlipidemia) 04/20/2013  . Benign hypertension 04/20/2013  . Panlobular emphysema ([place]) 04/20/2013  . Pure hypercholesterolemia 04/20/2013  . COPD pft's pending 07/08/2012  . Chronic obstructive pulmonary disease with acute exacerbation ([place]) 07/08/2012  . 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 Acid reflux 01/09/2011  . Malignant neoplasm of ovary ([place]) 12/16/2010    Past Surgical History:  Procedure Laterality Date  . APPENDECTOMY    . CHOLECYSTECTOMY    . FINGER ARTHROPLASTY Left 12/22/2014   Procedure: FINGER ARTHROPLASTY;  Surgeon: [name], MD;  Location: [facility] ORS;  Service: Orthopedics;  Laterality: Left;  . hysterectomy      Prior to Admission medications   Medication Sig Start Date End Date Taking? Authorizing Provider  aspirin 81 MG tablet Take 81 mg by mouth daily. In pm    [provider]  atorvastatin (LIPITOR) 40 MG tablet Take 40 mg by mouth daily at 6 PM.    [provider]  azithromycin (ZITHROMAX Z-PAK) 250 MG tablet Take 2 tablets (500 mg) on  Day 1,  followed by 1 tablet (250 mg) once daily on Days 2 through 5. 01/18/20 01/23/20  Nena Polio, MD  calcium carbonate (OS-CAL) 600 MG TABS Take 600 mg by mouth daily.    [provider]  cephALEXin (KEFLEX) 500 MG capsule Take 1 capsule (500 mg total) by  mouth 3 (three) times daily for 10 days. 01/18/20 01/28/20  Nena Polio, MD  desvenlafaxine (PRISTIQ) 100 MG 24 hr tablet Take 100 mg by mouth daily.    [provider]  ferrous sulfate 325 (65 FE) MG tablet Take 325 mg by mouth daily with breakfast.    [provider]  hydrOXYzine (ATARAX/VISTARIL) 50 MG tablet Take 50 mg by mouth 3 (three) times daily as needed for itching.    [provider]  losartan (COZAAR) 50 MG tablet Take 50 mg by mouth  daily. In am    [provider]  NEOMYCIN-POLYMYXIN-HYDROCORTISONE (CORTISPORIN) 1 % SOLN otic solution Apply 1-2 drops to toe BID after soaking 06/25/15   Hyatt, Max T, DPM  omeprazole (PRILOSEC) 40 MG capsule Take 40 mg by mouth daily. In am    [provider]  primidone (MYSOLINE) 50 MG tablet Take by mouth. 03/28/15 06/26/15  [provider]  venlafaxine XR (EFFEXOR-XR) 150 MG 24 hr capsule  01/30/15   [provider]  vitamin B-12 (CYANOCOBALAMIN) 250 MCG tablet Take 250 mcg by mouth daily.    [provider]  zolpidem (AMBIEN) 10 MG tablet Take 10 mg by mouth at bedtime as needed for sleep.    [provider]    Allergies Patient has no known allergies.  Family History  Problem Relation Age of Onset  . Emphysema Father        smoked  . Breast cancer Mother 35  . Colon cancer Sister     Social History Social History   Tobacco Use  . Smoking status: Former Smoker    Packs/day: 2.00    Years: 42.00    Pack years: 84.00    Types: Cigarettes    Quit date: 07/05/2007    Years since quitting: 12.5  . Smokeless tobacco: Never Used  Substance Use Topics  . Alcohol use: No    Comment: occasional glass of wine once a month  . Drug use: No    Review of Systems  Review of Systems  Constitutional: Negative for chills and fever.  HENT: Negative for sore throat.   Eyes: Negative for pain.  Respiratory: Positive for cough and shortness of breath. Negative for stridor.   Cardiovascular: Negative for chest pain.  Gastrointestinal: Negative for vomiting.  Genitourinary: Negative for dysuria.  Musculoskeletal: Negative for myalgias.  Skin: Negative for rash.  Neurological: Negative for seizures, loss of consciousness and headaches.  Psychiatric/Behavioral: Negative for suicidal ideas.  All other systems reviewed and are negative.     ____________________________________________   PHYSICAL EXAM:  VITAL SIGNS: ED Triage  Vitals  Enc Vitals Group     BP 01/19/20 1436 (!) 134/59     Pulse Rate 01/19/20 1436 75     Resp 01/19/20 1436 (!) 22     Temp 01/19/20 1436 98.6 F (37 C)     Temp Source 01/19/20 1436 Oral     SpO2 01/19/20 1436 (!) 82 %     Weight --      Height --      Head Circumference --      Peak Flow --      Pain Score 01/19/20 1438 0     Pain Loc --      Pain Edu? --      Excl. in [place]? --    Vitals:   01/19/20 1900 01/19/20 1915  BP: (!) 117/42   Pulse: 67   Resp: 18   Temp:  SpO2: 97% 99%   Physical Exam Vitals and nursing note reviewed.  Constitutional:      General: She is not in acute distress.    Appearance: She is well-developed and well-nourished.  HENT:     Head: Normocephalic and atraumatic.     Right Ear: External ear normal.     Left Ear: External ear normal.     Nose: Nose normal.  Eyes:     Conjunctiva/sclera: Conjunctivae normal.  Cardiovascular:     Rate and Rhythm: Normal rate and regular rhythm.     Heart sounds: No murmur heard.   Pulmonary:     Effort: Pulmonary effort is normal. No respiratory distress.     Breath sounds: Decreased breath sounds and wheezing present.  Abdominal:     Palpations: Abdomen is soft.     Tenderness: There is no abdominal tenderness.  Musculoskeletal:        General: No edema.     Cervical back: Neck supple.     Right lower leg: No edema.     Left lower leg: No edema.  Skin:    General: Skin is warm and dry.     Capillary Refill: Capillary refill takes less than 2 seconds.  Neurological:     Mental Status: She is alert and oriented to person, place, and time.  Psychiatric:        Mood and Affect: Mood and affect and mood normal.      ____________________________________________   LABS (all labs ordered are listed, but only abnormal results are displayed)  Labs Reviewed  BASIC METABOLIC PANEL - Abnormal; Notable for the following components:      Result Value   Glucose, Bld 218 (*)    BUN 33 (*)     Calcium 8.7 (*)    All other components within normal limits  CBC - Abnormal; Notable for the following components:   WBC 10.9 (*)    RBC 3.67 (*)    HCT 35.7 (*)    All other components within normal limits  RESP PANEL BY RT-PCR (FLU A&B, COVID) ARPGX2  BRAIN NATRIURETIC PEPTIDE  MAGNESIUM  HEMOGLOBIN A1C  FIBRIN DERIVATIVES D-DIMER ([facility] ONLY)  PROCALCITONIN  TROPONIN I (HIGH SENSITIVITY)   ____________________________________________  EKG  Sinus rhythm with a ventricular rate of 69, normal axis, unremarkable's, no clear evidence of acute ischemia or other significant arrhythmia. ____________________________________________  RADIOLOGY  ED MD interpretation: Chest x-ray obtained during ED visit yesterday evening was reviewed and showed revealed mild right opacity without effusion, edema, pneumothorax or other acute process.  Official radiology report(s): No results found.  ____________________________________________   PROCEDURES  Procedure(s) performed (including Critical Care):  .Critical Care Performed by: Lucrezia Starch, MD Authorized by: Lucrezia Starch, MD   Critical care provider statement:    Critical care time (minutes):  45   Critical care time was exclusive of:  Separately billable procedures and treating other patients   Critical care was necessary to treat or prevent imminent or life-threatening deterioration of the following conditions:  Respiratory failure   Critical care was time spent personally by me on the following activities:  Discussions with consultants, evaluation of patient's response to treatment, examination of patient, ordering and performing treatments and interventions, ordering and review of laboratory studies, ordering and review of radiographic studies, pulse oximetry, re-evaluation of patient's condition, obtaining history from patient or surrogate and review of old  charts     ____________________________________________   INITIAL IMPRESSION / ASSESSMENT AND  PLAN / ED COURSE      Patient presents above history exam for assessment of persistent cough and shortness of breath as well as hypoxia at home.  She was seen yesterday but left against advice with concern for COPD exacerbation.  Patient does not wear oxygen at baseline but is requiring 2 L on arrival she has an SPO2 of 87% that did improve to 99 on 2 L.  She has some diminished breath sounds and expiratory wheezing on exam.  Did review her x-ray which shows concern for possible pneumonia no other acute intrathoracic pathology from yesterday evening.  She denies any chest pain given nonelevated troponin as noted above with no ischemic changes on EKG below suspicion for ACS at this time.  BMP today shows hyperglycemia with glucose of 218 which suspect is related to recent steroid use patient has no history of diabetes although we will send A1c.  No other significant ocular metabolic derangements.  CBC remarkable for WBC count of 10.9 with no other derangements.  No evidence of acute anemia.  BNP is 64 overall patient does not appear volume overloaded and I do not believe heart failure is contributing to her presentation at this time.  Magnesium is 2.  We will plan to send D-dimer to assess PE risk if is elevated obtain CTA.  Patient treated with antibiotics and duo nebs as well as steroids as noted below.  We will plan to admit to medicine service for hypoxic respiratory failure likely secondary to COPD exacerbation.  D-dimer returned elevated.  However patient does report an allergy to contrast will require premedication or VQ scan.  Given she does have wheezing I believe COPD exacerbation is more likely etiology at this time and I believe trial of 12 hours of DuoNeb's is reasonable.  If she fails to improve still hypoxic tomorrow morning will be reasonable to pursue a VQ scan at that  time.  ____________________________________________   FINAL CLINICAL IMPRESSION(S) / ED DIAGNOSES  Final diagnoses:  Acute respiratory failure with hypoxia (HCC)  COPD exacerbation (HCC)  Hyperglycemia    Medications  albuterol (VENTOLIN HFA) 108 (90 Base) MCG/ACT inhaler 2 puff (has no administration in time range)  insulin aspart (novoLOG) injection 0-15 Units (has no administration in time range)  cefTRIAXone (ROCEPHIN) 1 g in sodium chloride 0.9 % 100 mL IVPB (1 g Intravenous New Bag/Given 01/19/20 1906)  azithromycin (ZITHROMAX) 500 mg in sodium chloride 0.9 % 250 mL IVPB (has no administration in time range)  predniSONE (DELTASONE) tablet 40 mg (40 mg Oral Given 01/19/20 1900)  ipratropium-albuterol (DUONEB) 0.5-2.5 (3) MG/3ML nebulizer solution 9 mL (9 mLs Nebulization Given 01/19/20 1906)     ED Discharge Orders    None       Note:  This document was prepared using Dragon voice recognition software and may include unintentional dictation errors.   Lucrezia Starch, MD 01/19/20 Curly Rim    Lucrezia Starch, MD 01/19/20 1951    Lucrezia Starch, MD 01/19/20 2008

## 2020-01-20 ENCOUNTER — Encounter: Payer: Self-pay | Admitting: Internal Medicine

## 2020-01-20 ENCOUNTER — Inpatient Hospital Stay: Payer: Medicare HMO

## 2020-01-20 LAB — HEMOGLOBIN A1C
Hgb A1c MFr Bld: 6.8 % — ABNORMAL HIGH (ref 4.8–5.6)
Mean Plasma Glucose: 148.46 mg/dL

## 2020-01-20 LAB — BASIC METABOLIC PANEL
Anion gap: 11 (ref 5–15)
BUN: 38 mg/dL — ABNORMAL HIGH (ref 8–23)
CO2: 26 mmol/L (ref 22–32)
Calcium: 8.7 mg/dL — ABNORMAL LOW (ref 8.9–10.3)
Chloride: 100 mmol/L (ref 98–111)
Creatinine, Ser: 0.89 mg/dL (ref 0.44–1.00)
GFR, Estimated: >60 mL/min (ref >60)
Glucose, Bld: 267 mg/dL — ABNORMAL HIGH (ref 70–99)
Potassium: 4.7 mmol/L (ref 3.5–5.1)
Sodium: 137 mmol/L (ref 135–145)

## 2020-01-20 LAB — GLUCOSE, CAPILLARY
Glucose-Capillary: 226 mg/dL — ABNORMAL HIGH (ref 70–99)
Glucose-Capillary: 277 mg/dL — ABNORMAL HIGH (ref 70–99)
Glucose-Capillary: 356 mg/dL — ABNORMAL HIGH (ref 70–99)
Glucose-Capillary: 207 mg/dL — ABNORMAL HIGH (ref 70–99)

## 2020-01-20 LAB — CBC
HCT: 35.1 % — ABNORMAL LOW (ref 36.0–46.0)
Hemoglobin: 11.9 g/dL — ABNORMAL LOW (ref 12.0–15.0)
MCH: 33.1 pg (ref 26.0–34.0)
MCHC: 33.9 g/dL (ref 30.0–36.0)
MCV: 97.5 fL (ref 80.0–100.0)
Platelets: 291 10*3/uL (ref 150–400)
RBC: 3.6 MIL/uL — ABNORMAL LOW (ref 3.87–5.11)
RDW: 12.1 % (ref 11.5–15.5)
WBC: 8.3 10*3/uL (ref 4.0–10.5)
nRBC: 0 % (ref 0.0–0.2)

## 2020-01-20 LAB — HIV ANTIBODY (ROUTINE TESTING W REFLEX): HIV Screen 4th Generation wRfx: NONREACTIVE

## 2020-01-20 MED ORDER — CHLORHEXIDINE GLUCONATE CLOTH 2 % EX PADS
6.0000 | MEDICATED_PAD | Freq: Every day | CUTANEOUS | Status: DC
  Administered 2020-01-21: 10:00:00 6 via TOPICAL

## 2020-01-20 MED ORDER — ENSURE MAX PROTEIN PO LIQD
11.0000 [oz_av] | Freq: Every day | ORAL | Status: DC
  Administered 2020-01-21: 10:00:00 11 [oz_av] via ORAL
  Filled 2020-01-20: qty 330

## 2020-01-20 MED ORDER — TECHNETIUM TO 99M ALBUMIN AGGREGATED
4.4400 | Freq: Once | INTRAVENOUS | Status: AC | PRN
  Administered 2020-01-20: 14:00:00 4.44 via INTRAVENOUS

## 2020-01-20 MED ORDER — GLIMEPIRIDE 2 MG PO TABS
2.0000 mg | ORAL_TABLET | Freq: Two times a day (BID) | ORAL | Status: DC
  Administered 2020-01-20 – 2020-01-21 (×2): 2 mg via ORAL
  Filled 2020-01-20 (×5): qty 1

## 2020-01-20 MED ORDER — INSULIN DETEMIR 100 UNIT/ML ~~LOC~~ SOLN
12.0000 [IU] | Freq: Every day | SUBCUTANEOUS | Status: DC
  Administered 2020-01-21: 01:00:00 12 [IU] via SUBCUTANEOUS
  Filled 2020-01-20 (×2): qty 0.12

## 2020-01-20 MED ORDER — PNEUMOCOCCAL VAC POLYVALENT 25 MCG/0.5ML IJ INJ
0.5000 mL | INJECTION | INTRAMUSCULAR | Status: AC
  Administered 2020-01-21: 10:00:00 0.5 mL via INTRAMUSCULAR
  Filled 2020-01-20: qty 0.5

## 2020-01-20 MED ORDER — ORAL CARE MOUTH RINSE
15.0000 mL | Freq: Two times a day (BID) | OROMUCOSAL | Status: DC
  Administered 2020-01-21 (×2): 15 mL via OROMUCOSAL

## 2020-01-20 NOTE — Progress Notes (Addendum)
PT Cancellation Note  Patient Details Name: Kayla Wu MRN: [id number] DOB: Jul 26, 1945   Cancelled Treatment:    Reason Eval/Treat Not Completed: PT screened, no needs identified, will sign off. Pt up in room with husband in the middle of transferring to recliner in the room. Pt provided with yellow socks for safety, as well as education about need for staffing assist for mobility while in hospital, RN notified. Pt reported no current needs for acute physical therapy, independent at baseline and no concerns. PT to sign off, please re-consult if acute needs arise.   Pt had also doffed nasal cannula for mobility, replaced by this PT and educated about importance of continued use. SpO2 readings on room air 92%.  [name] PT, DPT 12:36 PM,01/20/20

## 2020-01-20 NOTE — TOC Initial Note (Signed)
Transition of Care [facility]) - Initial/Assessment Note    Patient Details  Name: Kayla Wu MRN: [id number] Date of Birth: 1945/02/12  Transition of Care [facility]) CM/SW Contact:    Shelbie Ammons, RN Phone Number: 01/20/2020, 3:13 PM  Clinical Narrative:     RNCM completed high risk assessment. Patient lives at home and she or her spouse provide their own transportation. Patient is current with her PCP Dr. Doy Hutching and gets her medications at [place]. No other needs identified at this time.                     Patient Goals and CMS Choice        Expected Discharge Plan and Services                                                Prior Living Arrangements/Services                       Activities of Daily Living Home Assistive Devices/Equipment: Eyeglasses,CBG Meter ADL Screening (condition at time of admission) Patient's cognitive ability adequate to safely complete daily activities?: Yes Is the patient deaf or have difficulty hearing?: No Does the patient have difficulty seeing, even when wearing glasses/contacts?: No Does the patient have difficulty concentrating, remembering, or making decisions?: No Patient able to express need for assistance with ADLs?: Yes Does the patient have difficulty dressing or bathing?: No Independently performs ADLs?: Yes (appropriate for developmental age) Does the patient have difficulty walking or climbing stairs?: No Weakness of Legs: None Weakness of Arms/Hands: None  Permission Sought/Granted                  Emotional Assessment              Admission diagnosis:  Hyperglycemia [R73.9] COPD exacerbation (HCC) [J44.1] Acute respiratory failure with hypoxia ([place]) [J96.01] COPD with acute exacerbation ([place]) [J44.1] Patient Active Problem List   Diagnosis Date Noted  . COPD with acute exacerbation ([place]) 01/19/2020  . Diabetes mellitus ([place])   . Encounter for antineoplastic chemotherapy  03/16/2015  . 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 Blood glucose elevated 10/11/2014  . Breathlessness on exertion 07/19/2014  . Chronic obstructive pulmonary disease ([place]) 04/20/2013  . Narrowing of intervertebral disc space 04/20/2013  . Benign essential HTN 04/20/2013  . HLD (hyperlipidemia) 04/20/2013  . Benign hypertension 04/20/2013  . Panlobular emphysema ([place]) 04/20/2013  . Pure hypercholesterolemia 04/20/2013  . COPD pft's pending 07/08/2012  . Chronic obstructive pulmonary disease with acute exacerbation ([place]) 07/08/2012  . 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 GERD (gastroesophageal reflux disease) 01/09/2011  . Malignant neoplasm of ovary ([place]) 12/16/2010   PCP:  Idelle Crouch, MD Pharmacy:   [facility] [address], [place] - [place] [address] [place] [address] Phone: [phone] Fax: [phone]     Social Determinants of Health (SDOH) Interventions    Readmission Risk Interventions Readmission Risk Prevention Plan 01/20/2020  Transportation Screening Complete  PCP or Specialist Appt within 5-7 Days Complete  Home Care Screening Complete  Medication Review (RN CM) Complete  Some recent data might be hidden

## 2020-01-20 NOTE — Progress Notes (Signed)
OT Cancellation Note  Patient Details Name: Kayla Wu MRN: [id number] DOB: November 01, 1945   Cancelled Treatment:    Reason Eval/Treat Not Completed: Medical issues which prohibited therapy;Other (comment). Order received and chart reviewed. Per chart/conversation with care team, pt noted to be pending further work-up to r/o DVT/PE this date. Will hold OT evaluation at this time and initiate services once pt cleared medically for exertional activity. Thank you.  Shara Blazing, M.S., OTR/L Ascom: [phone] 01/20/20, 12:04 PM

## 2020-01-20 NOTE — Progress Notes (Signed)
PROGRESS NOTE    Kayla Wu  MBE:[id number] DOB: 12/09/1945 DOA: 01/19/2020 PCP: Idelle Crouch, MD   Chief Complaint  Patient presents with  . Shortness of Breath    Spo2 82% on RA  Brief Narrative: 74 year old female with history of COPD, hypertension, ovarian cancer status post chemotherapy, GERD who was seen in the ED originally on 12/15 for shortness of breath nausea vomiting and was discharged home returns to the ED 12/16 with worsening shortness of breath with associated cough and hypoxia. She was seen in the ED COVID-19 negative chest x-ray showed bibasilar opacities, lab work with negative troponin negative BNP procalcitonin less than 0.1 no leukocytosis, positive D-dimer. She has placed on IV steroid and was admitted.  Subjective: Seen this morning she is alert awake oriented, husband at the bedside.  She was able to cough the oxygen for physical therapy. Denies any wheezing chest pain shortness of breath. Denies any cough or leg swelling.   Assessment & Plan:  Shortness of breath with the setting of COPD and with acute hypoxic respiratory failure: Admitted for COPD exacerbation,no obvious pneumonia or leukocytosis or fever.  No chest pain normal BNP/troponin and procalcitonin, but D-dimer was elevated in the ED 1083.  Seen this morning not in distress._No wheezing per admission note but has significantly improved. patient reports she had allergy with IV dye causing hives but no respiratory symptoms.  Low suspicion of DVT PE but given positive D-dimer and her symptoms it would be prudent to check VQ scan and duplex of the leg.  COPD with acute exacerbation: Continue oral prednisone, azithromycin as ordered with bronchodilators, supplemental oxygen as tolerated.  Benign essential HTN: BP is controlled on amlodipine, Cozaar and propanolol.  Morbid Obesity with BMI 35.  Will benefit with weight loss healthy lifestyle  Diabetes mellitus stable hemoglobin A1c 6.8.  Sugar  poorly controlled likely from steroid.  Continue sliding scale, Levemir, resume glimepiride. Hold metformin for now. Recent Labs  Lab 01/19/20 1927 01/20/20 0817 01/20/20 1235  GLUCAP 153* 226* 277*   Chronic urticaria  History of ovarian cancer  Nutrition: Diet Order            Diet Carb Modified Fluid consistency: Thin; Room service appropriate? Yes  Diet effective now                 There is no height or weight on file to calculate BMI.  DVT prophylaxis: Lovenox Code Status:   Code Status: DNR  Family Communication: plan of care discussed with patient and her husband at bedside.  Status is: Inpatient  Remains inpatient appropriate because:Ongoing diagnostic testing needed not appropriate for outpatient work up and Inpatient level of care appropriate due to severity of illness   Dispo: The patient is from: Home              Anticipated d/c is to: Home              Anticipated d/c date is: 1 day              Patient currently is not medically stable to d/c.  Consultants:see note  Procedures:see note  Culture/Microbiology No results found for: SDES, SPECREQUEST, CULT, REPTSTATUS  Other culture-see note  Medications: Scheduled Meds: . amLODipine  5 mg Oral Daily  . aspirin EC  81 mg Oral QPM  . atorvastatin  40 mg Oral QPM  . azithromycin  250 mg Oral Daily  . Chlorhexidine Gluconate Cloth  6 each Topical  Daily  . Chlorhexidine Gluconate Cloth  6 each Topical Daily  . enoxaparin (LOVENOX) injection  0.5 mg/kg Subcutaneous Q24H  . glimepiride  2 mg Oral BID  . insulin aspart  0-20 Units Subcutaneous TID WC  . insulin detemir  10 Units Subcutaneous QHS  . ipratropium-albuterol  3 mL Nebulization Q6H  . losartan  50 mg Oral Daily  . magnesium oxide  400 mg Oral BID  . mouth rinse  15 mL Mouth Rinse BID  . [START ON 01/21/2020] pneumococcal 23 valent vaccine  0.5 mL Intramuscular Tomorrow-1000  . [START ON 01/21/2020] predniSONE  40 mg Oral Q breakfast  .  propranolol  10 mg Oral BID  . sodium chloride flush  3 mL Intravenous Q12H  . venlafaxine XR  150 mg Oral Daily  . venlafaxine XR  37.5 mg Oral Daily  . vitamin B-12  1,000 mcg Oral Daily   Continuous Infusions: . sodium chloride      Antimicrobials: Anti-infectives (From admission, onward)   Start     Dose/Rate Route Frequency Ordered Stop   01/20/20 1000  azithromycin (ZITHROMAX) tablet 250 mg       Note to Pharmacy: Take 2 tablets (500 mg) on  Day 1,  followed by 1 tablet (250 mg) once daily on Days 2 through 5.     250 mg Oral Daily 01/19/20 2112     01/19/20 1830  cefTRIAXone (ROCEPHIN) 1 g in sodium chloride 0.9 % 100 mL IVPB        1 g 200 mL/hr over 30 Minutes Intravenous  Once 01/19/20 1827 01/19/20 1936   01/19/20 1830  azithromycin (ZITHROMAX) 500 mg in sodium chloride 0.9 % 250 mL IVPB        500 mg 250 mL/hr over 60 Minutes Intravenous  Once 01/19/20 1827 01/19/20 2047     Objective: Vitals: Today's Vitals   01/20/20 0736 01/20/20 0816 01/20/20 1125 01/20/20 1305  BP:  (!) 141/58 (!) 141/78   Pulse:  91 92   Resp:  18 18   Temp:  98.2 F (36.8 C) 98.3 F (36.8 C)   TempSrc:  Oral Oral   SpO2: 91% 94% 94% 91%  PainSc:        Intake/Output Summary (Last 24 hours) at 01/20/2020 1338 Last data filed at 01/20/2020 0957 Gross per 24 hour  Intake 0 ml  Output --  Net 0 ml   There were no vitals filed for this visit. Weight change:   Intake/Output from previous day: No intake/output data recorded. Intake/Output this shift: No intake/output data recorded.  Examination: General exam: AAOx3,NAD, weak appearing. HEENT:Oral mucosa moist, Ear/Nose WNL grossly,dentition normal. Respiratory system: bilaterally air entry present no wheezing or crackles, no use of accessory muscle, non tender. Cardiovascular system: S1 & S2 +, regular, No JVD. Gastrointestinal system: Abdomen soft, NT,ND, BS+. Nervous System:Alert, awake, moving extremities and grossly  nonfocal Extremities: No edema, distal peripheral pulses palpable.  Skin: No rashes,no icterus. MSK: Normal muscle bulk,tone, power  Data Reviewed: I have personally reviewed following labs and imaging studies CBC: Recent Labs  Lab 01/18/20 1052 01/19/20 1451 01/20/20 0446  WBC 16.9*  16.9* 10.9* 8.3  NEUTROABS 13.0*  --   --   HGB 13.6  13.5 12.0 11.9*  HCT 40.0  39.4 35.7* 35.1*  MCV 96.9  95.2 97.3 97.5  PLT 409*  383 323 413   Basic Metabolic Panel: Recent Labs  Lab 01/18/20 1052 01/19/20 1451 01/20/20 0446  NA 138  139 137  K 4.3 3.9 4.7  CL 98 101 100  CO2 25 28 26   GLUCOSE 208* 218* 267*  BUN 25* 33* 38*  CREATININE 0.89 0.91 0.89  CALCIUM 9.2 8.7* 8.7*  MG  --  2.0  --    GFR: Estimated Creatinine Clearance: 58.4 mL/min (by C-G formula based on SCr of 0.89 mg/dL). Liver Function Tests: Recent Labs  Lab 01/18/20 1052  AST 34  ALT 23  ALKPHOS 99  BILITOT 0.8  PROT 7.8  ALBUMIN 4.1   Recent Labs  Lab 01/18/20 1052  LIPASE 25   No results for input(s): AMMONIA in the last 168 hours. Coagulation Profile: No results for input(s): INR, PROTIME in the last 168 hours. Cardiac Enzymes: No results for input(s): CKTOTAL, CKMB, CKMBINDEX, TROPONINI in the last 168 hours. BNP (last 3 results) No results for input(s): PROBNP in the last 8760 hours. HbA1C: Recent Labs    01/19/20 1826  HGBA1C 6.8*   CBG: Recent Labs  Lab 01/19/20 1927 01/20/20 0817 01/20/20 1235  GLUCAP 153* 226* 277*   Lipid Profile: No results for input(s): CHOL, HDL, LDLCALC, TRIG, CHOLHDL, LDLDIRECT in the last 72 hours. Thyroid Function Tests: No results for input(s): TSH, T4TOTAL, FREET4, T3FREE, THYROIDAB in the last 72 hours. Anemia Panel: No results for input(s): VITAMINB12, FOLATE, FERRITIN, TIBC, IRON, RETICCTPCT in the last 72 hours. Sepsis Labs: Recent Labs  Lab 01/19/20 1451  PROCALCITON <0.10    Recent Results (from the past 240 hour(s))  Resp Panel by  RT-PCR (Flu A&B, Covid) Nasopharyngeal Swab     Status: None   Collection Time: 01/18/20  3:48 PM   Specimen: Nasopharyngeal Swab; Nasopharyngeal(NP) swabs in vial transport medium  Result Value Ref Range Status   SARS Coronavirus 2 by RT PCR NEGATIVE NEGATIVE Final    Comment: (NOTE) SARS-CoV-2 target nucleic acids are NOT DETECTED.  The SARS-CoV-2 RNA is generally detectable in upper respiratory specimens during the acute phase of infection. The lowest concentration of SARS-CoV-2 viral copies this assay can detect is 138 copies/mL. A negative result does not preclude SARS-Cov-2 infection and should not be used as the sole basis for treatment or other patient management decisions. A negative result may occur with  improper specimen collection/handling, submission of specimen other than nasopharyngeal swab, presence of viral mutation(s) within the areas targeted by this assay, and inadequate number of viral copies(<138 copies/mL). A negative result must be combined with clinical observations, patient history, and epidemiological information. The expected result is Negative.  Fact Sheet for Patients:  EntrepreneurPulse.com.au  Fact Sheet for Healthcare Providers:  IncredibleEmployment.be  This test is no t yet approved or cleared by the [place] FDA and  has been authorized for detection and/or diagnosis of SARS-CoV-2 by FDA under an Emergency Use Authorization (EUA). This EUA will remain  in effect (meaning this test can be used) for the duration of the COVID-19 declaration under Section 564(b)(1) of the Act, 21 U.S.C.section 360bbb-3(b)(1), unless the authorization is terminated  or revoked sooner.       Influenza A by PCR NEGATIVE NEGATIVE Final   Influenza B by PCR NEGATIVE NEGATIVE Final    Comment: (NOTE) The Xpert Xpress SARS-CoV-2/FLU/RSV plus assay is intended as an aid in the diagnosis of influenza from Nasopharyngeal swab  specimens and should not be used as a sole basis for treatment. Nasal washings and aspirates are unacceptable for Xpert Xpress SARS-CoV-2/FLU/RSV testing.  Fact Sheet for Patients: EntrepreneurPulse.com.au  Fact Sheet for Healthcare  Providers: IncredibleEmployment.be  This test is not yet approved or cleared by the [place] and has been authorized for detection and/or diagnosis of SARS-CoV-2 by FDA under an Emergency Use Authorization (EUA). This EUA will remain in effect (meaning this test can be used) for the duration of the COVID-19 declaration under Section 564(b)(1) of the Act, 21 U.S.C. section 360bbb-3(b)(1), unless the authorization is terminated or revoked.  Performed at [facility], [address] [place], [place] [address]   Resp Panel by RT-PCR (Flu A&B, Covid) Nasopharyngeal Swab     Status: None   Collection Time: 01/19/20  6:26 PM   Specimen: Nasopharyngeal Swab; Nasopharyngeal(NP) swabs in vial transport medium  Result Value Ref Range Status   SARS Coronavirus 2 by RT PCR NEGATIVE NEGATIVE Final    Comment: (NOTE) SARS-CoV-2 target nucleic acids are NOT DETECTED.  The SARS-CoV-2 RNA is generally detectable in upper respiratory specimens during the acute phase of infection. The lowest concentration of SARS-CoV-2 viral copies this assay can detect is 138 copies/mL. A negative result does not preclude SARS-Cov-2 infection and should not be used as the sole basis for treatment or other patient management decisions. A negative result may occur with  improper specimen collection/handling, submission of specimen other than nasopharyngeal swab, presence of viral mutation(s) within the areas targeted by this assay, and inadequate number of viral copies(<138 copies/mL). A negative result must be combined with clinical observations, patient history, and epidemiological information. The expected result is  Negative.  Fact Sheet for Patients:  EntrepreneurPulse.com.au  Fact Sheet for Healthcare Providers:  IncredibleEmployment.be  This test is no t yet approved or cleared by the [place] FDA and  has been authorized for detection and/or diagnosis of SARS-CoV-2 by FDA under an Emergency Use Authorization (EUA). This EUA will remain  in effect (meaning this test can be used) for the duration of the COVID-19 declaration under Section 564(b)(1) of the Act, 21 U.S.C.section 360bbb-3(b)(1), unless the authorization is terminated  or revoked sooner.       Influenza A by PCR NEGATIVE NEGATIVE Final   Influenza B by PCR NEGATIVE NEGATIVE Final    Comment: (NOTE) The Xpert Xpress SARS-CoV-2/FLU/RSV plus assay is intended as an aid in the diagnosis of influenza from Nasopharyngeal swab specimens and should not be used as a sole basis for treatment. Nasal washings and aspirates are unacceptable for Xpert Xpress SARS-CoV-2/FLU/RSV testing.  Fact Sheet for Patients: EntrepreneurPulse.com.au  Fact Sheet for Healthcare Providers: IncredibleEmployment.be  This test is not yet approved or cleared by the [place] FDA and has been authorized for detection and/or diagnosis of SARS-CoV-2 by FDA under an Emergency Use Authorization (EUA). This EUA will remain in effect (meaning this test can be used) for the duration of the COVID-19 declaration under Section 564(b)(1) of the Act, 21 U.S.C. section 360bbb-3(b)(1), unless the authorization is terminated or revoked.  Performed at [facility], [address] [place], [place] [address]      Radiology Studies: CT ABDOMEN PELVIS WO CONTRAST  Result Date: 01/18/2020 CLINICAL DATA:  Nausea, vomiting, abdominal pain EXAM: CT ABDOMEN AND PELVIS WITHOUT CONTRAST TECHNIQUE: Multidetector CT imaging of the abdomen and pelvis was performed following the standard  protocol without IV contrast. COMPARISON:  12/07/2010 FINDINGS: Lower chest: Scarring in the lung bases.  Small hiatal hernia. Hepatobiliary: No focal liver abnormality is seen. Status post cholecystectomy. No biliary dilatation. Pancreas: No focal abnormality or ductal dilatation. Spleen: No focal abnormality.  Normal size. Adrenals/Urinary  Tract: Punctate nonobstructing stone in the midpole of the right kidney. No hydronephrosis. No renal or adrenal mass. Urinary bladder unremarkable. Stomach/Bowel: Postoperative changes in the rectosigmoid colon. Scattered colonic diverticulosis. No active diverticulitis. Postoperative changes in mid small bowel. No evidence of bowel obstruction. Vascular/Lymphatic: Aortic atherosclerosis. No evidence of aneurysm or adenopathy. Reproductive: Prior hysterectomy.  No adnexal masses. Other: No free fluid or free air. Musculoskeletal: No acute bony abnormality. IMPRESSION: No acute findings in the abdomen or pelvis. Aortic atherosclerosis. Punctate right midpole nephrolithiasis. Electronically Signed   By: Rolm Baptise M.D.   On: 01/18/2020 18:55   US Venous Img Lower Bilateral (DVT)  Result Date: 01/20/2020 CLINICAL DATA:  74 year old female with a history of positive D-dimer EXAM: BILATERAL LOWER EXTREMITY VENOUS DOPPLER ULTRASOUND TECHNIQUE: Gray-scale sonography with graded compression, as well as color Doppler and duplex ultrasound were performed to evaluate the lower extremity deep venous systems from the level of the common femoral vein and including the common femoral, femoral, profunda femoral, popliteal and calf veins including the posterior tibial, peroneal and gastrocnemius veins when visible. The superficial great saphenous vein was also interrogated. Spectral Doppler was utilized to evaluate flow at rest and with distal augmentation maneuvers in the common femoral, femoral and popliteal veins. COMPARISON:  None. FINDINGS: RIGHT LOWER EXTREMITY Common Femoral Vein:  No evidence of thrombus. Normal compressibility, respiratory phasicity and response to augmentation. Saphenofemoral Junction: No evidence of thrombus. Normal compressibility and flow on color Doppler imaging. Profunda Femoral Vein: No evidence of thrombus. Normal compressibility and flow on color Doppler imaging. Femoral Vein: No evidence of thrombus. Normal compressibility, respiratory phasicity and response to augmentation. Popliteal Vein: No evidence of thrombus. Normal compressibility, respiratory phasicity and response to augmentation. Calf Veins: No evidence of thrombus. Normal compressibility and flow on color Doppler imaging. Superficial Great Saphenous Vein: No evidence of thrombus. Normal compressibility and flow on color Doppler imaging. Other Findings:  None. LEFT LOWER EXTREMITY Common Femoral Vein: No evidence of thrombus. Normal compressibility, respiratory phasicity and response to augmentation. Saphenofemoral Junction: No evidence of thrombus. Normal compressibility and flow on color Doppler imaging. Profunda Femoral Vein: No evidence of thrombus. Normal compressibility and flow on color Doppler imaging. Femoral Vein: No evidence of thrombus. Normal compressibility, respiratory phasicity and response to augmentation. Popliteal Vein: No evidence of thrombus. Normal compressibility, respiratory phasicity and response to augmentation. Calf Veins: No evidence of thrombus. Normal compressibility and flow on color Doppler imaging. Superficial Great Saphenous Vein: No evidence of thrombus. Normal compressibility and flow on color Doppler imaging. Other Findings:  None. IMPRESSION: Sonographic survey of the bilateral lower extremities negative for DVT Electronically Signed   By: Corrie Mckusick D.O.   On: 01/20/2020 12:40   DG Chest Portable 1 View  Result Date: 01/18/2020 CLINICAL DATA:  Shortness of breath, low O2 saturation decreased to 80% on room air. COPD, hypertension. EXAM: PORTABLE CHEST 1 VIEW  COMPARISON:  Chest x-ray 12/07/2010 FINDINGS: Right chest wall Port-A-Cath with tip overlying the expected region of the distal superior vena cava. The heart size and mediastinal contours are unchanged. Interval development of right base patchy interstitial and airspace opacity. Slight interstitial prominence at the left base. No pulmonary edema. No pleural effusion. No pneumothorax. No acute osseous abnormality. Punctate metallic densities overlying the right upper abdomen. IMPRESSION: Bibasilar, right greater than left, opacities could represent infection/inflammation. Electronically Signed   By: Iven Finn M.D.   On: 01/18/2020 16:00     LOS: 1 day   Antonieta Pert,  MD [facility]  01/20/2020, 1:38 PM

## 2020-01-20 NOTE — Progress Notes (Signed)
Initial Nutrition Assessment  DOCUMENTATION CODES:   Obesity unspecified  INTERVENTION:  Provide Ensure Max Protein po once daily, each supplement provides 150 kcal and 30 grams of protein.  NUTRITION DIAGNOSIS:   Increased nutrient needs related to catabolic illness (COPD) as evidenced by estimated needs.  GOAL:   Patient will meet greater than or equal to 90% of their needs  MONITOR:   PO intake,Supplement acceptance,Labs,Weight trends,I & O's  REASON FOR ASSESSMENT:   Consult Assessment of nutrition requirement/status  ASSESSMENT:   74 year old female with PMHx of COPD, GERD, HTN, ovarian cancer s/p chemo, DM admitted with acute exacerbation of COPD.   Met with patient at bedside. She reports she has a good appetite and eats well at meals. She reports at home she eats 3 meals per day and eats adequate protein. She also drinks a Boost that has 27 grams of protein once daily. She is eating 100% of her meals here. Patient is amenable to trying Ensure max protein.  Patient reports her UBW is 208 lbs and that she is weight-stable. RD obtained bed scale weight of 100.3 kg (221.12 lbs). Will continue to monitor weight trends.  Medications reviewed and include: azithromycin, Novolog 0-20 units TID, Levemir 12 units QHS, magnesium oxide 400 mg BID, Solu-Medrol 40 mg Q12hrs IV, vitamin B12 1000 micrograms daily.  Labs reviewed: CBG 207-356, BUN 38.  Patient does not meet criteria for malnutrition at this time.   NUTRITION - FOCUSED PHYSICAL EXAM: Flowsheet Row Most Recent Value  Orbital Region No depletion  Upper Arm Region No depletion  Thoracic and Lumbar Region No depletion  Buccal Region No depletion  Temple Region No depletion  Clavicle Bone Region No depletion  Clavicle and Acromion Bone Region No depletion  Scapular Bone Region No depletion  Dorsal Hand No depletion  Patellar Region No depletion  Anterior Thigh Region No depletion  Posterior Calf Region No  depletion  Edema (RD Assessment) None  Hair Reviewed  Eyes Reviewed  Mouth Reviewed  Skin Reviewed  Nails Reviewed      Diet Order:   Diet Order            Diet Carb Modified Fluid consistency: Thin; Room service appropriate? Yes  Diet effective now                EDUCATION NEEDS:   No education needs have been identified at this time  Skin:  Skin Assessment: Reviewed RN Assessment  Last BM:  01/19/2020 per chart  Height:   Ht Readings from Last 1 Encounters:  01/20/20 5' 3"  (1.6 m)   Weight:   Wt Readings from Last 1 Encounters:  01/20/20 100.3 kg   BMI:  Body mass index is 39.17 kg/m.  Estimated Nutritional Needs:   Kcal:  1900-2100  Protein:  95-105 grams  Fluid:  1.9-2.1 L/day  Jacklynn Barnacle, MS, RD, LDN Pager number available on Amion

## 2020-01-21 LAB — BASIC METABOLIC PANEL
Anion gap: 10 (ref 5–15)
BUN: 31 mg/dL — ABNORMAL HIGH (ref 8–23)
CO2: 29 mmol/L (ref 22–32)
Calcium: 8.9 mg/dL (ref 8.9–10.3)
Chloride: 101 mmol/L (ref 98–111)
Creatinine, Ser: 0.68 mg/dL (ref 0.44–1.00)
GFR, Estimated: >60 mL/min (ref >60)
Glucose, Bld: 123 mg/dL — ABNORMAL HIGH (ref 70–99)
Potassium: 4.1 mmol/L (ref 3.5–5.1)
Sodium: 140 mmol/L (ref 135–145)

## 2020-01-21 LAB — GLUCOSE, CAPILLARY
Glucose-Capillary: 139 mg/dL — ABNORMAL HIGH (ref 70–99)
Glucose-Capillary: 101 mg/dL — ABNORMAL HIGH (ref 70–99)

## 2020-01-21 MED ORDER — IPRATROPIUM-ALBUTEROL 0.5-2.5 (3) MG/3ML IN SOLN: 3.0000 mL | Freq: Two times a day (BID) | RESPIRATORY_TRACT | Status: DC

## 2020-01-21 MED ORDER — AZITHROMYCIN 250 MG PO TABS: 250.0000 mg | ORAL_TABLET | Freq: Every day | ORAL | 0 refills | Status: AC

## 2020-01-21 MED ORDER — ENOXAPARIN SODIUM 60 MG/0.6ML ~~LOC~~ SOLN: 0.5000 mg/kg | SUBCUTANEOUS | Status: DC

## 2020-01-21 MED ORDER — PREDNISONE 20 MG PO TABS: 20.0000 mg | ORAL_TABLET | Freq: Every day | ORAL | 0 refills | Status: AC

## 2020-01-21 NOTE — Progress Notes (Signed)
Patient ready for discharge. AVS given to patient and husband. No questions comments or concerns. VSS.

## 2020-01-21 NOTE — Progress Notes (Signed)
PHARMACIST - PHYSICIAN COMMUNICATION  CONCERNING:  Enoxaparin (Lovenox) for DVT Prophylaxis    RECOMMENDATION:  Filed Weights   01/20/20 1339 01/20/20 2123  Weight: 90.7 kg (199 lb 15.3 oz) 100.3 kg (221 lb 1.9 oz)    Body mass index is 39.17 kg/m.  Estimated Creatinine Clearance: 68.7 mL/min (by C-G formula based on SCr of 0.68 mg/dL).  WEIGHT HAS BEEN UPDATED IN EPIC  Based on [facility] policy patient is candidate for enoxaparin 0.5mg /kg TBW SQ every 24 hours based on BMI being >30.  Patient is candidate for enoxaparin 30mg  every 24 hours based on CrCl <67ml/min or Weight <45kg  DESCRIPTION: Pharmacy has adjusted enoxaparin dose per [facility] policy.  Patient is now receiving enoxaparin 50 mg every 24 hours    Pernell Dupre, PharmD, BCPS Clinical Pharmacist 01/21/2020 10:24 AM

## 2020-01-21 NOTE — Discharge Instructions (Signed)
Acute Respiratory Failure, Adult  Acute respiratory failure occurs when there is not enough oxygen passing from your lungs to your body. When this happens, your lungs have trouble removing carbon dioxide from the blood. This causes your blood oxygen level to drop too low as carbon dioxide builds up. Acute respiratory failure is a medical emergency. It can develop quickly, but it is temporary if treated promptly. Your lung capacity, or how much air your lungs can hold, may improve with time, exercise, and treatment. What are the causes? There are many possible causes of acute respiratory failure, including:  Lung injury.  Chest injury or damage to the ribs or tissues near the lungs.  Lung conditions that affect the flow of air and blood into and out of the lungs, such as pneumonia, acute respiratory distress syndrome, and cystic fibrosis.  Medical conditions, such as strokes or spinal cord injuries, that affect the muscles and nerves that control breathing.  Blood infection (sepsis).  Inflammation of the pancreas (pancreatitis).  A blood clot in the lungs (pulmonary embolism).  A large-volume blood transfusion.  Burns.  Near-drowning.  Seizure.  Smoke inhalation.  Reaction to medicines.  Alcohol or drug overdose. What increases the risk? This condition is more likely to develop in people who have:  A blocked airway.  Asthma.  A condition or disease that damages or weakens the muscles, nerves, bones, or tissues that are involved in breathing.  A serious infection.  A health problem that blocks the unconscious reflex that is involved in breathing, such as hypothyroidism or sleep apnea.  A lung injury or trauma. What are the signs or symptoms? Trouble breathing is the main symptom of acute respiratory failure. Symptoms may also include:  Rapid breathing.  Restlessness or anxiety.  Skin, lips, or fingernails that appear blue (cyanosis).  Rapid heart  rate.  Abnormal heart rhythms (arrhythmias).  Confusion or changes in behavior.  Tiredness or loss of energy.  Feeling sleepy or having a loss of consciousness. How is this diagnosed? Your health care provider can diagnose acute respiratory failure with a medical history and physical exam. During the exam, your health care provider will listen to your heart and check for crackling or wheezing sounds in your lungs. Your may also have tests to confirm the diagnosis and determine what is causing respiratory failure. These tests may include:  Measuring the amount of oxygen in your blood (pulse oximetry). The measurement comes from a small device that is placed on your finger, earlobe, or toe.  Other blood tests to measure blood gases and to look for signs of infection.  Sampling your cerebral spinal fluid or tracheal fluid to check for infections.  Chest X-ray to look for fluid in spaces that should be filled with air.  Electrocardiogram (ECG) to look at the heart's electrical activity. How is this treated? Treatment for this condition usually takes places in a hospital intensive care unit (ICU). Treatment depends on what is causing the condition. It may include one or more treatments until your symptoms improve. Treatment may include:  Supplemental oxygen. Extra oxygen is given through a tube in the nose, a face mask, or a hood.  A device such as a continuous positive airway pressure (CPAP) or bi-level positive airway pressure (BiPAP or BPAP) machine. This treatment uses mild air pressure to keep the airways open. A mask or other device will be placed over your nose or mouth. A tube that is connected to a motor will deliver oxygen through the   mask.  Ventilator. This treatment helps move air into and out of the lungs. This may be done with a bag and mask or a machine. For this treatment, a tube is placed in your windpipe (trachea) so air and oxygen can flow to the lungs.  Extracorporeal  membrane oxygenation (ECMO). This treatment temporarily takes over the function of the heart and lungs, supplying oxygen and removing carbon dioxide. ECMO gives the lungs a chance to recover. It may be used if a ventilator is not effective.  Tracheostomy. This is a procedure that creates a hole in the neck to insert a breathing tube.  Receiving fluids and medicines.  Rocking the bed to help breathing. Follow these instructions at home:  Take over-the-counter and prescription medicines only as told by your health care provider.  Return to normal activities as told by your health care provider. Ask your health care provider what activities are safe for you.  Keep all follow-up visits as told by your health care provider. This is important. How is this prevented? Treating infections and medical conditions that may lead to acute respiratory failure can help prevent the condition from developing. Contact a health care provider if:  You have a fever.  Your symptoms do not improve or they get worse. Get help right away if:  You are having trouble breathing.  You lose consciousness.  Your have cyanosis or turn blue.  You develop a rapid heart rate.  You are confused. These symptoms may represent a serious problem that is an emergency. Do not wait to see if the symptoms will go away. Get medical help right away. Call your local emergency services (911 in the [place] Do not drive yourself to the hospital. This information is not intended to replace advice given to you by your health care provider. Make sure you discuss any questions you have with your health care provider. Document Revised: 01/02/2017 Document Reviewed: 08/08/2015 Elsevier Patient Education  [employer].   COPD and Physical Activity Chronic obstructive pulmonary disease (COPD) is a long-term (chronic) condition that affects the lungs. COPD is a general term that can be used to describe many different lung problems  that cause lung swelling (inflammation) and limit airflow, including chronic bronchitis and emphysema. The main symptom of COPD is shortness of breath, which makes it harder to do even simple tasks. This can also make it harder to exercise and be active. Talk with your health care provider about treatments to help you breathe better and actions you can take to prevent breathing problems during physical activity. What are the benefits of exercising with COPD? Exercising regularly is an important part of a healthy lifestyle. You can still exercise and do physical activities even though you have COPD. Exercise and physical activity improve your shortness of breath by increasing blood flow (circulation). This causes your heart to pump more oxygen through your body. Moderate exercise can improve your:  Oxygen use.  Energy level.  Shortness of breath.  Strength in your breathing muscles.  Heart health.  Sleep.  Self-esteem and feelings of self-worth.  Depression, stress, and anxiety levels. Exercise can benefit everyone with COPD. The severity of your disease may affect how hard you can exercise, especially at first, but everyone can benefit. Talk with your health care provider about how much exercise is safe for you, and which activities and exercises are safe for you. What actions can I take to prevent breathing problems during physical activity?  Sign up for a pulmonary  rehabilitation program. This type of program may include: ? Education about lung diseases. ? Exercise classes that teach you how to exercise and be more active while improving your breathing. This usually involves:  Exercise using your lower extremities, such as a stationary bicycle.  About 30 minutes of exercise, 2 to 5 times per week, for 6 to 12 weeks  Strength training, such as push ups or leg lifts. ? Nutrition education. ? Group classes in which you can talk with others who also have COPD and learn ways to manage  stress.  If you use an oxygen tank, you should use it while you exercise. Work with your health care provider to adjust your oxygen for your physical activity. Your resting flow rate is different from your flow rate during physical activity.  While you are exercising: ? Take slow breaths. ? Pace yourself and do not try to go too fast. ? Purse your lips while breathing out. Pursing your lips is similar to a kissing or whistling position. ? If doing exercise that uses a quick burst of effort, such as weight lifting:  Breathe in before starting the exercise.  Breathe out during the hardest part of the exercise (such as raising the weights). Where to find support You can find support for exercising with COPD from:  Your health care provider.  A pulmonary rehabilitation program.  Your local health department or community health programs.  Support groups, online or in-person. Your health care provider may be able to recommend support groups. Where to find more information You can find more information about exercising with COPD from:  American Lung Association: [employer].  COPD Foundation: https://www.rivera.net/. Contact a health care provider if:  Your symptoms get worse.  You have chest pain.  You have nausea.  You have a fever.  You have trouble talking or catching your breath.  You want to start a new exercise program or a new activity. Summary  COPD is a general term that can be used to describe many different lung problems that cause lung swelling (inflammation) and limit airflow. This includes chronic bronchitis and emphysema.  Exercise and physical activity improve your shortness of breath by increasing blood flow (circulation). This causes your heart to provide more oxygen to your body.  Contact your health care provider before starting any exercise program or new activity. Ask your health care provider what exercises and activities are safe for you. This information is  not intended to replace advice given to you by your health care provider. Make sure you discuss any questions you have with your health care provider. Document Revised: 05/12/2018 Document Reviewed: 02/12/2017 Elsevier Patient Education  2020 [employer].

## 2020-01-21 NOTE — Discharge Summary (Signed)
Physician Discharge Summary  ARLYN BUMPUS WSF:[id number] DOB: 09/28/1945 DOA: 01/19/2020  PCP: Idelle Crouch, MD  Admit date: 01/19/2020 Discharge date: 01/21/2020  Admitted From: home Disposition:  home  Recommendations for Outpatient Follow-up:  1. Follow up with PCP in 1-2 weeks 2. Please obtain BMP/CBC in one week 3. Please follow up on the following pending results:  Home Health:no  Equipment/Devices: none  Discharge Condition: Stable Code Status:   Code Status: DNR Diet recommendation:  Diet Order            Diet Carb Modified           Diet Carb Modified Fluid consistency: Thin; Room service appropriate? Yes  Diet effective now                  Brief/Interim Summary:  74 year old female with history of COPD, hypertension, ovarian cancer status post chemotherapy, GERD who was seen in the ED originally on 12/15 for shortness of breath nausea vomiting and was discharged home returns to the ED 12/16 with worsening shortness of breath with associated cough and hypoxia. She was seen in the ED COVID-19 negative chest x-ray showed bibasilar opacities, lab work with negative troponin negative BNP procalcitonin less than 0.1 no leukocytosis, positive D-dimer. She has placed on IV steroid and was admitted. Patient was admitted and treated with Nerenberg dilators, supplemental oxygen and steroid.  She has improved now she is able to come with oxygen.  Underwent further work-up VQ scan negative duplex negative.  She feels well and feels ready for home today.  She reports she has not felt this much better in many days now.  Discharge Diagnoses:  Shortness of breath with the setting of COPD and with acute hypoxic respiratory failure: Admitted for COPD exacerbation,no obvious pneumonia or leukocytosis or fever.  No chest pain normal BNP/troponin and procalcitonin, but D-dimer was elevated in the ED 1083.  So underwent perfusion scan no evidence of PE.  Duplex was negative for  DVT.  She is able to cough the oxygen no respiratory issues shortness of breath chest pain.  She is able to ambulate without any issues.  She will be discharged home on steroid short course along with azithromycin.  COPD with acute exacerbation:  Resolved.  No more wheezing.  Finish short course of prednisone.    Benign essential HTN: BP is controlled on amlodipine, Cozaar and propanolol.  Morbid Obesity with BMI 35.  Will benefit with weight loss healthy lifestyle  Diabetes mellitus stable hemoglobin A1c 6.8.    Continue on her home regimen.  Monitor closely while on steroid. Recent Labs  Lab 01/20/20 1235 01/20/20 1640 01/20/20 2107 01/21/20 0946 01/21/20 1144  GLUCAP 277* 356* 207* 139* 101*   Chronic urticaria  History of ovarian cancer Consults: none Subjective: Alert awake oriented.  Doing well oxygen being weaned off.  Husband at the bedside.  Feels ready for home today.  Discharge Exam: Vitals:   01/21/20 0939 01/21/20 1312  BP: 134/60 (!) 104/53  Pulse: 98 75  Resp: 19   Temp: 97.6 F (36.4 C) 98.7 F (37.1 C)  SpO2: 94% 94%   General: Pt is alert, awake, not in acute distress Cardiovascular: RRR, S1/S2 +, no rubs, no gallops Respiratory: CTA bilaterally, no wheezing, no rhonchi Abdominal: Soft, NT, ND, bowel sounds + Extremities: no edema, no cyanosis  Discharge Instructions  Discharge Instructions    Diet Carb Modified   Complete by: As directed    Discharge instructions  Complete by: As directed    Please call call MD or return to ER for similar or worsening recurring problem that brought you to hospital or if any fever,nausea/vomiting,abdominal pain, uncontrolled pain, chest pain,  shortness of breath or any other alarming symptoms.  Please follow-up your doctor as instructed in a week time and call the office for appointment.  Check blood sugar 3 times a day and bedtime at home. If blood sugar running above 200 less than 70 please call your  MD to adjust insulin. If blood sugars running less 100 do not use insulin and call MD. If you noticed signs and symptoms of hypoglycemia or low blood sugar like jitteriness, confusion, thirst, tremor, sweating- Check blood sugar, drink sugary drink/biscuits/sweets to increase sugar level and call MD or return to ER.  Please avoid alcohol, smoking, or any other illicit substance and maintain healthy habits including taking your regular medications as prescribed.  You were cared for by a hospitalist during your hospital stay. If you have any questions about your discharge medications or the care you received while you were in the hospital after you are discharged, you can call the unit and ask to speak with the hospitalist on call if the hospitalist that took care of you is not available.  Once you are discharged, your primary care physician will handle any further medical issues. Please note that NO REFILLS for any discharge medications will be authorized once you are discharged, as it is imperative that you return to your primary care physician (or establish a relationship with a primary care physician if you do not have one) for your aftercare needs so that they can reassess your need for medications and monitor your lab values   .   Increase activity slowly   Complete by: As directed      Allergies as of 01/21/2020      Reactions   Iodinated Diagnostic Agents Hives, Shortness Of Breath, Itching   Patient states she had itching, hives, and SOB after injection. She states she was given Benadryl before and after injection and still had reaction.      Medication List    TAKE these medications   amLODipine 5 MG tablet Commonly known as: NORVASC Take 5 mg by mouth daily.   aspirin 81 MG tablet Take 81 mg by mouth every evening.   atorvastatin 40 MG tablet Commonly known as: LIPITOR Take 40 mg by mouth every evening.   azithromycin 250 MG tablet Commonly known as: Zithromax Z-Pak Take  1 tablet (250 mg total) by mouth daily for 3 doses. What changed:   how much to take  how to take this  when to take this  additional instructions   cephALEXin 500 MG capsule Commonly known as: KEFLEX Take 1 capsule (500 mg total) by mouth 3 (three) times daily for 10 days.   glimepiride 2 MG tablet Commonly known as: AMARYL Take 2 mg by mouth 2 (two) times daily.   LORazepam 1 MG tablet Commonly known as: ATIVAN Take 1 mg by mouth 2 (two) times daily as needed for anxiety or sleep.   losartan 50 MG tablet Commonly known as: COZAAR Take 50 mg by mouth daily.   magnesium oxide 400 MG tablet Commonly known as: MAG-OX Take 400 mg by mouth 2 (two) times daily.   metFORMIN 500 MG 24 hr tablet Commonly known as: GLUCOPHAGE-XR Take 1,500 mg by mouth daily with supper.   predniSONE 20 MG tablet Commonly known as: DELTASONE  Take 1 tablet (20 mg total) by mouth daily with breakfast for 3 days. Start taking on: January 22, 2020   propranolol 10 MG tablet Commonly known as: INDERAL Take 10 mg by mouth 2 (two) times daily.   venlafaxine XR 150 MG 24 hr capsule Commonly known as: EFFEXOR-XR Take 150 mg by mouth daily. (take with 37.5mg  capsule to equal 187.5mg  total)   venlafaxine XR 37.5 MG 24 hr capsule Commonly known as: EFFEXOR-XR Take 37.5 mg by mouth daily. (take with 150mg  capsule to equal 187.5mg  total)   vitamin B-12 1000 MCG tablet Commonly known as: CYANOCOBALAMIN Take 1,000 mcg by mouth daily.   zolpidem 10 MG tablet Commonly known as: AMBIEN Take 10 mg by mouth at bedtime as needed for sleep.       Follow-up Information    Idelle Crouch, MD Follow up in 7 day(s).   Specialty: Internal Medicine Why: Please follow up in 5-7 days of discharge Contact information: [place] [phone]              Allergies  Allergen Reactions  . Iodinated Diagnostic Agents Hives, Shortness Of Breath and Itching     Patient states she had itching, hives, and SOB after injection. She states she was given Benadryl before and after injection and still had reaction.    The results of significant diagnostics from this hospitalization (including imaging, microbiology, ancillary and laboratory) are listed below for reference.    Microbiology: Recent Results (from the past 240 hour(s))  Resp Panel by RT-PCR (Flu A&B, Covid) Nasopharyngeal Swab     Status: None   Collection Time: 01/18/20  3:48 PM   Specimen: Nasopharyngeal Swab; Nasopharyngeal(NP) swabs in vial transport medium  Result Value Ref Range Status   SARS Coronavirus 2 by RT PCR NEGATIVE NEGATIVE Final    Comment: (NOTE) SARS-CoV-2 target nucleic acids are NOT DETECTED.  The SARS-CoV-2 RNA is generally detectable in upper respiratory specimens during the acute phase of infection. The lowest concentration of SARS-CoV-2 viral copies this assay can detect is 138 copies/mL. A negative result does not preclude SARS-Cov-2 infection and should not be used as the sole basis for treatment or other patient management decisions. A negative result may occur with  improper specimen collection/handling, submission of specimen other than nasopharyngeal swab, presence of viral mutation(s) within the areas targeted by this assay, and inadequate number of viral copies(<138 copies/mL). A negative result must be combined with clinical observations, patient history, and epidemiological information. The expected result is Negative.  Fact Sheet for Patients:  EntrepreneurPulse.com.au  Fact Sheet for Healthcare Providers:  IncredibleEmployment.be  This test is no t yet approved or cleared by the [place] FDA and  has been authorized for detection and/or diagnosis of SARS-CoV-2 by FDA under an Emergency Use Authorization (EUA). This EUA will remain  in effect (meaning this test can be used) for the duration of the COVID-19  declaration under Section 564(b)(1) of the Act, 21 U.S.C.section 360bbb-3(b)(1), unless the authorization is terminated  or revoked sooner.       Influenza A by PCR NEGATIVE NEGATIVE Final   Influenza B by PCR NEGATIVE NEGATIVE Final    Comment: (NOTE) The Xpert Xpress SARS-CoV-2/FLU/RSV plus assay is intended as an aid in the diagnosis of influenza from Nasopharyngeal swab specimens and should not be used as a sole basis for treatment. Nasal washings and aspirates are unacceptable for Xpert Xpress SARS-CoV-2/FLU/RSV testing.  Fact Sheet for Patients: EntrepreneurPulse.com.au  Fact Sheet for Healthcare Providers: IncredibleEmployment.be  This test is not yet approved or cleared by the [place] FDA and has been authorized for detection and/or diagnosis of SARS-CoV-2 by FDA under an Emergency Use Authorization (EUA). This EUA will remain in effect (meaning this test can be used) for the duration of the COVID-19 declaration under Section 564(b)(1) of the Act, 21 U.S.C. section 360bbb-3(b)(1), unless the authorization is terminated or revoked.  Performed at [facility], [address] [place], [place] [address]   Resp Panel by RT-PCR (Flu A&B, Covid) Nasopharyngeal Swab     Status: None   Collection Time: 01/19/20  6:26 PM   Specimen: Nasopharyngeal Swab; Nasopharyngeal(NP) swabs in vial transport medium  Result Value Ref Range Status   SARS Coronavirus 2 by RT PCR NEGATIVE NEGATIVE Final    Comment: (NOTE) SARS-CoV-2 target nucleic acids are NOT DETECTED.  The SARS-CoV-2 RNA is generally detectable in upper respiratory specimens during the acute phase of infection. The lowest concentration of SARS-CoV-2 viral copies this assay can detect is 138 copies/mL. A negative result does not preclude SARS-Cov-2 infection and should not be used as the sole basis for treatment or other patient management decisions. A negative result  may occur with  improper specimen collection/handling, submission of specimen other than nasopharyngeal swab, presence of viral mutation(s) within the areas targeted by this assay, and inadequate number of viral copies(<138 copies/mL). A negative result must be combined with clinical observations, patient history, and epidemiological information. The expected result is Negative.  Fact Sheet for Patients:  EntrepreneurPulse.com.au  Fact Sheet for Healthcare Providers:  IncredibleEmployment.be  This test is no t yet approved or cleared by the [place] FDA and  has been authorized for detection and/or diagnosis of SARS-CoV-2 by FDA under an Emergency Use Authorization (EUA). This EUA will remain  in effect (meaning this test can be used) for the duration of the COVID-19 declaration under Section 564(b)(1) of the Act, 21 U.S.C.section 360bbb-3(b)(1), unless the authorization is terminated  or revoked sooner.       Influenza A by PCR NEGATIVE NEGATIVE Final   Influenza B by PCR NEGATIVE NEGATIVE Final    Comment: (NOTE) The Xpert Xpress SARS-CoV-2/FLU/RSV plus assay is intended as an aid in the diagnosis of influenza from Nasopharyngeal swab specimens and should not be used as a sole basis for treatment. Nasal washings and aspirates are unacceptable for Xpert Xpress SARS-CoV-2/FLU/RSV testing.  Fact Sheet for Patients: EntrepreneurPulse.com.au  Fact Sheet for Healthcare Providers: IncredibleEmployment.be  This test is not yet approved or cleared by the [place] FDA and has been authorized for detection and/or diagnosis of SARS-CoV-2 by FDA under an Emergency Use Authorization (EUA). This EUA will remain in effect (meaning this test can be used) for the duration of the COVID-19 declaration under Section 564(b)(1) of the Act, 21 U.S.C. section 360bbb-3(b)(1), unless the authorization is terminated  or revoked.  Performed at [facility], [address] [place], [place] [address]     Procedures/Studies: CT ABDOMEN PELVIS WO CONTRAST  Result Date: 01/18/2020 CLINICAL DATA:  Nausea, vomiting, abdominal pain EXAM: CT ABDOMEN AND PELVIS WITHOUT CONTRAST TECHNIQUE: Multidetector CT imaging of the abdomen and pelvis was performed following the standard protocol without IV contrast. COMPARISON:  12/07/2010 FINDINGS: Lower chest: Scarring in the lung bases.  Small hiatal hernia. Hepatobiliary: No focal liver abnormality is seen. Status post cholecystectomy. No biliary dilatation. Pancreas: No focal abnormality or ductal dilatation. Spleen: No focal abnormality.  Normal  size. Adrenals/Urinary Tract: Punctate nonobstructing stone in the midpole of the right kidney. No hydronephrosis. No renal or adrenal mass. Urinary bladder unremarkable. Stomach/Bowel: Postoperative changes in the rectosigmoid colon. Scattered colonic diverticulosis. No active diverticulitis. Postoperative changes in mid small bowel. No evidence of bowel obstruction. Vascular/Lymphatic: Aortic atherosclerosis. No evidence of aneurysm or adenopathy. Reproductive: Prior hysterectomy.  No adnexal masses. Other: No free fluid or free air. Musculoskeletal: No acute bony abnormality. IMPRESSION: No acute findings in the abdomen or pelvis. Aortic atherosclerosis. Punctate right midpole nephrolithiasis. Electronically Signed   By: Rolm Baptise M.D.   On: 01/18/2020 18:55   NM Pulmonary Perfusion  Result Date: 01/20/2020 CLINICAL DATA:  74 year old with respiratory failure. EXAM: NUCLEAR MEDICINE PERFUSION LUNG SCAN TECHNIQUE: Perfusion images were obtained in multiple projections after intravenous injection of radiopharmaceutical. Ventilation scans intentionally deferred if perfusion scan and chest x-ray adequate for interpretation during COVID 19 epidemic. RADIOPHARMACEUTICALS:  4.44 mCi Tc-76m MAA IV COMPARISON:  Chest radiograph  01/20/2020 FINDINGS: No focal perfusion abnormality. Specifically, no significant wedge-shaped or peripheral perfusion abnormality. IMPRESSION: Normal perfusion scan.  No evidence to suggest pulmonary embolism. Electronically Signed   By: Markus Daft M.D.   On: 01/20/2020 16:26   US Venous Img Lower Bilateral (DVT)  Result Date: 01/20/2020 CLINICAL DATA:  73 year old female with a history of positive D-dimer EXAM: BILATERAL LOWER EXTREMITY VENOUS DOPPLER ULTRASOUND TECHNIQUE: Gray-scale sonography with graded compression, as well as color Doppler and duplex ultrasound were performed to evaluate the lower extremity deep venous systems from the level of the common femoral vein and including the common femoral, femoral, profunda femoral, popliteal and calf veins including the posterior tibial, peroneal and gastrocnemius veins when visible. The superficial great saphenous vein was also interrogated. Spectral Doppler was utilized to evaluate flow at rest and with distal augmentation maneuvers in the common femoral, femoral and popliteal veins. COMPARISON:  None. FINDINGS: RIGHT LOWER EXTREMITY Common Femoral Vein: No evidence of thrombus. Normal compressibility, respiratory phasicity and response to augmentation. Saphenofemoral Junction: No evidence of thrombus. Normal compressibility and flow on color Doppler imaging. Profunda Femoral Vein: No evidence of thrombus. Normal compressibility and flow on color Doppler imaging. Femoral Vein: No evidence of thrombus. Normal compressibility, respiratory phasicity and response to augmentation. Popliteal Vein: No evidence of thrombus. Normal compressibility, respiratory phasicity and response to augmentation. Calf Veins: No evidence of thrombus. Normal compressibility and flow on color Doppler imaging. Superficial Great Saphenous Vein: No evidence of thrombus. Normal compressibility and flow on color Doppler imaging. Other Findings:  None. LEFT LOWER EXTREMITY Common Femoral  Vein: No evidence of thrombus. Normal compressibility, respiratory phasicity and response to augmentation. Saphenofemoral Junction: No evidence of thrombus. Normal compressibility and flow on color Doppler imaging. Profunda Femoral Vein: No evidence of thrombus. Normal compressibility and flow on color Doppler imaging. Femoral Vein: No evidence of thrombus. Normal compressibility, respiratory phasicity and response to augmentation. Popliteal Vein: No evidence of thrombus. Normal compressibility, respiratory phasicity and response to augmentation. Calf Veins: No evidence of thrombus. Normal compressibility and flow on color Doppler imaging. Superficial Great Saphenous Vein: No evidence of thrombus. Normal compressibility and flow on color Doppler imaging. Other Findings:  None. IMPRESSION: Sonographic survey of the bilateral lower extremities negative for DVT Electronically Signed   By: Corrie Mckusick D.O.   On: 01/20/2020 12:40   DG Chest Port 1 View  Result Date: 01/20/2020 CLINICAL DATA:  Shortness of breath, cough EXAM: PORTABLE CHEST 1 VIEW COMPARISON:  01/18/2020 FINDINGS: Right-sided Port-A-Cath is unchanged  in positioning. The heart size and mediastinal contours are stable. Atherosclerotic calcification of the aortic knob. Chronically coarsened interstitial markings. Improved aeration of the lung bases compared to previous study. IMPRESSION: Improved aeration of the lung bases compared to previous study. Electronically Signed   By: Davina Poke D.O.   On: 01/20/2020 16:22   DG Chest Portable 1 View  Result Date: 01/18/2020 CLINICAL DATA:  Shortness of breath, low O2 saturation decreased to 80% on room air. COPD, hypertension. EXAM: PORTABLE CHEST 1 VIEW COMPARISON:  Chest x-ray 12/07/2010 FINDINGS: Right chest wall Port-A-Cath with tip overlying the expected region of the distal superior vena cava. The heart size and mediastinal contours are unchanged. Interval development of right base patchy  interstitial and airspace opacity. Slight interstitial prominence at the left base. No pulmonary edema. No pleural effusion. No pneumothorax. No acute osseous abnormality. Punctate metallic densities overlying the right upper abdomen. IMPRESSION: Bibasilar, right greater than left, opacities could represent infection/inflammation. Electronically Signed   By: Iven Finn M.D.   On: 01/18/2020 16:00    Labs: BNP (last 3 results) Recent Labs    01/19/20 1451  BNP 62.3   Basic Metabolic Panel: Recent Labs  Lab 01/18/20 1052 01/19/20 1451 01/20/20 0446 01/21/20 0506  NA 138 139 137 140  K 4.3 3.9 4.7 4.1  CL 98 101 100 101  CO2 25 28 26 29   GLUCOSE 208* 218* 267* 123*  BUN 25* 33* 38* 31*  CREATININE 0.89 0.91 0.89 0.68  CALCIUM 9.2 8.7* 8.7* 8.9  MG  --  2.0  --   --    Liver Function Tests: Recent Labs  Lab 01/18/20 1052  AST 34  ALT 23  ALKPHOS 99  BILITOT 0.8  PROT 7.8  ALBUMIN 4.1   Recent Labs  Lab 01/18/20 1052  LIPASE 25   No results for input(s): AMMONIA in the last 168 hours. CBC: Recent Labs  Lab 01/18/20 1052 01/19/20 1451 01/20/20 0446  WBC 16.9*  16.9* 10.9* 8.3  NEUTROABS 13.0*  --   --   HGB 13.6  13.5 12.0 11.9*  HCT 40.0  39.4 35.7* 35.1*  MCV 96.9  95.2 97.3 97.5  PLT 409*  383 323 291   Cardiac Enzymes: No results for input(s): CKTOTAL, CKMB, CKMBINDEX, TROPONINI in the last 168 hours. BNP: Invalid input(s): POCBNP CBG: Recent Labs  Lab 01/20/20 1235 01/20/20 1640 01/20/20 2107 01/21/20 0946 01/21/20 1144  GLUCAP 277* 356* 207* 139* 101*   D-Dimer No results for input(s): DDIMER in the last 72 hours. Hgb A1c Recent Labs    01/19/20 1826  HGBA1C 6.8*   Lipid Profile No results for input(s): CHOL, HDL, LDLCALC, TRIG, CHOLHDL, LDLDIRECT in the last 72 hours. Thyroid function studies No results for input(s): TSH, T4TOTAL, T3FREE, THYROIDAB in the last 72 hours.  Invalid input(s): FREET3 Anemia work up No results  for input(s): VITAMINB12, FOLATE, FERRITIN, TIBC, IRON, RETICCTPCT in the last 72 hours. Urinalysis    Component Value Date/Time   COLORURINE AMBER (A) 01/18/2020 1052   APPEARANCEUR CLOUDY (A) 01/18/2020 1052   LABSPEC 1.027 01/18/2020 1052   PHURINE 5.0 01/18/2020 1052   GLUCOSEU NEGATIVE 01/18/2020 1052   HGBUR NEGATIVE 01/18/2020 1052   BILIRUBINUR SMALL (A) 01/18/2020 1052   KETONESUR 5 (A) 01/18/2020 1052   PROTEINUR >=300 (A) 01/18/2020 1052   NITRITE NEGATIVE 01/18/2020 1052   LEUKOCYTESUR SMALL (A) 01/18/2020 1052   Sepsis Labs Invalid input(s): PROCALCITONIN,  WBC,  LACTICIDVEN Microbiology Recent  Results (from the past 240 hour(s))  Resp Panel by RT-PCR (Flu A&B, Covid) Nasopharyngeal Swab     Status: None   Collection Time: 01/18/20  3:48 PM   Specimen: Nasopharyngeal Swab; Nasopharyngeal(NP) swabs in vial transport medium  Result Value Ref Range Status   SARS Coronavirus 2 by RT PCR NEGATIVE NEGATIVE Final    Comment: (NOTE) SARS-CoV-2 target nucleic acids are NOT DETECTED.  The SARS-CoV-2 RNA is generally detectable in upper respiratory specimens during the acute phase of infection. The lowest concentration of SARS-CoV-2 viral copies this assay can detect is 138 copies/mL. A negative result does not preclude SARS-Cov-2 infection and should not be used as the sole basis for treatment or other patient management decisions. A negative result may occur with  improper specimen collection/handling, submission of specimen other than nasopharyngeal swab, presence of viral mutation(s) within the areas targeted by this assay, and inadequate number of viral copies(<138 copies/mL). A negative result must be combined with clinical observations, patient history, and epidemiological information. The expected result is Negative.  Fact Sheet for Patients:  EntrepreneurPulse.com.au  Fact Sheet for Healthcare Providers:   IncredibleEmployment.be  This test is no t yet approved or cleared by the [place] FDA and  has been authorized for detection and/or diagnosis of SARS-CoV-2 by FDA under an Emergency Use Authorization (EUA). This EUA will remain  in effect (meaning this test can be used) for the duration of the COVID-19 declaration under Section 564(b)(1) of the Act, 21 U.S.C.section 360bbb-3(b)(1), unless the authorization is terminated  or revoked sooner.       Influenza A by PCR NEGATIVE NEGATIVE Final   Influenza B by PCR NEGATIVE NEGATIVE Final    Comment: (NOTE) The Xpert Xpress SARS-CoV-2/FLU/RSV plus assay is intended as an aid in the diagnosis of influenza from Nasopharyngeal swab specimens and should not be used as a sole basis for treatment. Nasal washings and aspirates are unacceptable for Xpert Xpress SARS-CoV-2/FLU/RSV testing.  Fact Sheet for Patients: EntrepreneurPulse.com.au  Fact Sheet for Healthcare Providers: IncredibleEmployment.be  This test is not yet approved or cleared by the [place] FDA and has been authorized for detection and/or diagnosis of SARS-CoV-2 by FDA under an Emergency Use Authorization (EUA). This EUA will remain in effect (meaning this test can be used) for the duration of the COVID-19 declaration under Section 564(b)(1) of the Act, 21 U.S.C. section 360bbb-3(b)(1), unless the authorization is terminated or revoked.  Performed at [facility], [address] [place], [place] [address]   Resp Panel by RT-PCR (Flu A&B, Covid) Nasopharyngeal Swab     Status: None   Collection Time: 01/19/20  6:26 PM   Specimen: Nasopharyngeal Swab; Nasopharyngeal(NP) swabs in vial transport medium  Result Value Ref Range Status   SARS Coronavirus 2 by RT PCR NEGATIVE NEGATIVE Final    Comment: (NOTE) SARS-CoV-2 target nucleic acids are NOT DETECTED.  The SARS-CoV-2 RNA is generally detectable  in upper respiratory specimens during the acute phase of infection. The lowest concentration of SARS-CoV-2 viral copies this assay can detect is 138 copies/mL. A negative result does not preclude SARS-Cov-2 infection and should not be used as the sole basis for treatment or other patient management decisions. A negative result may occur with  improper specimen collection/handling, submission of specimen other than nasopharyngeal swab, presence of viral mutation(s) within the areas targeted by this assay, and inadequate number of viral copies(<138 copies/mL). A negative result must be combined with clinical observations, patient history, and epidemiological information.  The expected result is Negative.  Fact Sheet for Patients:  EntrepreneurPulse.com.au  Fact Sheet for Healthcare Providers:  IncredibleEmployment.be  This test is no t yet approved or cleared by the [place] FDA and  has been authorized for detection and/or diagnosis of SARS-CoV-2 by FDA under an Emergency Use Authorization (EUA). This EUA will remain  in effect (meaning this test can be used) for the duration of the COVID-19 declaration under Section 564(b)(1) of the Act, 21 U.S.C.section 360bbb-3(b)(1), unless the authorization is terminated  or revoked sooner.       Influenza A by PCR NEGATIVE NEGATIVE Final   Influenza B by PCR NEGATIVE NEGATIVE Final    Comment: (NOTE) The Xpert Xpress SARS-CoV-2/FLU/RSV plus assay is intended as an aid in the diagnosis of influenza from Nasopharyngeal swab specimens and should not be used as a sole basis for treatment. Nasal washings and aspirates are unacceptable for Xpert Xpress SARS-CoV-2/FLU/RSV testing.  Fact Sheet for Patients: EntrepreneurPulse.com.au  Fact Sheet for Healthcare Providers: IncredibleEmployment.be  This test is not yet approved or cleared by the [place] FDA and has  been authorized for detection and/or diagnosis of SARS-CoV-2 by FDA under an Emergency Use Authorization (EUA). This EUA will remain in effect (meaning this test can be used) for the duration of the COVID-19 declaration under Section 564(b)(1) of the Act, 21 U.S.C. section 360bbb-3(b)(1), unless the authorization is terminated or revoked.  Performed at [facility], [address] [place], [place] [address]      Time coordinating discharge: 25 minutes  SIGNED: Antonieta Pert, MD  [facility] 01/21/2020, 1:19 PM  If 7PM-7AM, please contact night-coverage [email or web address]

## 2020-01-21 NOTE — Plan of Care (Signed)

## 2020-01-21 NOTE — Progress Notes (Signed)
OT Cancellation Note  Patient Details Name: Kayla Wu MRN: [id number] DOB: 02/16/1945   Cancelled Treatment:    Reason Eval/Treat Not Completed: OT screened, no needs identified, will sign off. Per conversation c PT and pt/spouse in room, report pt at functional baseline. Denies skilled acute OT needs, will sign off. Please re-consult if new needs arise. Thank you.  Dessie Coma, M.S. OTR/L  01/21/20, 10:30 AM  ascom [phone]

## 2020-01-23 ENCOUNTER — Other Ambulatory Visit: Payer: Medicare HMO

## 2020-02-01 ENCOUNTER — Ambulatory Visit
Admission: RE | Admit: 2020-02-01 | Discharge: 2020-02-01 | Disposition: A | Discharge status: Home / Self Care | Payer: Medicare HMO | Source: Ambulatory Visit | Attending: Internal Medicine | Admitting: Internal Medicine

## 2020-02-01 ENCOUNTER — Other Ambulatory Visit: Payer: Self-pay

## 2020-02-01 DIAGNOSIS — Z1231 Encounter for screening mammogram for malignant neoplasm of breast: Secondary | ICD-10-CM | POA: Diagnosis not present

## 2020-02-14 DIAGNOSIS — Z1152 Encounter for screening for COVID-19: Secondary | ICD-10-CM | POA: Diagnosis not present

## 2020-03-19 DIAGNOSIS — Z79899 Other long term (current) drug therapy: Secondary | ICD-10-CM | POA: Diagnosis not present

## 2020-03-19 DIAGNOSIS — Z794 Long term (current) use of insulin: Secondary | ICD-10-CM | POA: Diagnosis not present

## 2020-03-19 DIAGNOSIS — E7800 Pure hypercholesterolemia, unspecified: Secondary | ICD-10-CM | POA: Diagnosis not present

## 2020-03-19 DIAGNOSIS — E119 Type 2 diabetes mellitus without complications: Secondary | ICD-10-CM | POA: Diagnosis not present

## 2020-03-19 DIAGNOSIS — I10 Essential (primary) hypertension: Secondary | ICD-10-CM | POA: Diagnosis not present

## 2020-03-26 DIAGNOSIS — I10 Essential (primary) hypertension: Secondary | ICD-10-CM | POA: Diagnosis not present

## 2020-03-26 DIAGNOSIS — E1159 Type 2 diabetes mellitus with other circulatory complications: Secondary | ICD-10-CM | POA: Diagnosis not present

## 2020-03-26 DIAGNOSIS — E1169 Type 2 diabetes mellitus with other specified complication: Secondary | ICD-10-CM | POA: Diagnosis not present

## 2020-03-26 DIAGNOSIS — D649 Anemia, unspecified: Secondary | ICD-10-CM | POA: Diagnosis not present

## 2020-03-26 DIAGNOSIS — I700 Atherosclerosis of aorta: Secondary | ICD-10-CM | POA: Diagnosis not present

## 2020-03-26 DIAGNOSIS — Z0000 Encounter for general adult medical examination without abnormal findings: Secondary | ICD-10-CM | POA: Diagnosis not present

## 2020-03-26 DIAGNOSIS — J449 Chronic obstructive pulmonary disease, unspecified: Secondary | ICD-10-CM | POA: Diagnosis not present

## 2020-03-26 DIAGNOSIS — E669 Obesity, unspecified: Secondary | ICD-10-CM | POA: Diagnosis not present

## 2020-03-26 DIAGNOSIS — E785 Hyperlipidemia, unspecified: Secondary | ICD-10-CM | POA: Diagnosis not present

## 2020-03-26 DIAGNOSIS — E119 Type 2 diabetes mellitus without complications: Secondary | ICD-10-CM | POA: Diagnosis not present

## 2020-03-26 DIAGNOSIS — Z794 Long term (current) use of insulin: Secondary | ICD-10-CM | POA: Diagnosis not present

## 2020-03-26 DIAGNOSIS — E118 Type 2 diabetes mellitus with unspecified complications: Secondary | ICD-10-CM | POA: Diagnosis not present

## 2020-04-19 DIAGNOSIS — H524 Presbyopia: Secondary | ICD-10-CM | POA: Diagnosis not present

## 2020-05-14 DIAGNOSIS — Z6839 Body mass index (BMI) 39.0-39.9, adult: Secondary | ICD-10-CM | POA: Diagnosis not present

## 2020-05-14 DIAGNOSIS — C569 Malignant neoplasm of unspecified ovary: Secondary | ICD-10-CM | POA: Diagnosis not present

## 2020-05-23 DIAGNOSIS — K760 Fatty (change of) liver, not elsewhere classified: Secondary | ICD-10-CM | POA: Diagnosis not present

## 2020-05-23 DIAGNOSIS — C562 Malignant neoplasm of left ovary: Secondary | ICD-10-CM | POA: Diagnosis not present

## 2020-06-03 DIAGNOSIS — I2699 Other pulmonary embolism without acute cor pulmonale: Secondary | ICD-10-CM

## 2020-06-03 DIAGNOSIS — I8290 Acute embolism and thrombosis of unspecified vein: Secondary | ICD-10-CM

## 2020-06-03 DIAGNOSIS — U071 COVID-19: Secondary | ICD-10-CM

## 2020-06-03 DIAGNOSIS — I480 Paroxysmal atrial fibrillation: Secondary | ICD-10-CM

## 2020-06-03 HISTORY — DX: Acute embolism and thrombosis of unspecified vein: I82.90

## 2020-06-03 HISTORY — DX: COVID-19: U07.1

## 2020-06-03 HISTORY — DX: Paroxysmal atrial fibrillation: I48.0

## 2020-06-03 HISTORY — DX: Other pulmonary embolism without acute cor pulmonale: I26.99

## 2020-06-25 ENCOUNTER — Other Ambulatory Visit: Payer: Self-pay

## 2020-06-25 ENCOUNTER — Encounter: Payer: Self-pay | Admitting: Emergency Medicine

## 2020-06-25 ENCOUNTER — Emergency Department: Payer: Medicare HMO

## 2020-06-25 ENCOUNTER — Inpatient Hospital Stay
Admission: EM | Admit: 2020-06-25 | Discharge: 2020-06-30 | DRG: 177 | Disposition: A | Discharge status: Home / Self Care | Payer: Medicare HMO | Attending: Internal Medicine | Admitting: Internal Medicine

## 2020-06-25 DIAGNOSIS — F419 Anxiety disorder, unspecified: Secondary | ICD-10-CM

## 2020-06-25 DIAGNOSIS — E785 Hyperlipidemia, unspecified: Secondary | ICD-10-CM | POA: Diagnosis present

## 2020-06-25 DIAGNOSIS — Z825 Family history of asthma and other chronic lower respiratory diseases: Secondary | ICD-10-CM

## 2020-06-25 DIAGNOSIS — Z8616 Personal history of COVID-19: Secondary | ICD-10-CM

## 2020-06-25 DIAGNOSIS — E119 Type 2 diabetes mellitus without complications: Secondary | ICD-10-CM | POA: Diagnosis not present

## 2020-06-25 DIAGNOSIS — F32A Depression, unspecified: Secondary | ICD-10-CM

## 2020-06-25 DIAGNOSIS — J1282 Pneumonia due to coronavirus disease 2019: Secondary | ICD-10-CM | POA: Diagnosis not present

## 2020-06-25 DIAGNOSIS — Z66 Do not resuscitate: Secondary | ICD-10-CM | POA: Diagnosis not present

## 2020-06-25 DIAGNOSIS — E871 Hypo-osmolality and hyponatremia: Secondary | ICD-10-CM | POA: Diagnosis present

## 2020-06-25 DIAGNOSIS — Z7984 Long term (current) use of oral hypoglycemic drugs: Secondary | ICD-10-CM

## 2020-06-25 DIAGNOSIS — E0865 Diabetes mellitus due to underlying condition with hyperglycemia: Secondary | ICD-10-CM | POA: Diagnosis not present

## 2020-06-25 DIAGNOSIS — Z6836 Body mass index (BMI) 36.0-36.9, adult: Secondary | ICD-10-CM | POA: Diagnosis not present

## 2020-06-25 DIAGNOSIS — E876 Hypokalemia: Secondary | ICD-10-CM | POA: Diagnosis present

## 2020-06-25 DIAGNOSIS — Z8543 Personal history of malignant neoplasm of ovary: Secondary | ICD-10-CM | POA: Diagnosis not present

## 2020-06-25 DIAGNOSIS — Z87891 Personal history of nicotine dependence: Secondary | ICD-10-CM

## 2020-06-25 DIAGNOSIS — E1165 Type 2 diabetes mellitus with hyperglycemia: Secondary | ICD-10-CM | POA: Diagnosis not present

## 2020-06-25 DIAGNOSIS — T380X5A Adverse effect of glucocorticoids and synthetic analogues, initial encounter: Secondary | ICD-10-CM | POA: Diagnosis present

## 2020-06-25 DIAGNOSIS — U071 COVID-19: Secondary | ICD-10-CM | POA: Diagnosis not present

## 2020-06-25 DIAGNOSIS — A419 Sepsis, unspecified organism: Secondary | ICD-10-CM | POA: Diagnosis not present

## 2020-06-25 DIAGNOSIS — Z9221 Personal history of antineoplastic chemotherapy: Secondary | ICD-10-CM | POA: Diagnosis not present

## 2020-06-25 DIAGNOSIS — E538 Deficiency of other specified B group vitamins: Secondary | ICD-10-CM | POA: Diagnosis not present

## 2020-06-25 DIAGNOSIS — K219 Gastro-esophageal reflux disease without esophagitis: Secondary | ICD-10-CM | POA: Diagnosis present

## 2020-06-25 DIAGNOSIS — J440 Chronic obstructive pulmonary disease with acute lower respiratory infection: Secondary | ICD-10-CM | POA: Diagnosis not present

## 2020-06-25 DIAGNOSIS — J432 Centrilobular emphysema: Secondary | ICD-10-CM | POA: Diagnosis not present

## 2020-06-25 DIAGNOSIS — Z9049 Acquired absence of other specified parts of digestive tract: Secondary | ICD-10-CM | POA: Diagnosis not present

## 2020-06-25 DIAGNOSIS — Z91041 Radiographic dye allergy status: Secondary | ICD-10-CM

## 2020-06-25 DIAGNOSIS — J9601 Acute respiratory failure with hypoxia: Secondary | ICD-10-CM | POA: Diagnosis present

## 2020-06-25 DIAGNOSIS — I10 Essential (primary) hypertension: Secondary | ICD-10-CM

## 2020-06-25 DIAGNOSIS — Z90710 Acquired absence of both cervix and uterus: Secondary | ICD-10-CM | POA: Diagnosis not present

## 2020-06-25 DIAGNOSIS — N390 Urinary tract infection, site not specified: Secondary | ICD-10-CM | POA: Diagnosis present

## 2020-06-25 DIAGNOSIS — Z7982 Long term (current) use of aspirin: Secondary | ICD-10-CM

## 2020-06-25 DIAGNOSIS — I480 Paroxysmal atrial fibrillation: Secondary | ICD-10-CM | POA: Diagnosis not present

## 2020-06-25 DIAGNOSIS — R21 Rash and other nonspecific skin eruption: Secondary | ICD-10-CM | POA: Diagnosis not present

## 2020-06-25 DIAGNOSIS — J9811 Atelectasis: Secondary | ICD-10-CM | POA: Diagnosis not present

## 2020-06-25 DIAGNOSIS — Z7189 Other specified counseling: Secondary | ICD-10-CM | POA: Diagnosis not present

## 2020-06-25 DIAGNOSIS — Z79899 Other long term (current) drug therapy: Secondary | ICD-10-CM

## 2020-06-25 DIAGNOSIS — R0602 Shortness of breath: Secondary | ICD-10-CM | POA: Diagnosis not present

## 2020-06-25 DIAGNOSIS — I4891 Unspecified atrial fibrillation: Secondary | ICD-10-CM | POA: Diagnosis not present

## 2020-06-25 HISTORY — DX: Personal history of COVID-19: Z86.16

## 2020-06-25 HISTORY — DX: Morbid (severe) obesity due to excess calories: E66.01

## 2020-06-25 HISTORY — DX: Personal history of other medical treatment: Z92.89

## 2020-06-25 LAB — CBC
HCT: 37.8 % (ref 36.0–46.0)
Hemoglobin: 12.5 g/dL (ref 12.0–15.0)
MCH: 31.7 pg (ref 26.0–34.0)
MCHC: 33.1 g/dL (ref 30.0–36.0)
MCV: 95.9 fL (ref 80.0–100.0)
Platelets: 287 10*3/uL (ref 150–400)
RBC: 3.94 MIL/uL (ref 3.87–5.11)
RDW: 12.3 % (ref 11.5–15.5)
WBC: 8.1 10*3/uL (ref 4.0–10.5)
nRBC: 0 % (ref 0.0–0.2)

## 2020-06-25 LAB — LACTATE DEHYDROGENASE: LDH: 165 U/L (ref 98–192)

## 2020-06-25 LAB — RESP PANEL BY RT-PCR (FLU A&B, COVID) ARPGX2
Influenza A by PCR: NEGATIVE
Influenza B by PCR: NEGATIVE
SARS Coronavirus 2 by RT PCR: POSITIVE — AB

## 2020-06-25 LAB — BASIC METABOLIC PANEL
Anion gap: 12 (ref 5–15)
BUN: 27 mg/dL — ABNORMAL HIGH (ref 8–23)
CO2: 26 mmol/L (ref 22–32)
Calcium: 8.6 mg/dL — ABNORMAL LOW (ref 8.9–10.3)
Chloride: 96 mmol/L — ABNORMAL LOW (ref 98–111)
Creatinine, Ser: 0.9 mg/dL (ref 0.44–1.00)
GFR, Estimated: >60 mL/min (ref >60)
Glucose, Bld: 178 mg/dL — ABNORMAL HIGH (ref 70–99)
Potassium: 4.2 mmol/L (ref 3.5–5.1)
Sodium: 134 mmol/L — ABNORMAL LOW (ref 135–145)

## 2020-06-25 LAB — LACTIC ACID, PLASMA: Lactic Acid, Venous: 2.3 mmol/L (ref 0.5–1.9)

## 2020-06-25 LAB — TRIGLYCERIDES: Triglycerides: 110 mg/dL (ref <150)

## 2020-06-25 LAB — FIBRINOGEN: Fibrinogen: 646 mg/dL — ABNORMAL HIGH (ref 210–475)

## 2020-06-25 LAB — PROCALCITONIN: Procalcitonin: <0.1 ng/mL

## 2020-06-25 LAB — FERRITIN: Ferritin: 93 ng/mL (ref 11–307)

## 2020-06-25 MED ORDER — LORAZEPAM 1 MG PO TABS
1.0000 mg | ORAL_TABLET | Freq: Two times a day (BID) | ORAL | Status: DC | PRN
  Administered 2020-06-27 – 2020-06-30 (×4): 1 mg via ORAL
  Filled 2020-06-25 (×4): qty 1

## 2020-06-25 MED ORDER — MAGNESIUM OXIDE -MG SUPPLEMENT 400 (240 MG) MG PO TABS
400.0000 mg | ORAL_TABLET | Freq: Two times a day (BID) | ORAL | Status: DC
  Administered 2020-06-26 – 2020-06-30 (×10): 400 mg via ORAL
  Filled 2020-06-25 (×10): qty 1

## 2020-06-25 MED ORDER — DM-GUAIFENESIN ER 30-600 MG PO TB12
1.0000 | ORAL_TABLET | Freq: Two times a day (BID) | ORAL | Status: DC | PRN
  Administered 2020-06-27 (×2): 1 via ORAL
  Filled 2020-06-25 (×2): qty 1

## 2020-06-25 MED ORDER — DEXAMETHASONE SODIUM PHOSPHATE 10 MG/ML IJ SOLN
6.0000 mg | Freq: Once | INTRAMUSCULAR | Status: AC
  Administered 2020-06-25: 6 mg via INTRAVENOUS
  Filled 2020-06-25: qty 1

## 2020-06-25 MED ORDER — GUAIFENESIN ER 600 MG PO TB12
600.0000 mg | ORAL_TABLET | Freq: Two times a day (BID) | ORAL | Status: DC
  Administered 2020-06-25 – 2020-06-30 (×10): 600 mg via ORAL
  Filled 2020-06-25 (×10): qty 1

## 2020-06-25 MED ORDER — ONDANSETRON HCL 4 MG PO TABS: 4.0000 mg | ORAL_TABLET | Freq: Four times a day (QID) | ORAL | Status: DC | PRN

## 2020-06-25 MED ORDER — HYDROCOD POLST-CPM POLST ER 10-8 MG/5ML PO SUER
5.0000 mL | Freq: Two times a day (BID) | ORAL | Status: DC | PRN
Start: 2020-06-25 — End: 2020-06-30

## 2020-06-25 MED ORDER — ATORVASTATIN CALCIUM 20 MG PO TABS
40.0000 mg | ORAL_TABLET | Freq: Every evening | ORAL | Status: DC
  Administered 2020-06-26 – 2020-06-29 (×5): 40 mg via ORAL
  Filled 2020-06-25 (×5): qty 2

## 2020-06-25 MED ORDER — ZINC SULFATE 220 (50 ZN) MG PO CAPS
220.0000 mg | ORAL_CAPSULE | Freq: Every day | ORAL | Status: DC
  Administered 2020-06-26 – 2020-06-30 (×5): 220 mg via ORAL
  Filled 2020-06-25 (×5): qty 1

## 2020-06-25 MED ORDER — ASCORBIC ACID 500 MG PO TABS
500.0000 mg | ORAL_TABLET | Freq: Every day | ORAL | Status: DC
  Administered 2020-06-26 – 2020-06-30 (×5): 500 mg via ORAL
  Filled 2020-06-25 (×5): qty 1

## 2020-06-25 MED ORDER — LOSARTAN POTASSIUM 50 MG PO TABS
50.0000 mg | ORAL_TABLET | Freq: Every day | ORAL | Status: DC
  Administered 2020-06-26 – 2020-06-30 (×5): 50 mg via ORAL
  Filled 2020-06-25 (×5): qty 1

## 2020-06-25 MED ORDER — ONDANSETRON HCL 4 MG/2ML IJ SOLN: 4.0000 mg | Freq: Four times a day (QID) | INTRAMUSCULAR | Status: DC | PRN

## 2020-06-25 MED ORDER — VITAMIN B-12 1000 MCG PO TABS
1000.0000 ug | ORAL_TABLET | Freq: Every day | ORAL | Status: DC
  Administered 2020-06-26 – 2020-06-30 (×5): 1000 ug via ORAL
  Filled 2020-06-25 (×5): qty 1

## 2020-06-25 MED ORDER — ENOXAPARIN SODIUM 40 MG/0.4ML IJ SOSY
40.0000 mg | PREFILLED_SYRINGE | INTRAMUSCULAR | Status: DC
  Administered 2020-06-26: 40 mg via SUBCUTANEOUS
  Filled 2020-06-25: qty 0.4

## 2020-06-25 MED ORDER — SODIUM CHLORIDE 0.9 % IV SOLN
200.0000 mg | Freq: Once | INTRAVENOUS | Status: AC
  Administered 2020-06-26: 200 mg via INTRAVENOUS
  Filled 2020-06-25: qty 200

## 2020-06-25 MED ORDER — GUAIFENESIN-DM 100-10 MG/5ML PO SYRP: 10.0000 mL | ORAL_SOLUTION | ORAL | Status: DC | PRN

## 2020-06-25 MED ORDER — MAGNESIUM HYDROXIDE 400 MG/5ML PO SUSP: 30.0000 mL | Freq: Every day | ORAL | Status: DC | PRN

## 2020-06-25 MED ORDER — GLIMEPIRIDE 2 MG PO TABS
2.0000 mg | ORAL_TABLET | Freq: Two times a day (BID) | ORAL | Status: DC
  Administered 2020-06-26 – 2020-06-30 (×10): 2 mg via ORAL
  Filled 2020-06-25 (×11): qty 1

## 2020-06-25 MED ORDER — PROPRANOLOL HCL 20 MG PO TABS
10.0000 mg | ORAL_TABLET | Freq: Two times a day (BID) | ORAL | Status: DC
  Administered 2020-06-26 – 2020-06-30 (×9): 10 mg via ORAL
  Filled 2020-06-25 (×10): qty 0.5

## 2020-06-25 MED ORDER — ASPIRIN 81 MG PO CHEW
81.0000 mg | CHEWABLE_TABLET | Freq: Every evening | ORAL | Status: DC
  Administered 2020-06-25 – 2020-06-28 (×4): 81 mg via ORAL
  Filled 2020-06-25 (×5): qty 1

## 2020-06-25 MED ORDER — AMLODIPINE BESYLATE 5 MG PO TABS
5.0000 mg | ORAL_TABLET | Freq: Every day | ORAL | Status: DC
  Administered 2020-06-26 – 2020-06-29 (×4): 5 mg via ORAL
  Filled 2020-06-25 (×4): qty 1

## 2020-06-25 MED ORDER — SODIUM CHLORIDE 0.9 % IV SOLN
100.0000 mg | Freq: Every day | INTRAVENOUS | Status: AC
  Administered 2020-06-26 – 2020-06-29 (×4): 100 mg via INTRAVENOUS
  Filled 2020-06-25: qty 20
  Filled 2020-06-25: qty 100
  Filled 2020-06-25: qty 20
  Filled 2020-06-25: qty 100
  Filled 2020-06-25: qty 20

## 2020-06-25 MED ORDER — VITAMIN D 25 MCG (1000 UNIT) PO TABS
1000.0000 [IU] | ORAL_TABLET | Freq: Every day | ORAL | Status: DC
  Administered 2020-06-26 – 2020-06-30 (×5): 1000 [IU] via ORAL
  Filled 2020-06-25 (×6): qty 1

## 2020-06-25 MED ORDER — PREDNISONE 20 MG PO TABS
50.0000 mg | ORAL_TABLET | Freq: Every day | ORAL | Status: DC
  Administered 2020-06-29 – 2020-06-30 (×2): 50 mg via ORAL
  Filled 2020-06-25 (×2): qty 3

## 2020-06-25 MED ORDER — VENLAFAXINE HCL ER 150 MG PO CP24
150.0000 mg | ORAL_CAPSULE | Freq: Every day | ORAL | Status: DC
  Administered 2020-06-26 – 2020-06-30 (×5): 150 mg via ORAL
  Filled 2020-06-25 (×3): qty 1
  Filled 2020-06-25: qty 2
  Filled 2020-06-25: qty 1
  Filled 2020-06-25 (×4): qty 2
  Filled 2020-06-25: qty 1

## 2020-06-25 MED ORDER — VENLAFAXINE HCL ER 37.5 MG PO CP24
37.5000 mg | ORAL_CAPSULE | Freq: Every day | ORAL | Status: DC
  Administered 2020-06-26 – 2020-06-30 (×5): 37.5 mg via ORAL
  Filled 2020-06-25 (×6): qty 1

## 2020-06-25 MED ORDER — ZOLPIDEM TARTRATE 5 MG PO TABS
5.0000 mg | ORAL_TABLET | Freq: Every evening | ORAL | Status: DC | PRN
  Administered 2020-06-26 – 2020-06-29 (×5): 5 mg via ORAL
  Filled 2020-06-25 (×5): qty 1

## 2020-06-25 MED ORDER — TRAZODONE HCL 50 MG PO TABS
25.0000 mg | ORAL_TABLET | Freq: Every evening | ORAL | Status: DC | PRN
  Filled 2020-06-25 (×2): qty 1

## 2020-06-25 MED ORDER — METHYLPREDNISOLONE SODIUM SUCC 125 MG IJ SOLR
0.5000 mg/kg | Freq: Two times a day (BID) | INTRAMUSCULAR | Status: AC
  Administered 2020-06-25 – 2020-06-28 (×6): 47.5 mg via INTRAVENOUS
  Filled 2020-06-25 (×6): qty 2

## 2020-06-25 MED ORDER — SODIUM CHLORIDE 0.9 % IV SOLN: INTRAVENOUS | Status: DC

## 2020-06-25 MED ORDER — FAMOTIDINE 20 MG PO TABS
20.0000 mg | ORAL_TABLET | Freq: Two times a day (BID) | ORAL | Status: DC
  Administered 2020-06-25 – 2020-06-30 (×10): 20 mg via ORAL
  Filled 2020-06-25 (×10): qty 1

## 2020-06-25 NOTE — H&P (Signed)
[facility]   PATIENT NAME: Kayla Wu    MR#:  [id number]  DATE OF BIRTH:  20-Jul-1945  DATE OF ADMISSION:  06/25/2020  PRIMARY CARE PHYSICIAN: Idelle Crouch, MD   Patient is coming from: Home  REQUESTING/REFERRING PHYSICIAN: Arta Silence, MD CHIEF COMPLAINT:   Chief Complaint  Patient presents with  . Shortness of Breath    HISTORY OF PRESENT ILLNESS:  MATISSE ROSKELLEY is a 75 y.o. Caucasian female with medical history significant for COPD, diabetes mellitus, hypertension, GERD and degenerative disc disease, who presented to the ER with acute onset of feeling sick over the last week with associated dyspnea and cough productive of yellowish sputum as well as wheezing and generalized weakness with body aches.  She admits to bad headaches without paresthesias or focal muscle weakness.  No nausea or vomiting or diarrhea.  She admitted to nasal and sinus congestion.  She had mildly diminished sense of taste and smell.  She had a home COVID test that was positive several days ago.  She also had a confirmatory PCR.  No dysuria, oliguria or hematuria or flank pain.  She denies any abdominal pain or bleeding diathesis.  She was seen [facility] clinic earlier today and was hypoxemic with a pulse ox into the 80s.  ED Course: Upon presentation to the emergency room blood pressure was 145/72 with respiratory to 22 and her pulse oximetry was 99% on 2 L O2 by nasal cannula.  It has been in the 80s at [facility] clinic earlier today.  Labs revealed mild hyponatremia and hypokalemia blood glucose of 178 and a BUN of 27.  Her LDH was 165 and ferritin 93 with lactic acid of 2.3 and procalcitonin less than 0.1.  CBC was unremarkable.  Fibrinogen was slightly elevated 646.  COVID-19 PCR came back positive and influenza antigens were negative.  Blood cultures were drawn.  EKG as reviewed by me : Showed normal sinus rhythm with a rate of 79 with T wave inversion laterally. Imaging: 2 view  chest x-ray showed no acute cardiopulmonary disease.  The patient was given 6 mg of IV Decadron.  She will be admitted to a medical monitored bed for further evaluation and management. PAST MEDICAL HISTORY:   Past Medical History:  Diagnosis Date  . Chronic urticaria   . COPD (chronic obstructive pulmonary disease) ([place])   . DDD (degenerative disc disease)   . Diabetes mellitus ([place])   . GERD (gastroesophageal reflux disease)   . Hypertension   . Menopausal state   . Non-cardiac chest pain   . Ovarian cancer ([place]) 2012   s/p chemo   . Personal history of chemotherapy 2012   ovarian  . Pneumonia 1987    PAST SURGICAL HISTORY:   Past Surgical History:  Procedure Laterality Date  . APPENDECTOMY    . CHOLECYSTECTOMY    . FINGER ARTHROPLASTY Left 12/22/2014   Procedure: FINGER ARTHROPLASTY;  Surgeon: [name], MD;  Location: [facility] ORS;  Service: Orthopedics;  Laterality: Left;  . hysterectomy      SOCIAL HISTORY:   Social History   Tobacco Use  . Smoking status: Former Smoker    Packs/day: 2.00    Years: 42.00    Pack years: 84.00    Types: Cigarettes    Quit date: 07/05/2007    Years since quitting: 12.9  . Smokeless tobacco: Never Used  Substance Use Topics  . Alcohol use: No    Comment: occasional glass of wine  once a month    FAMILY HISTORY:   Family History  Problem Relation Age of Onset  . Emphysema Father        smoked  . Breast cancer Mother 31  . Colon cancer Sister     DRUG ALLERGIES:   Allergies  Allergen Reactions  . Iodinated Diagnostic Agents Hives, Shortness Of Breath and Itching    Patient states she had itching, hives, and SOB after injection. She states she was given Benadryl before and after injection and still had reaction.    REVIEW OF SYSTEMS:   ROS As per history of present illness. All pertinent systems were reviewed above. Constitutional, HEENT, cardiovascular, respiratory, GI, GU, musculoskeletal, neuro, psychiatric,  endocrine, integumentary and hematologic systems were reviewed and are otherwise negative/unremarkable except for positive findings mentioned above in the HPI.   MEDICATIONS AT HOME:   Prior to Admission medications   Medication Sig Start Date End Date Taking? Authorizing Provider  albuterol (VENTOLIN HFA) 108 (90 Base) MCG/ACT inhaler Inhale 1-2 puffs into the lungs every 4 (four) hours as needed. 01/20/20  Yes [provider]  amLODipine (NORVASC) 5 MG tablet Take 5 mg by mouth daily. 01/18/20  Yes [provider]  aspirin 81 MG tablet Take 81 mg by mouth every evening.   Yes [provider]  atorvastatin (LIPITOR) 20 MG tablet Take 1 tablet by mouth daily. 04/29/20  Yes [provider]  celecoxib (CELEBREX) 200 MG capsule Take 200 mg by mouth daily. 05/08/20  Yes [provider]  glimepiride (AMARYL) 2 MG tablet Take 2 mg by mouth 2 (two) times daily.   Yes [provider]  LORazepam (ATIVAN) 1 MG tablet Take 1 mg by mouth 2 (two) times daily as needed for anxiety or sleep. 12/14/19  Yes [provider]  losartan (COZAAR) 100 MG tablet Take 1 tablet by mouth daily. 06/11/20  Yes [provider]  magnesium oxide (MAG-OX) 400 MG tablet Take 400 mg by mouth 2 (two) times daily.   Yes [provider]  metFORMIN (GLUCOPHAGE-XR) 500 MG 24 hr tablet Take 1,500 mg by mouth daily with supper.   Yes [provider]  omeprazole (PRILOSEC) 40 MG capsule Take 1 capsule by mouth 2 (two) times daily. 04/16/20  Yes [provider]  propranolol (INDERAL) 10 MG tablet Take 10 mg by mouth 2 (two) times daily. 01/16/20  Yes [provider]  venlafaxine XR (EFFEXOR-XR) 150 MG 24 hr capsule Take 150 mg by mouth daily. (take with 37.5mg  capsule to equal 187.5mg  total)   Yes [provider]  venlafaxine XR (EFFEXOR-XR) 37.5 MG 24 hr capsule Take 37.5 mg by mouth daily. (take with 150mg  capsule to equal  187.5mg  total) 01/09/20  Yes [provider]  vitamin B-12 (CYANOCOBALAMIN) 1000 MCG tablet Take 1,000 mcg by mouth daily.   Yes [provider]  zolpidem (AMBIEN) 10 MG tablet Take 10 mg by mouth at bedtime as needed for sleep.   Yes [provider]      VITAL SIGNS:  Blood pressure (!) 142/63, pulse 79, temperature 98.5 F (36.9 C), temperature source Oral, resp. rate 18, height 5' 3.5" (1.613 m), weight 95.3 kg, SpO2 95 %.  PHYSICAL EXAMINATION:  Physical Exam  GENERAL:  75 y.o.-year-old patient lying in the bed with no acute distress.  EYES: Pupils equal, round, reactive to light and accommodation. No scleral icterus. Extraocular muscles intact.  HEENT: Head atraumatic, normocephalic. Oropharynx and nasopharynx clear.  NECK:  Supple, no jugular venous distention. No thyroid enlargement, no tenderness.  LUNGS: Normal breath sounds bilaterally, no wheezing, rales,rhonchi or crepitation. No use of accessory muscles of respiration.  CARDIOVASCULAR: Regular rate and rhythm, S1, S2 normal. No murmurs, rubs, or gallops.  ABDOMEN: Soft, nondistended, nontender. Bowel sounds present. No organomegaly or mass.  EXTREMITIES: No pedal edema, cyanosis, or clubbing.  NEUROLOGIC: Cranial nerves II through XII are intact. Muscle strength 5/5 in all extremities. Sensation intact. Gait not checked.  PSYCHIATRIC: The patient is alert and oriented x 3.  Normal affect and good eye contact. SKIN: No obvious rash, lesion, or ulcer.   LABORATORY PANEL:   CBC Recent Labs  Lab 06/25/20 1619  WBC 8.1  HGB 12.5  HCT 37.8  PLT 287   ------------------------------------------------------------------------------------------------------------------  Chemistries  Recent Labs  Lab 06/25/20 1619  NA 134*  K 4.2  CL 96*  CO2 26  GLUCOSE 178*  BUN 27*  CREATININE 0.90  CALCIUM 8.6*    ------------------------------------------------------------------------------------------------------------------  Cardiac Enzymes No results for input(s): TROPONINI in the last 168 hours. ------------------------------------------------------------------------------------------------------------------  RADIOLOGY:  DG Chest 2 View  Result Date: 06/25/2020 CLINICAL DATA:  75 year old female with shortness of breath. EXAM: CHEST - 2 VIEW COMPARISON:  Chest radiograph dated 01/20/2020 FINDINGS: Right-sided Port-A-Cath in similar position. There are bibasilar linear atelectasis. No focal consolidation, pleural effusion, or pneumothorax. Stable cardiac silhouette. No acute osseous pathology. Degenerative changes of the spine. IMPRESSION: No active cardiopulmonary disease. Electronically Signed   By: Anner Crete M.D.   On: 06/25/2020 17:20      IMPRESSION AND PLAN:  Active Problems:   Acute hypoxemic respiratory failure due to COVID-19 ([place])  1.  Acute hypoxemic respiratory failure due to COVID-19. - The patient be admitted to a medical monitored bed. - We will place her on IV remdesivir. - We will continue IV steroid therapy with Solu-Medrol. - Mucolytic therapy will be provided. - We will follow inflammatory markers. - She will be placed on vitamin C, vitamin D3, zinc sulfate and aspirin. - We will hold off antibiotics as procalcitonin was less than 0.1.  2.  Essential hypertension. - We will continue Norvasc, Cozaar and Inderal.  3.  Type 2 diabetes mellitus. - The patient will be placed on supplemental coverage with NovoLog. - We will hold off metformin.  4.  Depression and anxiety. - We will continue Effexor XR and Ativan.  5.  Vitamin B12 deficiency. - We will continue vitamin B12.  6.  GERD. - We will continue PPI therapy.   DVT prophylaxis: Lovenox. Code Status: The patient is DNR/DNI. Family Communication:  The plan of care was discussed in details with the  patient (and family). I answered all questions. The patient agreed to proceed with the above mentioned plan. Further management will depend upon hospital course. Disposition Plan: Back to previous home environment Consults called: none. All the records are reviewed and case discussed with ED provider.  Status is: Inpatient  Remains inpatient appropriate because:Ongoing active pain requiring inpatient pain management, Ongoing diagnostic testing needed not appropriate for outpatient work up, Unsafe d/c plan, IV treatments appropriate due to intensity of illness or inability to take PO and Inpatient level of care appropriate due to severity of illness   Dispo: The patient is from: Home              Anticipated d/c is to: Home              Patient currently is not medically  stable to d/c.   Difficult to place patient No   TOTAL TIME TAKING CARE OF THIS PATIENT: 55 minutes.    Christel Mormon M.D on 06/25/2020 at 10:39 PM  [facility]   From 7 PM-7 AM, contact night-coverage [email or web address]  CC: Primary care physician; Idelle Crouch, MD

## 2020-06-25 NOTE — ED Provider Notes (Signed)
[facility] Emergency Department Provider Note ____________________________________________   Event Date/Time   First MD Initiated Contact with Patient 06/25/20 2017     (approximate)  I have reviewed the triage vital signs and the nursing notes.   HISTORY  Chief Complaint Shortness of Breath    HPI Kayla Wu is a 75 y.o. female with PMH as noted below including COPD (not on home O2) and diabetes who presents with increased shortness of breath over the last several days after being diagnosed with COVID.  The patient states that she initially got sick about a week ago with body aches, generalized weakness, cough, and headache.  She had a home COVID test that was positive several days ago, and reports that she then had a confirmatory PCR.  The patient states that the cough and headache are better but she is still short of breath.  She went to the [place] clinic today and was found to be hypoxic to the 80s.  She was sent to the ED.  Past Medical History:  Diagnosis Date  . Chronic urticaria   . COPD (chronic obstructive pulmonary disease) ([place])   . DDD (degenerative disc disease)   . Diabetes mellitus ([place])   . GERD (gastroesophageal reflux disease)   . Hypertension   . Menopausal state   . Non-cardiac chest pain   . Ovarian cancer ([place]) 2012   s/p chemo   . Personal history of chemotherapy 2012   ovarian  . Pneumonia 1987    Patient Active Problem List   Diagnosis Date Noted  . Acute hypoxemic respiratory failure due to COVID-19 ([place]) 06/25/2020  . COPD with acute exacerbation ([place]) 01/19/2020  . Diabetes mellitus ([place])   . Encounter for antineoplastic chemotherapy 03/16/2015  . 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 Blood glucose elevated 10/11/2014  . Breathlessness on exertion 07/19/2014  . Chronic obstructive pulmonary disease ([place]) 04/20/2013  . Narrowing of intervertebral disc space  04/20/2013  . Benign essential HTN 04/20/2013  . HLD (hyperlipidemia) 04/20/2013  . Benign hypertension 04/20/2013  . Panlobular emphysema ([place]) 04/20/2013  . Pure hypercholesterolemia 04/20/2013  . COPD pft's pending 07/08/2012  . Chronic obstructive pulmonary disease with acute exacerbation ([place]) 07/08/2012  . 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 GERD (gastroesophageal reflux disease) 01/09/2011  . Malignant neoplasm of ovary ([place]) 12/16/2010    Past Surgical History:  Procedure Laterality Date  . APPENDECTOMY    . CHOLECYSTECTOMY    . FINGER ARTHROPLASTY Left 12/22/2014   Procedure: FINGER ARTHROPLASTY;  Surgeon: [name], MD;  Location: [facility] ORS;  Service: Orthopedics;  Laterality: Left;  . hysterectomy      Prior to Admission medications   Medication Sig Start Date End Date Taking? Authorizing Provider  albuterol (VENTOLIN HFA) 108 (90 Base) MCG/ACT inhaler Inhale 1-2 puffs into the lungs every 4 (four) hours as needed. 01/20/20  Yes [provider]  amLODipine (NORVASC) 5 MG tablet Take 5 mg by mouth daily. 01/18/20  Yes [provider]  aspirin 81 MG tablet Take 81 mg by mouth every evening.   Yes [provider]  atorvastatin (LIPITOR) 20 MG tablet Take 1 tablet by mouth daily. 04/29/20  Yes [provider]  celecoxib (CELEBREX) 200 MG capsule Take 200 mg by mouth daily. 05/08/20  Yes [provider]  glimepiride (AMARYL) 2 MG tablet Take 2 mg by mouth 2 (two) times daily.   Yes [provider]  LORazepam (ATIVAN) 1 MG tablet Take 1 mg by mouth 2 (two) times daily as needed for anxiety or sleep. 12/14/19  Yes [provider]  losartan (COZAAR) 100 MG tablet Take 1 tablet by mouth daily. 06/11/20  Yes [provider]  magnesium oxide (MAG-OX) 400 MG tablet Take 400 mg by mouth 2 (two) times daily.   Yes [provider]  metFORMIN (GLUCOPHAGE-XR) 500 MG 24 hr tablet Take 1,500 mg by  mouth daily with supper.   Yes [provider]  omeprazole (PRILOSEC) 40 MG capsule Take 1 capsule by mouth 2 (two) times daily. 04/16/20  Yes [provider]  propranolol (INDERAL) 10 MG tablet Take 10 mg by mouth 2 (two) times daily. 01/16/20  Yes [provider]  venlafaxine XR (EFFEXOR-XR) 150 MG 24 hr capsule Take 150 mg by mouth daily. (take with 37.5mg  capsule to equal 187.5mg  total)   Yes [provider]  venlafaxine XR (EFFEXOR-XR) 37.5 MG 24 hr capsule Take 37.5 mg by mouth daily. (take with 150mg  capsule to equal 187.5mg  total) 01/09/20  Yes [provider]  vitamin B-12 (CYANOCOBALAMIN) 1000 MCG tablet Take 1,000 mcg by mouth daily.   Yes [provider]  zolpidem (AMBIEN) 10 MG tablet Take 10 mg by mouth at bedtime as needed for sleep.   Yes [provider]    Allergies Iodinated diagnostic agents  Family History  Problem Relation Age of Onset  . Emphysema Father        smoked  . Breast cancer Mother 15  . Colon cancer Sister     Social History Social History   Tobacco Use  . Smoking status: Former Smoker    Packs/day: 2.00    Years: 42.00    Pack years: 84.00    Types: Cigarettes    Quit date: 07/05/2007    Years since quitting: 12.9  . Smokeless tobacco: Never Used  Substance Use Topics  . Alcohol use: No    Comment: occasional glass of wine once a month  . Drug use: No    Review of Systems  Constitutional: No fever/chills Eyes: No visual changes. ENT: No sore throat. Cardiovascular: Denies chest pain. Respiratory: Positive for shortness of breath. Gastrointestinal: No vomiting or diarrhea.  Genitourinary: Negative for dysuria.  Musculoskeletal: Positive for body aches. Skin: Negative for rash. Neurological: Positive for headache.   ____________________________________________   PHYSICAL EXAM:  VITAL SIGNS: ED Triage Vitals  Enc Vitals Group     BP 06/25/20 1610 (!) 145/72     Pulse  Rate 06/25/20 1610 75     Resp 06/25/20 1610 (!) 22     Temp 06/25/20 1610 98.5 F (36.9 C)     Temp Source 06/25/20 1610 Oral     SpO2 06/25/20 1610 99 %     Weight 06/25/20 1611 210 lb (95.3 kg)     Height 06/25/20 1611 5' 3.5" (1.613 m)     Head Circumference --      Peak Flow --      Pain Score --      Pain Loc --      Pain Edu? --      Excl. in [place]? --     Constitutional: Alert and oriented. Well appearing and in no acute distress. Eyes: Conjunctivae are normal.  Head: Atraumatic. Nose: No congestion/rhinnorhea. Mouth/Throat: Mucous membranes are moist.   Neck: Normal range of motion.  Cardiovascular: Normal rate, regular rhythm.  Good peripheral circulation. Respiratory: Normal  respiratory effort.  No retractions.  Gastrointestinal: No distention.  Musculoskeletal: No lower extremity edema.  Extremities warm and well perfused.  Neurologic:  Normal speech and language. No gross focal neurologic deficits are appreciated.  Skin:  Skin is warm and dry. No rash noted. Psychiatric: Mood and affect are normal. Speech and behavior are normal.  ____________________________________________   LABS (all labs ordered are listed, but only abnormal results are displayed)  Labs Reviewed  RESP PANEL BY RT-PCR (FLU A&B, COVID) ARPGX2 - Abnormal; Notable for the following components:      Result Value   SARS Coronavirus 2 by RT PCR POSITIVE (*)    All other components within normal limits  BASIC METABOLIC PANEL - Abnormal; Notable for the following components:   Sodium 134 (*)    Chloride 96 (*)    Glucose, Bld 178 (*)    BUN 27 (*)    Calcium 8.6 (*)    All other components within normal limits  LACTIC ACID, PLASMA - Abnormal; Notable for the following components:   Lactic Acid, Venous 2.3 (*)    All other components within normal limits  FIBRINOGEN - Abnormal; Notable for the following components:   Fibrinogen 646 (*)    All other components within normal limits  CULTURE,  BLOOD (ROUTINE X 2)  CULTURE, BLOOD (ROUTINE X 2)  CBC  PROCALCITONIN  LACTATE DEHYDROGENASE  FERRITIN  TRIGLYCERIDES  LACTIC ACID, PLASMA  C-REACTIVE PROTEIN  CBC WITH DIFFERENTIAL/PLATELET  COMPREHENSIVE METABOLIC PANEL  C-REACTIVE PROTEIN  D-DIMER, QUANTITATIVE  FERRITIN   ____________________________________________  EKG  ED ECG REPORT I, Arta Silence, the attending physician, personally viewed and interpreted this ECG.  Date: 06/25/2020 EKG Time: 1624 Rate: 79 Rhythm: normal sinus rhythm QRS Axis: normal Intervals: normal ST/T Wave abnormalities: Nonspecific abnormality Narrative Interpretation: Nonspecific abnormalities with no evidence of acute ischemia  ____________________________________________  RADIOLOGY  Chest x-ray interpreted by me shows no focal consolidation or edema  ____________________________________________   PROCEDURES  Procedure(s) performed: No  Procedures  Critical Care performed: No ____________________________________________   INITIAL IMPRESSION / ASSESSMENT AND PLAN / ED COURSE  Pertinent labs & imaging results that were available during my care of the patient were reviewed by me and considered in my medical decision making (see chart for details).  75 year old female with PMH as noted above including COPD (not on home O2) and diabetes presents with shortness of breath and hypoxia after recently being diagnosed with COVID.  The patient states that she is vaccinated and received 1 booster dose.    On exam, the patient is overall well-appearing.  Her vital signs are normal except for hypertension.  O2 saturation was in the 80s on room air but is now in the high 90s on 2 L by nasal cannula.  The patient is able to speak in full sentences and has no respiratory distress.  Overall presentation is consistent with hypoxia due to COVID.  The initial lab work-up is unremarkable.  Chest x-ray shows no focal infiltrate.  Will obtain  additional inflammatory markers, give dexamethasone, and plan for admission due to the hypoxia requiring oxygen.  ----------------------------------------- 11:00 PM on 06/25/2020 -----------------------------------------  I consulted Dr. Sidney Ace from the hospitalist service for admission.  __________________________  Kayla Wu was evaluated in Emergency Department on 06/25/2020 for the symptoms described in the history of present illness. She was evaluated in the context of the global COVID-19 pandemic, which necessitated consideration that the patient might be at risk for infection with the  SARS-CoV-2 virus that causes COVID-19. Institutional protocols and algorithms that pertain to the evaluation of patients at risk for COVID-19 are in a state of rapid change based on information released by regulatory bodies including the CDC and federal and state organizations. These policies and algorithms were followed during the patient's care in the ED.  ____________________________________________   FINAL CLINICAL IMPRESSION(S) / ED DIAGNOSES  Final diagnoses:  Acute respiratory failure with hypoxia ([place])  COVID-19      NEW MEDICATIONS STARTED DURING THIS VISIT:  New Prescriptions   No medications on file     Note:  This document was prepared using Dragon voice recognition software and may include unintentional dictation errors.   Arta Silence, MD 06/25/20 [phone]

## 2020-06-25 NOTE — Consult Note (Signed)
Remdesivir - Pharmacy Brief Note   O:  ALT: NNL (BL 01/2020 - 23) CXR: No active cardiopulmonary disease. SpO2: 95% on 2L    A/P:  Remdesivir 200 mg IVPB once followed by 100 mg IVPB daily x 4 days.   Lorna Dibble, [facility] 06/25/2020 10:42 PM

## 2020-06-25 NOTE — ED Triage Notes (Signed)
PT to ER from [facility] with c/o increasing [place] after being diagnosed with COVID 1 week ago.  Pt states cough productive of yellow sputum.  [place] reported RA sats in high 80s, pt placed on 2L [place].  PT denies fevers at home.

## 2020-06-26 DIAGNOSIS — U071 COVID-19: Secondary | ICD-10-CM | POA: Diagnosis not present

## 2020-06-26 DIAGNOSIS — J9601 Acute respiratory failure with hypoxia: Secondary | ICD-10-CM

## 2020-06-26 LAB — COMPREHENSIVE METABOLIC PANEL
ALT: 16 U/L (ref 0–44)
AST: 15 U/L (ref 15–41)
Albumin: 3.7 g/dL (ref 3.5–5.0)
Alkaline Phosphatase: 77 U/L (ref 38–126)
Anion gap: 13 (ref 5–15)
BUN: 32 mg/dL — ABNORMAL HIGH (ref 8–23)
CO2: 24 mmol/L (ref 22–32)
Calcium: 7.9 mg/dL — ABNORMAL LOW (ref 8.9–10.3)
Chloride: 98 mmol/L (ref 98–111)
Creatinine, Ser: 0.94 mg/dL (ref 0.44–1.00)
GFR, Estimated: >60 mL/min (ref >60)
Glucose, Bld: 323 mg/dL — ABNORMAL HIGH (ref 70–99)
Potassium: 4.6 mmol/L (ref 3.5–5.1)
Sodium: 135 mmol/L (ref 135–145)
Total Bilirubin: 0.5 mg/dL (ref 0.3–1.2)
Total Protein: 7.5 g/dL (ref 6.5–8.1)

## 2020-06-26 LAB — GLUCOSE, CAPILLARY
Glucose-Capillary: 348 mg/dL — ABNORMAL HIGH (ref 70–99)
Glucose-Capillary: 280 mg/dL — ABNORMAL HIGH (ref 70–99)
Glucose-Capillary: 356 mg/dL — ABNORMAL HIGH (ref 70–99)
Glucose-Capillary: 297 mg/dL — ABNORMAL HIGH (ref 70–99)

## 2020-06-26 LAB — CBC WITH DIFFERENTIAL/PLATELET
Abs Immature Granulocytes: 0.02 10*3/uL (ref 0.00–0.07)
Basophils Absolute: 0 10*3/uL (ref 0.0–0.1)
Basophils Relative: 0 %
Eosinophils Absolute: 0 10*3/uL (ref 0.0–0.5)
Eosinophils Relative: 0 %
HCT: 35.7 % — ABNORMAL LOW (ref 36.0–46.0)
Hemoglobin: 12.3 g/dL (ref 12.0–15.0)
Immature Granulocytes: 0 %
Lymphocytes Relative: 16 %
Lymphs Abs: 0.9 10*3/uL (ref 0.7–4.0)
MCH: 32.5 pg (ref 26.0–34.0)
MCHC: 34.5 g/dL (ref 30.0–36.0)
MCV: 94.2 fL (ref 80.0–100.0)
Monocytes Absolute: 0.1 10*3/uL (ref 0.1–1.0)
Monocytes Relative: 2 %
Neutro Abs: 4.6 10*3/uL (ref 1.7–7.7)
Neutrophils Relative %: 82 %
Platelets: 266 10*3/uL (ref 150–400)
RBC: 3.79 MIL/uL — ABNORMAL LOW (ref 3.87–5.11)
RDW: 12.3 % (ref 11.5–15.5)
WBC: 5.7 10*3/uL (ref 4.0–10.5)
nRBC: 0 % (ref 0.0–0.2)

## 2020-06-26 LAB — C-REACTIVE PROTEIN
CRP: 8.6 mg/dL — ABNORMAL HIGH (ref <1.0)
CRP: 10.1 mg/dL — ABNORMAL HIGH (ref <1.0)

## 2020-06-26 LAB — FERRITIN: Ferritin: 106 ng/mL (ref 11–307)

## 2020-06-26 LAB — HEMOGLOBIN A1C
Hgb A1c MFr Bld: 7.4 % — ABNORMAL HIGH (ref 4.8–5.6)
Mean Plasma Glucose: 166 mg/dL

## 2020-06-26 LAB — D-DIMER, QUANTITATIVE: D-Dimer, Quant: 0.9 ug/mL-FEU — ABNORMAL HIGH (ref 0.00–0.50)

## 2020-06-26 MED ORDER — ENOXAPARIN SODIUM 60 MG/0.6ML IJ SOSY
0.5000 mg/kg | PREFILLED_SYRINGE | INTRAMUSCULAR | Status: DC
  Administered 2020-06-26 – 2020-06-28 (×3): 47.5 mg via SUBCUTANEOUS
  Filled 2020-06-26 (×3): qty 0.6

## 2020-06-26 MED ORDER — INSULIN ASPART 100 UNIT/ML IJ SOLN
0.0000 [IU] | Freq: Three times a day (TID) | INTRAMUSCULAR | Status: DC
  Administered 2020-06-26: 8 [IU] via SUBCUTANEOUS
  Administered 2020-06-26: 11 [IU] via SUBCUTANEOUS
  Administered 2020-06-26: 15 [IU] via SUBCUTANEOUS
  Administered 2020-06-26: 8 [IU] via SUBCUTANEOUS
  Administered 2020-06-27 (×4): 5 [IU] via SUBCUTANEOUS
  Administered 2020-06-28: 11 [IU] via SUBCUTANEOUS
  Administered 2020-06-28: 5 [IU] via SUBCUTANEOUS
  Administered 2020-06-28 (×2): 11 [IU] via SUBCUTANEOUS
  Administered 2020-06-29 (×2): 8 [IU] via SUBCUTANEOUS
  Administered 2020-06-29 – 2020-06-30 (×2): 2 [IU] via SUBCUTANEOUS
  Filled 2020-06-26 (×15): qty 1

## 2020-06-26 MED ORDER — INSULIN GLARGINE 100 UNIT/ML ~~LOC~~ SOLN
5.0000 [IU] | Freq: Every day | SUBCUTANEOUS | Status: DC
  Administered 2020-06-26 – 2020-06-27 (×2): 5 [IU] via SUBCUTANEOUS
  Filled 2020-06-26 (×2): qty 0.05

## 2020-06-26 NOTE — Progress Notes (Signed)
PHARMACIST - PHYSICIAN COMMUNICATION  CONCERNING:  Enoxaparin (Lovenox) for DVT Prophylaxis    RECOMMENDATION: Patient was prescribed enoxaprin 40mg  q24 hours for VTE prophylaxis.   Filed Weights   06/25/20 1611  Weight: 95.3 kg (210 lb)    Body mass index is 36.62 kg/m.  Estimated Creatinine Clearance: 57.4 mL/min (by C-G formula based on SCr of 0.94 mg/dL).   Based on [place] patient is candidate for enoxaparin 0.5mg /kg TBW SQ every 24 hours based on BMI being >30.  DESCRIPTION: Pharmacy has adjusted enoxaparin dose per [facility] policy.  Patient is now receiving enoxaparin 0.5 mg/kg every 24 hours    Dallie Piles, PharmD Clinical Pharmacist  06/26/2020 9:21 AM

## 2020-06-26 NOTE — Plan of Care (Signed)
  Problem: Clinical Measurements: Goal: Respiratory complications will improve Outcome: Progressing   Problem: Activity: Goal: Risk for activity intolerance will decrease Outcome: Progressing   Problem: Safety: Goal: Ability to remain free from injury will improve Outcome: Progressing   

## 2020-06-26 NOTE — Progress Notes (Signed)
Physical Therapy Evaluation Patient Details Name: Kayla Wu MRN: [id number] DOB: 1945-09-24 Today's Date: 06/26/2020   History of Present Illness  75 y.o. female with medical history significant for COPD, diabetes mellitus, hypertension, GERD and degenerative disc disease, who presented to the ER with acute onset of feeling sick over the last week with associated dyspnea and cough productive of yellowish sputum as well as wheezing and generalized weakness with body aches.  She had positive Covid test at home, confirmed in-house.  Clinical Impression  Pt very pleasant and eager to work with PT.  She was on 2L on arrival with sats in the high 90s.  Spent nearly the entire session on room air with O2 sats varying from mid 90s to mid 80s but generally staying 88-91% during guarded, in-room ambulation w/o AD.  She did have some DOE as well as subjective fatigue with increased activity but was able to maintain relatively consistent cadence.  Nearing the end of the effort her O2 dropped to 83% with increasing fatigue.  Good overall effort, agrees that she is safe to return home w/o HHPT, will maintain on PT caseload while in-house to facilitate safe and regular mobility.      Follow Up Recommendations No PT follow up    Equipment Recommendations  None recommended by PT (O2 per continued course of hospitalization)    Recommendations for Other Services       Precautions / Restrictions Restrictions Weight Bearing Restrictions: No      Mobility  Bed Mobility Overal bed mobility: Modified Independent             General bed mobility comments: easily transitions to sitting EOB    Transfers Overall transfer level: Modified independent               General transfer comment: Pt able to rise and maintain balance w/o issue  Ambulation/Gait Ambulation/Gait assistance: Supervision Gait Distance (Feet): 200 Feet Assistive device: None       General Gait Details: multiple  loops in the room w/o AD and w/o O2.  Pt's O2 generally stayed 88-91% most of the time (dropping to mid 80s on occasion) and down to 83% by the end with DOE and shortness of breath.  Pt did have slow, guarded, but safe ambulation and generally showed guarded but appropriate gait.  A few standing rest breaks t/o the effort but no overt safety concerns.  Stairs            Wheelchair Mobility    Modified Rankin (Stroke Patients Only)       Balance Overall balance assessment: Modified Independent                                           Pertinent Vitals/Pain Pain Assessment: No/denies pain    Home Living Family/patient expects to be discharged to:: Private residence Living Arrangements: Spouse/significant other Available Help at Discharge: Available 24 hours/day;Family   Home Access: Level entry       Home Equipment: Walker - 2 wheels;Cane - single point      Prior Function Level of Independence: Independent         Comments: Pt has not had falls, does not need AD, can be active in the community/drive/etc     Hand Dominance        Extremity/Trunk Assessment   Upper Extremity Assessment Upper Extremity Assessment:  Overall [facility] for tasks assessed;Generalized weakness    Lower Extremity Assessment Lower Extremity Assessment: Overall WFL for tasks assessed;Generalized weakness       Communication   Communication: No difficulties  Cognition Arousal/Alertness: Awake/alert Behavior During Therapy: WFL for tasks assessed/performed Overall Cognitive Status: Within Functional Limits for tasks assessed                                        General Comments      Exercises     Assessment/Plan    PT Assessment Patient needs continued PT services  PT Problem List Decreased strength;Decreased range of motion;Decreased activity tolerance;Decreased balance;Decreased mobility;Decreased safety awareness;Cardiopulmonary status  limiting activity;Decreased knowledge of use of DME       PT Treatment Interventions DME instruction;Gait training;Functional mobility training;Therapeutic activities;Therapeutic exercise;Balance training;Patient/family education    PT Goals (Current goals can be found in the Care Plan section)  Acute Rehab PT Goals Patient Stated Goal: go home and get back to normal PT Goal Formulation: With patient Time For Goal Achievement: 07/10/20 Potential to Achieve Goals: Good    Frequency Min 2X/week   Barriers to discharge        Co-evaluation               AM-PAC PT "6 Clicks" Mobility  Outcome Measure Help needed turning from your back to your side while in a flat bed without using bedrails?: None Help needed moving from lying on your back to sitting on the side of a flat bed without using bedrails?: None Help needed moving to and from a bed to a chair (including a wheelchair)?: None Help needed standing up from a chair using your arms (e.g., wheelchair or bedside chair)?: None Help needed to walk in hospital room?: A Little Help needed climbing 3-5 steps with a railing? : A Little 6 Click Score: 22    End of Session   Activity Tolerance: Patient tolerated treatment well;Patient limited by fatigue Patient left: in bed;with call bell/phone within reach;with nursing/sitter in room Nurse Communication: Mobility status PT Visit Diagnosis: Muscle weakness (generalized) (M62.81);Difficulty in walking, not elsewhere classified (R26.2)    Time: 9528-4132 PT Time Calculation (min) (ACUTE ONLY): 27 min   Charges:   PT Evaluation $PT Eval Low Complexity: 1 Low PT Treatments $Gait Training: 8-22 mins        Kreg Shropshire, DPT 06/26/2020, 3:57 PM

## 2020-06-26 NOTE — Progress Notes (Signed)
PROGRESS NOTE  [name]  DOB: 1945-08-16  PCP: Idelle Crouch, MD MVE:[id number]  DOA: 06/25/2020  LOS: 1 day  Hospital Day: 2   Chief Complaint  Patient presents with  . Shortness of Breath    Brief narrative: Kayla Wu is a 75 y.o. female with PMH significant for COPD, diabetes mellitus, hypertension, GERD and degenerative disc disease. Patient presented to the ED on 5/23 with acute onset of feeling sick over the last week with associated dyspnea, cough productive of yellowish sputum, wheezing, generalized weakness with body aches, headaches without paresthesias or focal muscle weakness.    Also had nasal and sinus congestion and mildly diminished sense of taste and smell.   COVID test at home positive several days ago.   5/23, she was seen [facility] clinic and was noted to be hypoxemic with oxygen saturation in 80s and hence sent to the ED.   In the ED, patient required supplemental oxygen at 2 L/min. Labs showed sodium level at 134, potassium 4.2, glucose of 178, lactic acid elevated to 2.3, procalcitonin negative. Chest x-ray unremarkable EKG with 17 bpm and QTC 460 ms. COVID PCR positive. Patient was admitted to hospitalist service for further evaluation management.  Subjective: Patient was seen and examined this morning.  Pleasant elderly Caucasian female.  Sitting up in bed.  Not in distress.  On supplemental oxygen. No fever, on 2 L oxygen, hemodynamically stable Glucose level elevated to 348 this morning  Assessment/Plan: COVID infection Acute respiratory failure with hypoxia  -Presented with generalized weakness, cough, wheezing -COVID test: PCR positive -Chest imaging: Chest x-ray did not show any infiltrates -Treatment: On IV remdesivir and IV Solu-Medrol. -On 2 L oxygen by nasal cannula. -Oxygen - SpO2: 95 % O2 Flow Rate (L/min): 2 L/min -Supportive care: Vitamin C, Zinc, PRN inhalers, Tylenol, Antitussives (benzonatate/ Mucinex/Tussionex).    -Encouraged incentive spirometry, prone position, out of bed and early mobilization as much as possible -WBC and inflammatory markers trend as below. Recent Labs  Lab 06/25/20 1619 06/25/20 2230 06/26/20 0544  SARSCOV2NAA  --  POSITIVE*  --   WBC 8.1  --  5.7  LATICACIDVEN  --  2.3*  --   PROCALCITON  --  <0.10  --   DDIMER  --   --  0.90*  FERRITIN  --  93 106  LDH  --  165  --   CRP  --  8.6* 10.1*  ALT  --   --  16   Essential hypertension. -Continue Norvasc, Cozaar and Inderal.  Type 2 diabetes mellitus Hyperglycemia -A1c 6.8 on December 2021 -On glimepiride and metformin at home -Currently on sliding scale insulin with Accu-Cheks. -Blood sugar level is running high probably because of high-dose steroids. -I will give 5 units of Lantus this morning..  Continue to monitor Recent Labs  Lab 06/26/20 0754 06/26/20 1148  GLUCAP 348* 280*   Depression and anxiety. -We will continue Effexor XR and Ativan.  Vitamin B12 deficiency. -We will continue vitamin B12.  GERD. -We will continue PPI therapy.   Mobility: Encourage ambulation.  PT eval.  Previously independent at home. Code Status:   Code Status: DNR  Nutritional status: Body mass index is 36.62 kg/m.     Diet Order            Diet heart healthy/carb modified Room service appropriate? Yes; Fluid consistency: Thin  Diet effective now  DVT prophylaxis: Lovenox subcu   Antimicrobials:  None Fluid: Continue gentle hydration with normal saline at 50 mill per hour. Consultants: None Family Communication:  Not at bedside  Status is: Inpatient  Remains inpatient appropriate because: IV antiviral for COVID  Dispo: The patient is from: Home              Anticipated d/c is to: Likely home in 1 to 2 days              Patient currently is not medically stable to d/c.   Difficult to place patient No     Infusions:  . sodium chloride 100 mL/hr at 06/26/20 0647  . remdesivir 100 mg  in NS 100 mL      Scheduled Meds: . amLODipine  5 mg Oral Daily  . vitamin C  500 mg Oral Daily  . aspirin  81 mg Oral QPM  . atorvastatin  40 mg Oral QPM  . cholecalciferol  1,000 Units Oral Daily  . enoxaparin (LOVENOX) injection  0.5 mg/kg Subcutaneous Q24H  . famotidine  20 mg Oral BID  . glimepiride  2 mg Oral BID  . guaiFENesin  600 mg Oral BID  . insulin aspart  0-15 Units Subcutaneous TID AC & HS  . insulin glargine  5 Units Subcutaneous Daily  . losartan  50 mg Oral Daily  . magnesium oxide  400 mg Oral BID  . methylPREDNISolone (SOLU-MEDROL) injection  0.5 mg/kg Intravenous Q12H   Followed by  . [START ON 06/29/2020] predniSONE  50 mg Oral Daily  . propranolol  10 mg Oral BID  . venlafaxine XR  150 mg Oral Daily  . venlafaxine XR  37.5 mg Oral Daily  . vitamin B-12  1,000 mcg Oral Daily  . zinc sulfate  220 mg Oral Daily    Antimicrobials: Anti-infectives (From admission, onward)   Start     Dose/Rate Route Frequency Ordered Stop   06/26/20 1000  remdesivir 100 mg in sodium chloride 0.9 % 100 mL IVPB       "Followed by" Linked Group Details   100 mg 200 mL/hr over 30 Minutes Intravenous Daily 06/25/20 2226 06/30/20 0959   06/25/20 2345  remdesivir 200 mg in sodium chloride 0.9% 250 mL IVPB       "Followed by" Linked Group Details   200 mg 580 mL/hr over 30 Minutes Intravenous Once 06/25/20 2226 06/26/20 0237      PRN meds: chlorpheniramine-HYDROcodone, dextromethorphan-guaiFENesin, guaiFENesin-dextromethorphan, LORazepam, magnesium hydroxide, ondansetron **OR** ondansetron (ZOFRAN) IV, traZODone, zolpidem   Objective: Vitals:   06/26/20 0834 06/26/20 1152  BP: 135/70 (!) 143/59  Pulse: 85 84  Resp: 20 18  Temp: 98.5 F (36.9 C) 98 F (36.7 C)  SpO2: 93% 95%    Intake/Output Summary (Last 24 hours) at 06/26/2020 1411 Last data filed at 06/26/2020 0647 Gross per 24 hour  Intake 876.67 ml  Output 1 ml  Net 875.67 ml   Filed Weights   06/25/20 1611   Weight: 95.3 kg   Weight change:  Body mass index is 36.62 kg/m.   Physical Exam: General exam: Pleasant, elderly Caucasian female.  Not in distress Skin: No rashes, lesions or ulcers. HEENT: Atraumatic, normocephalic, no obvious bleeding Lungs: Clear to auscultate bilaterally.  No wheezing CVS: Regular rate and rhythm, no murmur GI/Abd soft, nontender, nondistended, bowel sound present CNS: Alert, awake, oriented x3 Psychiatry: Mood appropriate Extremities: No pedal edema, no calf tenderness  Data Review: I have personally reviewed  the laboratory data and studies available.  Recent Labs  Lab 06/25/20 1619 06/26/20 0544  WBC 8.1 5.7  NEUTROABS  --  4.6  HGB 12.5 12.3  HCT 37.8 35.7*  MCV 95.9 94.2  PLT 287 266   Recent Labs  Lab 06/25/20 1619 06/26/20 0544  NA 134* 135  K 4.2 4.6  CL 96* 98  CO2 26 24  GLUCOSE 178* 323*  BUN 27* 32*  CREATININE 0.90 0.94  CALCIUM 8.6* 7.9*    F/u labs ordered Unresulted Labs (From admission, onward)          Start     Ordered   06/26/20 0500  CBC with Differential/Platelet  Daily,   STAT      06/25/20 2226   06/26/20 0500  Comprehensive metabolic panel  Daily,   STAT      06/25/20 2226   06/26/20 0500  C-reactive protein  Daily,   STAT      06/25/20 2226   06/26/20 0500  D-dimer, quantitative  Daily,   STAT      06/25/20 2226   06/26/20 0500  Ferritin  Daily,   STAT      06/25/20 2226   06/26/20 0500  Hemoglobin A1c  Once,   STAT        06/26/20 0500          Signed, Terrilee Croak, MD [facility] 06/26/2020

## 2020-06-27 DIAGNOSIS — N390 Urinary tract infection, site not specified: Secondary | ICD-10-CM | POA: Diagnosis not present

## 2020-06-27 DIAGNOSIS — J9601 Acute respiratory failure with hypoxia: Secondary | ICD-10-CM | POA: Diagnosis not present

## 2020-06-27 DIAGNOSIS — A419 Sepsis, unspecified organism: Secondary | ICD-10-CM | POA: Diagnosis not present

## 2020-06-27 DIAGNOSIS — U071 COVID-19: Secondary | ICD-10-CM | POA: Diagnosis not present

## 2020-06-27 LAB — CBC WITH DIFFERENTIAL/PLATELET
Abs Immature Granulocytes: 0.04 10*3/uL (ref 0.00–0.07)
Basophils Absolute: 0 10*3/uL (ref 0.0–0.1)
Basophils Relative: 0 %
Eosinophils Absolute: 0 10*3/uL (ref 0.0–0.5)
Eosinophils Relative: 0 %
HCT: 32.6 % — ABNORMAL LOW (ref 36.0–46.0)
Hemoglobin: 11.6 g/dL — ABNORMAL LOW (ref 12.0–15.0)
Immature Granulocytes: 0 %
Lymphocytes Relative: 11 %
Lymphs Abs: 1.3 10*3/uL (ref 0.7–4.0)
MCH: 32.8 pg (ref 26.0–34.0)
MCHC: 35.6 g/dL (ref 30.0–36.0)
MCV: 92.1 fL (ref 80.0–100.0)
Monocytes Absolute: 0.6 10*3/uL (ref 0.1–1.0)
Monocytes Relative: 5 %
Neutro Abs: 10.2 10*3/uL — ABNORMAL HIGH (ref 1.7–7.7)
Neutrophils Relative %: 84 %
Platelets: 304 10*3/uL (ref 150–400)
RBC: 3.54 MIL/uL — ABNORMAL LOW (ref 3.87–5.11)
RDW: 12.2 % (ref 11.5–15.5)
WBC: 12.2 10*3/uL — ABNORMAL HIGH (ref 4.0–10.5)
nRBC: 0 % (ref 0.0–0.2)

## 2020-06-27 LAB — COMPREHENSIVE METABOLIC PANEL
ALT: 18 U/L (ref 0–44)
AST: 24 U/L (ref 15–41)
Albumin: 3.4 g/dL — ABNORMAL LOW (ref 3.5–5.0)
Alkaline Phosphatase: 70 U/L (ref 38–126)
Anion gap: 11 (ref 5–15)
BUN: 35 mg/dL — ABNORMAL HIGH (ref 8–23)
CO2: 23 mmol/L (ref 22–32)
Calcium: 8 mg/dL — ABNORMAL LOW (ref 8.9–10.3)
Chloride: 105 mmol/L (ref 98–111)
Creatinine, Ser: 0.86 mg/dL (ref 0.44–1.00)
GFR, Estimated: >60 mL/min (ref >60)
Glucose, Bld: 202 mg/dL — ABNORMAL HIGH (ref 70–99)
Potassium: 4.2 mmol/L (ref 3.5–5.1)
Sodium: 139 mmol/L (ref 135–145)
Total Bilirubin: 0.3 mg/dL (ref 0.3–1.2)
Total Protein: 7.3 g/dL (ref 6.5–8.1)

## 2020-06-27 LAB — GLUCOSE, CAPILLARY
Glucose-Capillary: 216 mg/dL — ABNORMAL HIGH (ref 70–99)
Glucose-Capillary: 207 mg/dL — ABNORMAL HIGH (ref 70–99)
Glucose-Capillary: 228 mg/dL — ABNORMAL HIGH (ref 70–99)
Glucose-Capillary: 249 mg/dL — ABNORMAL HIGH (ref 70–99)

## 2020-06-27 LAB — C-REACTIVE PROTEIN: CRP: 7.5 mg/dL — ABNORMAL HIGH (ref <1.0)

## 2020-06-27 LAB — LACTIC ACID, PLASMA: Lactic Acid, Venous: 2.3 mmol/L (ref 0.5–1.9)

## 2020-06-27 LAB — FERRITIN: Ferritin: 119 ng/mL (ref 11–307)

## 2020-06-27 LAB — D-DIMER, QUANTITATIVE: D-Dimer, Quant: 0.83 ug/mL-FEU — ABNORMAL HIGH (ref 0.00–0.50)

## 2020-06-27 MED ORDER — INSULIN GLARGINE 100 UNIT/ML ~~LOC~~ SOLN
5.0000 [IU] | Freq: Two times a day (BID) | SUBCUTANEOUS | Status: DC
  Administered 2020-06-27 – 2020-06-28 (×2): 5 [IU] via SUBCUTANEOUS
  Filled 2020-06-27 (×4): qty 0.05

## 2020-06-27 MED ORDER — ACETAMINOPHEN 325 MG PO TABS
650.0000 mg | ORAL_TABLET | ORAL | Status: DC | PRN
  Administered 2020-06-27 – 2020-06-30 (×6): 650 mg via ORAL
  Filled 2020-06-27 (×7): qty 2

## 2020-06-27 NOTE — Progress Notes (Signed)
PROGRESS NOTE    Kayla Wu  XTG:[id number] DOB: 1945/10/15 DOA: 06/25/2020 PCP: Idelle Crouch, MD    Brief Narrative:  Kayla Wu is a 75 y.o. Caucasian female with medical history significant for COPD, diabetes mellitus, hypertension, GERD and degenerative disc disease, who presented to the ER with acute onset of feeling sick over the last week with associated dyspnea and cough productive of yellowish sputum as well as wheezing and generalized weakness with body aches.  She admits to bad headaches without paresthesias or focal muscle weakness.  No nausea or vomiting or diarrhea.  She admitted to nasal and sinus congestion.  She had mildly diminished sense of taste and smell.  She had a home COVID test that was positive several days ago.  She also had a confirmatory PCR.  No dysuria, oliguria or hematuria or flank pain.  She denies any abdominal pain or bleeding diathesis.  She was seen [facility] clinic earlier today and was hypoxemic with a pulse ox into the 80s. ED Course: Upon presentation to the emergency room blood pressure was 145/72 with respiratory to 22 and her pulse oximetry was 99% on 2 L O2 by nasal cannula COVID-19 PCR came back positive and influenza antigens were negative  5/25- pt feeling little better, still with sob. No cp.   Consultants:     Procedures:   Antimicrobials:   remdesivir    Subjective: Still with sob, no cp. +cough  Objective: Vitals:   06/27/20 0423 06/27/20 0904 06/27/20 1224 06/27/20 1624  BP: 134/63 (!) 145/60 (!) 142/70 (!) 142/74  Pulse: 74 78 76 75  Resp: 16 20 18 18   Temp: 98.1 F (36.7 C) 97.8 F (36.6 C) 98.2 F (36.8 C) 98.4 F (36.9 C)  TempSrc: Oral Oral Oral Oral  SpO2: 93% 95% 98% 95%  Weight:      Height:        Intake/Output Summary (Last 24 hours) at 06/27/2020 1727 Last data filed at 06/27/2020 1606 Gross per 24 hour  Intake 1074 ml  Output --  Net 1074 ml   Filed Weights   06/25/20 1611  Weight:  95.3 kg    Examination:  General exam: Appears calm and comfortable  Respiratory system: Decreased breath sounds no wheezing Cardiovascular system: S1 & S2 heard, RRR.  No gallop  Gastrointestinal system: Abdomen is nondistended, soft and nontender.Normal bowel sounds heard. Central nervous system: Alert and oriented.  Grossly intact Extremities: No edema Skin: Warm dry Psychiatry:  Mood & affect appropriate.     Data Reviewed: I have personally reviewed following labs and imaging studies  CBC: Recent Labs  Lab 06/25/20 1619 06/26/20 0544 06/27/20 0441  WBC 8.1 5.7 12.2*  NEUTROABS  --  4.6 10.2*  HGB 12.5 12.3 11.6*  HCT 37.8 35.7* 32.6*  MCV 95.9 94.2 92.1  PLT 287 266 270   Basic Metabolic Panel: Recent Labs  Lab 06/25/20 1619 06/26/20 0544 06/27/20 0441  NA 134* 135 139  K 4.2 4.6 4.2  CL 96* 98 105  CO2 26 24 23   GLUCOSE 178* 323* 202*  BUN 27* 32* 35*  CREATININE 0.90 0.94 0.86  CALCIUM 8.6* 7.9* 8.0*   GFR: Estimated Creatinine Clearance: 62.7 mL/min (by C-G formula based on SCr of 0.86 mg/dL). Liver Function Tests: Recent Labs  Lab 06/26/20 0544 06/27/20 0441  AST 15 24  ALT 16 18  ALKPHOS 77 70  BILITOT 0.5 0.3  PROT 7.5 7.3  ALBUMIN 3.7 3.4*  No results for input(s): LIPASE, AMYLASE in the last 168 hours. No results for input(s): AMMONIA in the last 168 hours. Coagulation Profile: No results for input(s): INR, PROTIME in the last 168 hours. Cardiac Enzymes: No results for input(s): CKTOTAL, CKMB, CKMBINDEX, TROPONINI in the last 168 hours. BNP (last 3 results) No results for input(s): PROBNP in the last 8760 hours. HbA1C: Recent Labs    06/26/20 0544  HGBA1C 7.4*   CBG: Recent Labs  Lab 06/26/20 1633 06/26/20 2155 06/27/20 0755 06/27/20 1152 06/27/20 1621  GLUCAP 356* 297* 216* 207* 228*   Lipid Profile: Recent Labs    06/25/20 2230  TRIG 110   Thyroid Function Tests: No results for input(s): TSH, T4TOTAL, FREET4,  T3FREE, THYROIDAB in the last 72 hours. Anemia Panel: Recent Labs    06/26/20 0544 06/27/20 0441  FERRITIN 106 119   Sepsis Labs: Recent Labs  Lab 06/25/20 2230 06/27/20 0955  PROCALCITON <0.10  --   LATICACIDVEN 2.3* 2.3*    Recent Results (from the past 240 hour(s))  Blood Culture (routine x 2)     Status: None (Preliminary result)   Collection Time: 06/25/20 10:20 PM   Specimen: BLOOD  Result Value Ref Range Status   Specimen Description BLOOD BLOOD RIGHT WRIST  Final   Special Requests   Final    BOTTLES DRAWN AEROBIC AND ANAEROBIC Blood Culture adequate volume   Culture   Final    NO GROWTH 2 DAYS Performed at [facility], [address] [place], [place] [address]    Report Status PENDING  Incomplete  Blood Culture (routine x 2)     Status: None (Preliminary result)   Collection Time: 06/25/20 10:30 PM   Specimen: BLOOD  Result Value Ref Range Status   Specimen Description BLOOD BLOOD LEFT HAND  Final   Special Requests   Final    BOTTLES DRAWN AEROBIC AND ANAEROBIC Blood Culture adequate volume   Culture   Final    NO GROWTH 2 DAYS Performed at [facility], [address] [place], [place] [address]    Report Status PENDING  Incomplete  Resp Panel by RT-PCR (Flu A&B, Covid) Nasopharyngeal Swab     Status: Abnormal   Collection Time: 06/25/20 10:30 PM   Specimen: Nasopharyngeal Swab; Nasopharyngeal(NP) swabs in vial transport medium  Result Value Ref Range Status   SARS Coronavirus 2 by RT PCR POSITIVE (A) NEGATIVE Final    Comment: RESULT CALLED TO, READ BACK BY AND VERIFIED WITH: MIMI FAGGART RN 2348 06/25/20 HNM (NOTE) SARS-CoV-2 target nucleic acids are DETECTED.  The SARS-CoV-2 RNA is generally detectable in upper respiratory specimens during the acute phase of infection. Positive results are indicative of the presence of the identified virus, but do not rule out bacterial infection or co-infection with other pathogens  not detected by the test. Clinical correlation with patient history and other diagnostic information is necessary to determine patient infection status. The expected result is Negative.  Fact Sheet for Patients: EntrepreneurPulse.com.au  Fact Sheet for Healthcare Providers: IncredibleEmployment.be  This test is not yet approved or cleared by the [place] FDA and  has been authorized for detection and/or diagnosis of SARS-CoV-2 by FDA under an Emergency Use Authorization (EUA).  This EUA will remain in effect (meaning this test can be  used) for the duration of  the COVID-19 declaration under Section 564(b)(1) of the Act, 21 U.S.C. section 360bbb-3(b)(1), unless the authorization is terminated or revoked sooner.  Influenza A by PCR NEGATIVE NEGATIVE Final   Influenza B by PCR NEGATIVE NEGATIVE Final    Comment: (NOTE) The Xpert Xpress SARS-CoV-2/FLU/RSV plus assay is intended as an aid in the diagnosis of influenza from Nasopharyngeal swab specimens and should not be used as a sole basis for treatment. Nasal washings and aspirates are unacceptable for Xpert Xpress SARS-CoV-2/FLU/RSV testing.  Fact Sheet for Patients: EntrepreneurPulse.com.au  Fact Sheet for Healthcare Providers: IncredibleEmployment.be  This test is not yet approved or cleared by the [place] FDA and has been authorized for detection and/or diagnosis of SARS-CoV-2 by FDA under an Emergency Use Authorization (EUA). This EUA will remain in effect (meaning this test can be used) for the duration of the COVID-19 declaration under Section 564(b)(1) of the Act, 21 U.S.C. section 360bbb-3(b)(1), unless the authorization is terminated or revoked.  Performed at [facility], [address], [place], [place] [address]          Radiology Studies: No results found.      Scheduled Meds: . amLODipine  5 mg Oral  Daily  . vitamin C  500 mg Oral Daily  . aspirin  81 mg Oral QPM  . atorvastatin  40 mg Oral QPM  . cholecalciferol  1,000 Units Oral Daily  . enoxaparin (LOVENOX) injection  0.5 mg/kg Subcutaneous Q24H  . famotidine  20 mg Oral BID  . glimepiride  2 mg Oral BID  . guaiFENesin  600 mg Oral BID  . insulin aspart  0-15 Units Subcutaneous TID AC & HS  . insulin glargine  5 Units Subcutaneous BID  . losartan  50 mg Oral Daily  . magnesium oxide  400 mg Oral BID  . methylPREDNISolone (SOLU-MEDROL) injection  0.5 mg/kg Intravenous Q12H   Followed by  . [START ON 06/29/2020] predniSONE  50 mg Oral Daily  . propranolol  10 mg Oral BID  . venlafaxine XR  150 mg Oral Daily  . venlafaxine XR  37.5 mg Oral Daily  . vitamin B-12  1,000 mcg Oral Daily  . zinc sulfate  220 mg Oral Daily   Continuous Infusions: . remdesivir 100 mg in NS 100 mL Stopped (06/27/20 0935)    Assessment & Plan:   Active Problems:   Acute hypoxemic respiratory failure due to COVID-19 [facility])   Sepsis due to urinary tract infection ([place])     1.  Acute hypoxemic respiratory failure due to COVID-19. Still symptomatic Continue IV steroids and remdesivir CRP trending down  Continue to follow inflammatory markers Continue vitamin C, D3, zinc and aspirin Holding antibiotics her procalcitonin was less than 0.1 D dimer trending down   2.  Essential hypertension. Stable Continue Norvasc, Cozaar, Inderal    3.  Type 2 diabetes mellitus. A1c 7.4 BG elevated likely due to steroids Increase Lantus to 5 units twice daily riss  4.  Depression and anxiety. Continue Effexor XR and Ativan   5.  Vitamin B12 deficiency.  continue vitamin B12.  6.  GERD.  continue PPI therapy.     DVT prophylaxis: Lovenox Code Status: DNR Family Communication: none at bedside  Status is: Inpatient  Remains inpatient appropriate because:Inpatient level of care appropriate due to severity of illness   Dispo: The  patient is from: Home              Anticipated d/c is to: Home              Patient currently is not medically stable to d/c.  Difficult to place patient No            LOS: 2 days  Time spent: 35 minutes with more than 50% on [place], MD [facility] Pager [phone]  If 7PM-7AM, please contact night-coverage 06/27/2020, 5:27 PM

## 2020-06-27 NOTE — TOC Initial Note (Signed)
Transition of Care [facility]) - Initial/Assessment Note    Patient Details  Name: Kayla Wu MRN: [id number] Date of Birth: 1945-03-01  [facility] CM/SW Contact:    Beverly Sessions, RN Phone Number: 06/27/2020, 12:44 PM  Clinical Narrative:                 Patient admitted from home with SOB related to covid Patient lives at home with husband  PCP [facility] - denies issues obtaining medications. Family provides transportation  PT has assessed patient and does not recommend any additional follow up Patient has RW and cane in the home  Patient currently requiring acute O2   Expected Discharge Plan: Home/Self Care Barriers to Discharge: Continued Medical Work up   Patient Goals and CMS Choice        Expected Discharge Plan and Services Expected Discharge Plan: Home/Self Care       Living arrangements for the past 2 months: Single Family Home                                      Prior Living Arrangements/Services Living arrangements for the past 2 months: Single Family Home Lives with:: Spouse Patient language and need for interpreter reviewed:: Yes Do you feel safe going back to the place where you live?: Yes      Need for Family Participation in Patient Care: Yes (Comment)   Current home services: DME Criminal Activity/Legal Involvement Pertinent to Current Situation/Hospitalization: No - Comment as needed  Activities of Daily Living Home Assistive Devices/Equipment: None ADL Screening (condition at time of admission) Patient's cognitive ability adequate to safely complete daily activities?: Yes Is the patient deaf or have difficulty hearing?: No Does the patient have difficulty seeing, even when wearing glasses/contacts?: No Does the patient have difficulty concentrating, remembering, or making decisions?: No Patient able to express need for assistance with ADLs?: Yes Does the patient have difficulty dressing or  bathing?: No Independently performs ADLs?: Yes (appropriate for developmental age) Does the patient have difficulty walking or climbing stairs?: Yes Weakness of Legs: None Weakness of Arms/Hands: None  Permission Sought/Granted                  Emotional Assessment       Orientation: : Oriented to Self,Oriented to Place,Oriented to  Time,Oriented to Situation Alcohol / Substance Use: Not Applicable Psych Involvement: No (comment)  Admission diagnosis:  Acute respiratory failure with hypoxia ([place]) [J96.01] Sepsis due to urinary tract infection ([place]) [A41.9, N39.0] Acute hypoxemic respiratory failure due to COVID-19 ([place]) [U07.1, J96.01] COVID-19 [U07.1] Patient Active Problem List   Diagnosis Date Noted  . Acute hypoxemic respiratory failure due to COVID-19 ([place]) 06/25/2020  . Sepsis due to urinary tract infection ([place]) 06/25/2020  . COPD with acute exacerbation ([place]) 01/19/2020  . Diabetes mellitus ([place])   . Encounter for antineoplastic chemotherapy 03/16/2015  . 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 Blood glucose elevated 10/11/2014  . Breathlessness on exertion 07/19/2014  . Chronic obstructive pulmonary disease ([place]) 04/20/2013  . Narrowing of intervertebral disc space 04/20/2013  . Benign essential HTN 04/20/2013  . HLD (hyperlipidemia) 04/20/2013  . Benign hypertension 04/20/2013  . Panlobular emphysema ([place]) 04/20/2013  . Pure hypercholesterolemia 04/20/2013  . COPD pft's pending 07/08/2012  . Chronic obstructive pulmonary disease with acute exacerbation ([place]) 07/08/2012  .  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 GERD (gastroesophageal reflux disease) 01/09/2011  . Malignant neoplasm of ovary ([place]) 12/16/2010   PCP:  Idelle Crouch, MD Pharmacy:   [facility] [address], [place] - [place] [address] [place] [address] Phone: [phone] Fax: [phone]     Social  Determinants of Health (SDOH) Interventions    Readmission Risk Interventions Readmission Risk Prevention Plan 01/20/2020  Transportation Screening Complete  PCP or Specialist Appt within 5-7 Days Complete  Home Care Screening Complete  Medication Review (RN CM) Complete  Some recent data might be hidden

## 2020-06-27 NOTE — Progress Notes (Signed)
Mobility Specialist - Progress Note   06/27/20 1200  Mobility  Activity Ambulated to bathroom  Level of Assistance Modified independent, requires aide device or extra time  Assistive Device None (IV Pole)  Distance Ambulated (ft) 25 ft  Mobility Out of bed for toileting  Mobility Response Tolerated well  Mobility performed by Mobility specialist  $Mobility charge 1 Mobility    Pt was attempting to ambulate to the bathroom on RA upon arrival. No LOB. O2 desat to a low of 86%, but increased to 94% once [place] reapplied on 2L. HR 70-80 bpm. PLB engaged. Lunch tray arrived and was placed in front of pt prior to exit.  Kathee Delton Mobility Specialist 06/27/20, 12:07 PM

## 2020-06-27 NOTE — Progress Notes (Signed)
Inpatient Diabetes Program Recommendations  AACE/ADA: New Consensus Statement on Inpatient Glycemic Control   Target Ranges:  Prepandial:   less than 140 mg/dL      Peak postprandial:   less than 180 mg/dL (1-2 hours)      Critically ill patients:  140 - 180 mg/dL  Results for ADELAE, YODICE (MRN [id number]) as of 06/27/2020 09:04  Ref. Range 06/27/2020 04:41  Glucose Latest Ref Range: 70 - 99 mg/dL 202 (H)   Results for DAMILOLA, FLAMM (MRN [id number]) as of 06/27/2020 09:04  Ref. Range 06/26/2020 07:54 06/26/2020 11:48 06/26/2020 16:33 06/26/2020 21:55  Glucose-Capillary Latest Ref Range: 70 - 99 mg/dL 348 (H) 280 (H) 356 (H) 297 (H)   Review of Glycemic Control  Diabetes history: DM2 Outpatient Diabetes medications: Amaryl 2 mg BID, Metformin XR 1500 mg daily Current orders for Inpatient glycemic control: Lantus 5 units daily, Novolog 0-15 units AC&HS, Amaryl 2 mg BID; Solumedrol 47.5 mg Q12H  Inpatient Diabetes Program Recommendations:    Insulin: If steroids are continued as ordered, please consider increasing Lantus to 10 units daily (order one time Lantus 5 units x1 for now since already given 5 untis this morning) and ordering Novolog 4 units TID with meals for meal coverage if patient eats at least 50% of meals.  Thanks, Barnie Alderman, RN, MSN, CDE Diabetes Coordinator Inpatient Diabetes Program [phone] (Team Pager from 8am to 5pm)

## 2020-06-28 DIAGNOSIS — J9601 Acute respiratory failure with hypoxia: Secondary | ICD-10-CM | POA: Diagnosis not present

## 2020-06-28 DIAGNOSIS — A419 Sepsis, unspecified organism: Secondary | ICD-10-CM | POA: Diagnosis not present

## 2020-06-28 DIAGNOSIS — E0865 Diabetes mellitus due to underlying condition with hyperglycemia: Secondary | ICD-10-CM | POA: Diagnosis not present

## 2020-06-28 DIAGNOSIS — U071 COVID-19: Secondary | ICD-10-CM | POA: Diagnosis not present

## 2020-06-28 LAB — LACTIC ACID, PLASMA: Lactic Acid, Venous: 4.4 mmol/L (ref 0.5–1.9)

## 2020-06-28 LAB — GLUCOSE, CAPILLARY
Glucose-Capillary: 231 mg/dL — ABNORMAL HIGH (ref 70–99)
Glucose-Capillary: 329 mg/dL — ABNORMAL HIGH (ref 70–99)
Glucose-Capillary: 331 mg/dL — ABNORMAL HIGH (ref 70–99)
Glucose-Capillary: 309 mg/dL — ABNORMAL HIGH (ref 70–99)

## 2020-06-28 LAB — COMPREHENSIVE METABOLIC PANEL
ALT: 18 U/L (ref 0–44)
AST: 21 U/L (ref 15–41)
Albumin: 3.2 g/dL — ABNORMAL LOW (ref 3.5–5.0)
Alkaline Phosphatase: 69 U/L (ref 38–126)
Anion gap: 11 (ref 5–15)
BUN: 26 mg/dL — ABNORMAL HIGH (ref 8–23)
CO2: 25 mmol/L (ref 22–32)
Calcium: 8.4 mg/dL — ABNORMAL LOW (ref 8.9–10.3)
Chloride: 103 mmol/L (ref 98–111)
Creatinine, Ser: 0.85 mg/dL (ref 0.44–1.00)
GFR, Estimated: >60 mL/min (ref >60)
Glucose, Bld: 239 mg/dL — ABNORMAL HIGH (ref 70–99)
Potassium: 4.3 mmol/L (ref 3.5–5.1)
Sodium: 139 mmol/L (ref 135–145)
Total Bilirubin: 0.4 mg/dL (ref 0.3–1.2)
Total Protein: 6.7 g/dL (ref 6.5–8.1)

## 2020-06-28 LAB — CBC WITH DIFFERENTIAL/PLATELET
Abs Immature Granulocytes: 0.08 10*3/uL — ABNORMAL HIGH (ref 0.00–0.07)
Basophils Absolute: 0 10*3/uL (ref 0.0–0.1)
Basophils Relative: 0 %
Eosinophils Absolute: 0 10*3/uL (ref 0.0–0.5)
Eosinophils Relative: 0 %
HCT: 33 % — ABNORMAL LOW (ref 36.0–46.0)
Hemoglobin: 11.2 g/dL — ABNORMAL LOW (ref 12.0–15.0)
Immature Granulocytes: 1 %
Lymphocytes Relative: 11 %
Lymphs Abs: 1.3 10*3/uL (ref 0.7–4.0)
MCH: 32.2 pg (ref 26.0–34.0)
MCHC: 33.9 g/dL (ref 30.0–36.0)
MCV: 94.8 fL (ref 80.0–100.0)
Monocytes Absolute: 0.4 10*3/uL (ref 0.1–1.0)
Monocytes Relative: 3 %
Neutro Abs: 10.1 10*3/uL — ABNORMAL HIGH (ref 1.7–7.7)
Neutrophils Relative %: 85 %
Platelets: 332 10*3/uL (ref 150–400)
RBC: 3.48 MIL/uL — ABNORMAL LOW (ref 3.87–5.11)
RDW: 12.3 % (ref 11.5–15.5)
WBC: 11.9 10*3/uL — ABNORMAL HIGH (ref 4.0–10.5)
nRBC: 0 % (ref 0.0–0.2)

## 2020-06-28 LAB — FERRITIN: Ferritin: 112 ng/mL (ref 11–307)

## 2020-06-28 LAB — D-DIMER, QUANTITATIVE: D-Dimer, Quant: 0.66 ug/mL-FEU — ABNORMAL HIGH (ref 0.00–0.50)

## 2020-06-28 LAB — C-REACTIVE PROTEIN: CRP: 3.3 mg/dL — ABNORMAL HIGH (ref <1.0)

## 2020-06-28 MED ORDER — INSULIN ASPART 100 UNIT/ML IJ SOLN
3.0000 [IU] | Freq: Three times a day (TID) | INTRAMUSCULAR | Status: DC
  Administered 2020-06-28 (×2): 3 [IU] via SUBCUTANEOUS

## 2020-06-28 MED ORDER — INSULIN ASPART 100 UNIT/ML IJ SOLN
6.0000 [IU] | Freq: Three times a day (TID) | INTRAMUSCULAR | Status: DC
  Administered 2020-06-29 – 2020-06-30 (×4): 6 [IU] via SUBCUTANEOUS
  Filled 2020-06-28 (×4): qty 1

## 2020-06-28 MED ORDER — INSULIN GLARGINE 100 UNIT/ML ~~LOC~~ SOLN
10.0000 [IU] | Freq: Two times a day (BID) | SUBCUTANEOUS | Status: DC
  Administered 2020-06-28: 10 [IU] via SUBCUTANEOUS
  Filled 2020-06-28 (×3): qty 0.1

## 2020-06-28 NOTE — Progress Notes (Signed)
Mobility Specialist - Progress Note   06/28/20 1528  Mobility  Activity Ambulated in room  Level of Assistance Modified independent, requires aide device or extra time  Assistive Device None  Distance Ambulated (ft) 180 ft  Mobility Ambulated independently in room  Mobility Response Tolerated well  Mobility performed by Mobility specialist  $Mobility charge 1 Mobility    O2 while resting on RA = 91% O2 while AMB on RA = 86% O2 while AMB on 2L = 90%   Pt ambulated in room on RA. Denied SOB throughout session. O2 fluctuating 86-88% during ambulation on RA. 1 seated rest break taken, encouraged by mobility tech. Noted pt desats with increased distance (after ~75'). Pt educated on activity pacing, pt showed understanding. Pt left on 2L with sats sitting at 94% prior to exit.   Kathee Delton Mobility Specialist 06/28/20, 3:40 PM

## 2020-06-28 NOTE — TOC Progression Note (Signed)
Transition of Care [facility]) - Progression Note    Patient Details  Name: Kayla Wu MRN: [id number] Date of Birth: 21-Nov-1945  Transition of Care [facility]) CM/SW Contact  Beverly Sessions, RN Phone Number: 06/28/2020, 2:16 PM  Clinical Narrative:    Patient remains on 2L O2 Staff to check qualifying sats for home O2   Expected Discharge Plan: Home/Self Care Barriers to Discharge: Continued Medical Work up  Expected Discharge Plan and Services Expected Discharge Plan: Home/Self Care       Living arrangements for the past 2 months: Single Family Home                                       Social Determinants of Health (SDOH) Interventions    Readmission Risk Interventions Readmission Risk Prevention Plan 01/20/2020  Transportation Screening Complete  PCP or Specialist Appt within 5-7 Days Complete  Home Care Screening Complete  Medication Review (RN CM) Complete  Some recent data might be hidden

## 2020-06-28 NOTE — Progress Notes (Signed)
Mobility Specialist - Progress Note   06/28/20 1200  Mobility  Activity Ambulated in room;Ambulated to bathroom  Level of Assistance Modified independent, requires aide device or extra time  Assistive Device None  Distance Ambulated (ft) 150 ft  Mobility Ambulated independently in room  Mobility Response Tolerated well  Mobility performed by Mobility specialist  $Mobility charge 1 Mobility    Pre-mobility: 64 HR, 92% SpO2 During mobility: 78 HR, 86% SpO2 Post-mobility: 68 HR, 95% SpO2   Pt ambulated in room without AD. No LOB. Denied dizziness. Pt unable to determine if SOB during activity, states "I can't really tell honestly". O2 monitored, ranging 86-88% on 2L. Reports feeling good during ambulation. Urinal output prior to return to bed, independent in peri-care. Pt returned to bed, lunch tray arrived at the end of session and was placed in front of pt.    Kathee Delton Mobility Specialist 06/28/20, 12:27 PM

## 2020-06-28 NOTE — Progress Notes (Signed)
PROGRESS NOTE    Kayla Wu  YQI:[id number] DOB: 12/26/1945 DOA: 06/25/2020 PCP: Idelle Crouch, MD    Brief Narrative:  Kayla Wu is a 75 y.o. Caucasian female with medical history significant for COPD, diabetes mellitus, hypertension, GERD and degenerative disc disease, who presented to the ER with acute onset of feeling sick over the last week with associated dyspnea and cough productive of yellowish sputum as well as wheezing and generalized weakness with body aches.  She admits to bad headaches without paresthesias or focal muscle weakness.  No nausea or vomiting or diarrhea.  She admitted to nasal and sinus congestion.  She had mildly diminished sense of taste and smell.  She had a home COVID test that was positive several days ago.  She also had a confirmatory PCR.  No dysuria, oliguria or hematuria or flank pain.  She denies any abdominal pain or bleeding diathesis.  She was seen [facility] clinic earlier today and was hypoxemic with a pulse ox into the 80s. ED Course: Upon presentation to the emergency room blood pressure was 145/72 with respiratory to 22 and her pulse oximetry was 99% on 2 L O2 by nasal cannula COVID-19 PCR came back positive and influenza antigens were negative  5/25- pt feeling little better, still with sob. No cp.  5/26-feels congested in the face. On 2L 02. Feels sob improving, but not sure if better with ambulation  Consultants:     Procedures:   Antimicrobials:   remdesivir    Subjective: As above. Some cough, no cp  Objective: Vitals:   06/27/20 1624 06/27/20 2010 06/28/20 0040 06/28/20 0354  BP: (!) 142/74 (!) 153/74 (!) 137/59 (!) 147/61  Pulse: 75 70 66 70  Resp: 18 18 15 17   Temp: 98.4 F (36.9 C) 98.2 F (36.8 C) 98 F (36.7 C) 97.8 F (36.6 C)  TempSrc: Oral  Oral Oral  SpO2: 95% 95% 93% 95%  Weight:      Height:        Intake/Output Summary (Last 24 hours) at 06/28/2020 0910 Last data filed at 06/27/2020 1606 Gross  per 24 hour  Intake 100 ml  Output --  Net 100 ml   Filed Weights   06/25/20 1611  Weight: 95.3 kg    Examination: Calm, nad Decrease bs at bases, no wheezing Regular s1/s2 no gallop Soft benign+bs No edema Awake and alert, grossly intact   Data Reviewed: I have personally reviewed following labs and imaging studies  CBC: Recent Labs  Lab 06/25/20 1619 06/26/20 0544 06/27/20 0441 06/28/20 0523  WBC 8.1 5.7 12.2* 11.9*  NEUTROABS  --  4.6 10.2* 10.1*  HGB 12.5 12.3 11.6* 11.2*  HCT 37.8 35.7* 32.6* 33.0*  MCV 95.9 94.2 92.1 94.8  PLT 287 266 304 387   Basic Metabolic Panel: Recent Labs  Lab 06/25/20 1619 06/26/20 0544 06/27/20 0441 06/28/20 0523  NA 134* 135 139 139  K 4.2 4.6 4.2 4.3  CL 96* 98 105 103  CO2 26 24 23 25   GLUCOSE 178* 323* 202* 239*  BUN 27* 32* 35* 26*  CREATININE 0.90 0.94 0.86 0.85  CALCIUM 8.6* 7.9* 8.0* 8.4*   GFR: Estimated Creatinine Clearance: 63.5 mL/min (by C-G formula based on SCr of 0.85 mg/dL). Liver Function Tests: Recent Labs  Lab 06/26/20 0544 06/27/20 0441 06/28/20 0523  AST 15 24 21   ALT 16 18 18   ALKPHOS 77 70 69  BILITOT 0.5 0.3 0.4  PROT 7.5 7.3 6.7  ALBUMIN 3.7 3.4* 3.2*   No results for input(s): LIPASE, AMYLASE in the last 168 hours. No results for input(s): AMMONIA in the last 168 hours. Coagulation Profile: No results for input(s): INR, PROTIME in the last 168 hours. Cardiac Enzymes: No results for input(s): CKTOTAL, CKMB, CKMBINDEX, TROPONINI in the last 168 hours. BNP (last 3 results) No results for input(s): PROBNP in the last 8760 hours. HbA1C: Recent Labs    06/26/20 0544  HGBA1C 7.4*   CBG: Recent Labs  Lab 06/27/20 0755 06/27/20 1152 06/27/20 1621 06/27/20 2012 06/28/20 0734  GLUCAP 216* 207* 228* 249* 231*   Lipid Profile: Recent Labs    06/25/20 2230  TRIG 110   Thyroid Function Tests: No results for input(s): TSH, T4TOTAL, FREET4, T3FREE, THYROIDAB in the last 72  hours. Anemia Panel: Recent Labs    06/27/20 0441 06/28/20 0523  FERRITIN 119 112   Sepsis Labs: Recent Labs  Lab 06/25/20 2230 06/27/20 0955  PROCALCITON <0.10  --   LATICACIDVEN 2.3* 2.3*    Recent Results (from the past 240 hour(s))  Blood Culture (routine x 2)     Status: None (Preliminary result)   Collection Time: 06/25/20 10:20 PM   Specimen: BLOOD  Result Value Ref Range Status   Specimen Description BLOOD BLOOD RIGHT WRIST  Final   Special Requests   Final    BOTTLES DRAWN AEROBIC AND ANAEROBIC Blood Culture adequate volume   Culture   Final    NO GROWTH 2 DAYS Performed at [facility], [address], [place], [place] [address]    Report Status PENDING  Incomplete  Blood Culture (routine x 2)     Status: None (Preliminary result)   Collection Time: 06/25/20 10:30 PM   Specimen: BLOOD  Result Value Ref Range Status   Specimen Description BLOOD BLOOD LEFT HAND  Final   Special Requests   Final    BOTTLES DRAWN AEROBIC AND ANAEROBIC Blood Culture adequate volume   Culture   Final    NO GROWTH 2 DAYS Performed at [facility], [address], [place], [place] [address]    Report Status PENDING  Incomplete  Resp Panel by RT-PCR (Flu A&B, Covid) Nasopharyngeal Swab     Status: Abnormal   Collection Time: 06/25/20 10:30 PM   Specimen: Nasopharyngeal Swab; Nasopharyngeal(NP) swabs in vial transport medium  Result Value Ref Range Status   SARS Coronavirus 2 by RT PCR POSITIVE (A) NEGATIVE Final    Comment: RESULT CALLED TO, READ BACK BY AND VERIFIED WITH: MIMI FAGGART RN [id number] 06/25/20 HNM (NOTE) SARS-CoV-2 target nucleic acids are DETECTED.  The SARS-CoV-2 RNA is generally detectable in upper respiratory specimens during the acute phase of infection. Positive results are indicative of the presence of the identified virus, but do not rule out bacterial infection or co-infection with other pathogens not detected by the test. Clinical  correlation with patient history and other diagnostic information is necessary to determine patient infection status. The expected result is Negative.  Fact Sheet for Patients: EntrepreneurPulse.com.au  Fact Sheet for Healthcare Providers: IncredibleEmployment.be  This test is not yet approved or cleared by the [place] FDA and  has been authorized for detection and/or diagnosis of SARS-CoV-2 by FDA under an Emergency Use Authorization (EUA).  This EUA will remain in effect (meaning this test can be  used) for the duration of  the COVID-19 declaration under Section 564(b)(1) of the Act, 21 U.S.C. section 360bbb-3(b)(1), unless the authorization is terminated or  revoked sooner.     Influenza A by PCR NEGATIVE NEGATIVE Final   Influenza B by PCR NEGATIVE NEGATIVE Final    Comment: (NOTE) The Xpert Xpress SARS-CoV-2/FLU/RSV plus assay is intended as an aid in the diagnosis of influenza from Nasopharyngeal swab specimens and should not be used as a sole basis for treatment. Nasal washings and aspirates are unacceptable for Xpert Xpress SARS-CoV-2/FLU/RSV testing.  Fact Sheet for Patients: EntrepreneurPulse.com.au  Fact Sheet for Healthcare Providers: IncredibleEmployment.be  This test is not yet approved or cleared by the [place] FDA and has been authorized for detection and/or diagnosis of SARS-CoV-2 by FDA under an Emergency Use Authorization (EUA). This EUA will remain in effect (meaning this test can be used) for the duration of the COVID-19 declaration under Section 564(b)(1) of the Act, 21 U.S.C. section 360bbb-3(b)(1), unless the authorization is terminated or revoked.  Performed at [facility], [address], [place], [place] [address]          Radiology Studies: No results found.      Scheduled Meds: . amLODipine  5 mg Oral Daily  . vitamin C  500 mg Oral Daily   . aspirin  81 mg Oral QPM  . atorvastatin  40 mg Oral QPM  . cholecalciferol  1,000 Units Oral Daily  . enoxaparin (LOVENOX) injection  0.5 mg/kg Subcutaneous Q24H  . famotidine  20 mg Oral BID  . glimepiride  2 mg Oral BID  . guaiFENesin  600 mg Oral BID  . insulin aspart  0-15 Units Subcutaneous TID AC & HS  . insulin aspart  3 Units Subcutaneous TID WC  . insulin glargine  5 Units Subcutaneous BID  . losartan  50 mg Oral Daily  . magnesium oxide  400 mg Oral BID  . methylPREDNISolone (SOLU-MEDROL) injection  0.5 mg/kg Intravenous Q12H   Followed by  . [START ON 06/29/2020] predniSONE  50 mg Oral Daily  . propranolol  10 mg Oral BID  . venlafaxine XR  150 mg Oral Daily  . venlafaxine XR  37.5 mg Oral Daily  . vitamin B-12  1,000 mcg Oral Daily  . zinc sulfate  220 mg Oral Daily   Continuous Infusions: . remdesivir 100 mg in NS 100 mL Stopped (06/27/20 0935)    Assessment & Plan:   Active Problems:   Acute hypoxemic respiratory failure due to COVID-19 [facility])   Sepsis due to urinary tract infection ([place])     1.  Acute hypoxemic respiratory failure due to COVID-19. Clinically improving, will wean down and off 02 if tolerated Will need to get ambulatory 02. Pt not on 02 at home Continue Remdesivir and steroids Continue vit c , d2, zn Inflammatory markers and D dimers tredning down Hold abx procal <0.1   2.  Essential hypertension. Stable Continue norvasc, cozaar, inderal     3.  Type 2 diabetes mellitus. A1c 7.4 BG elevated riss Increase lantus to 10units bid Add novolog 6 tid with meals   4.  Depression and anxiety. Continue Effexor xr and ativan    5.  Vitamin B12 deficiency.  continue vitamin B12.  6.  GERD.  continue PPI therapy.     DVT prophylaxis: Lovenox Code Status: DNR Family Communication: none at bedside  Status is: Inpatient  Remains inpatient appropriate because:Inpatient level of care appropriate due to severity of  illness   Dispo: The patient is from: Home  Anticipated d/c is to: Home              Patient currently is not medically stable to d/c.   Difficult to place patient No            LOS: 3 days  Time spent: 35 minutes with more than 50% on [place], MD [facility] Pager [phone]  If 7PM-7AM, please contact night-coverage 06/28/2020, 9:10 AM

## 2020-06-29 ENCOUNTER — Inpatient Hospital Stay (HOSPITAL_COMMUNITY)
Admit: 2020-06-29 | Discharge: 2020-06-29 | Disposition: A | Discharge status: Home / Self Care | Payer: Medicare HMO | Attending: Internal Medicine | Admitting: Internal Medicine

## 2020-06-29 ENCOUNTER — Encounter: Payer: Self-pay | Admitting: Family Medicine

## 2020-06-29 DIAGNOSIS — J9601 Acute respiratory failure with hypoxia: Secondary | ICD-10-CM

## 2020-06-29 DIAGNOSIS — J432 Centrilobular emphysema: Secondary | ICD-10-CM

## 2020-06-29 DIAGNOSIS — I10 Essential (primary) hypertension: Secondary | ICD-10-CM | POA: Diagnosis not present

## 2020-06-29 DIAGNOSIS — I480 Paroxysmal atrial fibrillation: Secondary | ICD-10-CM

## 2020-06-29 DIAGNOSIS — E876 Hypokalemia: Secondary | ICD-10-CM | POA: Diagnosis not present

## 2020-06-29 DIAGNOSIS — E538 Deficiency of other specified B group vitamins: Secondary | ICD-10-CM

## 2020-06-29 DIAGNOSIS — U071 COVID-19: Secondary | ICD-10-CM | POA: Diagnosis not present

## 2020-06-29 DIAGNOSIS — I4891 Unspecified atrial fibrillation: Secondary | ICD-10-CM

## 2020-06-29 HISTORY — DX: Unspecified atrial fibrillation: I48.91

## 2020-06-29 LAB — C-REACTIVE PROTEIN: CRP: 1.9 mg/dL — ABNORMAL HIGH (ref <1.0)

## 2020-06-29 LAB — COMPREHENSIVE METABOLIC PANEL
ALT: 19 U/L (ref 0–44)
AST: 20 U/L (ref 15–41)
Albumin: 3.6 g/dL (ref 3.5–5.0)
Alkaline Phosphatase: 76 U/L (ref 38–126)
Anion gap: 11 (ref 5–15)
BUN: 23 mg/dL (ref 8–23)
CO2: 27 mmol/L (ref 22–32)
Calcium: 8.7 mg/dL — ABNORMAL LOW (ref 8.9–10.3)
Chloride: 99 mmol/L (ref 98–111)
Creatinine, Ser: 0.77 mg/dL (ref 0.44–1.00)
GFR, Estimated: >60 mL/min (ref >60)
Glucose, Bld: 74 mg/dL (ref 70–99)
Potassium: 3 mmol/L — ABNORMAL LOW (ref 3.5–5.1)
Sodium: 137 mmol/L (ref 135–145)
Total Bilirubin: 0.6 mg/dL (ref 0.3–1.2)
Total Protein: 7.5 g/dL (ref 6.5–8.1)

## 2020-06-29 LAB — CBC WITH DIFFERENTIAL/PLATELET
Abs Immature Granulocytes: 0.07 10*3/uL (ref 0.00–0.07)
Basophils Absolute: 0 10*3/uL (ref 0.0–0.1)
Basophils Relative: 0 %
Eosinophils Absolute: 0 10*3/uL (ref 0.0–0.5)
Eosinophils Relative: 0 %
HCT: 37.6 % (ref 36.0–46.0)
Hemoglobin: 13.1 g/dL (ref 12.0–15.0)
Immature Granulocytes: 1 %
Lymphocytes Relative: 31 %
Lymphs Abs: 3.6 10*3/uL (ref 0.7–4.0)
MCH: 31.8 pg (ref 26.0–34.0)
MCHC: 34.8 g/dL (ref 30.0–36.0)
MCV: 91.3 fL (ref 80.0–100.0)
Monocytes Absolute: 1.3 10*3/uL — ABNORMAL HIGH (ref 0.1–1.0)
Monocytes Relative: 11 %
Neutro Abs: 6.7 10*3/uL (ref 1.7–7.7)
Neutrophils Relative %: 57 %
Platelets: 427 10*3/uL — ABNORMAL HIGH (ref 150–400)
RBC: 4.12 MIL/uL (ref 3.87–5.11)
RDW: 12.2 % (ref 11.5–15.5)
WBC: 11.7 10*3/uL — ABNORMAL HIGH (ref 4.0–10.5)
nRBC: 0 % (ref 0.0–0.2)

## 2020-06-29 LAB — LACTIC ACID, PLASMA: Lactic Acid, Venous: 1.4 mmol/L (ref 0.5–1.9)

## 2020-06-29 LAB — GLUCOSE, CAPILLARY
Glucose-Capillary: 72 mg/dL (ref 70–99)
Glucose-Capillary: 138 mg/dL — ABNORMAL HIGH (ref 70–99)
Glucose-Capillary: 292 mg/dL — ABNORMAL HIGH (ref 70–99)
Glucose-Capillary: 254 mg/dL — ABNORMAL HIGH (ref 70–99)

## 2020-06-29 LAB — MAGNESIUM: Magnesium: 1.4 mg/dL — ABNORMAL LOW (ref 1.7–2.4)

## 2020-06-29 LAB — ECHOCARDIOGRAM COMPLETE
AR max vel: 2.28 cm2
AV Area VTI: 2.79 cm2
AV Area mean vel: 2.16 cm2
AV Mean grad: 3 mmHg
AV Peak grad: 4.5 mmHg
Ao pk vel: 1.06 m/s
Area-P 1/2: 2.45 cm2
Height: 63.5 in
S' Lateral: 3.26 cm
Weight: 3360 oz

## 2020-06-29 LAB — FERRITIN: Ferritin: 128 ng/mL (ref 11–307)

## 2020-06-29 LAB — D-DIMER, QUANTITATIVE: D-Dimer, Quant: 0.73 ug/mL-FEU — ABNORMAL HIGH (ref 0.00–0.50)

## 2020-06-29 LAB — BRAIN NATRIURETIC PEPTIDE: B Natriuretic Peptide: 270 pg/mL — ABNORMAL HIGH (ref 0.0–100.0)

## 2020-06-29 MED ORDER — MAGNESIUM SULFATE 2 GM/50ML IV SOLN: 2.0000 g | Freq: Once | INTRAVENOUS | Status: DC

## 2020-06-29 MED ORDER — SODIUM CHLORIDE 0.9 % IV SOLN
100.0000 mg | Freq: Every day | INTRAVENOUS | Status: DC
  Administered 2020-06-30: 100 mg via INTRAVENOUS
  Filled 2020-06-29 (×2): qty 20

## 2020-06-29 MED ORDER — POTASSIUM CHLORIDE 10 MEQ/100ML IV SOLN
10.0000 meq | INTRAVENOUS | Status: DC
  Administered 2020-06-29 (×2): 10 meq via INTRAVENOUS
  Filled 2020-06-29: qty 100

## 2020-06-29 MED ORDER — POTASSIUM CHLORIDE CRYS ER 20 MEQ PO TBCR
40.0000 meq | EXTENDED_RELEASE_TABLET | Freq: Once | ORAL | Status: AC
  Administered 2020-06-29: 40 meq via ORAL
  Filled 2020-06-29: qty 2

## 2020-06-29 MED ORDER — FUROSEMIDE 10 MG/ML IJ SOLN
20.0000 mg | Freq: Once | INTRAMUSCULAR | Status: AC
  Administered 2020-06-29: 20 mg via INTRAVENOUS
  Filled 2020-06-29: qty 4

## 2020-06-29 MED ORDER — DILTIAZEM HCL ER COATED BEADS 120 MG PO CP24
120.0000 mg | ORAL_CAPSULE | Freq: Every day | ORAL | Status: DC
  Administered 2020-06-29 – 2020-06-30 (×2): 120 mg via ORAL
  Filled 2020-06-29 (×2): qty 1

## 2020-06-29 MED ORDER — POTASSIUM CHLORIDE CRYS ER 20 MEQ PO TBCR
40.0000 meq | EXTENDED_RELEASE_TABLET | Freq: Two times a day (BID) | ORAL | Status: DC
  Administered 2020-06-29 – 2020-06-30 (×3): 40 meq via ORAL
  Filled 2020-06-29 (×3): qty 2

## 2020-06-29 MED ORDER — APIXABAN 5 MG PO TABS
5.0000 mg | ORAL_TABLET | Freq: Two times a day (BID) | ORAL | Status: DC
  Administered 2020-06-29 – 2020-06-30 (×3): 5 mg via ORAL
  Filled 2020-06-29 (×3): qty 1

## 2020-06-29 MED ORDER — INSULIN GLARGINE 100 UNIT/ML ~~LOC~~ SOLN
7.0000 [IU] | Freq: Two times a day (BID) | SUBCUTANEOUS | Status: DC
  Administered 2020-06-29 – 2020-06-30 (×3): 7 [IU] via SUBCUTANEOUS
  Filled 2020-06-29 (×5): qty 0.07

## 2020-06-29 NOTE — Progress Notes (Signed)
*  PRELIMINARY RESULTS* Echocardiogram 2D Echocardiogram has been performed.  Sherrie Sport 06/29/2020, 2:23 PM

## 2020-06-29 NOTE — Progress Notes (Signed)
PROGRESS NOTE    DESIRE FULP  UTM:[id number] DOB: Jul 22, 1945 DOA: 06/25/2020 PCP: Idelle Crouch, MD    Brief Narrative:  Kayla Wu is a 75 y.o. Caucasian female with medical history significant for COPD, diabetes mellitus, hypertension, GERD and degenerative disc disease, who presented to the ER with acute onset of feeling sick over the last week with associated dyspnea and cough productive of yellowish sputum as well as wheezing and generalized weakness with body aches.  She admits to bad headaches without paresthesias or focal muscle weakness.  No nausea or vomiting or diarrhea.  She admitted to nasal and sinus congestion.  She had mildly diminished sense of taste and smell.  She had a home COVID test that was positive several days ago.  She also had a confirmatory PCR.  No dysuria, oliguria or hematuria or flank pain.  She denies any abdominal pain or bleeding diathesis.  She was seen [facility] clinic earlier today and was hypoxemic with a pulse ox into the 80s. ED Course: Upon presentation to the emergency room blood pressure was 145/72 with respiratory to 22 and her pulse oximetry was 99% on 2 L O2 by nasal cannula COVID-19 PCR came back positive and influenza antigens were negative  5/25- pt feeling little better, still with sob. No cp.  5/26-feels congested in the face. On 2L 02. Feels sob improving, but not sure if better with ambulation 5/27 patient went into A. fib RVR this a.m. with heart rate in the 150s.  Her oxygenation had dropped to 52 and bumped back quickly to the 90s percent.  Patient denies shortness of breath on 2 L currently laying in bed.  She is emotional that she cannot go home today.  Denies chest pain or dizziness.cardiology was consulted   Consultants:   Cardiology   Procedures:   Antimicrobials:   remdesivir    Subjective: As above. Some cough, no cp  Objective: Vitals:   06/28/20 1601 06/28/20 2026 06/28/20 2359 06/29/20 0632  BP: (!)  141/61 (!) 156/63 (!) 144/61 (!) 144/80  Pulse: 66 65 (!) 58 64  Resp: 18 20 15 16   Temp: 97.9 F (36.6 C) 98.3 F (36.8 C) 98.3 F (36.8 C) 98.3 F (36.8 C)  TempSrc: Oral Oral Oral Oral  SpO2: 95% 94% 95% 94%  Weight:      Height:       No intake or output data in the 24 hours ending 06/29/20 1525 Filed Weights   06/25/20 1611  Weight: 95.3 kg    Examination: Mildly emotional and tearful, comfortable Decreased breath sounds at bases no wheezing scattered crackles Regular S1-S2 no gallops Soft benign positive bowel sounds No edema Awake and alert, grossly intact Mood and affect appropriate in current setting  Data Reviewed: I have personally reviewed following labs and imaging studies  CBC: Recent Labs  Lab 06/25/20 1619 06/26/20 0544 06/27/20 0441 06/28/20 0523 06/29/20 0717  WBC 8.1 5.7 12.2* 11.9* 11.7*  NEUTROABS  --  4.6 10.2* 10.1* 6.7  HGB 12.5 12.3 11.6* 11.2* 13.1  HCT 37.8 35.7* 32.6* 33.0* 37.6  MCV 95.9 94.2 92.1 94.8 91.3  PLT 287 266 304 332 568*   Basic Metabolic Panel: Recent Labs  Lab 06/25/20 1619 06/26/20 0544 06/27/20 0441 06/28/20 0523 06/29/20 0717  NA 134* 135 139 139 137  K 4.2 4.6 4.2 4.3 3.0*  CL 96* 98 105 103 99  CO2 26 24 23 25 27   GLUCOSE 178* 323* 202* 239* 74  BUN 27* 32* 35* 26* 23  CREATININE 0.90 0.94 0.86 0.85 0.77  CALCIUM 8.6* 7.9* 8.0* 8.4* 8.7*  MG  --   --   --   --  1.4*   GFR: Estimated Creatinine Clearance: 67.4 mL/min (by C-G formula based on SCr of 0.77 mg/dL). Liver Function Tests: Recent Labs  Lab 06/26/20 0544 06/27/20 0441 06/28/20 0523 06/29/20 0717  AST 15 24 21 20   ALT 16 18 18 19   ALKPHOS 77 70 69 76  BILITOT 0.5 0.3 0.4 0.6  PROT 7.5 7.3 6.7 7.5  ALBUMIN 3.7 3.4* 3.2* 3.6   No results for input(s): LIPASE, AMYLASE in the last 168 hours. No results for input(s): AMMONIA in the last 168 hours. Coagulation Profile: No results for input(s): INR, PROTIME in the last 168 hours. Cardiac  Enzymes: No results for input(s): CKTOTAL, CKMB, CKMBINDEX, TROPONINI in the last 168 hours. BNP (last 3 results) No results for input(s): PROBNP in the last 8760 hours. HbA1C: No results for input(s): HGBA1C in the last 72 hours. CBG: Recent Labs  Lab 06/28/20 1104 06/28/20 1556 06/28/20 2028 06/29/20 0821 06/29/20 1253  GLUCAP 329* 331* 309* 72 138*   Lipid Profile: No results for input(s): CHOL, HDL, LDLCALC, TRIG, CHOLHDL, LDLDIRECT in the last 72 hours. Thyroid Function Tests: No results for input(s): TSH, T4TOTAL, FREET4, T3FREE, THYROIDAB in the last 72 hours. Anemia Panel: Recent Labs    06/28/20 0523 06/29/20 0717  FERRITIN 112 128   Sepsis Labs: Recent Labs  Lab 06/25/20 2230 06/27/20 0955 06/28/20 0925 06/29/20 0717  PROCALCITON <0.10  --   --   --   LATICACIDVEN 2.3* 2.3* 4.4* 1.4    Recent Results (from the past 240 hour(s))  Blood Culture (routine x 2)     Status: None (Preliminary result)   Collection Time: 06/25/20 10:20 PM   Specimen: BLOOD  Result Value Ref Range Status   Specimen Description BLOOD BLOOD RIGHT WRIST  Final   Special Requests   Final    BOTTLES DRAWN AEROBIC AND ANAEROBIC Blood Culture adequate volume   Culture   Final    NO GROWTH 4 DAYS Performed at [facility], [address] [place], [place] [address]    Report Status PENDING  Incomplete  Blood Culture (routine x 2)     Status: None (Preliminary result)   Collection Time: 06/25/20 10:30 PM   Specimen: BLOOD  Result Value Ref Range Status   Specimen Description BLOOD BLOOD LEFT HAND  Final   Special Requests   Final    BOTTLES DRAWN AEROBIC AND ANAEROBIC Blood Culture adequate volume   Culture   Final    NO GROWTH 4 DAYS Performed at [facility], [address], [place], [place] [address]    Report Status PENDING  Incomplete  Resp Panel by RT-PCR (Flu A&B, Covid) Nasopharyngeal Swab     Status: Abnormal   Collection Time: 06/25/20 10:30 PM    Specimen: Nasopharyngeal Swab; Nasopharyngeal(NP) swabs in vial transport medium  Result Value Ref Range Status   SARS Coronavirus 2 by RT PCR POSITIVE (A) NEGATIVE Final    Comment: RESULT CALLED TO, READ BACK BY AND VERIFIED WITH: MIMI FAGGART RN 2348 06/25/20 HNM (NOTE) SARS-CoV-2 target nucleic acids are DETECTED.  The SARS-CoV-2 RNA is generally detectable in upper respiratory specimens during the acute phase of infection. Positive results are indicative of the presence of the identified virus, but do not rule out bacterial infection or co-infection with  other pathogens not detected by the test. Clinical correlation with patient history and other diagnostic information is necessary to determine patient infection status. The expected result is Negative.  Fact Sheet for Patients: EntrepreneurPulse.com.au  Fact Sheet for Healthcare Providers: IncredibleEmployment.be  This test is not yet approved or cleared by the [place] FDA and  has been authorized for detection and/or diagnosis of SARS-CoV-2 by FDA under an Emergency Use Authorization (EUA).  This EUA will remain in effect (meaning this test can be  used) for the duration of  the COVID-19 declaration under Section 564(b)(1) of the Act, 21 U.S.C. section 360bbb-3(b)(1), unless the authorization is terminated or revoked sooner.     Influenza A by PCR NEGATIVE NEGATIVE Final   Influenza B by PCR NEGATIVE NEGATIVE Final    Comment: (NOTE) The Xpert Xpress SARS-CoV-2/FLU/RSV plus assay is intended as an aid in the diagnosis of influenza from Nasopharyngeal swab specimens and should not be used as a sole basis for treatment. Nasal washings and aspirates are unacceptable for Xpert Xpress SARS-CoV-2/FLU/RSV testing.  Fact Sheet for Patients: EntrepreneurPulse.com.au  Fact Sheet for Healthcare Providers: IncredibleEmployment.be  This test is not yet  approved or cleared by the [place] FDA and has been authorized for detection and/or diagnosis of SARS-CoV-2 by FDA under an Emergency Use Authorization (EUA). This EUA will remain in effect (meaning this test can be used) for the duration of the COVID-19 declaration under Section 564(b)(1) of the Act, 21 U.S.C. section 360bbb-3(b)(1), unless the authorization is terminated or revoked.  Performed at [facility], [address], [place], [place] [address]          Radiology Studies: No results found.      Scheduled Meds: . apixaban  5 mg Oral BID  . vitamin C  500 mg Oral Daily  . atorvastatin  40 mg Oral QPM  . cholecalciferol  1,000 Units Oral Daily  . diltiazem  120 mg Oral Daily  . famotidine  20 mg Oral BID  . furosemide  20 mg Intravenous Once  . glimepiride  2 mg Oral BID  . guaiFENesin  600 mg Oral BID  . insulin aspart  0-15 Units Subcutaneous TID AC & HS  . insulin aspart  6 Units Subcutaneous TID WC  . insulin glargine  7 Units Subcutaneous BID  . losartan  50 mg Oral Daily  . magnesium oxide  400 mg Oral BID  . potassium chloride  40 mEq Oral BID  . predniSONE  50 mg Oral Daily  . propranolol  10 mg Oral BID  . venlafaxine XR  150 mg Oral Daily  . venlafaxine XR  37.5 mg Oral Daily  . vitamin B-12  1,000 mcg Oral Daily  . zinc sulfate  220 mg Oral Daily   Continuous Infusions:   Assessment & Plan:   Active Problems:   Acute hypoxemic respiratory failure due to COVID-19 [facility])   Sepsis due to urinary tract infection ([place])     1.  Acute hypoxemic respiratory failure due to COVID-19. Still requiring 2L 02, may need to go home with 02 if cant wean off Continue remdesivir and steorid Give lasix 20mg  iv x1 today, may need to repeat . Keep her on dry side with covid Continue vic , D, Zn Inflammatory markers, D-dimers are trending down Holding antibiotics since procalcitonin less than 0.1 I-S, flutter valves   2. PAFwith  rvr-this am.  In setting of hypoxia and respiratory failure/covid, and hypokalmia Cardiology input  appreciated Started on Eliquis due to chadvasc 6 D/c amlodipine Started on cardizem 120mg  qd. Dc asa. Echo penidng May need event monitor and f/u with cards as outpt.     3. Hypokalemia K 3.0 Will supplement Ck mag Monitor levels  4.  Essential hypertension. Stable Continue Cozaar, Inderal DC Norvasc and switch to Cardizem as above      5.  Type 2 diabetes mellitus. A1c 7.4 BG improved  Lantus decreased to 7 units twice daily from 10 units as her a.m. BG was low normal and to avoid hypoglycemia Continue novolog 6 tid with meals   4.  Depression and anxiety. Continue Effexor XR and Ativan    5.  Vitamin B12 deficiency. Continue vitamin B12  6.  GERD. Continue PPI     DVT prophylaxis: Eliquis Code Status: DNR Family Communication: Left voicemail for husband  Status is: Inpatient  Remains inpatient appropriate because:Inpatient level of care appropriate due to severity of illness   Dispo: The patient is from: Home              Anticipated d/c is to: Home              Patient currently is not medically stable to d/c.   Difficult to place patient No            LOS: 4 days  Time spent: 45 minutes with more than 50% on [place], MD [facility] Pager [phone]  If 7PM-7AM, please contact night-coverage 06/29/2020, 3:25 PM

## 2020-06-29 NOTE — Progress Notes (Signed)
Mobility Specialist - Progress Note   06/29/20 1400  Mobility  Activity Ambulated in room  Level of Assistance Modified independent, requires aide device or extra time  Assistive Device None  Distance Ambulated (ft) 100 ft  Mobility Ambulated independently in room  Mobility Response Tolerated well  Mobility performed by Mobility specialist  $Mobility charge 1 Mobility    Pre-mobility: 71 HR, 93% SpO2 During mobility: 79 HR, 86% SpO2 Post-mobility: 70 HR, 93% SpO2   Pt sitting EOB on arrival, utilizing RA. Family at bedside. Pt ambulated in room independently. No LOB. Denied SOB on RA. O2 desat to a low of 86% during ambulation. Pt sat EOB, utilizing PLB technique with no change in sats. No s/s of distress. Mobility reapplied [place] on 1L and sats immediately improved to 93%. Pt left EOB on RA with sats remaining at 93%.    Kathee Delton Mobility Specialist 06/29/20, 3:14 PM

## 2020-06-29 NOTE — TOC Progression Note (Signed)
Transition of Care [facility]) - Progression Note    Patient Details  Name: Kayla Wu MRN: [id number] Date of Birth: [birth year]  Transition of Care [facility]) CM/SW [place], RN Phone Number: 06/29/2020, 4:17 PM  Clinical Narrative: Oxygen Sats done, however Attending will reassess when medically stable for discharge. Mardene Celeste from [place] says Sats are only good for 48hrs.      Expected Discharge Plan: Home/Self Care Barriers to Discharge: Continued Medical Work up  Expected Discharge Plan and Services Expected Discharge Plan: Home/Self Care       Living arrangements for the past 2 months: Single Family Home                                       Social Determinants of Health (SDOH) Interventions    Readmission Risk Interventions Readmission Risk Prevention Plan 01/20/2020  Transportation Screening Complete  PCP or Specialist Appt within 5-7 Days Complete  Home Care Screening Complete  Medication Review (RN CM) Complete  Some recent data might be hidden

## 2020-06-29 NOTE — Care Management Important Message (Signed)
Important Message  Patient Details  Name: Kayla Wu MRN: [id number] Date of Birth: 01-03-1946   Medicare Important Message Given:  Yes  Patient is in an isolation room and I reviewed the Important Message from Medicare with her by phone [phone]).  She understood her right to appeal but felt certain it would not be necessary.  I asked if she would like a copy of the form and she replied no.  I wished her a speedy recovery and thanked her for her time.  [name] A Riker Collier 06/29/2020, 3:46 PM

## 2020-06-29 NOTE — Progress Notes (Signed)
Patient converted to afib rhythm this morning per telemetry tech. Notified Dr. Damita Dunnings, no new orders at this time. Patient is alert oriented and comfortable with no symptoms. Will continue to monitor.

## 2020-06-29 NOTE — Progress Notes (Signed)
PT Cancellation Note  Patient Details Name: Kayla Wu MRN: [id number] DOB: 01-06-1946   Cancelled Treatment:    Reason Eval/Treat Not Completed: Patient declined, no reason specified.  Pt just finished with mobilization specialist and fatigued from treatment.  Pt will be seen again as medically appropriate at later date/time.    Gwenlyn Saran, PT, DPT 06/29/20, 3:25 PM

## 2020-06-29 NOTE — Consult Note (Addendum)
Cardiology Consult    Patient ID: Kayla Wu MRN: [id number], DOB/AGE: 76-07-1945   Admit date: 06/25/2020 Date of Consult: 06/29/2020  Primary Physician: Idelle Crouch, MD Primary Cardiologist: Ida Rogue, MD - new Requesting Provider: S. Kurtis Bushman, MD  Patient Profile    Kayla Wu is a 75 y.o. female with a history of remote tob abuse (42 pack years), COPD, DM, HTN, HL, GERD, obesity, and ovarian cancer, who is being seen today for the evaluation of afib w/ RVR at the request of Dr. Kurtis Bushman.  Past Medical History   Past Medical History:  Diagnosis Date  . Chronic urticaria   . COPD (chronic obstructive pulmonary disease) ([place])   . COVID-19 virus infection 06/2020  . DDD (degenerative disc disease)   . Diabetes mellitus ([place])   . GERD (gastroesophageal reflux disease)   . History of echocardiogram    a. 01/2018 Echo: Nl EF. Triv AI/MR/PR. Mild TR.  Marland Kitchen Hypertension   . Menopausal state   . Morbid obesity ([place])   . Non-cardiac chest pain    a. 07/2014 MV: EF 75%, no ischemia/artifact.  . Ovarian cancer ([place]) 2012   s/p chemo   . Personal history of chemotherapy 2012   ovarian  . Pneumonia 1987    Past Surgical History:  Procedure Laterality Date  . APPENDECTOMY    . CHOLECYSTECTOMY    . FINGER ARTHROPLASTY Left 12/22/2014   Procedure: FINGER ARTHROPLASTY;  Surgeon: [name], MD;  Location: [facility] ORS;  Service: Orthopedics;  Laterality: Left;  . hysterectomy       Allergies  Allergies  Allergen Reactions  . Iodinated Diagnostic Agents Hives, Shortness Of Breath and Itching    Patient states she had itching, hives, and SOB after injection. She states she was given Benadryl before and after injection and still had reaction.    History of Present Illness     75 y.o. female with a history of remote tob abuse (42 pack years), COPD, DM, HTN, HL, GERD, obesity, and ovarian cancer.  She denies any prior cardiac hx, though notes indicate a h/o  noncardiac c/p and review of care everywhere shows that she had a nl stress test in 2016 w/ nl EF on echo in 2019.  She lives locally w/ her husband and does not routinely exercise.  She quit smoking about 8 yrs ago after smoking 2 ppd for the better part of 42 years.  She was in her USOH until about 10-12 days ago, when she began noting chest and sinus congestion assoc w/ gen malaise.  As she has chronic DOE, she did not notice any worsening from baseline.  She initially tested neg for COVID via a home test early last week, but by Saturday, she re-tested and it came up positive.  She then went to an urgent care and was re-tested, and this too, was positive.  She remained at home over the weekend w/ ongoing chest/sinus congestion and cough, and contacted her PCP's office on 5/23.  She was seen @ [place] walk-in [facility] and was noted to be hypoxic w/ sats in the high 80's.  She was placed on O2 and referred to the ED, where she again tested + for COVID.  She was admitted and placed on remdesivir, steroids, zinc, nebs, and Vit C.  She has noted improvement in resp status and general disposition daily.  This AM however, she was noted to go into AFib w/ RVR @ 0614.  Rates rose into the  160's.  She was completely asymptomatic.  W/o intervention, she converted to sinus rhythm w/ PACs @ 0919.  She currently has no complaints.    Inpatient Medications    . apixaban  5 mg Oral BID  . vitamin C  500 mg Oral Daily  . aspirin  81 mg Oral QPM  . atorvastatin  40 mg Oral QPM  . cholecalciferol  1,000 Units Oral Daily  . diltiazem  120 mg Oral Daily  . famotidine  20 mg Oral BID  . glimepiride  2 mg Oral BID  . guaiFENesin  600 mg Oral BID  . insulin aspart  0-15 Units Subcutaneous TID AC & HS  . insulin aspart  6 Units Subcutaneous TID WC  . insulin glargine  7 Units Subcutaneous BID  . losartan  50 mg Oral Daily  . magnesium oxide  400 mg Oral BID  . predniSONE  50 mg Oral Daily  . propranolol  10 mg Oral  BID  . venlafaxine XR  150 mg Oral Daily  . venlafaxine XR  37.5 mg Oral Daily  . vitamin B-12  1,000 mcg Oral Daily  . zinc sulfate  220 mg Oral Daily    Family History    Family History  Problem Relation Age of Onset  . Emphysema Father        smoked  . Breast cancer Mother 48  . Colon cancer Sister    She indicated that the status of her mother is unknown. She indicated that the status of her father is unknown. She indicated that the status of her sister is unknown.   Social History    Social History   Socioeconomic History  . Marital status: Married    Spouse name: Not on file  . Number of children: 3  . Years of education: Not on file  . Highest education level: Not on file  Occupational History  . Occupation: Retired  Tobacco Use  . Smoking status: Former Smoker    Packs/day: 2.00    Years: 42.00    Pack years: 84.00    Types: Cigarettes    Quit date: 07/05/2007    Years since quitting: 12.9  . Smokeless tobacco: Never Used  Substance and Sexual Activity  . Alcohol use: No    Comment: occasional glass of wine once a month  . Drug use: No  . Sexual activity: Not on file  Other Topics Concern  . Not on file  Social History Narrative   Lives locally w/ husband.  Does not routinely exercise.   Social Determinants of Health   Financial Resource Strain: Not on file  Food Insecurity: Not on file  Transportation Needs: Not on file  Physical Activity: Not on file  Stress: Not on file  Social Connections: Not on file  Intimate Partner Violence: Not on file     Review of Systems    General:  No chills, fever, night sweats or weight changes.  Cardiovascular:  No chest pain, +++ dyspnea on exertion, no edema, orthopnea, palpitations, paroxysmal nocturnal dyspnea. Dermatological: No rash, lesions/masses Respiratory: +++ cough, +++ dyspnea Urologic: No hematuria, dysuria Abdominal:   No nausea, vomiting, diarrhea, bright red blood per rectum, melena, or  hematemesis Neurologic:  No visual changes, wkns, changes in mental status. All other systems reviewed and are otherwise negative except as noted above.  Physical Exam    Blood pressure (!) 144/80, pulse 64, temperature 98.3 F (36.8 C), temperature source Oral, resp. rate 16, height  5' 3.5" (1.613 m), weight 95.3 kg, SpO2 94 %.  General: Pleasant, NAD Psych: Normal affect. Neuro: Alert and oriented X 3. Moves all extremities spontaneously. HEENT: Normal  Neck: Supple without bruits or JVD. Lungs:  Resp regular and unlabored, diminished breath sounds, but overall CTA. Heart: RRR, distant, no s3, s4, or murmurs. Abdomen: Obese, soft, non-tender, non-distended, BS + x 4.  Extremities: No clubbing, cyanosis or edema. DP/PT2+, Radials 2+ and equal bilaterally.  Labs     Lab Results  Component Value Date   WBC 11.7 (H) 06/29/2020   HGB 13.1 06/29/2020   HCT 37.6 06/29/2020   MCV 91.3 06/29/2020   PLT 427 (H) 06/29/2020    Recent Labs  Lab 06/29/20 0717  NA 137  K 3.0*  CL 99  CO2 27  BUN 23  CREATININE 0.77  CALCIUM 8.7*  PROT 7.5  BILITOT 0.6  ALKPHOS 76  ALT 19  AST 20  GLUCOSE 74   Lab Results  Component Value Date   TRIG 110 06/25/2020   Lab Results  Component Value Date   DDIMER 0.73 (H) 06/29/2020     Radiology Studies    DG Chest 2 View  Result Date: 06/25/2020 CLINICAL DATA:  75 year old female with shortness of breath. EXAM: CHEST - 2 VIEW COMPARISON:  Chest radiograph dated 01/20/2020 FINDINGS: Right-sided Port-A-Cath in similar position. There are bibasilar linear atelectasis. No focal consolidation, pleural effusion, or pneumothorax. Stable cardiac silhouette. No acute osseous pathology. Degenerative changes of the spine. IMPRESSION: No active cardiopulmonary disease. Electronically Signed   By: Anner Crete M.D.   On: 06/25/2020 17:20    ECG & Cardiac Imaging    No ECG performed during period of AFib.  STAT ECG ordered this AM @ 1031 - not  yet completed.  Tele showed sinus w/ PACS  rapid Afib from [phone]  0919  converted to sinus w/ PACs  Assessment & Plan    1.  PAF w/ RVR:  Pt admitted 5/23 in the setting of hypoxia/resp failure and COVID 19 infection.  She has felt well from a resp standpoint, but this AM went into rapid afib into the 160's. This persisted for 3 hrs, prior to breaking spontaneously.  She was asymptomatic.  She is on propranolol 10mg  daily @ baseline.  She is also on amlodipine 5mg  daily for HTN.  I've stopped amlodipine and replaced w/ dilt cd 120mg  daily.  She was hypokalemic this AM, and this has been supplemented.  TSH pending.  CHA2DS2VASc = 6, thus I've started eliquis 5mg  BID.  Discussed this w/ her and she is agreeable.  Will stop ASA.  Echo pending.  Given that she was asymptomatic, may need outpt event monitoring to determine burden and need for additional therapy.  2.  COVID-19 infection:  Feeling better.   Antiviral/steroids/nebs/vitamins per IM.  3.  Essential HTN:  BPs trending high.  Switching amlodipine to dilt CD in setting of #1.  Cont  blocker.  4.  DMII:  Per IM. A1c 7.4.  5.  HL:  On statin.  LDL 55 in Feb 2022.  6.  COPD:  See #2.  Signed, Murray Hodgkins, NP 06/29/2020, 1:50 PM  For questions or updates, please contact   Please consult [email or web address] for contact info under Cardiology/STEMI.

## 2020-06-29 NOTE — Consult Note (Signed)
Remdesivir - Pharmacy Brief Note   O:  ALT: 19 CXR (5/23): "No active cardiopulmonary disease" SpO2: Hypoxic requiring supplemental oxygen   A/P:  06/25/20 SARS-CoV-2 PCR (+)  Patient completed standard 5-day course of remdesivir today. Given lack of clinical improvement, extension requested per MD.  Remdesivir 100 mg IVPB daily x 5 more days to complete 10 days of therapy.  Benita Gutter 06/29/2020 3:42 PM

## 2020-06-30 DIAGNOSIS — I480 Paroxysmal atrial fibrillation: Secondary | ICD-10-CM | POA: Diagnosis not present

## 2020-06-30 DIAGNOSIS — J9601 Acute respiratory failure with hypoxia: Secondary | ICD-10-CM | POA: Diagnosis not present

## 2020-06-30 DIAGNOSIS — I10 Essential (primary) hypertension: Secondary | ICD-10-CM | POA: Diagnosis not present

## 2020-06-30 DIAGNOSIS — U071 COVID-19: Secondary | ICD-10-CM | POA: Diagnosis not present

## 2020-06-30 LAB — CBC WITH DIFFERENTIAL/PLATELET
Abs Immature Granulocytes: 0.1 10*3/uL — ABNORMAL HIGH (ref 0.00–0.07)
Basophils Absolute: 0 10*3/uL (ref 0.0–0.1)
Basophils Relative: 0 %
Eosinophils Absolute: 0 10*3/uL (ref 0.0–0.5)
Eosinophils Relative: 0 %
HCT: 38.9 % (ref 36.0–46.0)
Hemoglobin: 13.2 g/dL (ref 12.0–15.0)
Immature Granulocytes: 1 %
Lymphocytes Relative: 12 %
Lymphs Abs: 2 10*3/uL (ref 0.7–4.0)
MCH: 31.9 pg (ref 26.0–34.0)
MCHC: 33.9 g/dL (ref 30.0–36.0)
MCV: 94 fL (ref 80.0–100.0)
Monocytes Absolute: 0.6 10*3/uL (ref 0.1–1.0)
Monocytes Relative: 4 %
Neutro Abs: 14.2 10*3/uL — ABNORMAL HIGH (ref 1.7–7.7)
Neutrophils Relative %: 83 %
Platelets: 434 10*3/uL — ABNORMAL HIGH (ref 150–400)
RBC: 4.14 MIL/uL (ref 3.87–5.11)
RDW: 12.4 % (ref 11.5–15.5)
WBC: 17 10*3/uL — ABNORMAL HIGH (ref 4.0–10.5)
nRBC: 0 % (ref 0.0–0.2)

## 2020-06-30 LAB — CULTURE, BLOOD (ROUTINE X 2)
Culture: NO GROWTH
Culture: NO GROWTH
Special Requests: ADEQUATE
Special Requests: ADEQUATE

## 2020-06-30 LAB — BASIC METABOLIC PANEL
Anion gap: 11 (ref 5–15)
BUN: 39 mg/dL — ABNORMAL HIGH (ref 8–23)
CO2: 27 mmol/L (ref 22–32)
Calcium: 8.9 mg/dL (ref 8.9–10.3)
Chloride: 103 mmol/L (ref 98–111)
Creatinine, Ser: 0.96 mg/dL (ref 0.44–1.00)
GFR, Estimated: >60 mL/min (ref >60)
Glucose, Bld: 77 mg/dL (ref 70–99)
Potassium: 4.5 mmol/L (ref 3.5–5.1)
Sodium: 141 mmol/L (ref 135–145)

## 2020-06-30 LAB — GLUCOSE, CAPILLARY
Glucose-Capillary: 84 mg/dL (ref 70–99)
Glucose-Capillary: 146 mg/dL — ABNORMAL HIGH (ref 70–99)

## 2020-06-30 LAB — TSH: TSH: 1.883 u[IU]/mL (ref 0.350–4.500)

## 2020-06-30 LAB — C-REACTIVE PROTEIN: CRP: 1 mg/dL — ABNORMAL HIGH (ref <1.0)

## 2020-06-30 LAB — MAGNESIUM: Magnesium: 1.4 mg/dL — ABNORMAL LOW (ref 1.7–2.4)

## 2020-06-30 LAB — FERRITIN: Ferritin: 123 ng/mL (ref 11–307)

## 2020-06-30 LAB — D-DIMER, QUANTITATIVE: D-Dimer, Quant: 0.74 ug/mL-FEU — ABNORMAL HIGH (ref 0.00–0.50)

## 2020-06-30 MED ORDER — PREDNISONE 20 MG PO TABS: 20.0000 mg | ORAL_TABLET | Freq: Every day | ORAL | 0 refills | Status: AC

## 2020-06-30 MED ORDER — DILTIAZEM HCL ER COATED BEADS 120 MG PO CP24: 120.0000 mg | ORAL_CAPSULE | Freq: Every day | ORAL | 0 refills | Status: DC

## 2020-06-30 MED ORDER — FUROSEMIDE 10 MG/ML IJ SOLN
20.0000 mg | Freq: Once | INTRAMUSCULAR | Status: AC
  Administered 2020-06-30: 20 mg via INTRAVENOUS
  Filled 2020-06-30: qty 4

## 2020-06-30 MED ORDER — APIXABAN 5 MG PO TABS: 5.0000 mg | ORAL_TABLET | Freq: Two times a day (BID) | ORAL | 0 refills | Status: DC

## 2020-06-30 MED ORDER — LOSARTAN POTASSIUM 50 MG PO TABS: 50.0000 mg | ORAL_TABLET | Freq: Every day | ORAL | 0 refills | Status: DC

## 2020-06-30 MED ORDER — GUAIFENESIN ER 600 MG PO TB12: 600.0000 mg | ORAL_TABLET | Freq: Two times a day (BID) | ORAL | 0 refills | Status: AC

## 2020-06-30 MED ORDER — VITAMIN D3 25 MCG PO TABS: 1000.0000 [IU] | ORAL_TABLET | Freq: Every day | ORAL | 0 refills | Status: AC

## 2020-06-30 MED ORDER — ZINC SULFATE 220 (50 ZN) MG PO CAPS: 220.0000 mg | ORAL_CAPSULE | Freq: Every day | ORAL | 0 refills | Status: DC

## 2020-06-30 MED ORDER — ASCORBIC ACID 500 MG PO TABS: 500.0000 mg | ORAL_TABLET | Freq: Every day | ORAL | 0 refills | Status: AC

## 2020-06-30 NOTE — Plan of Care (Signed)
  Problem: Clinical Measurements: Goal: Respiratory complications will improve Outcome: Progressing   Problem: Activity: Goal: Risk for activity intolerance will decrease Outcome: Progressing   Problem: Safety: Goal: Ability to remain free from injury will improve Outcome: Progressing   

## 2020-06-30 NOTE — Plan of Care (Signed)
  Problem: Clinical Measurements: Goal: Respiratory complications will improve 06/30/2020 1501 by Vivien Rota, RN Outcome: Adequate for Discharge 06/30/2020 1454 by Vivien Rota, RN Outcome: Progressing   Problem: Activity: Goal: Risk for activity intolerance will decrease 06/30/2020 1501 by Vivien Rota, RN Outcome: Adequate for Discharge 06/30/2020 1454 by Vivien Rota, RN Outcome: Progressing   Problem: Safety: Goal: Ability to remain free from injury will improve 06/30/2020 1501 by Vivien Rota, RN Outcome: Adequate for Discharge 06/30/2020 1454 by Vivien Rota, RN Outcome: Progressing

## 2020-06-30 NOTE — Progress Notes (Signed)
Discharge instructions reviewed.  Verbalized understanding

## 2020-06-30 NOTE — Progress Notes (Signed)
Mobility Specialist - Progress Note   06/30/20 1000  Mobility  Activity Refused mobility  Mobility performed by Mobility specialist    Pt politely declined mobility efforts, no reason specified. Pt teary-eyed, wants to go home---upset with level of care during stay. Lack of motivation this date, "what's the point if they're just going to keep me here?" Active listening and encouragement provided. Will monitor and attempt session another date/time as pt is agreeable.    Kathee Delton Mobility Specialist 06/30/20, 11:03 AM

## 2020-06-30 NOTE — Progress Notes (Addendum)
Mobility Specialist - Progress Note   06/30/20 1400  Mobility  Activity Ambulated in room  Level of Assistance Independent  Assistive Device None  Distance Ambulated (ft) 120 ft  Mobility Ambulated independently in room  Mobility Response Tolerated well  Mobility performed by Mobility specialist  $Mobility charge 1 Mobility    O2 while resting on RA = 98% O2 while AMB on RA = 90% O2 after activity on RA = 92%   Pt sleeping on arrival, awakens to voice. Pt ambulated in room independently. No LOB. Denied SOB throughout session, O2 desat to a low of 89% during last 20'. 1 short seated rest break taken d/t fatigue. Denies pain and nausea. HR 65-75 bpm. Pt left in bed on RA with sats at 92%. RN notified of performance.    Kathee Delton Mobility Specialist 06/30/20, 2:43 PM

## 2020-06-30 NOTE — Discharge Summary (Signed)
Kayla Wu ZJI:[id number] DOB: 02/05/1945 DOA: 06/25/2020  PCP: Kayla Crouch, MD  Admit date: 06/25/2020 Discharge date: 06/30/2020  Admitted From: home Disposition:  home  Recommendations for Outpatient Follow-up:  1. Follow up with PCP in 1 week 2. Please obtain BMP/CBC in one week 3. Cardiology Dr. Rockey Wu 2 weeks     Discharge Condition:Stable CODE STATUS: DNR Diet recommendation: Heart Healthy / Carb Modified  Brief/Interim Summary: Per Kayla R Manningis a 75 y.o.Caucasian femalewith medical history significant forCOPD, diabetes mellitus, hypertension, GERD and degenerative disc disease, who presented to the ER with acute onset of feeling sick over the last week with associated dyspnea and cough productive of yellowish sputum as well as wheezing and generalized weakness with body aches. She admited to bad headaches without paresthesias or focal muscle weakness.She admitted to nasal and sinus congestion. She had mildly diminished sense of taste and smell. She had a home COVID test that was positive several days ago. She also had a confirmatory PCR She was seen [facility] earlier today and was hypoxemic with a pulse ox into the 80s. ED Course:Upon presentation to the emergency room blood pressure was 145/72 with respiratory to 22 and her pulse oximetry was 99% on 2 L O2 by nasal cannula COVID-19 PCR came back positive and influenza antigens were negative Patient was admitted to the hospital for COVID-19 infection treatment protocol with IV remdesivir and steroids.  During her hospitalization she went into atrial fibrillation with RVR.  Cardiology was consulted.  She was started on Cardizem and Eliquis.  Currently she is in sinus rhythm.  Asymptomatic.  Today on room air she ambulated to 90% to 92%.  1. Acute hypoxemic respiratory failure due to COVID-19. Inflammatory markers and D-dimers trended down Received IV remdesivir Was started on IV steroids now  with p.o. prednisone taper Continue vitamin C, zinc, vitamin D On room air with ambulation does not require home O2   2. PAF with rvr- In setting of hypoxia and respiratory failure/covid, and hypokalmia Cardiology input appreciated Started on Eliquis due to chadvasc 6 Discontinued amlodipine Started on cardizem 120mg  qd. Discontinued aspirin since starting on Eliquis Echo EF 60 to 65%.  Grade 1 diastolic dysfunction. May need event monitor and f/u with cards as outpt.    3. Hypokalemia Improved with supplementation and stable.  Potassium 4.5 today.  4.  Essential hypertension. Stable continue with Cozaar, Inderal, and Cardizem     5. Type 2 diabetes mellitus. A1c 7.4 BG stable Initially blood glucose was elevated with IV steroids and improved   4. Depression and anxiety. Continue Effexor XR and Ativan    5. Vitamin B12 deficiency. Continue vitamin B12  6. GERD. Continue PPI     Discharge Diagnoses:  Active Problems:   Acute hypoxemic respiratory failure due to COVID-19 [name])   Sepsis due to urinary tract infection [facility])    Discharge Instructions  Discharge Instructions    Call MD for:  difficulty breathing, headache or visual disturbances   Complete by: As directed    Call MD for:  temperature >100.4   Complete by: As directed    Diet - low sodium heart healthy   Complete by: As directed    Diet Carb Modified   Complete by: As directed    Discharge instructions   Complete by: As directed    Follow low sodium, carb control diet Start cardizem on 5/29 Start your prednisone taper on 5/29 Avoid NSAIDSsince you are on blood thinners now Caution  with walking as you are on blood thinners F/u with cardiology Dr. Rockey Wu F/u with your pcp in one week Need to continue isolation for total of 10 days   Increase activity slowly   Complete by: As directed      Allergies as of 06/30/2020      Reactions   Iodinated Diagnostic Agents  Hives, Shortness Of Breath, Itching   Patient states she had itching, hives, and SOB after injection. She states she was given Benadryl before and after injection and still had reaction.      Medication List    STOP taking these medications   amLODipine 5 MG tablet Commonly known as: NORVASC   aspirin 81 MG tablet   celecoxib 200 MG capsule Commonly known as: CELEBREX     TAKE these medications   albuterol 108 (90 Base) MCG/ACT inhaler Commonly known as: VENTOLIN HFA Inhale 1-2 puffs into the lungs every 4 (four) hours as needed.   apixaban 5 MG Tabs tablet Commonly known as: ELIQUIS Take 1 tablet (5 mg total) by mouth 2 (two) times daily.   ascorbic acid 500 MG tablet Commonly known as: VITAMIN C Take 1 tablet (500 mg total) by mouth daily. Start taking on: Jul 01, 2020   atorvastatin 20 MG tablet Commonly known as: LIPITOR Take 1 tablet by mouth daily.   diltiazem 120 MG 24 hr capsule Commonly known as: CARDIZEM CD Take 1 capsule (120 mg total) by mouth daily. Start taking on: Jul 01, 2020   glimepiride 2 MG tablet Commonly known as: AMARYL Take 2 mg by mouth 2 (two) times daily.   guaiFENesin 600 MG 12 hr tablet Commonly known as: MUCINEX Take 1 tablet (600 mg total) by mouth 2 (two) times daily for 3 days.   LORazepam 1 MG tablet Commonly known as: ATIVAN Take 1 mg by mouth 2 (two) times daily as needed for anxiety or sleep.   losartan 50 MG tablet Commonly known as: COZAAR Take 1 tablet (50 mg total) by mouth daily. What changed: Another medication with the same name was removed. Continue taking this medication, and follow the directions you see here.   magnesium oxide 400 MG tablet Commonly known as: MAG-OX Take 400 mg by mouth 2 (two) times daily.   metFORMIN 500 MG 24 hr tablet Commonly known as: GLUCOPHAGE-XR Take 1,500 mg by mouth daily with supper.   omeprazole 40 MG capsule Commonly known as: PRILOSEC Take 1 capsule by mouth 2 (two) times  daily.   predniSONE 20 MG tablet Commonly known as: DELTASONE Take 1 tablet (20 mg total) by mouth daily for 4 doses. Start taking on: Jul 01, 2020   propranolol 10 MG tablet Commonly known as: INDERAL Take 10 mg by mouth 2 (two) times daily.   venlafaxine XR 150 MG 24 hr capsule Commonly known as: EFFEXOR-XR Take 150 mg by mouth daily. (take with 37.5mg  capsule to equal 187.5mg  total)   venlafaxine XR 37.5 MG 24 hr capsule Commonly known as: EFFEXOR-XR Take 37.5 mg by mouth daily. (take with 150mg  capsule to equal 187.5mg  total)   vitamin B-12 1000 MCG tablet Commonly known as: CYANOCOBALAMIN Take 1,000 mcg by mouth daily.   Vitamin D3 25 MCG tablet Commonly known as: Vitamin D Take 1 tablet (1,000 Units total) by mouth daily. Start taking on: Jul 01, 2020   zinc sulfate 220 (50 Zn) MG capsule Take 1 capsule (220 mg total) by mouth daily. Start taking on: Jul 01, 2020  zolpidem 10 MG tablet Commonly known as: AMBIEN Take 10 mg by mouth at bedtime as needed for sleep.            Durable Medical Equipment  (From admission, onward)         Start     Ordered   06/29/20 1530  For home use only DME oxygen  Once       Question Answer Comment  Length of Need 6 Months   Mode or (Route) Nasal cannula   Liters per Minute 2   Frequency Continuous (stationary and portable oxygen unit needed)   Oxygen conserving device Yes   Oxygen delivery system Gas      06/29/20 1529          Follow-up Information    Minna Merritts, MD Follow up in 2 week(s).   Specialty: Cardiology Why: we will arrange follow-up and contact you. Contact information: [place] [address] [phone]        Kayla Crouch, MD Follow up in 1 week(s).   Specialty: Internal Medicine Contact information: [place] [address] [phone]              Allergies  Allergen Reactions  . Iodinated  Diagnostic Agents Hives, Shortness Of Breath and Itching    Patient states she had itching, hives, and SOB after injection. She states she was given Benadryl before and after injection and still had reaction.    Consultations:  Cardiology   Procedures/Studies: DG Chest 2 View  Result Date: 06/25/2020 CLINICAL DATA:  75 year old female with shortness of breath. EXAM: CHEST - 2 VIEW COMPARISON:  Chest radiograph dated 01/20/2020 FINDINGS: Right-sided Port-A-Cath in similar position. There are bibasilar linear atelectasis. No focal consolidation, pleural effusion, or pneumothorax. Stable cardiac silhouette. No acute osseous pathology. Degenerative changes of the spine. IMPRESSION: No active cardiopulmonary disease. Electronically Signed   By: Anner Crete M.D.   On: 06/25/2020 17:20   ECHOCARDIOGRAM COMPLETE  Result Date: 06/29/2020    ECHOCARDIOGRAM REPORT   Patient Name:   Kayla Wu Date of Exam: 06/29/2020 Medical Rec #:  [id number]         Height:       63.5 in Accession #:    [id number]        Weight:       210.0 lb Date of Birth:  25-May-1945         BSA:          1.986 m Patient Age:    53 years          BP:           144/80 mmHg Patient Gender: F                 HR:           64 bpm. Exam Location:  [facility] Procedure: 2D Echo, Cardiac Doppler and Color Doppler Indications:     Atrial Fibrillation I48.91  History:         Patient has no prior history of Echocardiogram examinations.                  COPD; Risk Factors:Hypertension and Diabetes.  Sonographer:     Sherrie Sport RDCS (AE) Referring Phys:  [id number] [facility] Diagnosing Phys: Ida Rogue MD  Sonographer Comments: Technically challenging study due to limited acoustic windows, suboptimal parasternal window and suboptimal apical window. Image  acquisition challenging due to COPD. IMPRESSIONS  1. Left ventricular ejection fraction, by estimation, is 60 to 65%. The left ventricle has normal function. The left ventricle has no regional  wall motion abnormalities. Left ventricular diastolic parameters are consistent with Grade I diastolic dysfunction (impaired relaxation).  2. Right ventricular systolic function is normal. The right ventricular size is normal. There is normal pulmonary artery systolic pressure. The estimated right ventricular systolic pressure is 16.3 mmHg. FINDINGS  Left Ventricle: Left ventricular ejection fraction, by estimation, is 60 to 65%. The left ventricle has normal function. The left ventricle has no regional wall motion abnormalities. The left ventricular internal cavity size was normal in size. There is  no left ventricular hypertrophy. Left ventricular diastolic parameters are consistent with Grade I diastolic dysfunction (impaired relaxation). Right Ventricle: The right ventricular size is normal. No increase in right ventricular wall thickness. Right ventricular systolic function is normal. There is normal pulmonary artery systolic pressure. The tricuspid regurgitant velocity is 1.57 m/s, and  with an assumed right atrial pressure of 5 mmHg, the estimated right ventricular systolic pressure is 84.5 mmHg. Left Atrium: Left atrial size was normal in size. Right Atrium: Right atrial size was normal in size. Pericardium: There is no evidence of pericardial effusion. Mitral Valve: The mitral valve was not well visualized. No evidence of mitral valve regurgitation. No evidence of mitral valve stenosis. Tricuspid Valve: The tricuspid valve is not well visualized. Tricuspid valve regurgitation is not demonstrated. No evidence of tricuspid stenosis. Aortic Valve: The aortic valve was not well visualized. Aortic valve regurgitation is not visualized. No aortic stenosis is present. Aortic valve mean gradient measures 3.0 mmHg. Aortic valve peak gradient measures 4.5 mmHg. Aortic valve area, by VTI measures 2.79 cm. Pulmonic Valve: The pulmonic valve was not well visualized. Pulmonic valve regurgitation is not visualized. No  evidence of pulmonic stenosis. Aorta: The aortic root is normal in size and structure. Venous: The inferior vena cava is normal in size with greater than 50% respiratory variability, suggesting right atrial pressure of 3 mmHg. IAS/Shunts: No atrial level shunt detected by color flow Doppler.  LEFT VENTRICLE PLAX 2D LVIDd:         5.04 cm  Diastology LVIDs:         3.26 cm  LV e' medial:    4.35 cm/s LV PW:         1.21 cm  LV E/e' medial:  13.4 LV IVS:        1.22 cm  LV e' lateral:   9.68 cm/s LVOT diam:     2.00 cm  LV E/e' lateral: 6.0 LV SV:         63 LV SV Index:   32 LVOT Area:     3.14 cm  RIGHT VENTRICLE RV S prime:     10.60 cm/s TAPSE (M-mode): 3.3 cm LEFT ATRIUM             Index       RIGHT ATRIUM          Index LA diam:        3.30 cm 1.66 cm/m  RA Area:     7.24 cm LA Vol (A2C):   23.7 ml 11.93 ml/m RA Volume:   12.80 ml 6.45 ml/m LA Vol (A4C):   23.7 ml 11.93 ml/m LA Biplane Vol: 24.4 ml 12.29 ml/m  AORTIC VALVE AV Area (Vmax):    2.28 cm AV Area (Vmean):   2.16 cm AV Area (  VTI):     2.79 cm AV Vmax:           106.00 cm/s AV Vmean:          73.900 cm/s AV VTI:            0.225 m AV Peak Grad:      4.5 mmHg AV Mean Grad:      3.0 mmHg LVOT Vmax:         77.00 cm/s LVOT Vmean:        50.900 cm/s LVOT VTI:          0.200 m LVOT/AV VTI ratio: 0.89  AORTA Ao Root diam: 2.90 cm MITRAL VALVE               TRICUSPID VALVE MV Area (PHT): 2.45 cm    TR Peak grad:   9.9 mmHg MV Decel Time: 310 msec    TR Vmax:        157.00 cm/s MV E velocity: 58.30 cm/s MV A velocity: 69.80 cm/s  SHUNTS MV E/A ratio:  0.84        Systemic VTI:  0.20 m                            Systemic Diam: 2.00 cm Ida Rogue MD Electronically signed by Ida Rogue MD Signature Date/Time: 06/29/2020/7:31:23 PM    Final        Subjective: Feels much better.  Denies shortness of breath.  No chest pain or dizziness.  Discharge Exam: Vitals:   06/30/20 0848 06/30/20 1125  BP: (!) 135/44 (!) 140/98  Pulse: (!) 54 (!)  59  Resp: 18 18  Temp: 98.2 F (36.8 C) 98 F (36.7 C)  SpO2: 96%    Vitals:   06/29/20 2119 06/30/20 0410 06/30/20 0848 06/30/20 1125  BP: (!) 145/56 (!) 151/66 (!) 135/44 (!) 140/98  Pulse: (!) 52 (!) 44 (!) 54 (!) 59  Resp: 16 16 18 18   Temp: 98.1 F (36.7 C) 98.1 F (36.7 C) 98.2 F (36.8 C) 98 F (36.7 C)  TempSrc: Oral Oral Oral   SpO2: 95% 94% 96%   Weight:      Height:        General: Pt is alert, awake, not in acute distress Cardiovascular: RRR, S1/S2 +, no rubs, no gallops Respiratory: CTA bilaterally, no wheezing, no rhonchi Abdominal: Soft, NT, ND, bowel sounds + Extremities: Trace edema bilaterally    The results of significant diagnostics from this hospitalization (including imaging, microbiology, ancillary and laboratory) are listed below for reference.     Microbiology: Recent Results (from the past 240 hour(s))  Blood Culture (routine x 2)     Status: None   Collection Time: 06/25/20 10:20 PM   Specimen: BLOOD  Result Value Ref Range Status   Specimen Description BLOOD BLOOD RIGHT WRIST  Final   Special Requests   Final    BOTTLES DRAWN AEROBIC AND ANAEROBIC Blood Culture adequate volume   Culture   Final    NO GROWTH 5 DAYS Performed at [facility], [address] [place], [place] [address]    Report Status 06/30/2020 FINAL  Final  Blood Culture (routine x 2)     Status: None   Collection Time: 06/25/20 10:30 PM   Specimen: BLOOD  Result Value Ref Range Status   Specimen Description BLOOD BLOOD LEFT HAND  Final   Special Requests   Final  BOTTLES DRAWN AEROBIC AND ANAEROBIC Blood Culture adequate volume   Culture   Final    NO GROWTH 5 DAYS Performed at [facility], [address] [place], [place] [address]    Report Status 06/30/2020 FINAL  Final  Resp Panel by RT-PCR (Flu A&B, Covid) Nasopharyngeal Swab     Status: Abnormal   Collection Time: 06/25/20 10:30 PM   Specimen: Nasopharyngeal Swab;  Nasopharyngeal(NP) swabs in vial transport medium  Result Value Ref Range Status   SARS Coronavirus 2 by RT PCR POSITIVE (A) NEGATIVE Final    Comment: RESULT CALLED TO, READ BACK BY AND VERIFIED WITH: MIMI FAGGART RN [id number] 06/25/20 HNM (NOTE) SARS-CoV-2 target nucleic acids are DETECTED.  The SARS-CoV-2 RNA is generally detectable in upper respiratory specimens during the acute phase of infection. Positive results are indicative of the presence of the identified virus, but do not rule out bacterial infection or co-infection with other pathogens not detected by the test. Clinical correlation with patient history and other diagnostic information is necessary to determine patient infection status. The expected result is Negative.  Fact Sheet for Patients: EntrepreneurPulse.com.au  Fact Sheet for Healthcare Providers: IncredibleEmployment.be  This test is not yet approved or cleared by the [place] FDA and  has been authorized for detection and/or diagnosis of SARS-CoV-2 by FDA under an Emergency Use Authorization (EUA).  This EUA will remain in effect (meaning this test can be  used) for the duration of  the COVID-19 declaration under Section 564(b)(1) of the Act, 21 U.S.C. section 360bbb-3(b)(1), unless the authorization is terminated or revoked sooner.     Influenza A by PCR NEGATIVE NEGATIVE Final   Influenza B by PCR NEGATIVE NEGATIVE Final    Comment: (NOTE) The Xpert Xpress SARS-CoV-2/FLU/RSV plus assay is intended as an aid in the diagnosis of influenza from Nasopharyngeal swab specimens and should not be used as a sole basis for treatment. Nasal washings and aspirates are unacceptable for Xpert Xpress SARS-CoV-2/FLU/RSV testing.  Fact Sheet for Patients: EntrepreneurPulse.com.au  Fact Sheet for Healthcare Providers: IncredibleEmployment.be  This test is not yet approved or cleared by the  [place] FDA and has been authorized for detection and/or diagnosis of SARS-CoV-2 by FDA under an Emergency Use Authorization (EUA). This EUA will remain in effect (meaning this test can be used) for the duration of the COVID-19 declaration under Section 564(b)(1) of the Act, 21 U.S.C. section 360bbb-3(b)(1), unless the authorization is terminated or revoked.  Performed at [facility], [address] [place], [place] [address]      Labs: BNP (last 3 results) Recent Labs    01/19/20 1451 06/29/20 0717  BNP 64.7 032.1*   Basic Metabolic Panel: Recent Labs  Lab 06/26/20 0544 06/27/20 0441 06/28/20 0523 06/29/20 0717 06/30/20 0534 06/30/20 1356  NA 135 139 139 137 141  --   K 4.6 4.2 4.3 3.0* 4.5  --   CL 98 105 103 99 103  --   CO2 24 23 25 27 27   --   GLUCOSE 323* 202* 239* 74 77  --   BUN 32* 35* 26* 23 39*  --   CREATININE 0.94 0.86 0.85 0.77 0.96  --   CALCIUM 7.9* 8.0* 8.4* 8.7* 8.9  --   MG  --   --   --  1.4*  --  1.4*   Liver Function Tests: Recent Labs  Lab 06/26/20 0544 06/27/20 0441 06/28/20 0523 06/29/20 0717  AST 15 24 21  20  ALT 16 18 18 19   ALKPHOS 77 70 69 76  BILITOT 0.5 0.3 0.4 0.6  PROT 7.5 7.3 6.7 7.5  ALBUMIN 3.7 3.4* 3.2* 3.6   No results for input(s): LIPASE, AMYLASE in the last 168 hours. No results for input(s): AMMONIA in the last 168 hours. CBC: Recent Labs  Lab 06/26/20 0544 06/27/20 0441 06/28/20 0523 06/29/20 0717 06/30/20 1356  WBC 5.7 12.2* 11.9* 11.7* 17.0*  NEUTROABS 4.6 10.2* 10.1* 6.7 14.2*  HGB 12.3 11.6* 11.2* 13.1 13.2  HCT 35.7* 32.6* 33.0* 37.6 38.9  MCV 94.2 92.1 94.8 91.3 94.0  PLT 266 304 332 427* 434*   Cardiac Enzymes: No results for input(s): CKTOTAL, CKMB, CKMBINDEX, TROPONINI in the last 168 hours. BNP: Invalid input(s): POCBNP CBG: Recent Labs  Lab 06/29/20 1253 06/29/20 1747 06/29/20 2116 06/30/20 0845 06/30/20 1058  GLUCAP 138* 292* 254* 84 146*   D-Dimer Recent Labs     06/29/20 0717 06/30/20 0534  DDIMER 0.73* 0.74*   Hgb A1c No results for input(s): HGBA1C in the last 72 hours. Lipid Profile No results for input(s): CHOL, HDL, LDLCALC, TRIG, CHOLHDL, LDLDIRECT in the last 72 hours. Thyroid function studies Recent Labs    06/30/20 0534  TSH 1.883   Anemia work up Recent Labs    06/29/20 0717 06/30/20 0534  FERRITIN 128 123   Urinalysis    Component Value Date/Time   COLORURINE AMBER (A) 01/18/2020 1052   APPEARANCEUR CLOUDY (A) 01/18/2020 1052   LABSPEC 1.027 01/18/2020 1052   PHURINE 5.0 01/18/2020 1052   GLUCOSEU NEGATIVE 01/18/2020 [place] 01/18/2020 1052   BILIRUBINUR SMALL (A) 01/18/2020 1052   KETONESUR 5 (A) 01/18/2020 1052   PROTEINUR >=300 (A) 01/18/2020 1052   NITRITE NEGATIVE 01/18/2020 1052   LEUKOCYTESUR SMALL (A) 01/18/2020 1052   Sepsis Labs Invalid input(s): PROCALCITONIN,  WBC,  LACTICIDVEN Microbiology Recent Results (from the past 240 hour(s))  Blood Culture (routine x 2)     Status: None   Collection Time: 06/25/20 10:20 PM   Specimen: BLOOD  Result Value Ref Range Status   Specimen Description BLOOD BLOOD RIGHT WRIST  Final   Special Requests   Final    BOTTLES DRAWN AEROBIC AND ANAEROBIC Blood Culture adequate volume   Culture   Final    NO GROWTH 5 DAYS Performed at [facility], [address] [place], [place] [address]    Report Status 06/30/2020 FINAL  Final  Blood Culture (routine x 2)     Status: None   Collection Time: 06/25/20 10:30 PM   Specimen: BLOOD  Result Value Ref Range Status   Specimen Description BLOOD BLOOD LEFT HAND  Final   Special Requests   Final    BOTTLES DRAWN AEROBIC AND ANAEROBIC Blood Culture adequate volume   Culture   Final    NO GROWTH 5 DAYS Performed at [facility], [address] [place], [place] [address]    Report Status 06/30/2020 FINAL  Final  Resp Panel by RT-PCR (Flu A&B, Covid) Nasopharyngeal Swab     Status:  Abnormal   Collection Time: 06/25/20 10:30 PM   Specimen: Nasopharyngeal Swab; Nasopharyngeal(NP) swabs in vial transport medium  Result Value Ref Range Status   SARS Coronavirus 2 by RT PCR POSITIVE (A) NEGATIVE Final    Comment: RESULT CALLED TO, READ BACK BY AND VERIFIED WITH: MIMI FAGGART RN [id number] 06/25/20 HNM (NOTE) SARS-CoV-2 target nucleic acids are DETECTED.  The SARS-CoV-2 RNA is generally  detectable in upper respiratory specimens during the acute phase of infection. Positive results are indicative of the presence of the identified virus, but do not rule out bacterial infection or co-infection with other pathogens not detected by the test. Clinical correlation with patient history and other diagnostic information is necessary to determine patient infection status. The expected result is Negative.  Fact Sheet for Patients: EntrepreneurPulse.com.au  Fact Sheet for Healthcare Providers: IncredibleEmployment.be  This test is not yet approved or cleared by the [place] FDA and  has been authorized for detection and/or diagnosis of SARS-CoV-2 by FDA under an Emergency Use Authorization (EUA).  This EUA will remain in effect (meaning this test can be  used) for the duration of  the COVID-19 declaration under Section 564(b)(1) of the Act, 21 U.S.C. section 360bbb-3(b)(1), unless the authorization is terminated or revoked sooner.     Influenza A by PCR NEGATIVE NEGATIVE Final   Influenza B by PCR NEGATIVE NEGATIVE Final    Comment: (NOTE) The Xpert Xpress SARS-CoV-2/FLU/RSV plus assay is intended as an aid in the diagnosis of influenza from Nasopharyngeal swab specimens and should not be used as a sole basis for treatment. Nasal washings and aspirates are unacceptable for Xpert Xpress SARS-CoV-2/FLU/RSV testing.  Fact Sheet for Patients: EntrepreneurPulse.com.au  Fact Sheet for Healthcare  Providers: IncredibleEmployment.be  This test is not yet approved or cleared by the [place] FDA and has been authorized for detection and/or diagnosis of SARS-CoV-2 by FDA under an Emergency Use Authorization (EUA). This EUA will remain in effect (meaning this test can be used) for the duration of the COVID-19 declaration under Section 564(b)(1) of the Act, 21 U.S.C. section 360bbb-3(b)(1), unless the authorization is terminated or revoked.  Performed at [facility], [address], [place], [place] [address]      Time coordinating discharge: Over 30 minutes  SIGNED:   Nolberto Hanlon, MD  [facility] 06/30/2020, 2:51 PM Pager   If 7PM-7AM, please contact night-coverage [email or web address] Password [email or web address]

## 2020-07-03 ENCOUNTER — Telehealth: Payer: Self-pay | Admitting: Cardiovascular Disease

## 2020-07-03 NOTE — Telephone Encounter (Signed)
Attempted to schedule.  LMOV to call office.  ° °

## 2020-07-03 NOTE — Telephone Encounter (Signed)
-----   Message from Minna Merritts, MD sent at 06/30/2020  2:10 PM EDT ----- Leaving the hospital Saturday, May 28 Needs follow-up for atrial fibrillation, patient has COVID May be best to schedule 10 days or later APP okay Thx TG

## 2020-07-09 DIAGNOSIS — I480 Paroxysmal atrial fibrillation: Secondary | ICD-10-CM | POA: Diagnosis not present

## 2020-07-09 DIAGNOSIS — U071 COVID-19: Secondary | ICD-10-CM | POA: Diagnosis not present

## 2020-07-09 DIAGNOSIS — R0602 Shortness of breath: Secondary | ICD-10-CM | POA: Diagnosis not present

## 2020-07-09 DIAGNOSIS — Z79899 Other long term (current) drug therapy: Secondary | ICD-10-CM | POA: Diagnosis not present

## 2020-07-11 ENCOUNTER — Encounter: Payer: Self-pay | Admitting: Nurse Practitioner

## 2020-07-11 ENCOUNTER — Other Ambulatory Visit: Payer: Self-pay

## 2020-07-11 ENCOUNTER — Ambulatory Visit (INDEPENDENT_AMBULATORY_CARE_PROVIDER_SITE_OTHER): Payer: Medicare HMO | Admitting: Nurse Practitioner

## 2020-07-11 VITALS — BP 114/50 | HR 76 | Ht 64.0 in | Wt 210.2 lb

## 2020-07-11 DIAGNOSIS — I10 Essential (primary) hypertension: Secondary | ICD-10-CM | POA: Diagnosis not present

## 2020-07-11 DIAGNOSIS — J9601 Acute respiratory failure with hypoxia: Secondary | ICD-10-CM | POA: Diagnosis not present

## 2020-07-11 DIAGNOSIS — U071 COVID-19: Secondary | ICD-10-CM | POA: Diagnosis not present

## 2020-07-11 DIAGNOSIS — I480 Paroxysmal atrial fibrillation: Secondary | ICD-10-CM | POA: Diagnosis not present

## 2020-07-11 DIAGNOSIS — E119 Type 2 diabetes mellitus without complications: Secondary | ICD-10-CM | POA: Diagnosis not present

## 2020-07-11 MED ORDER — APIXABAN 5 MG PO TABS: 5.0000 mg | ORAL_TABLET | Freq: Two times a day (BID) | ORAL | 3 refills | Status: DC

## 2020-07-11 MED ORDER — DILTIAZEM HCL ER COATED BEADS 120 MG PO CP24: 120.0000 mg | ORAL_CAPSULE | Freq: Every day | ORAL | 3 refills | Status: DC

## 2020-07-11 NOTE — Progress Notes (Signed)
Office Visit    Patient Name: Kayla Wu Date of Encounter: 07/11/2020  Primary Care Provider:  Idelle Crouch, MD Primary Cardiologist:  Ida Rogue, MD  Chief Complaint    75 year old female with a history of remote tobacco abuse (42 pack years), COPD, diabetes, hypertension, hyperlipidemia, GERD, obesity, and ovarian cancer, who presents for follow-up after recent hospitalization for COVID-19 and paroxysmal atrial fibrillation.  Past Medical History    Past Medical History:  Diagnosis Date  . Chronic urticaria   . COPD (chronic obstructive pulmonary disease) ([place])   . COVID-19 virus infection 06/2020  . DDD (degenerative disc disease)   . Diabetes mellitus ([place])   . Diastolic dysfunction    a. 01/2018 Echo: Nl EF. Triv AI/MR/PR. Mild TR; b. 06/2020 Echo: EF 60-65%, no rwma, gr1 DD. Nl RV size/fxn. RVSP 14.32mmHg.  Marland Kitchen GERD (gastroesophageal reflux disease)   . Hypertension   . Menopausal state   . Morbid obesity ([place])   . Non-cardiac chest pain    a. 07/2014 MV: EF 75%, no ischemia/artifact.  . Ovarian cancer ([place]) 2012   s/p chemo   . PAF (paroxysmal atrial fibrillation) ([place])    a. Dx 06/2020 in setting of COVID infection; b. CHA2DS2VASc = 5-->eliquis.  . Personal history of chemotherapy 2012   ovarian  . Pneumonia 1987   Past Surgical History:  Procedure Laterality Date  . APPENDECTOMY    . CHOLECYSTECTOMY    . FINGER ARTHROPLASTY Left 12/22/2014   Procedure: FINGER ARTHROPLASTY;  Surgeon: [name], MD;  Location: [facility] ORS;  Service: Orthopedics;  Laterality: Left;  . hysterectomy      Allergies  Allergies  Allergen Reactions  . Iodinated Diagnostic Agents Hives, Shortness Of Breath and Itching    Patient states she had itching, hives, and SOB after injection. She states she was given Benadryl before and after injection and still had reaction.    History of Present Illness    75 year old female with the above past medical history  including remote tobacco abuse (42 pack years), COPD, diabetes, hypertension, hyperlipidemia, GERD, obesity, and ovarian cancer.  She also has a history of noncardiac chest pain with stress testing in 2016, which was normal.  Echocardiogram in 2019 showed normal LV function.  She was admitted to [facility] regional on May 23 after being found to be hypoxic and COVID-positive at urgent care.  She was treated with remdesivir, steroids, zinc, nebulizers, and vitamin C.  She had slow but steady improvement in respiratory status.  On the morning of May 27, just 1 day prior to discharge, she developed atrial fibrillation with rapid ventricular response and rates in the 160s.  She was completely asymptomatic.  She converted to sinus rhythm with PACs within 3 hours.  She was seen by our team with recommendation to initiate Eliquis and oral diltiazem.  Echocardiogram showed an EF of 60 to 65% without regional wall motion abnormalities, grade 1 diastolic dysfunction, and no significant valvular disease.  She was subsequently discharged May 28.  Since discharge, she has continued to note dyspnea with limited activity.  She does have a pulse oximeter at home and saturations are always greater than 90%.  She is somewhat surprised that is taking this long to recover from a COVID infection though notes that she has a long history of tobacco abuse and COPD as well.  She has not been having any chest pain or palpitations.  She denies PND, orthopnea, dizziness, syncope, edema, or early satiety.  She is in sinus rhythm today.  Home Medications    Prior to Admission medications   Medication Sig Start Date End Date Taking? Authorizing Provider  albuterol (VENTOLIN HFA) 108 (90 Base) MCG/ACT inhaler Inhale 1-2 puffs into the lungs every 4 (four) hours as needed. 01/20/20   [provider]  apixaban (ELIQUIS) 5 MG TABS tablet Take 1 tablet (5 mg total) by mouth 2 (two) times daily. 06/30/20 07/30/20  Nolberto Hanlon, MD   ascorbic acid (VITAMIN C) 500 MG tablet Take 1 tablet (500 mg total) by mouth daily. 07/01/20 07/31/20  Nolberto Hanlon, MD  atorvastatin (LIPITOR) 20 MG tablet Take 1 tablet by mouth daily. 04/29/20   [provider]  cholecalciferol (VITAMIN D) 25 MCG tablet Take 1 tablet (1,000 Units total) by mouth daily. 07/01/20 07/31/20  Nolberto Hanlon, MD  diltiazem (CARDIZEM CD) 120 MG 24 hr capsule Take 1 capsule (120 mg total) by mouth daily. 07/01/20 07/31/20  Nolberto Hanlon, MD  glimepiride (AMARYL) 2 MG tablet Take 2 mg by mouth 2 (two) times daily.    [provider]  LORazepam (ATIVAN) 1 MG tablet Take 1 mg by mouth 2 (two) times daily as needed for anxiety or sleep. 12/14/19   [provider]  losartan (COZAAR) 50 MG tablet Take 1 tablet (50 mg total) by mouth daily. 06/30/20 07/30/20  Nolberto Hanlon, MD  magnesium oxide (MAG-OX) 400 MG tablet Take 400 mg by mouth 2 (two) times daily.    [provider]  metFORMIN (GLUCOPHAGE-XR) 500 MG 24 hr tablet Take 1,500 mg by mouth daily with supper.    [provider]  omeprazole (PRILOSEC) 40 MG capsule Take 1 capsule by mouth 2 (two) times daily. 04/16/20   [provider]  propranolol (INDERAL) 10 MG tablet Take 10 mg by mouth 2 (two) times daily. 01/16/20   [provider]  venlafaxine XR (EFFEXOR-XR) 150 MG 24 hr capsule Take 150 mg by mouth daily. (take with 37.5mg  capsule to equal 187.5mg  total)    [provider]  venlafaxine XR (EFFEXOR-XR) 37.5 MG 24 hr capsule Take 37.5 mg by mouth daily. (take with 150mg  capsule to equal 187.5mg  total) 01/09/20   [provider]  vitamin B-12 (CYANOCOBALAMIN) 1000 MCG tablet Take 1,000 mcg by mouth daily.    [provider]  zinc sulfate 220 (50 Zn) MG capsule Take 1 capsule (220 mg total) by mouth daily. 07/01/20 07/31/20  Nolberto Hanlon, MD  zolpidem (AMBIEN) 10 MG tablet Take 10 mg by mouth at bedtime as needed for sleep.    [provider]    Review of Systems    Ongoing dyspnea on exertion since her hospitalization.  She denies chest pain, palpitations, PND, orthopnea, dizziness, syncope, edema, or early satiety.  All other systems reviewed and are otherwise negative except as noted above.  Physical Exam    VS:  BP (!) 114/50 (BP Location: Left Arm, Patient Position: Sitting, Cuff Size: Large)   Pulse 76   Ht 5\' 4"  (1.626 m)   Wt 210 lb 4 oz (95.4 kg)   SpO2 97%   BMI 36.09 kg/m  , BMI Body mass index is 36.09 kg/m. GEN: Obese, in no acute distress. HEENT: normal. Neck: Supple, no JVD, carotid bruits, or masses. Cardiac: RRR, no murmurs, rubs, or gallops. No clubbing, cyanosis, edema.  Radials/PT 2+ and equal bilaterally.  Respiratory:  Respirations regular and unlabored, clear to auscultation bilaterally. GI: Soft, nontender, nondistended, BS + x 4.  MS: no deformity or atrophy. Skin: warm and dry, no rash. Neuro:  Strength and sensation are intact. Psych: Normal affect.  Accessory Clinical Findings    ECG personally reviewed by me today -regular sinus rhythm, 76 - no acute changes.  Lab Results  Component Value Date   WBC 17.0 (H) 06/30/2020   HGB 13.2 06/30/2020   HCT 38.9 06/30/2020   MCV 94.0 06/30/2020   PLT 434 (H) 06/30/2020   Lab Results  Component Value Date   CREATININE 0.96 06/30/2020   BUN 39 (H) 06/30/2020   NA 141 06/30/2020   K 4.5 06/30/2020   CL 103 06/30/2020   CO2 27 06/30/2020   Lab Results  Component Value Date   ALT 19 06/29/2020   AST 20 06/29/2020   ALKPHOS 76 06/29/2020   BILITOT 0.6 06/29/2020   Lab Results  Component Value Date   TRIG 110 06/25/2020    Lab Results  Component Value Date   HGBA1C 7.4 (H) 06/26/2020    Assessment & Plan    1.  Paroxysmal atrial fibrillation: Patient had a 3-hour episode of atrial fibrillation during hospitalization for COVID-19.  Echocardiogram showed normal LV function.  She was asymptomatic at the time and has  not had any symptoms since discharge.  She is in sinus rhythm today.  I will continue oral diltiazem and Eliquis therapy in the setting of a CHA2DS2-VASc of 5.  CBC on June 6 with stable H&H of 11.8 and 35.5.  Stable renal function with creatinine 0.9 on June 6 as well.  2.  Recent COVID-19 infection/respiratory failure: Patient has not required oxygen at home but does note dyspnea on exertion with minimal activity.  She has followed up with primary care and will have serial chest x-rays.  She remains on inhaler therapy and her lungs are clear today.  With COPD history, she may benefit from pulmonary rehab.  3.  Essential hypertension: Stable on diltiazem and losartan therapy.  4.  Hypomagnesemia: This was noted during hospitalization.  Follow-up labs on June 6 continued show hypomagnesemia with a magnesium of 1.2.  This being managed by primary care and she is on supplementation.  5.  Hyperlipidemia: She is on Lipitor 20 mg with an LDL of 55 in February.  6.  Disposition: Follow-up in 4 to 6 weeks.    Murray Hodgkins, NP 07/11/2020, 6:36 PM

## 2020-07-11 NOTE — Patient Instructions (Signed)
Medication Instructions:  No changes at this time.  *If you need a refill on your cardiac medications before your next appointment, please call your pharmacy*   Lab Work: None  If you have labs (blood work) drawn today and your tests are completely normal, you will receive your results only by: Marland Kitchen MyChart Message (if you have MyChart) OR . A paper copy in the mail If you have any lab test that is abnormal or we need to change your treatment, we will call you to review the results.   Testing/Procedures: None   Follow-Up: At [facility], you and your health needs are our priority.  As part of our continuing mission to provide you with exceptional heart care, we have created designated Provider Care Teams.  These Care Teams include your primary Cardiologist (physician) and Advanced Practice Providers (APPs -  Physician Assistants and Nurse Practitioners) who all work together to provide you with the care you need, when you need it.  We recommend signing up for the patient portal called "MyChart".  Sign up information is provided on this After Visit Summary.  MyChart is used to connect with patients for Virtual Visits (Telemedicine).  Patients are able to view lab/test results, encounter notes, upcoming appointments, etc.  Non-urgent messages can be sent to your provider as well.   To learn more about what you can do with MyChart, go to [email or web address].    Your next appointment:   6-8  week(s)  The format for your next appointment:   In Person  Provider:   Ida Rogue, MD or Murray Hodgkins, NP

## 2020-07-18 ENCOUNTER — Telehealth: Payer: Self-pay | Admitting: Nurse Practitioner

## 2020-07-18 NOTE — Telephone Encounter (Signed)
Spoke with Kayla Wu at [facility] who states there is no problem with the prescription for diltiazem. It was mailed out yesterday-$0 copay. Patient informed and states she "does not know why she received the email from [facility]" but was very happy hear it has been mailed.

## 2020-07-18 NOTE — Telephone Encounter (Signed)
*  STAT* If patient is at the pharmacy, call can be transferred to refill team.   1. Which medications need to be refilled? (please list name of each medication and dose if known)  diltiazem 120 mg po q d   2. Which pharmacy/location (including street and city if local pharmacy) is medication to be sent to? [facility]   3. Do they need a 30 day or 90 day supply? 31   Sent on 6/8 but patient was called by [place] stating there is a problem with the rx.

## 2020-07-24 DIAGNOSIS — U071 COVID-19: Secondary | ICD-10-CM | POA: Diagnosis not present

## 2020-07-24 DIAGNOSIS — R918 Other nonspecific abnormal finding of lung field: Secondary | ICD-10-CM | POA: Diagnosis not present

## 2020-08-03 ENCOUNTER — Other Ambulatory Visit
Admission: RE | Admit: 2020-08-03 | Discharge: 2020-08-03 | Disposition: A | Discharge status: Home / Self Care | Payer: Medicare HMO | Source: Ambulatory Visit | Attending: Pulmonary Disease | Admitting: Pulmonary Disease

## 2020-08-03 DIAGNOSIS — R0602 Shortness of breath: Secondary | ICD-10-CM | POA: Insufficient documentation

## 2020-08-03 LAB — D-DIMER, QUANTITATIVE: D-Dimer, Quant: 0.37 ug/mL-FEU (ref 0.00–0.50)

## 2020-08-09 DIAGNOSIS — J431 Panlobular emphysema: Secondary | ICD-10-CM | POA: Diagnosis not present

## 2020-08-17 DIAGNOSIS — Z6836 Body mass index (BMI) 36.0-36.9, adult: Secondary | ICD-10-CM | POA: Diagnosis not present

## 2020-08-17 DIAGNOSIS — C563 Malignant neoplasm of bilateral ovaries: Secondary | ICD-10-CM | POA: Diagnosis not present

## 2020-08-17 DIAGNOSIS — C569 Malignant neoplasm of unspecified ovary: Secondary | ICD-10-CM | POA: Diagnosis not present

## 2020-08-26 NOTE — Progress Notes (Signed)
Cardiology Office Note  Date:  08/27/2020   ID:  Kayla Wu, DOB 02-Jul-1945, MRN [id number]  PCP:  Idelle Crouch, MD   Chief Complaint  Patient presents with   Other    6-8 week follow up. Patient c.o increased SOB. Meds reviewed verbally with patient.     HPI:  75 year old female with a history of  remote tobacco abuse (42 pack years),  COPD,  diabetes,  hypertension,  hyperlipidemia, GERD, obesity, and ovarian cancer,  who presents for follow-up after recent hospitalization for COVID-19 and paroxysmal atrial fibrillation.  LOV 07/2020 Reports propranolol was stopped, by pulmonary Walking daily around house 400 to 500 steps "Still recovering from covid" Plan on chest CT, allergy to dye  history of noncardiac chest pain with stress testing in 2016, which was normal.  Echocardiogram in 2019 showed normal LV function.    admitted to [facility] on Jun 25 2020 after being found to be hypoxic and COVID-positive at urgent care.   treated with remdesivir, steroids, zinc, nebulizers, and vitamin C.   slow improvement in respiratory status.    May 27, just 1 day prior to discharge, she developed atrial fibrillation with rapid ventricular response and rates in the 160s.    asymptomatic.   converted to sinus rhythm with PACs within 3 hours.   recommendation to initiate Eliquis and oral diltiazem.    Echocardiogram  06/2020 showed an EF of 60 to 65% without regional wall motion abnormalities, grade 1 diastolic dysfunction, and no significant valvular disease.    EKG personally reviewed by myself on todays visit NSR 72 bpm    PMH:   has a past medical history of Chronic urticaria, COPD (chronic obstructive pulmonary disease) ([place]), COVID-19 virus infection (06/2020), DDD (degenerative disc disease), Diabetes mellitus ([place]), Diastolic dysfunction, GERD (gastroesophageal reflux disease), Hypertension, Menopausal state, Morbid obesity ([place]), Non-cardiac chest pain,  Ovarian cancer ([place]) (2012), PAF (paroxysmal atrial fibrillation) ([place]), Personal history of chemotherapy (2012), and Pneumonia (1987).  PSH:    Past Surgical History:  Procedure Laterality Date   APPENDECTOMY     CHOLECYSTECTOMY     FINGER ARTHROPLASTY Left 12/22/2014   Procedure: FINGER ARTHROPLASTY;  Surgeon: [name], MD;  Location: [facility] ORS;  Service: Orthopedics;  Laterality: Left;   hysterectomy      Current Outpatient Medications  Medication Sig Dispense Refill   apixaban (ELIQUIS) 5 MG TABS tablet Take 1 tablet (5 mg total) by mouth 2 (two) times daily. 180 tablet 3   atorvastatin (LIPITOR) 20 MG tablet Take 1 tablet by mouth daily.     diltiazem (CARDIZEM CD) 120 MG 24 hr capsule Take 1 capsule (120 mg total) by mouth daily. 90 capsule 3   glimepiride (AMARYL) 2 MG tablet Take 2 mg by mouth 2 (two) times daily.     LORazepam (ATIVAN) 1 MG tablet Take 1 mg by mouth 2 (two) times daily as needed for anxiety or sleep.     losartan (COZAAR) 50 MG tablet Take 1 tablet (50 mg total) by mouth daily. 30 tablet 0   magnesium oxide (MAG-OX) 400 MG tablet Take 400 mg by mouth 2 (two) times daily.     metFORMIN (GLUCOPHAGE-XR) 500 MG 24 hr tablet Take 1,500 mg by mouth daily with supper.     omeprazole (PRILOSEC) 40 MG capsule Take 1 capsule by mouth 2 (two) times daily.     venlafaxine XR (EFFEXOR-XR) 150 MG 24 hr capsule Take 150 mg by mouth daily. (take with  37.'5mg'$  capsule to equal 187.'5mg'$  total)     venlafaxine XR (EFFEXOR-XR) 37.5 MG 24 hr capsule Take 37.5 mg by mouth daily. (take with '150mg'$  capsule to equal 187.'5mg'$  total)     vitamin B-12 (CYANOCOBALAMIN) 1000 MCG tablet Take 1,000 mcg by mouth daily.     zolpidem (AMBIEN) 10 MG tablet Take 10 mg by mouth at bedtime as needed for sleep.     albuterol (VENTOLIN HFA) 108 (90 Base) MCG/ACT inhaler Inhale 1-2 puffs into the lungs every 4 (four) hours as needed. (Patient not taking: Reported on 08/27/2020)     propranolol  (INDERAL) 10 MG tablet Take 10 mg by mouth 2 (two) times daily. (Patient not taking: Reported on 08/27/2020)     No current facility-administered medications for this visit.    Allergies:   Iodinated diagnostic agents   Social History:  The patient  reports that she quit smoking about 13 years ago. Her smoking use included cigarettes. She has a 84.00 pack-year smoking history. She has never used smokeless tobacco. She reports that she does not drink alcohol and does not use drugs.   Family History:   family history includes Breast cancer (age of onset: 66) in her mother; Colon cancer in her sister; Emphysema in her father.    Review of Systems: Review of Systems  Constitutional: Negative.   HENT: Negative.    Respiratory: Negative.    Cardiovascular: Negative.   Gastrointestinal: Negative.   Musculoskeletal: Negative.   Neurological: Negative.   Psychiatric/Behavioral: Negative.    All other systems reviewed and are negative.   PHYSICAL EXAM: VS:  BP 138/68 (BP Location: Left Arm, Patient Position: Sitting, Cuff Size: Large)   Pulse 72   Ht '5\' 4"'$  (1.626 m)   Wt 213 lb (96.6 kg)   SpO2 91%   BMI 36.56 kg/m  , BMI Body mass index is 36.56 kg/m. GEN: Well nourished, well developed, in no acute distress HEENT: normal Neck: no JVD, carotid bruits, or masses Cardiac: RRR; no murmurs, rubs, or gallops,no edema  Respiratory:  clear to auscultation bilaterally, normal work of breathing GI: soft, nontender, nondistended, + BS MS: no deformity or atrophy Skin: warm and dry, no rash Neuro:  Strength and sensation are intact Psych: euthymic mood, full affect   Recent Labs: 06/29/2020: ALT 19; B Natriuretic Peptide 270.0 06/30/2020: BUN 39; Creatinine, Ser 0.96; Hemoglobin 13.2; Magnesium 1.4; Platelets 434; Potassium 4.5; Sodium 141; TSH 1.883    Lipid Panel Lab Results  Component Value Date   TRIG 110 06/25/2020      Wt Readings from Last 3 Encounters:  08/27/20 213 lb  (96.6 kg)  07/11/20 210 lb 4 oz (95.4 kg)  06/25/20 210 lb (95.3 kg)      ASSESSMENT AND PLAN:  Problem List Items Addressed This Visit       Cardiology Problems   Benign essential HTN     Other   Acute hypoxemic respiratory failure due to COVID-19 ([place])   Diabetes mellitus ([place])   Other Visit Diagnoses     Paroxysmal atrial fibrillation (HCC)    -  Primary   COVID-19          Paroxysmal atrial fibrillation Maintaining normal sinus rhythm on diltiazem She reports propranolol recently held pulmonary Since then has noticed more tachycardia when she walks Recommend she increase her diltiazem up to extended release 180 daily She will ask pulmonary rather bisoprolol might be a good option if she continues to have tachycardia Elevated heart  rate driven by deconditioning, recovering from [place] she continue her exercise program  Diabetes type 2 We have encouraged continued exercise, careful diet management in an effort to lose weight.   Total encounter time more than 25 minutes  Greater than 50% was spent in counseling and coordination of care with the patient    Signed, Esmond Plants, M.D., Ph.D. [place], [place]

## 2020-08-27 ENCOUNTER — Other Ambulatory Visit: Payer: Self-pay

## 2020-08-27 ENCOUNTER — Encounter: Payer: Self-pay | Admitting: Cardiovascular Disease

## 2020-08-27 ENCOUNTER — Ambulatory Visit: Payer: Medicare HMO | Admitting: Cardiovascular Disease

## 2020-08-27 VITALS — BP 138/68 | HR 72 | Ht 64.0 in | Wt 213.0 lb

## 2020-08-27 DIAGNOSIS — U071 COVID-19: Secondary | ICD-10-CM | POA: Diagnosis not present

## 2020-08-27 DIAGNOSIS — I480 Paroxysmal atrial fibrillation: Secondary | ICD-10-CM | POA: Diagnosis not present

## 2020-08-27 DIAGNOSIS — J9601 Acute respiratory failure with hypoxia: Secondary | ICD-10-CM | POA: Diagnosis not present

## 2020-08-27 DIAGNOSIS — E119 Type 2 diabetes mellitus without complications: Secondary | ICD-10-CM

## 2020-08-27 DIAGNOSIS — I10 Essential (primary) hypertension: Secondary | ICD-10-CM | POA: Diagnosis not present

## 2020-08-27 MED ORDER — DILTIAZEM HCL ER COATED BEADS 180 MG PO CP24: 180.0000 mg | ORAL_CAPSULE | Freq: Every day | ORAL | 3 refills | Status: DC

## 2020-08-27 NOTE — Patient Instructions (Addendum)
Medication Instructions:  - Your physician has recommended you make the following change in your medication:  1) Please increase the diltiazem  ER up to 180 mg- take 1 capsule by mouth once daily   --Ask Pulmonary about if safe to use bisoprolol with current lung condition  If you need a refill on your cardiac medications before your next appointment, please call your pharmacy.    Lab work: No new labs needed   If you have labs (blood work) drawn today and your tests are completely normal, you will receive your results only by: [place] (if you have MyChart) OR A paper copy in the mail If you have any lab test that is abnormal or we need to change your treatment, we will call you to review the results.   Testing/Procedures: No new testing needed   Follow-Up: At [facility], you and your health needs are our priority.  As part of our continuing mission to provide you with exceptional heart care, we have created designated Provider Care Teams.  These Care Teams include your primary Cardiologist (physician) and Advanced Practice Providers (APPs -  Physician Assistants and Nurse Practitioners) who all work together to provide you with the care you need, when you need it.  You will need a follow up appointment in 6 months  Providers on your designated Care Team:   Murray Hodgkins, NP Christell Faith, PA-C Marrianne Mood, PA-C Cadence Kathlen Mody, [place]  Any Other Special Instructions Will Be Listed Below (If Applicable).  COVID-19 Vaccine Information can be found at: ShippingScam.co.uk For questions related to vaccine distribution or appointments, please email [email or web address] or call [phone].

## 2020-09-13 DIAGNOSIS — U099 Post covid-19 condition, unspecified: Secondary | ICD-10-CM | POA: Diagnosis not present

## 2020-09-13 DIAGNOSIS — R0609 Other forms of dyspnea: Secondary | ICD-10-CM | POA: Diagnosis not present

## 2020-09-19 DIAGNOSIS — E669 Obesity, unspecified: Secondary | ICD-10-CM | POA: Diagnosis not present

## 2020-09-19 DIAGNOSIS — E1169 Type 2 diabetes mellitus with other specified complication: Secondary | ICD-10-CM | POA: Diagnosis not present

## 2020-09-19 DIAGNOSIS — I10 Essential (primary) hypertension: Secondary | ICD-10-CM | POA: Diagnosis not present

## 2020-09-23 DIAGNOSIS — K5989 Other specified functional intestinal disorders: Secondary | ICD-10-CM | POA: Diagnosis not present

## 2020-09-23 DIAGNOSIS — J431 Panlobular emphysema: Secondary | ICD-10-CM | POA: Diagnosis not present

## 2020-09-23 DIAGNOSIS — Z20822 Contact with and (suspected) exposure to covid-19: Secondary | ICD-10-CM | POA: Diagnosis not present

## 2020-09-23 DIAGNOSIS — K5900 Constipation, unspecified: Secondary | ICD-10-CM | POA: Diagnosis not present

## 2020-09-23 DIAGNOSIS — K760 Fatty (change of) liver, not elsewhere classified: Secondary | ICD-10-CM | POA: Diagnosis not present

## 2020-09-23 DIAGNOSIS — K219 Gastro-esophageal reflux disease without esophagitis: Secondary | ICD-10-CM | POA: Diagnosis not present

## 2020-09-23 DIAGNOSIS — Z9981 Dependence on supplemental oxygen: Secondary | ICD-10-CM | POA: Diagnosis not present

## 2020-09-23 DIAGNOSIS — R0600 Dyspnea, unspecified: Secondary | ICD-10-CM | POA: Diagnosis not present

## 2020-09-23 DIAGNOSIS — C569 Malignant neoplasm of unspecified ovary: Secondary | ICD-10-CM | POA: Diagnosis not present

## 2020-09-23 DIAGNOSIS — K5909 Other constipation: Secondary | ICD-10-CM | POA: Diagnosis not present

## 2020-09-23 DIAGNOSIS — E785 Hyperlipidemia, unspecified: Secondary | ICD-10-CM | POA: Diagnosis not present

## 2020-09-23 DIAGNOSIS — Z6837 Body mass index (BMI) 37.0-37.9, adult: Secondary | ICD-10-CM | POA: Diagnosis not present

## 2020-09-23 DIAGNOSIS — J9601 Acute respiratory failure with hypoxia: Secondary | ICD-10-CM | POA: Diagnosis not present

## 2020-09-23 DIAGNOSIS — K6289 Other specified diseases of anus and rectum: Secondary | ICD-10-CM | POA: Diagnosis not present

## 2020-09-23 DIAGNOSIS — J449 Chronic obstructive pulmonary disease, unspecified: Secondary | ICD-10-CM | POA: Diagnosis not present

## 2020-09-23 DIAGNOSIS — E7800 Pure hypercholesterolemia, unspecified: Secondary | ICD-10-CM | POA: Diagnosis not present

## 2020-09-23 DIAGNOSIS — K56600 Partial intestinal obstruction, unspecified as to cause: Secondary | ICD-10-CM | POA: Diagnosis not present

## 2020-09-23 DIAGNOSIS — Z8543 Personal history of malignant neoplasm of ovary: Secondary | ICD-10-CM | POA: Diagnosis not present

## 2020-09-23 DIAGNOSIS — R109 Unspecified abdominal pain: Secondary | ICD-10-CM | POA: Diagnosis not present

## 2020-09-23 DIAGNOSIS — I480 Paroxysmal atrial fibrillation: Secondary | ICD-10-CM | POA: Diagnosis not present

## 2020-09-23 DIAGNOSIS — I10 Essential (primary) hypertension: Secondary | ICD-10-CM | POA: Diagnosis not present

## 2020-09-23 DIAGNOSIS — I2699 Other pulmonary embolism without acute cor pulmonale: Secondary | ICD-10-CM | POA: Diagnosis not present

## 2020-09-23 DIAGNOSIS — E119 Type 2 diabetes mellitus without complications: Secondary | ICD-10-CM | POA: Diagnosis not present

## 2020-09-23 DIAGNOSIS — R1032 Left lower quadrant pain: Secondary | ICD-10-CM | POA: Diagnosis not present

## 2020-09-23 DIAGNOSIS — K56699 Other intestinal obstruction unspecified as to partial versus complete obstruction: Secondary | ICD-10-CM | POA: Diagnosis not present

## 2020-09-23 DIAGNOSIS — J9692 Respiratory failure, unspecified with hypercapnia: Secondary | ICD-10-CM | POA: Diagnosis not present

## 2020-09-23 DIAGNOSIS — K5289 Other specified noninfective gastroenteritis and colitis: Secondary | ICD-10-CM | POA: Diagnosis not present

## 2020-09-23 DIAGNOSIS — Z7901 Long term (current) use of anticoagulants: Secondary | ICD-10-CM | POA: Diagnosis not present

## 2020-09-23 DIAGNOSIS — J9691 Respiratory failure, unspecified with hypoxia: Secondary | ICD-10-CM | POA: Diagnosis not present

## 2020-09-23 DIAGNOSIS — I4891 Unspecified atrial fibrillation: Secondary | ICD-10-CM | POA: Diagnosis not present

## 2020-09-23 DIAGNOSIS — R0609 Other forms of dyspnea: Secondary | ICD-10-CM | POA: Diagnosis not present

## 2020-09-23 DIAGNOSIS — U099 Post covid-19 condition, unspecified: Secondary | ICD-10-CM | POA: Diagnosis not present

## 2020-09-23 DIAGNOSIS — R918 Other nonspecific abnormal finding of lung field: Secondary | ICD-10-CM | POA: Diagnosis not present

## 2020-09-24 DIAGNOSIS — J9692 Respiratory failure, unspecified with hypercapnia: Secondary | ICD-10-CM | POA: Diagnosis not present

## 2020-09-24 DIAGNOSIS — I4891 Unspecified atrial fibrillation: Secondary | ICD-10-CM | POA: Diagnosis not present

## 2020-09-24 DIAGNOSIS — R0600 Dyspnea, unspecified: Secondary | ICD-10-CM | POA: Diagnosis not present

## 2020-09-24 DIAGNOSIS — K5900 Constipation, unspecified: Secondary | ICD-10-CM | POA: Diagnosis not present

## 2020-09-24 DIAGNOSIS — J9691 Respiratory failure, unspecified with hypoxia: Secondary | ICD-10-CM | POA: Diagnosis not present

## 2020-09-25 DIAGNOSIS — K5900 Constipation, unspecified: Secondary | ICD-10-CM | POA: Diagnosis not present

## 2020-09-25 DIAGNOSIS — J9691 Respiratory failure, unspecified with hypoxia: Secondary | ICD-10-CM | POA: Diagnosis not present

## 2020-09-25 DIAGNOSIS — I2699 Other pulmonary embolism without acute cor pulmonale: Secondary | ICD-10-CM | POA: Insufficient documentation

## 2020-09-25 DIAGNOSIS — Z9981 Dependence on supplemental oxygen: Secondary | ICD-10-CM | POA: Diagnosis not present

## 2020-09-25 DIAGNOSIS — R918 Other nonspecific abnormal finding of lung field: Secondary | ICD-10-CM | POA: Diagnosis not present

## 2020-09-25 DIAGNOSIS — J9692 Respiratory failure, unspecified with hypercapnia: Secondary | ICD-10-CM | POA: Diagnosis not present

## 2020-09-26 DIAGNOSIS — I4891 Unspecified atrial fibrillation: Secondary | ICD-10-CM | POA: Diagnosis not present

## 2020-09-26 DIAGNOSIS — K5900 Constipation, unspecified: Secondary | ICD-10-CM | POA: Diagnosis not present

## 2020-09-26 DIAGNOSIS — Z7901 Long term (current) use of anticoagulants: Secondary | ICD-10-CM | POA: Diagnosis not present

## 2020-09-26 DIAGNOSIS — Z8543 Personal history of malignant neoplasm of ovary: Secondary | ICD-10-CM | POA: Diagnosis not present

## 2020-09-26 DIAGNOSIS — I2699 Other pulmonary embolism without acute cor pulmonale: Secondary | ICD-10-CM | POA: Diagnosis not present

## 2020-09-26 DIAGNOSIS — R109 Unspecified abdominal pain: Secondary | ICD-10-CM | POA: Diagnosis not present

## 2020-09-28 DIAGNOSIS — E1159 Type 2 diabetes mellitus with other circulatory complications: Secondary | ICD-10-CM | POA: Diagnosis not present

## 2020-09-28 DIAGNOSIS — E1129 Type 2 diabetes mellitus with other diabetic kidney complication: Secondary | ICD-10-CM | POA: Diagnosis not present

## 2020-09-28 DIAGNOSIS — E1122 Type 2 diabetes mellitus with diabetic chronic kidney disease: Secondary | ICD-10-CM | POA: Diagnosis not present

## 2020-09-28 DIAGNOSIS — Z9289 Personal history of other medical treatment: Secondary | ICD-10-CM | POA: Diagnosis not present

## 2020-09-28 DIAGNOSIS — Z794 Long term (current) use of insulin: Secondary | ICD-10-CM | POA: Diagnosis not present

## 2020-09-28 DIAGNOSIS — E1169 Type 2 diabetes mellitus with other specified complication: Secondary | ICD-10-CM | POA: Diagnosis not present

## 2020-09-28 DIAGNOSIS — I2699 Other pulmonary embolism without acute cor pulmonale: Secondary | ICD-10-CM | POA: Diagnosis not present

## 2020-09-28 DIAGNOSIS — N1831 Chronic kidney disease, stage 3a: Secondary | ICD-10-CM | POA: Diagnosis not present

## 2020-09-28 DIAGNOSIS — E669 Obesity, unspecified: Secondary | ICD-10-CM | POA: Diagnosis not present

## 2020-09-28 DIAGNOSIS — R809 Proteinuria, unspecified: Secondary | ICD-10-CM | POA: Diagnosis not present

## 2020-10-01 DIAGNOSIS — I10 Essential (primary) hypertension: Secondary | ICD-10-CM | POA: Diagnosis not present

## 2020-10-01 DIAGNOSIS — Z794 Long term (current) use of insulin: Secondary | ICD-10-CM | POA: Diagnosis not present

## 2020-10-01 DIAGNOSIS — M47816 Spondylosis without myelopathy or radiculopathy, lumbar region: Secondary | ICD-10-CM | POA: Diagnosis not present

## 2020-10-01 DIAGNOSIS — J449 Chronic obstructive pulmonary disease, unspecified: Secondary | ICD-10-CM | POA: Diagnosis not present

## 2020-10-01 DIAGNOSIS — E119 Type 2 diabetes mellitus without complications: Secondary | ICD-10-CM | POA: Diagnosis not present

## 2020-10-01 DIAGNOSIS — Z86711 Personal history of pulmonary embolism: Secondary | ICD-10-CM | POA: Diagnosis not present

## 2020-10-01 DIAGNOSIS — K567 Ileus, unspecified: Secondary | ICD-10-CM | POA: Diagnosis not present

## 2020-10-01 DIAGNOSIS — E7800 Pure hypercholesterolemia, unspecified: Secondary | ICD-10-CM | POA: Diagnosis not present

## 2020-10-01 DIAGNOSIS — J9811 Atelectasis: Secondary | ICD-10-CM | POA: Diagnosis not present

## 2020-10-01 DIAGNOSIS — Z79899 Other long term (current) drug therapy: Secondary | ICD-10-CM | POA: Diagnosis not present

## 2020-10-01 DIAGNOSIS — R8290 Unspecified abnormal findings in urine: Secondary | ICD-10-CM | POA: Diagnosis not present

## 2020-10-01 DIAGNOSIS — J431 Panlobular emphysema: Secondary | ICD-10-CM | POA: Diagnosis not present

## 2020-10-09 ENCOUNTER — Encounter (INDEPENDENT_AMBULATORY_CARE_PROVIDER_SITE_OTHER): Payer: Self-pay

## 2020-10-09 ENCOUNTER — Encounter: Payer: Self-pay | Admitting: Pharmacist

## 2020-10-09 ENCOUNTER — Inpatient Hospital Stay: Payer: Medicare HMO

## 2020-10-09 ENCOUNTER — Inpatient Hospital Stay: Payer: Medicare HMO | Attending: Oncology | Admitting: Oncology

## 2020-10-09 ENCOUNTER — Other Ambulatory Visit: Payer: Self-pay

## 2020-10-09 ENCOUNTER — Other Ambulatory Visit (HOSPITAL_COMMUNITY): Payer: Self-pay

## 2020-10-09 ENCOUNTER — Encounter: Payer: Self-pay | Admitting: Oncology

## 2020-10-09 VITALS — BP 143/69 | HR 81 | Temp 98.0°F | Resp 18 | Wt 210.0 lb

## 2020-10-09 DIAGNOSIS — C541 Malignant neoplasm of endometrium: Secondary | ICD-10-CM | POA: Insufficient documentation

## 2020-10-09 DIAGNOSIS — E119 Type 2 diabetes mellitus without complications: Secondary | ICD-10-CM | POA: Insufficient documentation

## 2020-10-09 DIAGNOSIS — Z803 Family history of malignant neoplasm of breast: Secondary | ICD-10-CM | POA: Insufficient documentation

## 2020-10-09 DIAGNOSIS — Z90710 Acquired absence of both cervix and uterus: Secondary | ICD-10-CM | POA: Diagnosis not present

## 2020-10-09 DIAGNOSIS — Z808 Family history of malignant neoplasm of other organs or systems: Secondary | ICD-10-CM | POA: Insufficient documentation

## 2020-10-09 DIAGNOSIS — I10 Essential (primary) hypertension: Secondary | ICD-10-CM | POA: Insufficient documentation

## 2020-10-09 DIAGNOSIS — Z87891 Personal history of nicotine dependence: Secondary | ICD-10-CM | POA: Insufficient documentation

## 2020-10-09 DIAGNOSIS — I480 Paroxysmal atrial fibrillation: Secondary | ICD-10-CM | POA: Diagnosis not present

## 2020-10-09 DIAGNOSIS — C569 Malignant neoplasm of unspecified ovary: Secondary | ICD-10-CM | POA: Diagnosis not present

## 2020-10-09 DIAGNOSIS — I2699 Other pulmonary embolism without acute cor pulmonale: Secondary | ICD-10-CM | POA: Diagnosis not present

## 2020-10-09 LAB — CBC WITH DIFFERENTIAL/PLATELET
Abs Immature Granulocytes: 0.05 10*3/uL (ref 0.00–0.07)
Basophils Absolute: 0.1 10*3/uL (ref 0.0–0.1)
Basophils Relative: 1 %
Eosinophils Absolute: 0.3 10*3/uL (ref 0.0–0.5)
Eosinophils Relative: 2 %
HCT: 35.8 % — ABNORMAL LOW (ref 36.0–46.0)
Hemoglobin: 11.9 g/dL — ABNORMAL LOW (ref 12.0–15.0)
Immature Granulocytes: 0 %
Lymphocytes Relative: 23 %
Lymphs Abs: 2.8 10*3/uL (ref 0.7–4.0)
MCH: 31.6 pg (ref 26.0–34.0)
MCHC: 33.2 g/dL (ref 30.0–36.0)
MCV: 95.2 fL (ref 80.0–100.0)
Monocytes Absolute: 0.8 10*3/uL (ref 0.1–1.0)
Monocytes Relative: 7 %
Neutro Abs: 8.1 10*3/uL — ABNORMAL HIGH (ref 1.7–7.7)
Neutrophils Relative %: 67 %
Platelets: 440 10*3/uL — ABNORMAL HIGH (ref 150–400)
RBC: 3.76 MIL/uL — ABNORMAL LOW (ref 3.87–5.11)
RDW: 12.5 % (ref 11.5–15.5)
WBC: 12 10*3/uL — ABNORMAL HIGH (ref 4.0–10.5)
nRBC: 0 % (ref 0.0–0.2)

## 2020-10-09 LAB — COMPREHENSIVE METABOLIC PANEL
ALT: 20 U/L (ref 0–44)
AST: 25 U/L (ref 15–41)
Albumin: 4.3 g/dL (ref 3.5–5.0)
Alkaline Phosphatase: 96 U/L (ref 38–126)
Anion gap: 13 (ref 5–15)
BUN: 17 mg/dL (ref 8–23)
CO2: 26 mmol/L (ref 22–32)
Calcium: 9.1 mg/dL (ref 8.9–10.3)
Chloride: 94 mmol/L — ABNORMAL LOW (ref 98–111)
Creatinine, Ser: 0.83 mg/dL (ref 0.44–1.00)
GFR, Estimated: >60 mL/min (ref >60)
Glucose, Bld: 119 mg/dL — ABNORMAL HIGH (ref 70–99)
Potassium: 4.1 mmol/L (ref 3.5–5.1)
Sodium: 133 mmol/L — ABNORMAL LOW (ref 135–145)
Total Bilirubin: 0.5 mg/dL (ref 0.3–1.2)
Total Protein: 8.4 g/dL — ABNORMAL HIGH (ref 6.5–8.1)

## 2020-10-09 NOTE — Progress Notes (Signed)
New patient referral by Dr Lanney Gins.  reports fatigues very easily and get short of breath.  Pt has oxygen but does not wear all the time.

## 2020-10-09 NOTE — Progress Notes (Addendum)
Oral Chemotherapy Pharmacist Encounter  Dispensed samples to patient:  Medication: Xarelto '20mg'$   Instructions: Take 1 tablet ('20mg'$ ) by mouth daily with dinner.  Quantity dispensed: 21 Days supply: 21 Manufacturer: [employer] [id number] Exp: 06/03/2022  Ms. Hunke knows this is to be taking following completion of on hand '15mg'$  bid loading dose.   Darl Pikes, PharmD, BCPS, BCOP, CPP Hematology/Oncology Clinical Pharmacist Practitioner [facility] [phone]  10/09/2020 12:47 PM

## 2020-10-09 NOTE — Progress Notes (Signed)
Hematology/Oncology Consult note [facility] [phone] [phone]   Patient Care Team: Idelle Crouch, MD as PCP - General (Internal Medicine) Rockey Situ Kathlene November, MD as PCP - Cardiology (Cardiology)  REFERRING PROVIDER: Ottie Glazier, MD  CHIEF COMPLAINTS/REASON FOR VISIT:  Evaluation of pulmonary embolism  HISTORY OF PRESENTING ILLNESS:   Kayla Wu is a  75 y.o.  female with PMH listed below was seen in consultation at the request of  Ottie Glazier, MD  for evaluation of pulmonary embolism.  May 2022,  hospitalized at [facility] for shortness of breath, productive cough, generalized weakness and body aches.  She was tested positive for COVID-19.  Patient developed acute hypoxic respiratory failure, received remdesivir, IV steroid.  She also developed paroxysmal atrial fibrillation with RVR.  Patient was started on Eliquis due to CHA2DS2-VASc score of 6.  09/23/2020 - 09/27/2020, patient was hospitalized at [facility] due to 5 days of nausea, worsening abdominal pain and constipation. CT abdomen pelvis showed dilated loops of small bowel proximal to the right lower quadrant anastomosis.  Concerning for early/partial small bowel obstruction vs constipation.  Patient was started on bowel regimen and had a large bowel movement during her admission.  Nausea was controlled with antiemetics. Patient has been well Eliquis 5 mg twice daily since May 2022 discharge.  During admission patient developed shortness of breath, requiring 2 L of oxygen.  Chest x-ray reviewed no signs of pulm effusion, pneumothorax or consolidation. VQ scan showed high probability of PE.  CT chest noncontrast reviewed multifocal scarring but no evidence of interstitial disease.  Echocardiogram showed reviewed normal LV, RV size and function.  Mild mitral valve calcification and insufficient TR signal to evaluate pulmonary hypertension.  Patient was seen by hematologist during her  hospitalization.  She was recommended to stop Eliquis and started on Lovenox 100 mg twice daily for the remainder of her hospitalization stay at switch to Xarelto 15 mg twice daily for 21 days followed by Xarelto 20 mg daily thereafter for the treatment of PE.  Lower extremity ultrasound showed no evidence of DVT, possible obstruction proximal to the inguinal ligament of the left lower extremity.  Patient was seen recently by pulmonology.  Dr. Lanney Gins.  Patient continues to have chronic dyspnea, improved since her discharge, she has mentioned about high cost of Xarelto and patient was referred to establish care for further evaluation and possibility of switching to Coumadin with weekly INR check.  Patient was accompanied by her husband today.  Reports that her chronic shortness of breath has improved since discharge.   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 Initially presented with stage Ic grade 3 endometrial ovarian cancer in 2012.  She had BSO, surgery TLH/omentectomy. 6 cycles of carboplatin and Taxol. 2016, had recurrent disease with CA125 increased to 50.6. 02/09/2015, status post Surgery Infragastric omentectomy, supralevator posterior exenteration, iintraperitoneal rectosigmoid resection, mobilization splenic flexure, tumor debulking from anterior abdominal wall, bladder peritoneum, rectosigmoid. Ablation tumor from liver surface  January 2021 developed liver recurrence confirmed by liver biopsy.  Status post retreatment with carboplatin and Taxol. Status post [employer]. CT C/A/P from 05/2020 with no evidence of new or increasing disease 08/17/2020, most recent CA125 is normal at 8. 8/21/2022CT chest abdomen pelvis without contrast showed no evidence of new/increasing disease.   Review of Systems  Constitutional:  Negative for appetite change, chills, fatigue and fever.  HENT:   Negative for hearing loss and voice change.  Eyes:  Negative for eye problems.  Respiratory:  Negative for chest tightness and cough.   Cardiovascular:  Negative for chest pain.  Gastrointestinal:  Negative for abdominal distention, abdominal pain and blood in stool.  Endocrine: Negative for hot flashes.  Genitourinary:  Negative for difficulty urinating and frequency.   Musculoskeletal:  Negative for arthralgias.  Skin:  Negative for itching and rash.  Neurological:  Negative for extremity weakness.  Hematological:  Negative for adenopathy.  Psychiatric/Behavioral:  Negative for confusion.    MEDICAL HISTORY:  Past Medical History:  Diagnosis Date   Chronic urticaria    COPD (chronic obstructive pulmonary disease) ([place])    COVID-19 virus infection 06/2020   DDD (degenerative disc disease)    Diabetes mellitus (HCC)    Diastolic dysfunction    a. 01/2018 Echo: Nl EF. Triv AI/MR/PR. Mild TR; b. 06/2020 Echo: EF 60-65%, no rwma, gr1 DD. Nl RV size/fxn. RVSP 14.60mHg.   GERD (gastroesophageal reflux disease)    Hypertension    Menopausal state    Morbid obesity ([place]    Non-cardiac chest pain    a. 07/2014 MV: EF 75%, no ischemia/artifact.   Ovarian cancer ([place] 2012   s/p chemo    PAF (paroxysmal atrial fibrillation) ([place]    a. Dx 06/2020 in setting of COVID infection; b. CHA2DS2VASc = 5-->eliquis.   Personal history of chemotherapy 2012   ovarian   Pneumonia 1987    SURGICAL HISTORY: Past Surgical History:  Procedure Laterality Date   APPENDECTOMY     CHOLECYSTECTOMY     FINGER ARTHROPLASTY Left 12/22/2014   Procedure: FINGER ARTHROPLASTY;  Surgeon: CChristophe Louis MD;  Location: [facility] ORS;  Service: Orthopedics;  Laterality: Left;   hysterectomy      SOCIAL HISTORY: Social History   Socioeconomic History   Marital status: Married    Spouse name: Not on file   Number of children: 3   Years of education: Not on file   Highest education level: Not on file  Occupational History   Occupation: Retired   Tobacco Use   Smoking status: Former    Packs/day: 2.00    Years: 42.00    Pack years: 84.00    Types: Cigarettes    Quit date: 07/05/2007    Years since quitting: 13.2   Smokeless tobacco: Never  Vaping Use   Vaping Use: Never used  Substance and Sexual Activity   Alcohol use: No    Comment: occasional glass of wine once a month   Drug use: No   Sexual activity: Not on file  Other Topics Concern   Not on file  Social History Narrative   Lives locally w/ husband.  Does not routinely exercise.   Social Determinants of Health   Financial Resource Strain: Not on file  Food Insecurity: Not on file  Transportation Needs: Not on file  Physical Activity: Not on file  Stress: Not on file  Social Connections: Not on file  Intimate Partner Violence: Not on file    FAMILY HISTORY: Family History  Problem Relation Age of Onset   Breast cancer Mother 649  Emphysema Father        smoked   Colon cancer Sister    Uterine cancer Sister    Throat cancer Brother     ALLERGIES:  is allergic to iodinated diagnostic agents.  MEDICATIONS:  Current Outpatient Medications  Medication Sig Dispense Refill   atorvastatin (LIPITOR) 20 MG tablet Take 1 tablet by mouth daily.  diltiazem (CARDIZEM CD) 180 MG 24 hr capsule Take 1 capsule (180 mg total) by mouth daily. 90 capsule 3   Dulaglutide 1.5 MG/0.5ML SOPN Inject into the skin.     gabapentin (NEURONTIN) 600 MG tablet Take by mouth.     insulin NPH-regular Human (70-30) 100 UNIT/ML injection Inject into the skin.     LORazepam (ATIVAN) 1 MG tablet Take 1 mg by mouth 2 (two) times daily as needed for anxiety or sleep.     losartan (COZAAR) 50 MG tablet Take by mouth. Pt reports taking '100MG'$  DAILY     magnesium oxide (MAG-OX) 400 MG tablet Take 400 mg by mouth 2 (two) times daily.     metFORMIN (GLUCOPHAGE-XR) 500 MG 24 hr tablet Take 1,500 mg by mouth daily with supper.     omeprazole (PRILOSEC) 40 MG capsule Take 1 capsule by mouth  daily.     polyethylene glycol powder (GLYCOLAX/MIRALAX) 17 GM/SCOOP powder Take by mouth.     Rivaroxaban (XARELTO) 15 MG TABS tablet Take by mouth.     venlafaxine XR (EFFEXOR-XR) 150 MG 24 hr capsule Take 150 mg by mouth daily. (take with 37.'5mg'$  capsule to equal 187.'5mg'$  total)     venlafaxine XR (EFFEXOR-XR) 37.5 MG 24 hr capsule Take 37.5 mg by mouth daily. (take with '150mg'$  capsule to equal 187.'5mg'$  total)     vitamin B-12 (CYANOCOBALAMIN) 1000 MCG tablet Take 1,000 mcg by mouth daily.     bisacodyl (DULCOLAX) 10 MG suppository Place rectally. (Patient not taking: Reported on 10/09/2020)     [START ON 10/19/2020] rivaroxaban (XARELTO) 20 MG TABS tablet Take by mouth. (Patient not taking: Reported on 10/09/2020)     No current facility-administered medications for this visit.     PHYSICAL EXAMINATION: ECOG PERFORMANCE STATUS: 1 - Symptomatic but completely ambulatory Vitals:   10/09/20 1127  BP: (!) 143/69  Pulse: 81  Resp: 18  Temp: 98 F (36.7 C)  SpO2: 96%   Filed Weights   10/09/20 1127  Weight: 210 lb (95.3 kg)    Physical Exam Constitutional:      General: She is not in acute distress.    Appearance: She is obese.  HENT:     Head: Normocephalic and atraumatic.  Eyes:     General: No scleral icterus. Cardiovascular:     Rate and Rhythm: Normal rate and regular rhythm.     Heart sounds: Normal heart sounds.  Pulmonary:     Effort: Pulmonary effort is normal. No respiratory distress.     Breath sounds: No wheezing.  Abdominal:     General: Bowel sounds are normal. There is no distension.     Palpations: Abdomen is soft.  Musculoskeletal:        General: No deformity. Normal range of motion.     Cervical back: Normal range of motion and neck supple.  Skin:    General: Skin is warm and dry.     Findings: No erythema or rash.  Neurological:     Mental Status: She is alert and oriented to person, place, and time. Mental status is at baseline.     Cranial Nerves: No  cranial nerve deficit.     Coordination: Coordination normal.  Psychiatric:        Mood and Affect: Mood normal.    LABORATORY DATA:  I have reviewed the data as listed Lab Results  Component Value Date   WBC 12.0 (H) 10/09/2020   HGB 11.9 (L) 10/09/2020   HCT  35.8 (L) 10/09/2020   MCV 95.2 10/09/2020   PLT 440 (H) 10/09/2020   Recent Labs    06/28/20 0523 06/29/20 0717 06/30/20 0534 10/09/20 1202  NA 139 137 141 133*  K 4.3 3.0* 4.5 4.1  CL 103 99 103 94*  CO2 '25 27 27 26  '$ GLUCOSE 239* 74 77 119*  BUN 26* 23 39* 17  CREATININE 0.85 0.77 0.96 0.83  CALCIUM 8.4* 8.7* 8.9 9.1  GFRNONAA >60 >60 >60 >60  PROT 6.7 7.5  --  8.4*  ALBUMIN 3.2* 3.6  --  4.3  AST 21 20  --  25  ALT 18 19  --  20  ALKPHOS 69 76  --  96  BILITOT 0.4 0.6  --  0.5   Iron/TIBC/Ferritin/ %Sat    Component Value Date/Time   FERRITIN 123 06/30/2020 0534      RADIOGRAPHIC STUDIES: I have personally reviewed the radiological images as listed and agreed with the findings in the report. No results found.    ASSESSMENT & PLAN:  1. Other acute pulmonary embolism without acute cor pulmonale (HCC)   2. Endometrioid adenocarcinoma of ovary, unspecified laterality ([place])    #Probably provoked pulmonary embolism. Per history, she appears to have COVID-19 infection, hospitalization prior to that development of acute pulmonary embolism. Probably provoked event. Currently on Xarelto 15 mg twice daily, after completing 21 days, recommend patient to be switched to 20 mg daily. Patient has a high co-pay for Xarelto.  Xarelto 20 mg tablets samples provided to patient.  Will look into her insurance coverage of Xarelto and decide anticoagulation choices.  Recommend 6 months of anticoagulation. It is unclear if she developed PE due to resistance to Eliquis or transiently decreased absorption due to SBO.  Check factor V Leiden mutation, prothrombin mutation, antiphospholipid syndrome profile.    She also has  edema anticoagulation for paroxysmal atrial fibrillation.  Once she finishes 6 months of anticoagulation, I will defer to her cardiology for decision of long-term anticoagulation for atrial fibrillation.  History of platinum sensitive recurrent endometroid adenocarcinoma of ovary.  She follows up with gynonc at [facility]. Recent CA125 is normal, August CT showed no recurrence.     Orders Placed This Encounter  Procedures   Prothrombin gene mutation    Standing Status:   Future    Number of Occurrences:   1    Standing Expiration Date:   10/09/2021   Factor 5 leiden    Standing Status:   Future    Number of Occurrences:   1    Standing Expiration Date:   10/09/2021   ANTIPHOSPHOLIPID SYNDROME PROF    Standing Status:   Future    Number of Occurrences:   1    Standing Expiration Date:   10/09/2021   CBC with Differential/Platelet    Standing Status:   Future    Number of Occurrences:   1    Standing Expiration Date:   10/09/2021   Comprehensive metabolic panel    Standing Status:   Future    Number of Occurrences:   1    Standing Expiration Date:   10/09/2021    All questions were answered. The patient knows to call the clinic with any problems questions or concerns.  cc Ottie Glazier, MD    Return of visit: 3 months.  Thank you for this kind referral and the opportunity to participate in the care of this patient. A copy of today's note is routed to referring  provider    Earlie Server, MD, PhD Hematology Oncology [place] [facility]  10/09/2020

## 2020-10-11 LAB — FACTOR 5 LEIDEN

## 2020-10-11 LAB — ANTIPHOSPHOLIPID SYNDROME PROF
Anticardiolipin IgG: <9 GPL U/mL (ref 0–14)
Anticardiolipin IgM: 10 MPL U/mL (ref 0–12)
DRVVT: 60 s — ABNORMAL HIGH (ref 0.0–47.0)
PTT Lupus Anticoagulant: 32.7 s (ref 0.0–51.9)

## 2020-10-11 LAB — DRVVT MIX: dRVVT Mix: 51 s — ABNORMAL HIGH (ref 0.0–40.4)

## 2020-10-11 LAB — DRVVT CONFIRM: dRVVT Confirm: 1 ratio (ref 0.8–1.2)

## 2020-10-15 LAB — PROTHROMBIN GENE MUTATION

## 2020-10-17 ENCOUNTER — Other Ambulatory Visit (HOSPITAL_COMMUNITY): Payer: Self-pay

## 2020-10-26 ENCOUNTER — Other Ambulatory Visit: Payer: Self-pay

## 2020-10-26 MED ORDER — RIVAROXABAN 20 MG PO TABS: 20.0000 mg | ORAL_TABLET | Freq: Every day | ORAL | 0 refills | Status: DC

## 2020-10-27 DIAGNOSIS — J449 Chronic obstructive pulmonary disease, unspecified: Secondary | ICD-10-CM | POA: Diagnosis not present

## 2020-11-11 DIAGNOSIS — G4733 Obstructive sleep apnea (adult) (pediatric): Secondary | ICD-10-CM | POA: Diagnosis not present

## 2020-11-13 ENCOUNTER — Telehealth: Payer: Self-pay | Admitting: Cardiovascular Disease

## 2020-11-13 DIAGNOSIS — Z0279 Encounter for issue of other medical certificate: Secondary | ICD-10-CM

## 2020-11-13 NOTE — Telephone Encounter (Signed)
Patient came by office dropped of [employer] paperwork to signed. Signed forms & paid $29 fee in cash

## 2020-11-14 NOTE — Telephone Encounter (Signed)
LVM,  Heart and Stroke Claim Form is ready to be picked up.

## 2020-11-14 NOTE — Telephone Encounter (Signed)
Form completed to best of ability to remain truthful  Hx of HTN A-fib RVR (once on 06/29/2020) Pt admitted 06/25/2020 Acute Respiratory Failure w/hypoxia, COVID-19 Went into brieft persiod of A-fib RVR on 5/27 Ignacia Bayley, NP was consulted by hospitalist admission provider   Per progress notes on 06/29/2020 A/P: Paroxysmal atrial fibrillation In the setting of COVID-19 pneumonia, underlying COPD, hypokalemia, hypoxia -Converted back to normal sinus rhythm without intervention -Potassium appears to have been ordered for repletion 40 twice daily Started on Eliquis 5 twice daily Started on diltiazem extended release 120 daily. For recurrent episodes may need to increase her diltiazem dosing though may be limited by bradycardia.  Not a great candidate for antiarrhythmic such as amiodarone given underlying lung disease  Dr. Rockey Situ signed form Forms hand deliver to Pilar Ham at the front office.

## 2020-11-14 NOTE — Telephone Encounter (Signed)
Have received forms form mailbox from Mrs. [employer] "Heart and [place]"  ([employer])  RN will review and complete when times permit throughout clinic time Pt will be notified once completed when SECTION D: Physician Statement has been completed and signed

## 2020-11-21 DIAGNOSIS — I10 Essential (primary) hypertension: Secondary | ICD-10-CM | POA: Diagnosis not present

## 2020-11-21 DIAGNOSIS — Z87891 Personal history of nicotine dependence: Secondary | ICD-10-CM | POA: Diagnosis not present

## 2020-11-21 DIAGNOSIS — Z6835 Body mass index (BMI) 35.0-35.9, adult: Secondary | ICD-10-CM | POA: Diagnosis not present

## 2020-11-21 DIAGNOSIS — E119 Type 2 diabetes mellitus without complications: Secondary | ICD-10-CM | POA: Diagnosis not present

## 2020-11-21 DIAGNOSIS — C569 Malignant neoplasm of unspecified ovary: Secondary | ICD-10-CM | POA: Diagnosis not present

## 2020-11-21 DIAGNOSIS — K769 Liver disease, unspecified: Secondary | ICD-10-CM | POA: Diagnosis not present

## 2020-11-21 DIAGNOSIS — C563 Malignant neoplasm of bilateral ovaries: Secondary | ICD-10-CM | POA: Diagnosis not present

## 2020-11-21 DIAGNOSIS — I4891 Unspecified atrial fibrillation: Secondary | ICD-10-CM | POA: Diagnosis not present

## 2020-11-21 DIAGNOSIS — Z8543 Personal history of malignant neoplasm of ovary: Secondary | ICD-10-CM | POA: Diagnosis not present

## 2020-11-21 DIAGNOSIS — R918 Other nonspecific abnormal finding of lung field: Secondary | ICD-10-CM | POA: Diagnosis not present

## 2020-11-21 DIAGNOSIS — Z823 Family history of stroke: Secondary | ICD-10-CM | POA: Diagnosis not present

## 2020-11-21 DIAGNOSIS — M79605 Pain in left leg: Secondary | ICD-10-CM | POA: Diagnosis not present

## 2020-11-21 DIAGNOSIS — E7800 Pure hypercholesterolemia, unspecified: Secondary | ICD-10-CM | POA: Diagnosis not present

## 2020-11-26 DIAGNOSIS — J449 Chronic obstructive pulmonary disease, unspecified: Secondary | ICD-10-CM | POA: Diagnosis not present

## 2020-11-28 DIAGNOSIS — I10 Essential (primary) hypertension: Secondary | ICD-10-CM | POA: Diagnosis not present

## 2020-11-28 DIAGNOSIS — E1140 Type 2 diabetes mellitus with diabetic neuropathy, unspecified: Secondary | ICD-10-CM | POA: Diagnosis not present

## 2020-11-28 DIAGNOSIS — J431 Panlobular emphysema: Secondary | ICD-10-CM | POA: Diagnosis not present

## 2020-11-28 DIAGNOSIS — Z794 Long term (current) use of insulin: Secondary | ICD-10-CM | POA: Diagnosis not present

## 2020-11-28 DIAGNOSIS — E7800 Pure hypercholesterolemia, unspecified: Secondary | ICD-10-CM | POA: Diagnosis not present

## 2020-11-28 DIAGNOSIS — J449 Chronic obstructive pulmonary disease, unspecified: Secondary | ICD-10-CM | POA: Diagnosis not present

## 2020-11-28 DIAGNOSIS — M539 Dorsopathy, unspecified: Secondary | ICD-10-CM | POA: Diagnosis not present

## 2020-12-11 DIAGNOSIS — G8929 Other chronic pain: Secondary | ICD-10-CM | POA: Diagnosis not present

## 2020-12-11 DIAGNOSIS — M545 Low back pain, unspecified: Secondary | ICD-10-CM | POA: Diagnosis not present

## 2020-12-11 DIAGNOSIS — M4697 Unspecified inflammatory spondylopathy, lumbosacral region: Secondary | ICD-10-CM | POA: Diagnosis not present

## 2020-12-11 DIAGNOSIS — R1030 Lower abdominal pain, unspecified: Secondary | ICD-10-CM | POA: Diagnosis not present

## 2020-12-11 DIAGNOSIS — M1612 Unilateral primary osteoarthritis, left hip: Secondary | ICD-10-CM | POA: Diagnosis not present

## 2020-12-11 DIAGNOSIS — M47816 Spondylosis without myelopathy or radiculopathy, lumbar region: Secondary | ICD-10-CM | POA: Diagnosis not present

## 2020-12-11 DIAGNOSIS — M47817 Spondylosis without myelopathy or radiculopathy, lumbosacral region: Secondary | ICD-10-CM | POA: Diagnosis not present

## 2020-12-11 DIAGNOSIS — R1032 Left lower quadrant pain: Secondary | ICD-10-CM | POA: Diagnosis not present

## 2020-12-14 DIAGNOSIS — M1612 Unilateral primary osteoarthritis, left hip: Secondary | ICD-10-CM | POA: Diagnosis not present

## 2020-12-25 DIAGNOSIS — M810 Age-related osteoporosis without current pathological fracture: Secondary | ICD-10-CM | POA: Diagnosis not present

## 2020-12-31 DIAGNOSIS — E1159 Type 2 diabetes mellitus with other circulatory complications: Secondary | ICD-10-CM | POA: Diagnosis not present

## 2020-12-31 DIAGNOSIS — R809 Proteinuria, unspecified: Secondary | ICD-10-CM | POA: Diagnosis not present

## 2020-12-31 DIAGNOSIS — Z794 Long term (current) use of insulin: Secondary | ICD-10-CM | POA: Diagnosis not present

## 2020-12-31 DIAGNOSIS — E1169 Type 2 diabetes mellitus with other specified complication: Secondary | ICD-10-CM | POA: Diagnosis not present

## 2020-12-31 DIAGNOSIS — E1122 Type 2 diabetes mellitus with diabetic chronic kidney disease: Secondary | ICD-10-CM | POA: Diagnosis not present

## 2020-12-31 DIAGNOSIS — E669 Obesity, unspecified: Secondary | ICD-10-CM | POA: Diagnosis not present

## 2020-12-31 DIAGNOSIS — E1129 Type 2 diabetes mellitus with other diabetic kidney complication: Secondary | ICD-10-CM | POA: Diagnosis not present

## 2020-12-31 DIAGNOSIS — N1831 Chronic kidney disease, stage 3a: Secondary | ICD-10-CM | POA: Diagnosis not present

## 2021-01-03 ENCOUNTER — Telehealth: Payer: Self-pay | Admitting: Oncology

## 2021-01-03 ENCOUNTER — Other Ambulatory Visit: Payer: Self-pay | Admitting: *Deleted

## 2021-01-03 DIAGNOSIS — C569 Malignant neoplasm of unspecified ovary: Secondary | ICD-10-CM

## 2021-01-03 DIAGNOSIS — I2699 Other pulmonary embolism without acute cor pulmonale: Secondary | ICD-10-CM

## 2021-01-03 NOTE — Telephone Encounter (Signed)
Patient requested to cancel all future appointments--she is being followed by her PCP and does not require any follow-ups.

## 2021-01-03 NOTE — Telephone Encounter (Signed)
Pt called to cancel her appt. Please give a call back to reschedule at [phone]

## 2021-01-04 DIAGNOSIS — R1032 Left lower quadrant pain: Secondary | ICD-10-CM | POA: Diagnosis not present

## 2021-01-04 DIAGNOSIS — M1612 Unilateral primary osteoarthritis, left hip: Secondary | ICD-10-CM | POA: Diagnosis not present

## 2021-01-08 ENCOUNTER — Other Ambulatory Visit: Payer: Medicare HMO

## 2021-01-08 ENCOUNTER — Ambulatory Visit: Payer: Medicare HMO | Admitting: Oncology

## 2021-02-12 DIAGNOSIS — Z79899 Other long term (current) drug therapy: Secondary | ICD-10-CM | POA: Diagnosis not present

## 2021-02-12 DIAGNOSIS — I2699 Other pulmonary embolism without acute cor pulmonale: Secondary | ICD-10-CM | POA: Diagnosis not present

## 2021-02-12 DIAGNOSIS — E7800 Pure hypercholesterolemia, unspecified: Secondary | ICD-10-CM | POA: Diagnosis not present

## 2021-02-12 DIAGNOSIS — R5381 Other malaise: Secondary | ICD-10-CM | POA: Diagnosis not present

## 2021-02-12 DIAGNOSIS — R5383 Other fatigue: Secondary | ICD-10-CM | POA: Diagnosis not present

## 2021-02-24 NOTE — Progress Notes (Signed)
Cardiology Office Note  Date:  02/25/2021   ID:  Kayla Wu, DOB 18-Aug-1945, MRN [id number]  PCP:  Idelle Crouch, MD   Chief Complaint  Patient presents with   6 month follow up    Patient c/o shortness of breath and rapid heart beats with any activity. Medications reviewed by the patient verbally.     HPI:  76 year old female with a history of  remote tobacco abuse (42 pack years),  COPD,  diabetes,  hypertension,  hyperlipidemia, GERD, obesity, and ovarian cancer,  COVID infection May 2022 Aortic atherosclerosis on CT scan who presents for follow-up after recent hospitalization for COVID-19 and paroxysmal atrial fibrillation.  LOV July 2022 Prior events, developed atrial fibrillation during COVID May 2022 Propranolol previously held by pulmonary Has made full recovery Maintained on warfarin, managed by [facility]  Echocardiogram May 2022 normal ejection fraction 60% normal right heart pressures  In follow-up today Main complaint is chronic low back pain, knee pain Sedentary, unable to exercise Presents today in a wheelchair Scheduled to receive cortisone shots next week  Reports having worsening tachy palpitations when walking, sometimes heart rates up to 130.  Sometimes will happen at rest Makes her feel short of breath when she has tachycardia Not on beta-blocker, she is on calcium channel blocker  Lab work reviewed HGB 8.0, started on iron A1C 6.9 Total chol 152, LDL55  EKG personally reviewed by myself on todays visit NSr rate 70 bpm no significant ST-T wave  Other past medical history reviewed history of noncardiac chest pain with stress testing in 2016, which was normal.  Echocardiogram in 2019 showed normal LV function.    admitted to [facility] on Jun 25 2020 after being found to be hypoxic and COVID-positive at urgent care.   treated with remdesivir, steroids, zinc, nebulizers, and vitamin C.   slow improvement in respiratory status.     May 27, just 1 day prior to discharge, she developed atrial fibrillation with rapid ventricular response and rates in the 160s.    asymptomatic.   converted to sinus rhythm with PACs within 3 hours.   recommendation to initiate Eliquis and oral diltiazem.    Echocardiogram  06/2020 showed an EF of 60 to 65% without regional wall motion abnormalities, grade 1 diastolic dysfunction, and no significant valvular disease.     PMH:   has a past medical history of Atrial fibrillation with RVR ([place]) (06/29/2020), Chronic urticaria, COPD (chronic obstructive pulmonary disease) ([place]), COVID-19 virus infection (06/2020), DDD (degenerative disc disease), Diabetes mellitus ([place]), Diastolic dysfunction, GERD (gastroesophageal reflux disease), Hypertension, Menopausal state, Morbid obesity ([place]), Non-cardiac chest pain, Ovarian cancer ([place]) (2012), PAF (paroxysmal atrial fibrillation) ([place]), Personal history of chemotherapy (2012), and Pneumonia (1987).  PSH:    Past Surgical History:  Procedure Laterality Date   APPENDECTOMY     CHOLECYSTECTOMY     FINGER ARTHROPLASTY Left 12/22/2014   Procedure: FINGER ARTHROPLASTY;  Surgeon: [name], MD;  Location: [facility] ORS;  Service: Orthopedics;  Laterality: Left;   hysterectomy      Current Outpatient Medications  Medication Sig Dispense Refill   atorvastatin (LIPITOR) 20 MG tablet Take 1 tablet by mouth daily.     diltiazem (CARDIZEM CD) 180 MG 24 hr capsule Take 1 capsule (180 mg total) by mouth daily. 90 capsule 3   ferrous sulfate 325 (65 FE) MG tablet Take 325 mg by mouth daily with breakfast.     gabapentin (NEURONTIN) 600 MG tablet Take 600 mg by mouth  at bedtime.     insulin NPH-regular Human (70-30) 100 UNIT/ML injection Inject into the skin as needed.     LORazepam (ATIVAN) 1 MG tablet Take 1 mg by mouth 2 (two) times daily as needed for anxiety or sleep.     losartan (COZAAR) 100 MG tablet Take 100 mg by mouth daily.     magnesium oxide  (MAG-OX) 400 MG tablet Take 400 mg by mouth 2 (two) times daily.     metFORMIN (GLUCOPHAGE-XR) 500 MG 24 hr tablet Take 1,500 mg by mouth daily with supper.     omeprazole (PRILOSEC) 40 MG capsule Take 1 capsule by mouth daily.     TRULICITY 1.5 KX/3.8HW SOPN Inject 1.5 mg into the skin once a week.     venlafaxine XR (EFFEXOR-XR) 150 MG 24 hr capsule Take 150 mg by mouth daily. (take with 37.5mg  capsule to equal 187.5mg  total)     venlafaxine XR (EFFEXOR-XR) 37.5 MG 24 hr capsule Take 37.5 mg by mouth daily. (take with 150mg  capsule to equal 187.5mg  total)     vitamin B-12 (CYANOCOBALAMIN) 1000 MCG tablet Take 1,000 mcg by mouth daily.     warfarin (COUMADIN) 5 MG tablet Take 5 mg by mouth daily.     No current facility-administered medications for this visit.    Allergies:   Iodinated contrast media   Social History:  The patient  reports that she quit smoking about 13 years ago. Her smoking use included cigarettes. She has a 84.00 pack-year smoking history. She has never used smokeless tobacco. She reports that she does not drink alcohol and does not use drugs.   Family History:   family history includes Breast cancer (age of onset: 12) in her mother; Colon cancer in her sister; Emphysema in her father; Throat cancer in her brother; Uterine cancer in her sister.    Review of Systems: Review of Systems  Constitutional: Negative.   HENT: Negative.    Respiratory: Negative.    Cardiovascular: Negative.   Gastrointestinal: Negative.   Musculoskeletal: Negative.   Neurological: Negative.   Psychiatric/Behavioral: Negative.    All other systems reviewed and are negative.   PHYSICAL EXAM: VS:  BP 140/64 (BP Location: Left Arm, Patient Position: Sitting, Cuff Size: Normal)    Pulse 70    Ht 5\' 4"  (1.626 m)    Wt 197 lb 8 oz (89.6 kg)    SpO2 94%    BMI 33.90 kg/m  , BMI Body mass index is 33.9 kg/m. GEN: Well nourished, well developed, in no acute distress HEENT: normal Neck: no JVD,  carotid bruits, or masses Cardiac: RRR; no murmurs, rubs, or gallops,no edema  Respiratory:  clear to auscultation bilaterally, normal work of breathing GI: soft, nontender, nondistended, + BS MS: no deformity or atrophy Skin: warm and dry, no rash Neuro:  Strength and sensation are intact Psych: euthymic mood, full affect   Recent Labs: 06/29/2020: B Natriuretic Peptide 270.0 06/30/2020: Magnesium 1.4; TSH 1.883 10/09/2020: ALT 20; BUN 17; Creatinine, Ser 0.83; Hemoglobin 11.9; Platelets 440; Potassium 4.1; Sodium 133    Lipid Panel Lab Results  Component Value Date   TRIG 110 06/25/2020      Wt Readings from Last 3 Encounters:  02/25/21 197 lb 8 oz (89.6 kg)  10/09/20 210 lb (95.3 kg)  08/27/20 213 lb (96.6 kg)     ASSESSMENT AND PLAN:  Problem List Items Addressed This Visit       Cardiology Problems   Benign essential  HTN   Relevant Medications   losartan (COZAAR) 100 MG tablet   warfarin (COUMADIN) 5 MG tablet   Other Relevant Orders   EKG 12-Lead     Other   Acute hypoxemic respiratory failure due to COVID-19 (HCC)   Diabetes mellitus (HCC)   Relevant Medications   TRULICITY 1.5 TW/4.4QK SOPN   losartan (COZAAR) 100 MG tablet   Other Visit Diagnoses     Paroxysmal atrial fibrillation (HCC)    -  Primary   Relevant Medications   losartan (COZAAR) 100 MG tablet   warfarin (COUMADIN) 5 MG tablet   Other Relevant Orders   EKG 12-Lead   COVID-19         Paroxysmal atrial fibrillation Maintaining normal sinus rhythm on diltiazem Having more tach palpitations symptoms of heart rates up to 130 Unable to exclude paroxysmal atrial tachycardia/atrial fibrillation Recommend she add bisoprolol 5 mg daily For days with tachycardia, could take extra bisoprolol Should be safe to take bisoprolol in the setting of underlying COPD  COPD Managed by pulmonary, no recent flares  Diabetes type 2 Weight has trended down, weight last year 210 now 197 Lifestyle  modification recommended, Unable to exercise Weight still above goal,  Aortic atherosclerosis Seen on CT scan, cholesterol at goal   Total encounter time more than 25 minutes  Greater than 50% was spent in counseling and coordination of care with the patient    Signed, Esmond Plants, M.D., Ph.D. [place], [place]

## 2021-02-25 ENCOUNTER — Other Ambulatory Visit: Payer: Self-pay | Admitting: Family Medicine

## 2021-02-25 ENCOUNTER — Ambulatory Visit: Payer: Medicare HMO | Admitting: Cardiovascular Disease

## 2021-02-25 ENCOUNTER — Encounter: Payer: Self-pay | Admitting: Cardiovascular Disease

## 2021-02-25 ENCOUNTER — Other Ambulatory Visit: Payer: Self-pay

## 2021-02-25 VITALS — BP 140/64 | HR 70 | Ht 64.0 in | Wt 197.5 lb

## 2021-02-25 DIAGNOSIS — R1032 Left lower quadrant pain: Secondary | ICD-10-CM | POA: Diagnosis not present

## 2021-02-25 DIAGNOSIS — M545 Low back pain, unspecified: Secondary | ICD-10-CM | POA: Diagnosis not present

## 2021-02-25 DIAGNOSIS — I10 Essential (primary) hypertension: Secondary | ICD-10-CM | POA: Diagnosis not present

## 2021-02-25 DIAGNOSIS — J9601 Acute respiratory failure with hypoxia: Secondary | ICD-10-CM | POA: Diagnosis not present

## 2021-02-25 DIAGNOSIS — Z7901 Long term (current) use of anticoagulants: Secondary | ICD-10-CM | POA: Diagnosis not present

## 2021-02-25 DIAGNOSIS — G8929 Other chronic pain: Secondary | ICD-10-CM | POA: Diagnosis not present

## 2021-02-25 DIAGNOSIS — U071 COVID-19: Secondary | ICD-10-CM | POA: Diagnosis not present

## 2021-02-25 DIAGNOSIS — I480 Paroxysmal atrial fibrillation: Secondary | ICD-10-CM

## 2021-02-25 DIAGNOSIS — E119 Type 2 diabetes mellitus without complications: Secondary | ICD-10-CM | POA: Diagnosis not present

## 2021-02-25 DIAGNOSIS — M5136 Other intervertebral disc degeneration, lumbar region: Secondary | ICD-10-CM | POA: Diagnosis not present

## 2021-02-25 DIAGNOSIS — M1612 Unilateral primary osteoarthritis, left hip: Secondary | ICD-10-CM | POA: Diagnosis not present

## 2021-02-25 DIAGNOSIS — M5416 Radiculopathy, lumbar region: Secondary | ICD-10-CM | POA: Diagnosis not present

## 2021-02-25 MED ORDER — BISOPROLOL FUMARATE 5 MG PO TABS: 5.0000 mg | ORAL_TABLET | Freq: Every day | ORAL | 3 refills | Status: DC

## 2021-02-25 NOTE — Patient Instructions (Addendum)
Medication Instructions:  Please start bisoprolol 5 mg once daily  Check price of pradaxa (blood thinner)  If you need a refill on your cardiac medications before your next appointment, please call your pharmacy.   Lab work: No new labs needed  Testing/Procedures: No new testing needed  Follow-Up: At [facility], you and your health needs are our priority.  As part of our continuing mission to provide you with exceptional heart care, we have created designated Provider Care Teams.  These Care Teams include your primary Cardiologist (physician) and Advanced Practice Providers (APPs -  Physician Assistants and Nurse Practitioners) who all work together to provide you with the care you need, when you need it.  You will need a follow up appointment in 6 months  Providers on your designated Care Team:   Murray Hodgkins, NP Christell Faith, PA-C Cadence Kathlen Mody, [place]  COVID-19 Vaccine Information can be found at: [email or web address] For questions related to vaccine distribution or appointments, please email [email or web address] or call [phone].

## 2021-02-28 DIAGNOSIS — J449 Chronic obstructive pulmonary disease, unspecified: Secondary | ICD-10-CM | POA: Diagnosis not present

## 2021-02-28 DIAGNOSIS — Z794 Long term (current) use of insulin: Secondary | ICD-10-CM | POA: Diagnosis not present

## 2021-02-28 DIAGNOSIS — E785 Hyperlipidemia, unspecified: Secondary | ICD-10-CM | POA: Diagnosis not present

## 2021-02-28 DIAGNOSIS — D649 Anemia, unspecified: Secondary | ICD-10-CM | POA: Diagnosis not present

## 2021-02-28 DIAGNOSIS — I10 Essential (primary) hypertension: Secondary | ICD-10-CM | POA: Diagnosis not present

## 2021-02-28 DIAGNOSIS — E119 Type 2 diabetes mellitus without complications: Secondary | ICD-10-CM | POA: Diagnosis not present

## 2021-02-28 DIAGNOSIS — Z0000 Encounter for general adult medical examination without abnormal findings: Secondary | ICD-10-CM | POA: Diagnosis not present

## 2021-03-06 ENCOUNTER — Other Ambulatory Visit: Payer: Self-pay

## 2021-03-06 ENCOUNTER — Ambulatory Visit
Admission: RE | Admit: 2021-03-06 | Discharge: 2021-03-06 | Disposition: A | Discharge status: Home / Self Care | Payer: Medicare HMO | Source: Ambulatory Visit | Attending: Family Medicine | Admitting: Family Medicine

## 2021-03-06 DIAGNOSIS — M5416 Radiculopathy, lumbar region: Secondary | ICD-10-CM

## 2021-03-07 DIAGNOSIS — Z7901 Long term (current) use of anticoagulants: Secondary | ICD-10-CM | POA: Diagnosis not present

## 2021-03-07 DIAGNOSIS — D649 Anemia, unspecified: Secondary | ICD-10-CM | POA: Diagnosis not present

## 2021-03-08 DIAGNOSIS — M48062 Spinal stenosis, lumbar region with neurogenic claudication: Secondary | ICD-10-CM | POA: Diagnosis not present

## 2021-03-08 DIAGNOSIS — M5416 Radiculopathy, lumbar region: Secondary | ICD-10-CM | POA: Diagnosis not present

## 2021-03-18 DIAGNOSIS — E118 Type 2 diabetes mellitus with unspecified complications: Secondary | ICD-10-CM | POA: Diagnosis not present

## 2021-03-20 ENCOUNTER — Other Ambulatory Visit: Payer: Self-pay

## 2021-03-20 ENCOUNTER — Ambulatory Visit
Admission: RE | Admit: 2021-03-20 | Discharge: 2021-03-20 | Disposition: A | Discharge status: Home / Self Care | Payer: Medicare HMO | Source: Ambulatory Visit | Attending: Family Medicine | Admitting: Family Medicine

## 2021-03-20 DIAGNOSIS — M545 Low back pain, unspecified: Secondary | ICD-10-CM | POA: Diagnosis not present

## 2021-03-20 DIAGNOSIS — M2578 Osteophyte, vertebrae: Secondary | ICD-10-CM | POA: Diagnosis not present

## 2021-03-20 DIAGNOSIS — M5416 Radiculopathy, lumbar region: Secondary | ICD-10-CM | POA: Insufficient documentation

## 2021-03-20 DIAGNOSIS — R0602 Shortness of breath: Secondary | ICD-10-CM | POA: Diagnosis not present

## 2021-03-20 DIAGNOSIS — Z01818 Encounter for other preprocedural examination: Secondary | ICD-10-CM | POA: Diagnosis not present

## 2021-03-20 DIAGNOSIS — M47816 Spondylosis without myelopathy or radiculopathy, lumbar region: Secondary | ICD-10-CM | POA: Diagnosis not present

## 2021-03-25 DIAGNOSIS — Z7901 Long term (current) use of anticoagulants: Secondary | ICD-10-CM | POA: Diagnosis not present

## 2021-03-25 DIAGNOSIS — M5416 Radiculopathy, lumbar region: Secondary | ICD-10-CM | POA: Diagnosis not present

## 2021-03-25 DIAGNOSIS — M5136 Other intervertebral disc degeneration, lumbar region: Secondary | ICD-10-CM | POA: Diagnosis not present

## 2021-03-25 DIAGNOSIS — M48062 Spinal stenosis, lumbar region with neurogenic claudication: Secondary | ICD-10-CM | POA: Diagnosis not present

## 2021-03-25 DIAGNOSIS — G8929 Other chronic pain: Secondary | ICD-10-CM | POA: Diagnosis not present

## 2021-03-25 DIAGNOSIS — R1032 Left lower quadrant pain: Secondary | ICD-10-CM | POA: Diagnosis not present

## 2021-03-25 DIAGNOSIS — M1612 Unilateral primary osteoarthritis, left hip: Secondary | ICD-10-CM | POA: Diagnosis not present

## 2021-03-25 DIAGNOSIS — M545 Low back pain, unspecified: Secondary | ICD-10-CM | POA: Diagnosis not present

## 2021-03-28 DIAGNOSIS — Z7901 Long term (current) use of anticoagulants: Secondary | ICD-10-CM | POA: Diagnosis not present

## 2021-03-29 DIAGNOSIS — M48062 Spinal stenosis, lumbar region with neurogenic claudication: Secondary | ICD-10-CM | POA: Diagnosis not present

## 2021-03-29 DIAGNOSIS — M5416 Radiculopathy, lumbar region: Secondary | ICD-10-CM | POA: Diagnosis not present

## 2021-04-02 DIAGNOSIS — D649 Anemia, unspecified: Secondary | ICD-10-CM | POA: Diagnosis not present

## 2021-04-02 DIAGNOSIS — K2270 Barrett's esophagus without dysplasia: Secondary | ICD-10-CM | POA: Diagnosis not present

## 2021-04-10 DIAGNOSIS — J849 Interstitial pulmonary disease, unspecified: Secondary | ICD-10-CM | POA: Diagnosis not present

## 2021-04-10 DIAGNOSIS — C577 Malignant neoplasm of other specified female genital organs: Secondary | ICD-10-CM | POA: Diagnosis not present

## 2021-04-10 DIAGNOSIS — Z08 Encounter for follow-up examination after completed treatment for malignant neoplasm: Secondary | ICD-10-CM | POA: Diagnosis not present

## 2021-04-10 DIAGNOSIS — J439 Emphysema, unspecified: Secondary | ICD-10-CM | POA: Diagnosis not present

## 2021-04-10 DIAGNOSIS — Z90722 Acquired absence of ovaries, bilateral: Secondary | ICD-10-CM | POA: Diagnosis not present

## 2021-04-10 DIAGNOSIS — Z90710 Acquired absence of both cervix and uterus: Secondary | ICD-10-CM | POA: Diagnosis not present

## 2021-04-10 DIAGNOSIS — E119 Type 2 diabetes mellitus without complications: Secondary | ICD-10-CM | POA: Diagnosis not present

## 2021-04-10 DIAGNOSIS — Z7984 Long term (current) use of oral hypoglycemic drugs: Secondary | ICD-10-CM | POA: Diagnosis not present

## 2021-04-10 DIAGNOSIS — Z8543 Personal history of malignant neoplasm of ovary: Secondary | ICD-10-CM | POA: Diagnosis not present

## 2021-04-10 DIAGNOSIS — Z6833 Body mass index (BMI) 33.0-33.9, adult: Secondary | ICD-10-CM | POA: Diagnosis not present

## 2021-04-10 DIAGNOSIS — E7800 Pure hypercholesterolemia, unspecified: Secondary | ICD-10-CM | POA: Diagnosis not present

## 2021-04-10 DIAGNOSIS — J9601 Acute respiratory failure with hypoxia: Secondary | ICD-10-CM | POA: Diagnosis not present

## 2021-04-10 DIAGNOSIS — Z9221 Personal history of antineoplastic chemotherapy: Secondary | ICD-10-CM | POA: Diagnosis not present

## 2021-04-10 DIAGNOSIS — C579 Malignant neoplasm of female genital organ, unspecified: Secondary | ICD-10-CM | POA: Diagnosis not present

## 2021-04-10 DIAGNOSIS — C569 Malignant neoplasm of unspecified ovary: Secondary | ICD-10-CM | POA: Diagnosis not present

## 2021-04-10 DIAGNOSIS — I2699 Other pulmonary embolism without acute cor pulmonale: Secondary | ICD-10-CM | POA: Diagnosis not present

## 2021-04-10 DIAGNOSIS — U099 Post covid-19 condition, unspecified: Secondary | ICD-10-CM | POA: Diagnosis not present

## 2021-04-10 DIAGNOSIS — I10 Essential (primary) hypertension: Secondary | ICD-10-CM | POA: Diagnosis not present

## 2021-04-19 DIAGNOSIS — E119 Type 2 diabetes mellitus without complications: Secondary | ICD-10-CM | POA: Diagnosis not present

## 2021-04-19 DIAGNOSIS — Z1231 Encounter for screening mammogram for malignant neoplasm of breast: Secondary | ICD-10-CM | POA: Diagnosis not present

## 2021-04-19 DIAGNOSIS — E7800 Pure hypercholesterolemia, unspecified: Secondary | ICD-10-CM | POA: Diagnosis not present

## 2021-04-19 DIAGNOSIS — I2699 Other pulmonary embolism without acute cor pulmonale: Secondary | ICD-10-CM | POA: Diagnosis not present

## 2021-04-19 DIAGNOSIS — Z79899 Other long term (current) drug therapy: Secondary | ICD-10-CM | POA: Diagnosis not present

## 2021-04-19 DIAGNOSIS — J431 Panlobular emphysema: Secondary | ICD-10-CM | POA: Diagnosis not present

## 2021-04-19 DIAGNOSIS — I10 Essential (primary) hypertension: Secondary | ICD-10-CM | POA: Diagnosis not present

## 2021-04-19 DIAGNOSIS — E118 Type 2 diabetes mellitus with unspecified complications: Secondary | ICD-10-CM | POA: Diagnosis not present

## 2021-04-22 DIAGNOSIS — M5136 Other intervertebral disc degeneration, lumbar region: Secondary | ICD-10-CM | POA: Diagnosis not present

## 2021-04-22 DIAGNOSIS — M5416 Radiculopathy, lumbar region: Secondary | ICD-10-CM | POA: Diagnosis not present

## 2021-04-22 DIAGNOSIS — M1612 Unilateral primary osteoarthritis, left hip: Secondary | ICD-10-CM | POA: Diagnosis not present

## 2021-04-22 DIAGNOSIS — R791 Abnormal coagulation profile: Secondary | ICD-10-CM | POA: Insufficient documentation

## 2021-04-22 DIAGNOSIS — M48062 Spinal stenosis, lumbar region with neurogenic claudication: Secondary | ICD-10-CM | POA: Diagnosis not present

## 2021-04-23 DIAGNOSIS — M8588 Other specified disorders of bone density and structure, other site: Secondary | ICD-10-CM | POA: Diagnosis not present

## 2021-04-29 ENCOUNTER — Other Ambulatory Visit: Payer: Self-pay | Admitting: Internal Medicine

## 2021-04-29 DIAGNOSIS — Z1231 Encounter for screening mammogram for malignant neoplasm of breast: Secondary | ICD-10-CM

## 2021-05-01 DIAGNOSIS — I2699 Other pulmonary embolism without acute cor pulmonale: Secondary | ICD-10-CM | POA: Diagnosis not present

## 2021-05-01 DIAGNOSIS — Z79899 Other long term (current) drug therapy: Secondary | ICD-10-CM | POA: Diagnosis not present

## 2021-05-08 DIAGNOSIS — Z79899 Other long term (current) drug therapy: Secondary | ICD-10-CM | POA: Diagnosis not present

## 2021-05-08 DIAGNOSIS — I2699 Other pulmonary embolism without acute cor pulmonale: Secondary | ICD-10-CM | POA: Diagnosis not present

## 2021-05-13 DIAGNOSIS — C569 Malignant neoplasm of unspecified ovary: Secondary | ICD-10-CM | POA: Diagnosis not present

## 2021-05-22 DIAGNOSIS — Z79899 Other long term (current) drug therapy: Secondary | ICD-10-CM | POA: Diagnosis not present

## 2021-05-22 DIAGNOSIS — I2699 Other pulmonary embolism without acute cor pulmonale: Secondary | ICD-10-CM | POA: Diagnosis not present

## 2021-06-10 ENCOUNTER — Ambulatory Visit
Admission: RE | Admit: 2021-06-10 | Discharge: 2021-06-10 | Disposition: A | Discharge status: Home / Self Care | Payer: Medicare HMO | Source: Ambulatory Visit | Attending: Internal Medicine | Admitting: Internal Medicine

## 2021-06-10 DIAGNOSIS — Z1231 Encounter for screening mammogram for malignant neoplasm of breast: Secondary | ICD-10-CM | POA: Insufficient documentation

## 2021-06-24 DIAGNOSIS — I2699 Other pulmonary embolism without acute cor pulmonale: Secondary | ICD-10-CM | POA: Diagnosis not present

## 2021-06-24 DIAGNOSIS — N1831 Chronic kidney disease, stage 3a: Secondary | ICD-10-CM | POA: Diagnosis not present

## 2021-06-24 DIAGNOSIS — Z794 Long term (current) use of insulin: Secondary | ICD-10-CM | POA: Diagnosis not present

## 2021-06-24 DIAGNOSIS — Z79899 Other long term (current) drug therapy: Secondary | ICD-10-CM | POA: Diagnosis not present

## 2021-06-24 DIAGNOSIS — E1122 Type 2 diabetes mellitus with diabetic chronic kidney disease: Secondary | ICD-10-CM | POA: Diagnosis not present

## 2021-06-27 ENCOUNTER — Encounter: Payer: Self-pay | Admitting: *Deleted

## 2021-06-28 ENCOUNTER — Ambulatory Visit: Payer: Medicare HMO | Admitting: Anesthesiology

## 2021-06-28 ENCOUNTER — Ambulatory Visit
Admission: RE | Admit: 2021-06-28 | Discharge: 2021-06-28 | Disposition: A | Discharge status: Home / Self Care | Payer: Medicare HMO | Attending: Gastroenterology | Admitting: Gastroenterology

## 2021-06-28 ENCOUNTER — Encounter
Admission: RE | Disposition: A | Discharge status: Home / Self Care | Payer: Self-pay | Source: Home / Self Care | Attending: Gastroenterology

## 2021-06-28 DIAGNOSIS — E119 Type 2 diabetes mellitus without complications: Secondary | ICD-10-CM | POA: Diagnosis not present

## 2021-06-28 DIAGNOSIS — J449 Chronic obstructive pulmonary disease, unspecified: Secondary | ICD-10-CM | POA: Diagnosis not present

## 2021-06-28 DIAGNOSIS — Z8543 Personal history of malignant neoplasm of ovary: Secondary | ICD-10-CM | POA: Insufficient documentation

## 2021-06-28 DIAGNOSIS — Z86711 Personal history of pulmonary embolism: Secondary | ICD-10-CM | POA: Insufficient documentation

## 2021-06-28 DIAGNOSIS — K2270 Barrett's esophagus without dysplasia: Secondary | ICD-10-CM | POA: Diagnosis not present

## 2021-06-28 DIAGNOSIS — Z7984 Long term (current) use of oral hypoglycemic drugs: Secondary | ICD-10-CM | POA: Diagnosis not present

## 2021-06-28 DIAGNOSIS — I480 Paroxysmal atrial fibrillation: Secondary | ICD-10-CM | POA: Insufficient documentation

## 2021-06-28 DIAGNOSIS — Z7985 Long-term (current) use of injectable non-insulin antidiabetic drugs: Secondary | ICD-10-CM | POA: Insufficient documentation

## 2021-06-28 DIAGNOSIS — Z800 Family history of malignant neoplasm of digestive organs: Secondary | ICD-10-CM | POA: Insufficient documentation

## 2021-06-28 DIAGNOSIS — Z6833 Body mass index (BMI) 33.0-33.9, adult: Secondary | ICD-10-CM | POA: Insufficient documentation

## 2021-06-28 DIAGNOSIS — K219 Gastro-esophageal reflux disease without esophagitis: Secondary | ICD-10-CM | POA: Diagnosis not present

## 2021-06-28 DIAGNOSIS — Z87891 Personal history of nicotine dependence: Secondary | ICD-10-CM | POA: Diagnosis not present

## 2021-06-28 DIAGNOSIS — I10 Essential (primary) hypertension: Secondary | ICD-10-CM | POA: Diagnosis not present

## 2021-06-28 HISTORY — DX: Other pulmonary embolism without acute cor pulmonale: I26.99

## 2021-06-28 HISTORY — PX: ESOPHAGOGASTRODUODENOSCOPY (EGD) WITH PROPOFOL: SHX5813

## 2021-06-28 HISTORY — DX: Fistula of vagina to large intestine: N82.3

## 2021-06-28 HISTORY — DX: Fatty (change of) liver, not elsewhere classified: K76.0

## 2021-06-28 LAB — GLUCOSE, CAPILLARY: Glucose-Capillary: 134 mg/dL — ABNORMAL HIGH (ref 70–99)

## 2021-06-28 SURGERY — ESOPHAGOGASTRODUODENOSCOPY (EGD) WITH PROPOFOL
Anesthesia: General

## 2021-06-28 MED ORDER — PROPOFOL 10 MG/ML IV BOLUS
INTRAVENOUS | Status: DC | PRN
  Administered 2021-06-28: 50 mg via INTRAVENOUS

## 2021-06-28 MED ORDER — EPHEDRINE SULFATE (PRESSORS) 50 MG/ML IJ SOLN
INTRAMUSCULAR | Status: DC | PRN
Start: 2021-06-28 — End: 2021-06-28
  Administered 2021-06-28: 5 mg via INTRAVENOUS

## 2021-06-28 MED ORDER — LIDOCAINE HCL (CARDIAC) PF 100 MG/5ML IV SOSY
PREFILLED_SYRINGE | INTRAVENOUS | Status: DC | PRN
  Administered 2021-06-28: 40 mg via INTRAVENOUS

## 2021-06-28 MED ORDER — SODIUM CHLORIDE 0.9 % IV SOLN
INTRAVENOUS | Status: DC
  Administered 2021-06-28: 20 mL/h via INTRAVENOUS

## 2021-06-28 MED ORDER — PROPOFOL 500 MG/50ML IV EMUL
INTRAVENOUS | Status: DC | PRN
  Administered 2021-06-28: 125 ug/kg/min via INTRAVENOUS

## 2021-06-28 NOTE — Transfer of Care (Signed)
Immediate Anesthesia Transfer of Care Note  Patient: Kayla Wu  Procedure(s) Performed: ESOPHAGOGASTRODUODENOSCOPY (EGD) WITH PROPOFOL  Patient Location: PACU  Anesthesia Type:General  Level of Consciousness: awake, alert  and oriented  Airway & Oxygen Therapy: Patient Spontanous Breathing and Patient connected to nasal cannula oxygen  Post-op Assessment: Report given to RN and Post -op Vital signs reviewed and stable  Post vital signs: Reviewed and stable  Last Vitals:  Vitals Value Taken Time  BP    Temp    Pulse    Resp    SpO2      Last Pain:  Vitals:   06/28/21 1156  TempSrc: Temporal  PainSc: 0-No pain         Complications: No notable events documented.

## 2021-06-28 NOTE — Anesthesia Preprocedure Evaluation (Addendum)
Anesthesia Evaluation  Patient identified by MRN, date of birth, ID band Patient awake    Reviewed: Allergy & Precautions, NPO status , Patient's Chart, lab work & pertinent test results  History of Anesthesia Complications Negative for: history of anesthetic complications  Airway Mallampati: III   Neck ROM: Full    Dental no notable dental hx.    Pulmonary COPD, former smoker (quit 2009),    Pulmonary exam normal breath sounds clear to auscultation       Cardiovascular hypertension, Normal cardiovascular exam+ dysrhythmias (a fib on warfarin)  Rhythm:Regular Rate:Normal  Hx PE 2022  ECG 02/25/21: normal  Echo 09/26/20:  1. The left ventricle is normal in size with normal wall thickness.  2. The left ventricular systolic function is normal, LVEF is visually estimated at 60-65%.  3. The right ventricle is normal in size, with normal systolic function; no evidence of right heart strain.    Neuro/Psych negative neurological ROS     GI/Hepatic negative GI ROS,   Endo/Other  diabetes, Type 2Class 3 obesity  Renal/GU negative Renal ROS     Musculoskeletal   Abdominal   Peds  Hematology negative hematology ROS (+)   Anesthesia Other Findings   Reproductive/Obstetrics Ovarian CA                            Anesthesia Physical Anesthesia Plan  ASA: 3  Anesthesia Plan: General   Post-op Pain Management:    Induction: Intravenous  PONV Risk Score and Plan: 3 and Propofol infusion, TIVA and Treatment may vary due to age or medical condition  Airway Management Planned: Natural Airway  Additional Equipment:   Intra-op Plan:   Post-operative Plan:   Informed Consent: I have reviewed the patients History and Physical, chart, labs and discussed the procedure including the risks, benefits and alternatives for the proposed anesthesia with the patient or authorized representative who has  indicated his/her understanding and acceptance.       Plan Discussed with: CRNA  Anesthesia Plan Comments: (LMA/GETA backup discussed.  Patient consented for risks of anesthesia including but not limited to:  - adverse reactions to medications - damage to eyes, teeth, lips or other oral mucosa - nerve damage due to positioning  - sore throat or hoarseness - damage to heart, brain, nerves, lungs, other parts of body or loss of life  Informed patient about role of CRNA in peri- and intra-operative care.  Patient voiced understanding.)        Anesthesia Quick Evaluation

## 2021-06-28 NOTE — Interval H&P Note (Signed)
History and Physical Interval Note:  06/28/2021 11:34 AM  Kayla Wu  has presented today for surgery, with the diagnosis of BARRET;S ESOPHAGUS,ANEMIA.  The various methods of treatment have been discussed with the patient and family. After consideration of risks, benefits and other options for treatment, the patient has consented to  Procedure(s) with comments: ESOPHAGOGASTRODUODENOSCOPY (EGD) WITH PROPOFOL (N/A) - DM, ON WARFARIN as a surgical intervention.  The patient's history has been reviewed, patient examined, no change in status, stable for surgery.  I have reviewed the patient's chart and labs.  Questions were answered to the patient's satisfaction.     [name]  Ok to proceed with EGD

## 2021-06-28 NOTE — Op Note (Signed)
[facility] Patient Name: Kayla Wu Procedure Date: 06/28/2021 11:35 AM MRN: [id number] Account #: [id number] Date of Birth: [birth year] Admit Type: Outpatient Age: 76 Room: [facility] ENDO ROOM 3 Gender: Female Note Status: Finalized Instrument Name: Altamese Cabal Endoscope [id number] Procedure:             Upper GI endoscopy Indications:           Gastro-esophageal reflux disease, Barrett's esophagus Providers:             Andrey Farmer MD, MD Referring MD:          Leonie Douglas. Doy Hutching, MD (Referring MD) Medicines:             Monitored Anesthesia Care Complications:         No immediate complications. Estimated blood loss:                         Minimal. Procedure:             Pre-Anesthesia Assessment:                        - Prior to the procedure, a History and Physical was                         performed, and patient medications and allergies were                         reviewed. The patient is competent. The risks and                         benefits of the procedure and the sedation options and                         risks were discussed with the patient. All questions                         were answered and informed consent was obtained.                         Patient identification and proposed procedure were                         verified by the physician, the nurse, the                         anesthesiologist, the anesthetist and the technician                         in the endoscopy suite. Mental Status Examination:                         alert and oriented. Airway Examination: normal                         oropharyngeal airway and neck mobility. Respiratory                         Examination: clear to auscultation. CV Examination:  normal. Prophylactic Antibiotics: The patient does not                         require prophylactic antibiotics. Prior                         Anticoagulants: The patient has  taken Coumadin                         (warfarin), last dose was 5 days prior to procedure.                         ASA Grade Assessment: III - A patient with severe                         systemic disease. After reviewing the risks and                         benefits, the patient was deemed in satisfactory                         condition to undergo the procedure. The anesthesia                         plan was to use monitored anesthesia care (MAC).                         Immediately prior to administration of medications,                         the patient was re-assessed for adequacy to receive                         sedatives. The heart rate, respiratory rate, oxygen                         saturations, blood pressure, adequacy of pulmonary                         ventilation, and response to care were monitored                         throughout the procedure. The physical status of the                         patient was re-assessed after the procedure.                        After obtaining informed consent, the endoscope was                         passed under direct vision. Throughout the procedure,                         the patient's blood pressure, pulse, and oxygen                         saturations were monitored continuously. The Endoscope  was introduced through the mouth, and advanced to the                         second part of duodenum. The upper GI endoscopy was                         accomplished without difficulty. The patient tolerated                         the procedure well. Findings:      The esophagus and gastroesophageal junction were examined with white       light and narrow band imaging (NBI). There were esophageal mucosal       changes classified as Barrett's stage C3-M4 per Prague criteria. These       changes involved the mucosa at the upper extent of the gastric folds (36       cm from the incisors) extending to the  Z-line (32 cm from the incisors).       Circumferential salmon-colored mucosa was present from 31 to 36 cm and       one tongue of salmon-colored mucosa was present from 31 to 32 cm. The       maximum longitudinal extent of these esophageal mucosal changes was 4 cm       in length. Mucosa was biopsied with a cold forceps for histology in 4       quadrants at intervals of 2 cm in the lower third of the esophagus. A       total of 3 specimen bottles were sent to pathology. Estimated blood loss       was minimal.      The entire examined stomach was normal.      The examined duodenum was normal. Impression:            - Esophageal mucosal changes classified as Barrett's                         stage C3-M4 per Prague criteria. Biopsied.                        - Normal stomach.                        - Normal examined duodenum. Recommendation:        - Discharge patient to home.                        - Resume previous diet.                        - Resume Coumadin (warfarin) at prior dose today.                        - Await pathology results.                        - Return to referring physician as previously                         scheduled. Procedure Code(s):     --- Professional ---  26712, Esophagogastroduodenoscopy, flexible,                         transoral; with biopsy, single or multiple Diagnosis Code(s):     --- Professional ---                        K22.70, Barrett's esophagus without dysplasia                        K21.9, Gastro-esophageal reflux disease without                         esophagitis CPT copyright 2019 American Medical Association. All rights reserved. The codes documented in this report are preliminary and upon coder review may  be revised to meet current compliance requirements. Andrey Farmer MD, MD 06/28/2021 11:56:48 AM Number of Addenda: 0 Note Initiated On: 06/28/2021 11:35 AM Estimated Blood Loss:  Estimated blood loss was  minimal.      [facility]

## 2021-06-28 NOTE — H&P (Signed)
Outpatient short stay form Pre-procedure 06/28/2021  [name], MD  Primary Physician: Idelle Crouch, MD  Reason for visit:  BE's  History of present illness:    76 y/o lady with ovarian cancer, obesity, DM II, and history of PE on coumadin with last dose 5 days ago here for EGD to assess BE's. Brother had esophageal cancer. No neck surgeries. Per EGD at [facility] in 2020 she has long-segment BE without dysplasia.    Current Facility-Administered Medications:    0.9 %  sodium chloride infusion, , Intravenous, Continuous, Donney Caraveo, Hilton Cork, MD, Last Rate: 20 mL/hr at 06/28/21 1046, 20 mL/hr at 06/28/21 1046  Medications Prior to Admission  Medication Sig Dispense Refill Last Dose   atorvastatin (LIPITOR) 20 MG tablet Take 1 tablet by mouth daily.   06/27/2021   bisoprolol (ZEBETA) 5 MG tablet Take 1 tablet (5 mg total) by mouth daily. 90 tablet 3 06/27/2021   diltiazem (CARDIZEM CD) 180 MG 24 hr capsule Take 1 capsule (180 mg total) by mouth daily. 90 capsule 3 06/27/2021   ferrous sulfate 325 (65 FE) MG tablet Take 325 mg by mouth daily with breakfast.   Past Week   insulin NPH-regular Human (70-30) 100 UNIT/ML injection Inject into the skin as needed.   Past Week   LORazepam (ATIVAN) 1 MG tablet Take 1 mg by mouth 2 (two) times daily as needed for anxiety or sleep.   06/27/2021   losartan (COZAAR) 100 MG tablet Take 100 mg by mouth daily.   06/27/2021   magnesium oxide (MAG-OX) 400 MG tablet Take 400 mg by mouth 2 (two) times daily.   06/27/2021   metFORMIN (GLUCOPHAGE-XR) 500 MG 24 hr tablet Take 1,500 mg by mouth daily with supper.   06/27/2021   omeprazole (PRILOSEC) 40 MG capsule Take 1 capsule by mouth daily.   6/64/4034   TRULICITY 1.5 VQ/2.5ZD SOPN Inject 1.5 mg into the skin once a week.   Past Week   venlafaxine XR (EFFEXOR-XR) 150 MG 24 hr capsule Take 150 mg by mouth daily. (take with 37.'5mg'$  capsule to equal 187.'5mg'$  total)   06/27/2021   venlafaxine XR (EFFEXOR-XR) 37.5 MG  24 hr capsule Take 37.5 mg by mouth daily. (take with '150mg'$  capsule to equal 187.'5mg'$  total)   06/27/2021   vitamin B-12 (CYANOCOBALAMIN) 1000 MCG tablet Take 1,000 mcg by mouth daily.   06/27/2021   warfarin (COUMADIN) 5 MG tablet Take 5 mg by mouth daily.   Past Week   gabapentin (NEURONTIN) 600 MG tablet Take 600 mg by mouth at bedtime.        Allergies  Allergen Reactions   Iodinated Contrast Media Hives, Shortness Of Breath and Itching    Patient states she had itching, hives, and SOB after injection. She states she was given Benadryl before and after injection and still had reaction.     Past Medical History:  Diagnosis Date   Atrial fibrillation with RVR ([place]) 06/29/2020   Chronic urticaria    COPD (chronic obstructive pulmonary disease) ([place])    COVID-19 virus infection 06/2020   DDD (degenerative disc disease)    Diabetes mellitus (HCC)    Diastolic dysfunction    a. 01/2018 Echo: Nl EF. Triv AI/MR/PR. Mild TR; b. 06/2020 Echo: EF 60-65%, no rwma, gr1 DD. Nl RV size/fxn. RVSP 14.71mHg.   GERD (gastroesophageal reflux disease)    Hepatic steatosis    Hypertension    Menopausal state    Morbid obesity ([place]  Non-cardiac chest pain    a. 07/2014 MV: EF 75%, no ischemia/artifact.   Ovarian cancer ([place]) 2012   s/p chemo    PAF (paroxysmal atrial fibrillation) ([place])    a. Dx 06/2020 in setting of COVID infection; b. CHA2DS2VASc = 5-->eliquis.   Personal history of chemotherapy 2012   ovarian   Pneumonia 1987   Pulmonary embolus ([place])    Rectovaginal fistula     Review of systems:  Otherwise negative.    Physical Exam  Gen: Alert, oriented. Appears stated age.  HEENT: PERRLA. Lungs: No respiratory distress CV: RRR Abd: soft, benign, no masses Ext: No edema    Planned procedures: Proceed with EGD. The patient understands the nature of the planned procedure, indications, risks, alternatives and potential complications including but not limited to bleeding,  infection, perforation, damage to internal organs and possible oversedation/side effects from anesthesia. The patient agrees and gives consent to proceed.  Please refer to procedure notes for findings, recommendations and patient disposition/instructions.     [name], MD [facility]astro[facility]

## 2021-06-28 NOTE — Anesthesia Postprocedure Evaluation (Signed)
Anesthesia Post Note  Patient: Kayla Wu  Procedure(s) Performed: ESOPHAGOGASTRODUODENOSCOPY (EGD) WITH PROPOFOL  Patient location during evaluation: PACU Anesthesia Type: General Level of consciousness: awake and alert, oriented and patient cooperative Pain management: pain level controlled Vital Signs Assessment: post-procedure vital signs reviewed and stable Respiratory status: spontaneous breathing, nonlabored ventilation and respiratory function stable Cardiovascular status: blood pressure returned to baseline and stable Postop Assessment: adequate PO intake Anesthetic complications: no   No notable events documented.   Last Vitals:  Vitals:   06/28/21 1206 06/28/21 1216  BP: 137/66 (!) 153/63  Pulse: 61 (!) 59  Resp: 14 14  Temp:    SpO2: 92% 93%    Last Pain:  Vitals:   06/28/21 1216  TempSrc:   PainSc: 0-No pain                 Darrin Nipper

## 2021-07-02 ENCOUNTER — Encounter: Payer: Self-pay | Admitting: Gastroenterology

## 2021-07-02 LAB — SURGICAL PATHOLOGY

## 2021-07-04 DIAGNOSIS — E1159 Type 2 diabetes mellitus with other circulatory complications: Secondary | ICD-10-CM | POA: Diagnosis not present

## 2021-07-04 DIAGNOSIS — E669 Obesity, unspecified: Secondary | ICD-10-CM | POA: Diagnosis not present

## 2021-07-04 DIAGNOSIS — E1129 Type 2 diabetes mellitus with other diabetic kidney complication: Secondary | ICD-10-CM | POA: Diagnosis not present

## 2021-07-04 DIAGNOSIS — R809 Proteinuria, unspecified: Secondary | ICD-10-CM | POA: Diagnosis not present

## 2021-07-04 DIAGNOSIS — N1831 Chronic kidney disease, stage 3a: Secondary | ICD-10-CM | POA: Diagnosis not present

## 2021-07-04 DIAGNOSIS — Z794 Long term (current) use of insulin: Secondary | ICD-10-CM | POA: Diagnosis not present

## 2021-07-04 DIAGNOSIS — E1169 Type 2 diabetes mellitus with other specified complication: Secondary | ICD-10-CM | POA: Diagnosis not present

## 2021-07-04 DIAGNOSIS — E1122 Type 2 diabetes mellitus with diabetic chronic kidney disease: Secondary | ICD-10-CM | POA: Diagnosis not present

## 2021-07-05 DIAGNOSIS — I2699 Other pulmonary embolism without acute cor pulmonale: Secondary | ICD-10-CM | POA: Diagnosis not present

## 2021-07-05 DIAGNOSIS — Z79899 Other long term (current) drug therapy: Secondary | ICD-10-CM | POA: Diagnosis not present

## 2021-07-11 DIAGNOSIS — C569 Malignant neoplasm of unspecified ovary: Secondary | ICD-10-CM | POA: Diagnosis not present

## 2021-07-18 ENCOUNTER — Other Ambulatory Visit: Payer: Self-pay | Admitting: Pulmonary Disease

## 2021-07-18 DIAGNOSIS — I2699 Other pulmonary embolism without acute cor pulmonale: Secondary | ICD-10-CM

## 2021-07-19 ENCOUNTER — Ambulatory Visit
Admission: RE | Admit: 2021-07-19 | Discharge: 2021-07-19 | Disposition: A | Discharge status: Home / Self Care | Payer: Medicare HMO | Source: Ambulatory Visit | Attending: Pulmonary Disease | Admitting: Pulmonary Disease

## 2021-07-19 DIAGNOSIS — I2699 Other pulmonary embolism without acute cor pulmonale: Secondary | ICD-10-CM | POA: Insufficient documentation

## 2021-07-19 DIAGNOSIS — J984 Other disorders of lung: Secondary | ICD-10-CM | POA: Diagnosis not present

## 2021-07-19 DIAGNOSIS — R918 Other nonspecific abnormal finding of lung field: Secondary | ICD-10-CM | POA: Diagnosis not present

## 2021-07-19 MED ORDER — IOHEXOL 350 MG/ML SOLN
75.0000 mL | Freq: Once | INTRAVENOUS | Status: AC | PRN
  Administered 2021-07-19: 75 mL via INTRAVENOUS

## 2021-07-24 DIAGNOSIS — Z0000 Encounter for general adult medical examination without abnormal findings: Secondary | ICD-10-CM | POA: Diagnosis not present

## 2021-07-24 DIAGNOSIS — Z794 Long term (current) use of insulin: Secondary | ICD-10-CM | POA: Diagnosis not present

## 2021-07-24 DIAGNOSIS — E7800 Pure hypercholesterolemia, unspecified: Secondary | ICD-10-CM | POA: Diagnosis not present

## 2021-07-24 DIAGNOSIS — E669 Obesity, unspecified: Secondary | ICD-10-CM | POA: Diagnosis not present

## 2021-07-24 DIAGNOSIS — I10 Essential (primary) hypertension: Secondary | ICD-10-CM | POA: Diagnosis not present

## 2021-07-24 DIAGNOSIS — E118 Type 2 diabetes mellitus with unspecified complications: Secondary | ICD-10-CM | POA: Diagnosis not present

## 2021-07-24 DIAGNOSIS — I2699 Other pulmonary embolism without acute cor pulmonale: Secondary | ICD-10-CM | POA: Diagnosis not present

## 2021-07-24 DIAGNOSIS — Z79899 Other long term (current) drug therapy: Secondary | ICD-10-CM | POA: Diagnosis not present

## 2021-08-01 DIAGNOSIS — J449 Chronic obstructive pulmonary disease, unspecified: Secondary | ICD-10-CM | POA: Diagnosis not present

## 2021-08-16 ENCOUNTER — Encounter: Payer: Self-pay | Admitting: Cardiovascular Disease

## 2021-08-19 ENCOUNTER — Other Ambulatory Visit: Payer: Self-pay

## 2021-08-19 MED ORDER — DILTIAZEM HCL ER COATED BEADS 180 MG PO CP24: 180.0000 mg | ORAL_CAPSULE | Freq: Every day | ORAL | 0 refills | Status: DC

## 2021-08-23 DIAGNOSIS — E7800 Pure hypercholesterolemia, unspecified: Secondary | ICD-10-CM | POA: Diagnosis not present

## 2021-08-23 DIAGNOSIS — I10 Essential (primary) hypertension: Secondary | ICD-10-CM | POA: Diagnosis not present

## 2021-08-23 DIAGNOSIS — Z79899 Other long term (current) drug therapy: Secondary | ICD-10-CM | POA: Diagnosis not present

## 2021-08-23 DIAGNOSIS — E118 Type 2 diabetes mellitus with unspecified complications: Secondary | ICD-10-CM | POA: Diagnosis not present

## 2021-08-29 DIAGNOSIS — E118 Type 2 diabetes mellitus with unspecified complications: Secondary | ICD-10-CM | POA: Diagnosis not present

## 2021-08-29 DIAGNOSIS — Z79899 Other long term (current) drug therapy: Secondary | ICD-10-CM | POA: Diagnosis not present

## 2021-08-29 DIAGNOSIS — J431 Panlobular emphysema: Secondary | ICD-10-CM | POA: Diagnosis not present

## 2021-08-29 DIAGNOSIS — E7800 Pure hypercholesterolemia, unspecified: Secondary | ICD-10-CM | POA: Diagnosis not present

## 2021-08-29 DIAGNOSIS — I10 Essential (primary) hypertension: Secondary | ICD-10-CM | POA: Diagnosis not present

## 2021-08-29 DIAGNOSIS — D649 Anemia, unspecified: Secondary | ICD-10-CM | POA: Diagnosis not present

## 2021-09-02 DIAGNOSIS — E118 Type 2 diabetes mellitus with unspecified complications: Secondary | ICD-10-CM | POA: Diagnosis not present

## 2021-09-02 DIAGNOSIS — Z794 Long term (current) use of insulin: Secondary | ICD-10-CM | POA: Diagnosis not present

## 2021-09-02 NOTE — Progress Notes (Unsigned)
Cardiology Office Note  Date:  09/03/2021   ID:  Kayla Wu, DOB 09-19-1945, MRN [id number]  PCP:  Idelle Crouch, MD   Chief Complaint  Patient presents with   6 month follow up     Patient c/o shortness of breath with exertion. Medications reviewed by the patient verbally.     HPI:  76 year old female with a history of  remote tobacco abuse (42 pack years),  COPD, followed by pulmonary diabetes,  hypertension,  hyperlipidemia, GERD, obesity, and ovarian cancer,  COVID infection May 2022 Aortic atherosclerosis on CT scan who presents for follow-up after recent hospitalization for COVID-19 and paroxysmal atrial fibrillation.  LOV 02/2021 In follow-up today reports she continues to have shortness of breath, chest tightness Followed by pulmonary, recent chest CTA showing coronary calcification No PE  Reports that she is off warfarin, stopped it after she learned there was no PE on chest CT scan Unable to afford NOACs  Legs cramping at rest, etiology unclear On lipitor  Sedentary, shopping Consider pulmonary rehab Followed by   CT chest: June 2023 scattered coronary artery calcifications.  EKG personally reviewed by myself on todays visit NSr rate 59 bpm no significant ST-T wave  Prior events, developed atrial fibrillation during COVID May 2022 Propranolol previously held by pulmonary Has made full recovery Maintained on warfarin, managed by [facility]  Echocardiogram May 2022 normal ejection fraction 60% normal right heart pressures  In follow-up today Main complaint is chronic low back pain, knee pain Sedentary, unable to exercise Presents today in a wheelchair Scheduled to receive cortisone shots next week  history of noncardiac chest pain with stress testing in 2016, which was normal.  Echocardiogram in 2019 showed normal LV function.    admitted to [facility] on Jun 25 2020 after being found to be hypoxic and COVID-positive at urgent care.    treated with remdesivir, steroids, zinc, nebulizers, and vitamin C.   slow improvement in respiratory status.    May 27, just 1 day prior to discharge, she developed atrial fibrillation with rapid ventricular response and rates in the 160s.    asymptomatic.   converted to sinus rhythm with PACs within 3 hours.   recommendation to initiate Eliquis and oral diltiazem.    Echocardiogram  06/2020 showed an EF of 60 to 65% without regional wall motion abnormalities, grade 1 diastolic dysfunction, and no significant valvular disease.     PMH:   has a past medical history of Atrial fibrillation with RVR ([name]) (06/29/2020), Chronic urticaria, COPD (chronic obstructive pulmonary disease) ([place]), COVID-19 virus infection (06/2020), DDD (degenerative disc disease), Diabetes mellitus ([place]), Diastolic dysfunction, GERD (gastroesophageal reflux disease), Hepatic steatosis, Hypertension, Menopausal state, Morbid obesity ([name]), Non-cardiac chest pain, Ovarian cancer ([name]) (2012), PAF (paroxysmal atrial fibrillation) ([name]), Personal history of chemotherapy (2012), Pneumonia (1987), Pulmonary embolus ([place]), and Rectovaginal fistula.  PSH:    Past Surgical History:  Procedure Laterality Date   ABDOMINAL HYSTERECTOMY     APPENDECTOMY     CHOLECYSTECTOMY     ESOPHAGOGASTRODUODENOSCOPY (EGD) WITH PROPOFOL N/A 06/28/2021   Procedure: ESOPHAGOGASTRODUODENOSCOPY (EGD) WITH PROPOFOL;  Surgeon: [name], MD;  Location: [facility] ENDOSCOPY;  Service: Endoscopy;  Laterality: N/A;  DM, ON WARFARIN   FINGER ARTHROPLASTY Left 12/22/2014   Procedure: FINGER ARTHROPLASTY;  Surgeon: [name], MD;  Location: [facility] ORS;  Service: Orthopedics;  Laterality: Left;   hysterectomy      Current Outpatient Medications  Medication Sig Dispense Refill   albuterol (VENTOLIN HFA) 108 (  90 Base) MCG/ACT inhaler Inhale into the lungs.     aspirin EC 81 MG tablet Take by mouth.     atorvastatin (LIPITOR) 20 MG tablet Take 1  tablet by mouth daily.     bisoprolol (ZEBETA) 5 MG tablet Take 1 tablet (5 mg total) by mouth daily. 90 tablet 3   diltiazem (CARDIZEM CD) 180 MG 24 hr capsule Take 1 capsule (180 mg total) by mouth daily. 90 capsule 0   ferrous sulfate 325 (65 FE) MG tablet Take 325 mg by mouth daily with breakfast.     Fluticasone-Umeclidin-Vilant (TRELEGY ELLIPTA) 100-62.5-25 MCG/ACT AEPB Inhale into the lungs.     gabapentin (NEURONTIN) 600 MG tablet Take 600 mg by mouth at bedtime.     hydrOXYzine (ATARAX) 50 MG tablet      LORazepam (ATIVAN) 1 MG tablet Take 1 mg by mouth 2 (two) times daily as needed for anxiety or sleep.     losartan (COZAAR) 100 MG tablet Take 100 mg by mouth daily.     magnesium oxide (MAG-OX) 400 MG tablet Take 400 mg by mouth 2 (two) times daily.     metFORMIN (GLUCOPHAGE-XR) 500 MG 24 hr tablet Take 1,500 mg by mouth daily with supper.     omeprazole (PRILOSEC) 40 MG capsule Take 1 capsule by mouth daily.     TRULICITY 1.5 WU/1.3KG SOPN Inject 1.5 mg into the skin once a week.     venlafaxine XR (EFFEXOR-XR) 150 MG 24 hr capsule Take 150 mg by mouth daily. (take with 37.'5mg'$  capsule to equal 187.'5mg'$  total)     venlafaxine XR (EFFEXOR-XR) 37.5 MG 24 hr capsule Take 37.5 mg by mouth daily. (take with '150mg'$  capsule to equal 187.'5mg'$  total)     vitamin B-12 (CYANOCOBALAMIN) 1000 MCG tablet Take 1,000 mcg by mouth daily.     insulin NPH-regular Human (70-30) 100 UNIT/ML injection Inject into the skin as needed. (Patient not taking: Reported on 09/03/2021)     No current facility-administered medications for this visit.    Allergies:   Iodinated contrast media   Social History:  The patient  reports that she quit smoking about 14 years ago. Her smoking use included cigarettes. She has a 84.00 pack-year smoking history. She has never used smokeless tobacco. She reports that she does not drink alcohol and does not use drugs.   Family History:   family history includes Breast cancer (age of  onset: 93) in her mother; Colon cancer in her sister; Emphysema in her father; Throat cancer in her brother; Uterine cancer in her sister.    Review of Systems: Review of Systems  Constitutional: Negative.   HENT: Negative.    Respiratory: Negative.    Cardiovascular: Negative.   Gastrointestinal: Negative.   Musculoskeletal: Negative.   Neurological: Negative.   Psychiatric/Behavioral: Negative.    All other systems reviewed and are negative.    PHYSICAL EXAM: VS:  BP 120/60 (BP Location: Left Arm, Patient Position: Sitting, Cuff Size: Normal)   Pulse (!) 59   Ht '5\' 4"'$  (1.626 m)   Wt 196 lb 8 oz (89.1 kg)   SpO2 94%   BMI 33.73 kg/m  , BMI Body mass index is 33.73 kg/m. GEN: Well nourished, well developed, in no acute distress HEENT: normal Neck: no JVD, carotid bruits, or masses Cardiac: RRR; no murmurs, rubs, or gallops,no edema  Respiratory:  clear to auscultation bilaterally, normal work of breathing GI: soft, nontender, nondistended, + BS MS: no deformity or atrophy  Skin: warm and dry, no rash Neuro:  Strength and sensation are intact Psych: euthymic mood, full affect   Recent Labs: 10/09/2020: ALT 20; BUN 17; Creatinine, Ser 0.83; Hemoglobin 11.9; Platelets 440; Potassium 4.1; Sodium 133    Lipid Panel Lab Results  Component Value Date   TRIG 110 06/25/2020      Wt Readings from Last 3 Encounters:  09/03/21 196 lb 8 oz (89.1 kg)  06/28/21 195 lb (88.5 kg)  02/25/21 197 lb 8 oz (89.6 kg)     ASSESSMENT AND PLAN:  Problem List Items Addressed This Visit       Cardiology Problems   Benign essential HTN   Relevant Medications   aspirin EC 81 MG tablet   Other Relevant Orders   EKG 12-Lead   HLD (hyperlipidemia)   Relevant Medications   aspirin EC 81 MG tablet     Other   Acute hypoxemic respiratory failure due to COVID-19 (HCC)   Diabetes mellitus (HCC)   Relevant Medications   aspirin EC 81 MG tablet   Other Relevant Orders   EKG 12-Lead    Other Visit Diagnoses     Paroxysmal atrial fibrillation (HCC)    -  Primary   Relevant Medications   aspirin EC 81 MG tablet   Other Relevant Orders   EKG 12-Lead     Paroxysmal atrial fibrillation Prior episode of atrial fibrillation in the setting of COVID Discharged from the hospital on Eliquis, Was changed to warfarin Reports that she took herself off warfarin as she does not have a PE on CT scan  Severe emphysema/obstructive sleep apnea overlap Prior history COVID-19, hospitalized May 2022 High probability PE on VQ scan, was treated with warfarin Recent CT scan June 2023, no PE  When last seen by pulmonary August 01, 2021 it was recommended she stay on warfarin.  On today's visit she reports she is not on warfarin, she indicates that she stopped as there was no PE on CT scan  Shortness of breath, angina, chest tightness Long smoking history coronary calcification on chest CT scan She is concerned about blockages , we have recommended cardiac CTA for further evaluation  Diabetes type 2 We have encouraged continued exercise, careful diet management in an effort to lose weight.  Aortic atherosclerosis Seen on CT scan, cholesterol at goal May be having statin myalgias, having leg cramps.  May need a vacation from the Lipitor for several weeks   Total encounter time more than 30 minutes  Greater than 50% was spent in counseling and coordination of care with the patient    Signed, Esmond Plants, M.D., Ph.D. [place], [place]

## 2021-09-03 ENCOUNTER — Ambulatory Visit: Payer: Medicare HMO | Admitting: Cardiovascular Disease

## 2021-09-03 ENCOUNTER — Encounter: Payer: Self-pay | Admitting: Cardiovascular Disease

## 2021-09-03 VITALS — BP 120/60 | HR 59 | Ht 64.0 in | Wt 196.5 lb

## 2021-09-03 DIAGNOSIS — E119 Type 2 diabetes mellitus without complications: Secondary | ICD-10-CM

## 2021-09-03 DIAGNOSIS — J9601 Acute respiratory failure with hypoxia: Secondary | ICD-10-CM | POA: Diagnosis not present

## 2021-09-03 DIAGNOSIS — R072 Precordial pain: Secondary | ICD-10-CM | POA: Diagnosis not present

## 2021-09-03 DIAGNOSIS — I480 Paroxysmal atrial fibrillation: Secondary | ICD-10-CM

## 2021-09-03 DIAGNOSIS — I10 Essential (primary) hypertension: Secondary | ICD-10-CM

## 2021-09-03 DIAGNOSIS — U071 COVID-19: Secondary | ICD-10-CM | POA: Diagnosis not present

## 2021-09-03 DIAGNOSIS — E782 Mixed hyperlipidemia: Secondary | ICD-10-CM | POA: Diagnosis not present

## 2021-09-03 MED ORDER — METOPROLOL TARTRATE 25 MG PO TABS: 25.0000 mg | ORAL_TABLET | Freq: Once | ORAL | 0 refills | Status: DC

## 2021-09-03 MED ORDER — DIPHENHYDRAMINE HCL 50 MG PO CAPS: ORAL_CAPSULE | ORAL | 0 refills | Status: DC

## 2021-09-03 MED ORDER — PREDNISONE 50 MG PO TABS: ORAL_TABLET | ORAL | 0 refills | Status: DC

## 2021-09-03 NOTE — Patient Instructions (Addendum)
Medication Instructions:  Hold the atorvastatin for a few weeks See if leg cramps get better If better call the office   If you need a refill on your cardiac medications before your next appointment, please call your pharmacy.   Lab work: This week: [place] Entrance at [facility] 1st desk on the right to check in (REGISTRATION)  Lab hours: Monday- Friday (7:30 am- 5:30 pm)   Testing/Procedures: Your physician has requested that you have cardiac CT. Cardiac computed tomography (CT) is a painless test that uses an x-ray machine to take clear, detailed pictures of your heart.    Your cardiac CT has been scheduled for Monday, August 7th at 11am at:  [facility] [place], [place] [address]  Please arrive 15 mins early for check-in and test prep.    Please follow these instructions carefully (unless otherwise directed):   On the Night Before the Test: Be sure to Drink plenty of water. Do not consume any caffeinated/decaffeinated beverages or chocolate 12 hours prior to your test. If the patient has contrast allergy: Patient will need a prescription for Prednisone and very clear instructions (as follows): Prednisone 50 mg - take 13 hours prior to test Take another Prednisone 50 mg 7 hours prior to test Take another Prednisone 50 mg 1 hour prior to test Take Benadryl 50 mg 1 hour prior to test Patient must complete all four doses of above prophylactic medications. Patient will need a ride after test due to Benadryl.  On the Day of the Test: Drink plenty of water until 1 hour prior to the test. Do not eat any food 4 hours prior to the test. You may take your regular medications prior to the test.  Take metoprolol (Lopressor) '25mg'$  (1 tablet) two hours prior to test. This has been sent into your pharmacy [facility]- please wear underwire-free bra if available, avoid dresses & tight clothing       After the Test: Drink plenty of  water. After receiving IV contrast, you may experience a mild flushed feeling. This is normal. On occasion, you may experience a mild rash up to 24 hours after the test. This is not dangerous. If this occurs, you can take Benadryl 25 mg and increase your fluid intake. If you experience trouble breathing, this can be serious. If it is severe call 911 IMMEDIATELY. If it is mild, please call our office. If you take any of these medications: Glipizide/Metformin, Avandament, Glucavance, please do not take 48 hours after completing test unless otherwise instructed.  Please allow 2-4 weeks for scheduling of routine cardiac CTs. Some insurance companies require a pre-authorization which may delay scheduling of this test.   For non-scheduling related questions, please contact the cardiac imaging nurse navigator should you have any questions/concerns: Marchia Bond, Cardiac Imaging Nurse Navigator Gordy Clement, Cardiac Imaging Nurse Navigator [facility] Direct Office Dial: [phone]   For scheduling needs, including cancellations and rescheduling, please call [place], [phone].    Follow-Up: At [facility], you and your health needs are our priority.  As part of our continuing mission to provide you with exceptional heart care, we have created designated Provider Care Teams.  These Care Teams include your primary Cardiologist (physician) and Advanced Practice Providers (APPs -  Physician Assistants and Nurse Practitioners) who all work together to provide you with the care you need, when you need it.  You will need a follow up appointment in 12 months  Providers on your designated Care Team:   Murray Hodgkins, NP Christell Faith, PA-C Cadence Kathlen Mody, [place]  COVID-19 Vaccine Information can be found at: [email or web address].uk For questions related to vaccine distribution or appointments, please email  [email or web address] or call [phone].

## 2021-09-05 ENCOUNTER — Other Ambulatory Visit
Admission: RE | Admit: 2021-09-05 | Discharge: 2021-09-05 | Disposition: A | Discharge status: Home / Self Care | Payer: Medicare HMO | Attending: Cardiovascular Disease | Admitting: Cardiovascular Disease

## 2021-09-05 DIAGNOSIS — I480 Paroxysmal atrial fibrillation: Secondary | ICD-10-CM | POA: Insufficient documentation

## 2021-09-05 LAB — BASIC METABOLIC PANEL
Anion gap: 9 (ref 5–15)
BUN: 18 mg/dL (ref 8–23)
CO2: 21 mmol/L — ABNORMAL LOW (ref 22–32)
Calcium: 9 mg/dL (ref 8.9–10.3)
Chloride: 107 mmol/L (ref 98–111)
Creatinine, Ser: 0.89 mg/dL (ref 0.44–1.00)
GFR, Estimated: >60 mL/min (ref >60)
Glucose, Bld: 97 mg/dL (ref 70–99)
Potassium: 4.4 mmol/L (ref 3.5–5.1)
Sodium: 137 mmol/L (ref 135–145)

## 2021-09-06 ENCOUNTER — Telehealth (HOSPITAL_COMMUNITY): Payer: Self-pay | Admitting: *Deleted

## 2021-09-06 NOTE — Telephone Encounter (Signed)
Reaching out to patient to offer assistance regarding upcoming cardiac imaging study; pt verbalizes understanding of appt date/time, parking situation and where to check in, pre-test NPO status and medications ordered, and verified current allergies; name and call back number provided for further questions should they arise  Gordy Clement RN Navigator Cardiac [place] [facility] [phone] office [phone] cell  Reviewed instructions on how to take 13 hour prep and metoprolol tartrate with patient. She verbalized understanding.

## 2021-09-09 ENCOUNTER — Ambulatory Visit: Admission: RE | Admit: 2021-09-09 | Payer: Medicare HMO | Source: Ambulatory Visit

## 2021-09-13 ENCOUNTER — Telehealth (HOSPITAL_COMMUNITY): Payer: Self-pay | Admitting: Emergency Medicine

## 2021-09-13 NOTE — Telephone Encounter (Signed)
Attempted to call patient regarding upcoming cardiac CT appointment. °Left message on voicemail with name and callback number °Shayanna Thatch RN Navigator Cardiac Imaging °[facility] °[phone] Office °[phone] Cell ° °

## 2021-09-16 ENCOUNTER — Ambulatory Visit
Admission: RE | Admit: 2021-09-16 | Discharge: 2021-09-16 | Disposition: A | Discharge status: Home / Self Care | Payer: Medicare HMO | Source: Ambulatory Visit | Attending: Cardiovascular Disease | Admitting: Cardiovascular Disease

## 2021-09-16 ENCOUNTER — Encounter: Payer: Medicare HMO | Attending: Pulmonary Disease | Admitting: *Deleted

## 2021-09-16 ENCOUNTER — Encounter: Payer: Self-pay | Admitting: *Deleted

## 2021-09-16 DIAGNOSIS — R072 Precordial pain: Secondary | ICD-10-CM | POA: Diagnosis not present

## 2021-09-16 DIAGNOSIS — J849 Interstitial pulmonary disease, unspecified: Secondary | ICD-10-CM

## 2021-09-16 DIAGNOSIS — R0600 Dyspnea, unspecified: Secondary | ICD-10-CM

## 2021-09-16 DIAGNOSIS — U099 Post covid-19 condition, unspecified: Secondary | ICD-10-CM

## 2021-09-16 MED ORDER — IOHEXOL 350 MG/ML SOLN
75.0000 mL | Freq: Once | INTRAVENOUS | Status: AC | PRN
  Administered 2021-09-16: 75 mL via INTRAVENOUS

## 2021-09-16 MED ORDER — METOPROLOL TARTRATE 5 MG/5ML IV SOLN
INTRAVENOUS | Status: AC
  Administered 2021-09-16: 10 mg via INTRAVENOUS
  Filled 2021-09-16: qty 10

## 2021-09-16 MED ORDER — DILTIAZEM HCL 25 MG/5ML IV SOLN
INTRAVENOUS | Status: AC
  Filled 2021-09-16: qty 5

## 2021-09-16 MED ORDER — NITROGLYCERIN 0.4 MG SL SUBL
SUBLINGUAL_TABLET | SUBLINGUAL | Status: AC
  Filled 2021-09-16: qty 2

## 2021-09-16 MED ORDER — NITROGLYCERIN 0.4 MG SL SUBL
0.8000 mg | SUBLINGUAL_TABLET | Freq: Once | SUBLINGUAL | Status: AC
  Administered 2021-09-16: 0.8 mg via SUBLINGUAL

## 2021-09-16 MED ORDER — METOPROLOL TARTRATE 5 MG/5ML IV SOLN
10.0000 mg | Freq: Once | INTRAVENOUS | Status: AC
  Administered 2021-09-16: 10 mg via INTRAVENOUS

## 2021-09-16 MED ORDER — METOPROLOL TARTRATE 5 MG/5ML IV SOLN
INTRAVENOUS | Status: AC
  Filled 2021-09-16: qty 10

## 2021-09-16 MED ORDER — METOPROLOL TARTRATE 5 MG/5ML IV SOLN
10.0000 mg | Freq: Once | INTRAVENOUS | Status: AC
  Filled 2021-09-16: qty 10

## 2021-09-16 MED ORDER — DILTIAZEM HCL 25 MG/5ML IV SOLN
10.0000 mg | Freq: Once | INTRAVENOUS | Status: AC
  Administered 2021-09-16: 10 mg via INTRAVENOUS

## 2021-09-16 NOTE — Progress Notes (Signed)
Virtual orientation call completed today. shehas an appointment on Date: 09/20/2021  for EP eval and gym Orientation.  Documentation of diagnosis can be found in [facility] Date: 08/01/2021 .

## 2021-09-16 NOTE — Progress Notes (Signed)
Patient tolerated procedure well. Ambulate w/o difficulty. Denies any lightheadedness or being dizzy. Pt denies any pain at this time. Sitting in chair, drinking water provided. P is encouraged to drink additional water throughout the day and reason explained to patient. Patient verbalized understanding and all questions answered. ABC intact. No further needs at this time. Discharge from procedure area w/o issues.  °

## 2021-09-18 VITALS — Ht 64.5 in | Wt 196.4 lb

## 2021-09-18 DIAGNOSIS — R0600 Dyspnea, unspecified: Secondary | ICD-10-CM | POA: Diagnosis not present

## 2021-09-18 DIAGNOSIS — U099 Post covid-19 condition, unspecified: Secondary | ICD-10-CM

## 2021-09-18 DIAGNOSIS — J849 Interstitial pulmonary disease, unspecified: Secondary | ICD-10-CM

## 2021-09-18 NOTE — Progress Notes (Signed)
Pulmonary Individual Treatment Plan  Patient Details  Name: Kayla Wu MRN: [id number] Date of Birth: [birth year] Referring Provider:   Flowsheet Row Pulmonary Rehab from 09/18/2021 in [facility] Cardiac and Pulmonary Rehab  Referring Provider Lanney Gins       Initial Encounter Date:  Flowsheet Row Pulmonary Rehab from 09/18/2021 in [facility] Cardiac and Pulmonary Rehab  Date 09/18/21       Visit Diagnosis: ILD (interstitial lung disease) ([place])  Post covid-19 condition, unspecified  Dyspnea, unspecified type  Patient's Home Medications on Admission:  Current Outpatient Medications:    albuterol (VENTOLIN HFA) 108 (90 Base) MCG/ACT inhaler, Inhale into the lungs., Disp: , Rfl:    aspirin EC 81 MG tablet, Take by mouth., Disp: , Rfl:    atorvastatin (LIPITOR) 20 MG tablet, Take 1 tablet by mouth daily., Disp: , Rfl:    bisoprolol (ZEBETA) 5 MG tablet, Take 1 tablet (5 mg total) by mouth daily., Disp: 90 tablet, Rfl: 3   diltiazem (CARDIZEM CD) 180 MG 24 hr capsule, Take 1 capsule (180 mg total) by mouth daily., Disp: 90 capsule, Rfl: 0   diphenhydrAMINE (BENADRYL) 50 MG capsule, Take 1 capsule 1 hour prior to procedure, Disp: 1 capsule, Rfl: 0   ferrous sulfate 325 (65 FE) MG tablet, Take 325 mg by mouth daily with breakfast., Disp: , Rfl:    Fluticasone-Umeclidin-Vilant (TRELEGY ELLIPTA) 100-62.5-25 MCG/ACT AEPB, Inhale into the lungs., Disp: , Rfl:    gabapentin (NEURONTIN) 600 MG tablet, Take 600 mg by mouth at bedtime., Disp: , Rfl:    hydrOXYzine (ATARAX) 50 MG tablet, , Disp: , Rfl:    insulin NPH-regular Human (70-30) 100 UNIT/ML injection, Inject into the skin as needed. (Patient not taking: Reported on 09/03/2021), Disp: , Rfl:    LORazepam (ATIVAN) 1 MG tablet, Take 1 mg by mouth 2 (two) times daily as needed for anxiety or sleep., Disp: , Rfl:    losartan (COZAAR) 100 MG tablet, Take 100 mg by mouth daily., Disp: , Rfl:    magnesium oxide (MAG-OX) 400 MG tablet, Take 400 mg by  mouth 2 (two) times daily., Disp: , Rfl:    metFORMIN (GLUCOPHAGE-XR) 500 MG 24 hr tablet, Take 1,500 mg by mouth daily with supper., Disp: , Rfl:    metoprolol tartrate (LOPRESSOR) 25 MG tablet, Take 1 tablet (25 mg total) by mouth once for 1 dose. Take 2 hours prior to your CT., Disp: 1 tablet, Rfl: 0   omeprazole (PRILOSEC) 40 MG capsule, Take 1 capsule by mouth daily., Disp: , Rfl:    predniSONE (DELTASONE) 50 MG tablet, Take 50 mg 13 hours, 7 hour, and 1 hour prior to procedure, Disp: 3 tablet, Rfl: 0   TRULICITY 1.5 WU/[id number] SOPN, Inject 1.5 mg into the skin once a week., Disp: , Rfl:    venlafaxine XR (EFFEXOR-XR) 150 MG 24 hr capsule, Take 150 mg by mouth daily. (take with 37.'5mg'$  capsule to equal 187.'5mg'$  total), Disp: , Rfl:    venlafaxine XR (EFFEXOR-XR) 37.5 MG 24 hr capsule, Take 37.5 mg by mouth daily. (take with '150mg'$  capsule to equal 187.'5mg'$  total), Disp: , Rfl:    vitamin B-12 (CYANOCOBALAMIN) 1000 MCG tablet, Take 1,000 mcg by mouth daily., Disp: , Rfl:   Past Medical History: Past Medical History:  Diagnosis Date   Atrial fibrillation with RVR ([place]) 06/29/2020   Chronic urticaria    COPD (chronic obstructive pulmonary disease) ([place])    COVID-19 virus infection 06/2020   DDD (degenerative disc disease)  Diabetes mellitus ([place])    Diastolic dysfunction    a. 01/2018 Echo: Nl EF. Triv AI/MR/PR. Mild TR; b. 06/2020 Echo: EF 60-65%, no rwma, gr1 DD. Nl RV size/fxn. RVSP 14.40mHg.   GERD (gastroesophageal reflux disease)    Hepatic steatosis    Hypertension    Menopausal state    Morbid obesity ([place]    Non-cardiac chest pain    a. 07/2014 MV: EF 75%, no ischemia/artifact.   Ovarian cancer ([place] 2012   s/p chemo    PAF (paroxysmal atrial fibrillation) ([place]    a. Dx 06/2020 in setting of COVID infection; b. CHA2DS2VASc = 5-->eliquis.   Personal history of chemotherapy 2012   ovarian   Pneumonia 1987   Pulmonary embolus ([place]    Rectovaginal fistula     Tobacco  Use: Social History   Tobacco Use  Smoking Status Former   Packs/day: 2.00   Years: 42.00   Total pack years: 84.00   Types: Cigarettes   Quit date: 07/05/2007   Years since quitting: 14.2  Smokeless Tobacco Never    Labs: Review Flowsheet       Latest Ref Rng & Units 01/19/2020 06/25/2020 06/26/2020  Labs for ITP Cardiac and Pulmonary Rehab  Trlycerides <150 mg/dL - 110  -  Hemoglobin A1c 4.8 - 5.6 % 6.8  - 7.4      Pulmonary Assessment Scores:  Pulmonary Assessment Scores     Row Name 09/18/21 1537         ADL UCSD   ADL Phase Entry     SOB Score total 76     Rest 1     Walk 2     Stairs 5     Bath 2     Dress 1     Shop 5       CAT Score   CAT Score 21       mMRC Score   mMRC Score 2              UCSD: Self-administered rating of dyspnea associated with activities of daily living (ADLs) 6-point scale (0 = "not at all" to 5 = "maximal or unable to do because of breathlessness")  Scoring Scores range from 0 to 120.  Minimally important difference is 5 units  CAT: CAT can identify the health impairment of COPD patients and is better correlated with disease progression.  CAT has a scoring range of zero to 40. The CAT score is classified into four groups of low (less than 10), medium (10 - 20), high (21-30) and very high (31-40) based on the impact level of disease on health status. A CAT score over 10 suggests significant symptoms.  A worsening CAT score could be explained by an exacerbation, poor medication adherence, poor inhaler technique, or progression of COPD or comorbid conditions.  CAT MCID is 2 points  mMRC: mMRC (Modified Medical Research Council) Dyspnea Scale is used to assess the degree of baseline functional disability in patients of respiratory disease due to dyspnea. No minimal important difference is established. A decrease in score of 1 point or greater is considered a positive change.   Pulmonary Function Assessment:   Exercise  Target Goals: Exercise Program Goal: Individual exercise prescription set using results from initial 6 min walk test and THRR while considering  patient's activity barriers and safety.   Exercise Prescription Goal: Initial exercise prescription builds to 30-45 minutes a day of aerobic activity, 2-3 days per week.  Home exercise guidelines  will be given to patient during program as part of exercise prescription that the participant will acknowledge.  Education: Aerobic Exercise: - Group verbal and visual presentation on the components of exercise prescription. Introduces F.I.T.T principle from ACSM for exercise prescriptions.  Reviews F.I.T.T. principles of aerobic exercise including progression. Written material given at graduation. Flowsheet Row Pulmonary Rehab from 09/18/2021 in [facility] Cardiac and Pulmonary Rehab  Education need identified 09/18/21       Education: Resistance Exercise: - Group verbal and visual presentation on the components of exercise prescription. Introduces F.I.T.T principle from ACSM for exercise prescriptions  Reviews F.I.T.T. principles of resistance exercise including progression. Written material given at graduation.    Education: Exercise & Equipment Safety: - Individual verbal instruction and demonstration of equipment use and safety with use of the equipment. Flowsheet Row Pulmonary Rehab from 09/18/2021 in [facility] Cardiac and Pulmonary Rehab  Date 09/18/21  Educator NT  Instruction Review Code 1- Verbalizes Understanding       Education: Exercise Physiology & General Exercise Guidelines: - Group verbal and written instruction with models to review the exercise physiology of the cardiovascular system and associated critical values. Provides general exercise guidelines with specific guidelines to those with heart or lung disease.  Flowsheet Row Pulmonary Rehab from 09/18/2021 in [facility] Cardiac and Pulmonary Rehab  Education need identified 09/18/21        Education: Flexibility, Balance, Mind/Body Relaxation: - Group verbal and visual presentation with interactive activity on the components of exercise prescription. Introduces F.I.T.T principle from ACSM for exercise prescriptions. Reviews F.I.T.T. principles of flexibility and balance exercise training including progression. Also discusses the mind body connection.  Reviews various relaxation techniques to help reduce and manage stress (i.e. Deep breathing, progressive muscle relaxation, and visualization). Balance handout provided to take home. Written material given at graduation.   Activity Barriers & Risk Stratification:  Activity Barriers & Cardiac Risk Stratification - 09/18/21 1516       Activity Barriers & Cardiac Risk Stratification   Activity Barriers Shortness of Breath             6 Minute Walk:  6 Minute Walk     Row Name 09/18/21 1510         6 Minute Walk   Phase Initial     Distance 950 feet     Walk Time 5.53 minutes     # of Rest Breaks 2     MPH 1.95     METS 1.55     RPE 13     Perceived Dyspnea  3     VO2 Peak 5.44     Symptoms Yes (comment)     Comments SOB     Resting HR 59 bpm     Resting BP 118/62     Resting Oxygen Saturation  94 %     Exercise Oxygen Saturation  during 6 min walk 88 %     Max Ex. HR 86 bpm     Max Ex. BP 144/72     2 Minute Post BP 132/68       Interval HR   1 Minute HR 84     2 Minute HR 86     3 Minute HR 86     4 Minute HR 85     5 Minute HR 86     6 Minute HR 86     2 Minute Post HR 64     Interval Heart Rate? Yes  Interval Oxygen   Interval Oxygen? Yes     Baseline Oxygen Saturation % 94 %     1 Minute Oxygen Saturation % 91 %     1 Minute Liters of Oxygen 0 L  RA     2 Minute Oxygen Saturation % 88 %     2 Minute Liters of Oxygen 0 L  RA     3 Minute Oxygen Saturation % 90 %     3 Minute Liters of Oxygen 0 L  RA     4 Minute Oxygen Saturation % 92 %     4 Minute Liters of Oxygen 0 L  RA     5  Minute Oxygen Saturation % 91 %     5 Minute Liters of Oxygen 0 L  RA     6 Minute Oxygen Saturation % 90 %     6 Minute Liters of Oxygen 0 L  RA     2 Minute Post Oxygen Saturation % 93 %     2 Minute Post Liters of Oxygen 0 L  RA             Oxygen Initial Assessment:  Oxygen Initial Assessment - 09/16/21 1422       Home Oxygen   Home Oxygen Device None    Sleep Oxygen Prescription None    Home Exercise Oxygen Prescription None    Home Resting Oxygen Prescription None      Initial 6 min Walk   Oxygen Used None      Program Oxygen Prescription   Program Oxygen Prescription None      Intervention   Short Term Goals To learn and demonstrate proper pursed lip breathing techniques or other breathing techniques. ;To learn and demonstrate proper use of respiratory medications;To learn and understand importance of maintaining oxygen saturations>88%    Long  Term Goals Verbalizes importance of monitoring SPO2 with pulse oximeter and return demonstration;Maintenance of O2 saturations>88%;Exhibits proper breathing techniques, such as pursed lip breathing or other method taught during program session;Demonstrates proper use of MDI's;Compliance with respiratory medication             Oxygen Re-Evaluation:   Oxygen Discharge (Final Oxygen Re-Evaluation):   Initial Exercise Prescription:  Initial Exercise Prescription - 09/18/21 1500       Date of Initial Exercise RX and Referring Provider   Date 09/18/21    Referring Provider Aleskerov      Oxygen   Maintain Oxygen Saturation 88% or higher      Treadmill   MPH 1.5    Grade 0    Minutes 15    METs 2.15      NuStep   Level 1    SPM 80    Minutes 15    METs 1.55      T5 Nustep   Level 1    SPM 80    Minutes 15    METs 1.55      Biostep-RELP   Level 1    SPM 60    Minutes 15    METs 1.55      Track   Laps 15    Minutes 15    METs 1.82      Prescription Details   Frequency (times per week) 3     Duration Progress to 30 minutes of continuous aerobic without signs/symptoms of physical distress      Intensity   THRR 40-80% of Max Heartrate 93-127    Ratings  of Perceived Exertion 11-15    Perceived Dyspnea 0-4      Progression   Progression Continue to progress workloads to maintain intensity without signs/symptoms of physical distress.      Resistance Training   Training Prescription Yes    Weight 3 lb    Reps 10-15             Perform Capillary Blood Glucose checks as needed.  Exercise Prescription Changes:   Exercise Prescription Changes     Row Name 09/18/21 1500             Response to Exercise   Blood Pressure (Admit) 118/62       Blood Pressure (Exercise) 144/72       Blood Pressure (Exit) 120/64       Heart Rate (Admit) 59 bpm       Heart Rate (Exercise) 86 bpm       Heart Rate (Exit) 63 bpm       Oxygen Saturation (Admit) 94 %       Oxygen Saturation (Exercise) 88 %       Oxygen Saturation (Exit) 91 %       Rating of Perceived Exertion (Exercise) 13       Perceived Dyspnea (Exercise) 3       Symptoms SOB       Comments Completed 6MWT       Duration Progress to 30 minutes of  aerobic without signs/symptoms of physical distress       Intensity THRR unchanged                Exercise Comments:   Exercise Goals and Review:   Exercise Goals     Row Name 09/18/21 1528             Exercise Goals   Increase Physical Activity Yes       Intervention Provide advice, education, support and counseling about physical activity/exercise needs.;Develop an individualized exercise prescription for aerobic and resistive training based on initial evaluation findings, risk stratification, comorbidities and participant's personal goals.       Expected Outcomes Short Term: Attend rehab on a regular basis to increase amount of physical activity.;Long Term: Add in home exercise to make exercise part of routine and to increase amount of physical  activity.;Long Term: Exercising regularly at least 3-5 days a week.       Increase Strength and Stamina Yes       Intervention Provide advice, education, support and counseling about physical activity/exercise needs.;Develop an individualized exercise prescription for aerobic and resistive training based on initial evaluation findings, risk stratification, comorbidities and participant's personal goals.       Expected Outcomes Short Term: Increase workloads from initial exercise prescription for resistance, speed, and METs.;Long Term: Improve cardiorespiratory fitness, muscular endurance and strength as measured by increased METs and functional capacity (6MWT);Short Term: Perform resistance training exercises routinely during rehab and add in resistance training at home       Able to understand and use rate of perceived exertion (RPE) scale Yes       Intervention Provide education and explanation on how to use RPE scale       Expected Outcomes Short Term: Able to use RPE daily in rehab to express subjective intensity level;Long Term:  Able to use RPE to guide intensity level when exercising independently       Able to understand and use Dyspnea scale Yes       Intervention  Provide education and explanation on how to use Dyspnea scale       Expected Outcomes Short Term: Able to use Dyspnea scale daily in rehab to express subjective sense of shortness of breath during exertion;Long Term: Able to use Dyspnea scale to guide intensity level when exercising independently       Knowledge and understanding of Target Heart Rate Range (THRR) Yes       Intervention Provide education and explanation of THRR including how the numbers were predicted and where they are located for reference       Expected Outcomes Short Term: Able to state/look up THRR;Long Term: Able to use THRR to govern intensity when exercising independently;Short Term: Able to use daily as guideline for intensity in rehab       Able to check  pulse independently Yes       Intervention Review the importance of being able to check your own pulse for safety during independent exercise;Provide education and demonstration on how to check pulse in carotid and radial arteries.       Expected Outcomes Short Term: Able to explain why pulse checking is important during independent exercise;Long Term: Able to check pulse independently and accurately       Understanding of Exercise Prescription Yes       Intervention Provide education, explanation, and written materials on patient's individual exercise prescription       Expected Outcomes Short Term: Able to explain program exercise prescription;Long Term: Able to explain home exercise prescription to exercise independently                Exercise Goals Re-Evaluation :   Discharge Exercise Prescription (Final Exercise Prescription Changes):  Exercise Prescription Changes - 09/18/21 1500       Response to Exercise   Blood Pressure (Admit) 118/62    Blood Pressure (Exercise) 144/72    Blood Pressure (Exit) 120/64    Heart Rate (Admit) 59 bpm    Heart Rate (Exercise) 86 bpm    Heart Rate (Exit) 63 bpm    Oxygen Saturation (Admit) 94 %    Oxygen Saturation (Exercise) 88 %    Oxygen Saturation (Exit) 91 %    Rating of Perceived Exertion (Exercise) 13    Perceived Dyspnea (Exercise) 3    Symptoms SOB    Comments Completed 6MWT    Duration Progress to 30 minutes of  aerobic without signs/symptoms of physical distress    Intensity THRR unchanged             Nutrition:  Target Goals: Understanding of nutrition guidelines, daily intake of sodium '1500mg'$ , cholesterol '200mg'$ , calories 30% from fat and 7% or less from saturated fats, daily to have 5 or more servings of fruits and vegetables.  Education: All About Nutrition: -Group instruction provided by verbal, written material, interactive activities, discussions, models, and posters to present general guidelines for heart healthy  nutrition including fat, fiber, MyPlate, the role of sodium in heart healthy nutrition, utilization of the nutrition label, and utilization of this knowledge for meal planning. Follow up email sent as well. Written material given at graduation.   Biometrics:  Pre Biometrics - 09/18/21 1528       Pre Biometrics   Height 5' 4.5" (1.638 m)    Weight 196 lb 6.4 oz (89.1 kg)    Waist Circumference 41.5 inches    Hip Circumference 45.5 inches    Waist to Hip Ratio 0.91 %    BMI (Calculated) 33.2  Single Leg Stand 2.71 seconds              Nutrition Therapy Plan and Nutrition Goals:  Nutrition Therapy & Goals - 09/18/21 1533       Intervention Plan   Intervention Prescribe, educate and counsel regarding individualized specific dietary modifications aiming towards targeted core components such as weight, hypertension, lipid management, diabetes, heart failure and other comorbidities.    Expected Outcomes Short Term Goal: Understand basic principles of dietary content, such as calories, fat, sodium, cholesterol and nutrients.;Short Term Goal: A plan has been developed with personal nutrition goals set during dietitian appointment.;Long Term Goal: Adherence to prescribed nutrition plan.             Nutrition Assessments:  MEDIFICTS Score Key: ?70 Need to make dietary changes  40-70 Heart Healthy Diet ? 40 Therapeutic Level Cholesterol Diet  Flowsheet Row Pulmonary Rehab from 09/18/2021 in [facility] Cardiac and Pulmonary Rehab  Picture Your Plate Total Score on Admission 48      Picture Your Plate Scores: <64 Unhealthy dietary pattern with much room for improvement. 41-50 Dietary pattern unlikely to meet recommendations for good health and room for improvement. 51-60 More healthful dietary pattern, with some room for improvement.  >60 Healthy dietary pattern, although there may be some specific behaviors that could be improved.   Nutrition Goals Re-Evaluation:   Nutrition  Goals Discharge (Final Nutrition Goals Re-Evaluation):   Psychosocial: Target Goals: Acknowledge presence or absence of significant depression and/or stress, maximize coping skills, provide positive support system. Participant is able to verbalize types and ability to use techniques and skills needed for reducing stress and depression.   Education: Stress, Anxiety, and Depression - Group verbal and visual presentation to define topics covered.  Reviews how body is impacted by stress, anxiety, and depression.  Also discusses healthy ways to reduce stress and to treat/manage anxiety and depression.  Written material given at graduation.   Education: Sleep Hygiene -Provides group verbal and written instruction about how sleep can affect your health.  Define sleep hygiene, discuss sleep cycles and impact of sleep habits. Review good sleep hygiene tips.    Initial Review & Psychosocial Screening:  Initial Psych Review & Screening - 09/16/21 1423       Initial Review   Current issues with Current Depression;Current Anxiety/Panic;Current Psychotropic Meds      Family Dynamics   Good Support System? Yes   Merrilee Seashore, husband     Barriers   Psychosocial barriers to participate in program There are no identifiable barriers or psychosocial needs.      Screening Interventions   Interventions Encouraged to exercise;To provide support and resources with identified psychosocial needs;Provide feedback about the scores to participant    Expected Outcomes Short Term goal: Utilizing psychosocial counselor, staff and physician to assist with identification of specific Stressors or current issues interfering with healing process. Setting desired goal for each stressor or current issue identified.;Long Term Goal: Stressors or current issues are controlled or eliminated.;Short Term goal: Identification and review with participant of any Quality of Life or Depression concerns found by scoring the questionnaire.;Long  Term goal: The participant improves quality of Life and PHQ9 Scores as seen by post scores and/or verbalization of changes             Quality of Life Scores:  Scores of 19 and below usually indicate a poorer quality of life in these areas.  A difference of  2-3 points is a clinically meaningful difference.  A difference of 2-3 points in the total score of the Quality of Life Index has been associated with significant improvement in overall quality of life, self-image, physical symptoms, and general health in studies assessing change in quality of life.  PHQ-9: Review Flowsheet       09/18/2021  Depression screen PHQ 2/9  Decreased Interest 1  Down, Depressed, Hopeless 2  PHQ - 2 Score 3  Altered sleeping 2  Tired, decreased energy 3  Change in appetite 3  Feeling bad or failure about yourself  0  Trouble concentrating 3  Moving slowly or fidgety/restless 1  Suicidal thoughts 2  PHQ-9 Score 17  Difficult doing work/chores Somewhat difficult   Interpretation of Total Score  Total Score Depression Severity:  1-4 = Minimal depression, 5-9 = Mild depression, 10-14 = Moderate depression, 15-19 = Moderately severe depression, 20-27 = Severe depression   Psychosocial Evaluation and Intervention:  Psychosocial Evaluation - 09/16/21 1427       Psychosocial Evaluation & Interventions   Interventions Encouraged to exercise with the program and follow exercise prescription    Comments Kathlee has no barriers to attending the program. She lives with her husband Jolaine Artist is her support. She wants to gain more stamina and decrease her shortness of breath. She is on effoxor fro some depression. She is managing well with her meds. She is ready to get started.    Expected Outcomes STG Jailey is able to attend all scheduled sessions with progess in her exerciseprescription and improvement with her shortness of breath symptoms. [employer] with progression of her exercise after discharge     Continue Psychosocial Services  Follow up required by staff             Psychosocial Re-Evaluation:   Psychosocial Discharge (Final Psychosocial Re-Evaluation):   Education: Education Goals: Education classes will be provided on a weekly basis, covering required topics. Participant will state understanding/return demonstration of topics presented.  Learning Barriers/Preferences:   General Pulmonary Education Topics:  Infection Prevention: - Provides verbal and written material to individual with discussion of infection control including proper hand washing and proper equipment cleaning during exercise session. Flowsheet Row Pulmonary Rehab from 09/18/2021 in [facility] Cardiac and Pulmonary Rehab  Date 09/18/21  Educator NT  Instruction Review Code 1- Verbalizes Understanding       Falls Prevention: - Provides verbal and written material to individual with discussion of falls prevention and safety. Flowsheet Row Pulmonary Rehab from 09/18/2021 in [facility]  Date 09/16/21  Educator SB  Instruction Review Code 1- Verbalizes Understanding       Chronic Lung Disease Review: - Group verbal instruction with posters, models, PowerPoint presentations and videos,  to review new updates, new respiratory medications, new advancements in procedures and treatments. Providing information on websites and "800" numbers for continued self-education. Includes information about supplement oxygen, available portable oxygen systems, continuous and intermittent flow rates, oxygen safety, concentrators, and Medicare reimbursement for oxygen. Explanation of Pulmonary Drugs, including class, frequency, complications, importance of spacers, rinsing mouth after steroid MDI's, and proper cleaning methods for nebulizers. Review of basic lung anatomy and physiology related to function, structure, and complications of lung disease. Review of risk factors. Discussion about methods for  diagnosing sleep apnea and types of masks and machines for OSA. Includes a review of the use of types of environmental controls: home humidity, furnaces, filters, dust mite/pet prevention, HEPA vacuums. Discussion about weather changes, air quality and the benefits of nasal  washing. Instruction on Warning signs, infection symptoms, calling MD promptly, preventive modes, and value of vaccinations. Review of effective airway clearance, coughing and/or vibration techniques. Emphasizing that all should Create an Action Plan. Written material given at graduation. Flowsheet Row Pulmonary Rehab from 09/18/2021 in [facility] and Pulmonary Rehab  Education need identified 09/18/21       AED/CPR: - Group verbal and written instruction with the use of models to demonstrate the basic use of the AED with the basic ABC's of resuscitation.    Anatomy and Cardiac Procedures: - Group verbal and visual presentation and models provide information about basic cardiac anatomy and function. Reviews the testing methods done to diagnose heart disease and the outcomes of the test results. Describes the treatment choices: Medical Management, Angioplasty, or Coronary Bypass Surgery for treating various heart conditions including Myocardial Infarction, Angina, Valve Disease, and Cardiac Arrhythmias.  Written material given at graduation.   Medication Safety: - Group verbal and visual instruction to review commonly prescribed medications for heart and lung disease. Reviews the medication, class of the drug, and side effects. Includes the steps to properly store meds and maintain the prescription regimen.  Written material given at graduation.   Other: -Provides group and verbal instruction on various topics (see comments)   Knowledge Questionnaire Score:  Knowledge Questionnaire Score - 09/18/21 1536       Knowledge Questionnaire Score   Pre Score 14/18              Core Components/Risk Factors/Patient  Goals at Admission:  Personal Goals and Risk Factors at Admission - 09/18/21 1534       Core Components/Risk Factors/Patient Goals on Admission    Weight Management Yes    Intervention Weight Management: Develop a combined nutrition and exercise program designed to reach desired caloric intake, while maintaining appropriate intake of nutrient and fiber, sodium and fats, and appropriate energy expenditure required for the weight goal.;Weight Management/Obesity: Establish reasonable short term and long term weight goals.;Obesity: Provide education and appropriate resources to help participant work on and attain dietary goals.    Admit Weight 196 lb 6.4 oz (89.1 kg)    Goal Weight: Short Term 190 lb (86.2 kg)    Goal Weight: Long Term 185 lb (83.9 kg)    Expected Outcomes Short Term: Continue to assess and modify interventions until short term weight is achieved;Long Term: Adherence to nutrition and physical activity/exercise program aimed toward attainment of established weight goal;Weight Loss: Understanding of general recommendations for a balanced deficit meal plan, which promotes 1-2 lb weight loss per week and includes a negative energy balance of [phone] kcal/d    Improve shortness of breath with ADL's Yes    Intervention Provide education, individualized exercise plan and daily activity instruction to help decrease symptoms of SOB with activities of daily living.    Expected Outcomes Short Term: Improve cardiorespiratory fitness to achieve a reduction of symptoms when performing ADLs;Long Term: Be able to perform more ADLs without symptoms or delay the onset of symptoms    Increase knowledge of respiratory medications and ability to use respiratory devices properly  Yes    Intervention Provide education and demonstration as needed of appropriate use of medications, inhalers, and oxygen therapy.    Expected Outcomes Short Term: Achieves understanding of medications use. Understands that oxygen  is a medication prescribed by physician. Demonstrates appropriate use of inhaler and oxygen therapy.;Long Term: Maintain appropriate use of medications, inhalers, and oxygen therapy.    Diabetes Yes  Intervention Provide education about signs/symptoms and action to take for hypo/hyperglycemia.;Provide education about proper nutrition, including hydration, and aerobic/resistive exercise prescription along with prescribed medications to achieve blood glucose in normal ranges: Fasting glucose 65-99 mg/dL    Expected Outcomes Short Term: Participant verbalizes understanding of the signs/symptoms and immediate care of hyper/hypoglycemia, proper foot care and importance of medication, aerobic/resistive exercise and nutrition plan for blood glucose control.;Long Term: Attainment of HbA1C < 7%.    Hypertension Yes    Intervention Provide education on lifestyle modifcations including regular physical activity/exercise, weight management, moderate sodium restriction and increased consumption of fresh fruit, vegetables, and low fat dairy, alcohol moderation, and smoking cessation.;Monitor prescription use compliance.    Expected Outcomes Short Term: Continued assessment and intervention until BP is < 140/49m HG in hypertensive participants. < 130/831mHG in hypertensive participants with diabetes, heart failure or chronic kidney disease.;Long Term: Maintenance of blood pressure at goal levels.    Lipids Yes    Intervention Provide education and support for participant on nutrition & aerobic/resistive exercise along with prescribed medications to achieve LDL '70mg'$ , HDL >'40mg'$ .    Expected Outcomes Short Term: Participant states understanding of desired cholesterol values and is compliant with medications prescribed. Participant is following exercise prescription and nutrition guidelines.;Long Term: Cholesterol controlled with medications as prescribed, with individualized exercise RX and with personalized nutrition  plan. Value goals: LDL < '70mg'$ , HDL > 40 mg.             Education:Diabetes - Individual verbal and written instruction to review signs/symptoms of diabetes, desired ranges of glucose level fasting, after meals and with exercise. Acknowledge that pre and post exercise glucose checks will be done for 3 sessions at entry of program. [facility]  Date 09/18/21  Educator NT  Instruction Review Code 1- Verbalizes Understanding       Know Your Numbers and Heart Failure: - Group verbal and visual instruction to discuss disease risk factors for cardiac and pulmonary disease and treatment options.  Reviews associated critical values for Overweight/Obesity, Hypertension, Cholesterol, and Diabetes.  Discusses basics of heart failure: signs/symptoms and treatments.  Introduces Heart Failure Zone chart for action plan for heart failure.  Written material given at graduation.   Core Components/Risk Factors/Patient Goals Review:    Core Components/Risk Factors/Patient Goals at Discharge (Final Review):    ITP Comments:  ITP Comments     Row Name 09/16/21 1507 09/18/21 1448         ITP Comments Virtual orientation call completed today. shehas an appointment on Date: 09/20/2021  for EP eval and gym Orientation.  [facility] . Completed 6MWT and gym orientation. Initial ITP created and sent for review to Dr. FrNeomia GlassMedical Director.               Comments: Initial ITP

## 2021-09-18 NOTE — Patient Instructions (Signed)
Patient Instructions  Patient Details  Name: Kayla Wu MRN: [id number] Date of Birth: September 20, 1945 Referring Provider:  Ottie Glazier, MD  Below are your personal goals for exercise, nutrition, and risk factors. Our goal is to help you stay on track towards obtaining and maintaining these goals. We will be discussing your progress on these goals with you throughout the program.  Initial Exercise Prescription:  Initial Exercise Prescription - 09/18/21 1500       Date of Initial Exercise RX and Referring Provider   Date 09/18/21    Referring Provider Aleskerov      Oxygen   Maintain Oxygen Saturation 88% or higher      Treadmill   MPH 1.5    Grade 0    Minutes 15    METs 2.15      NuStep   Level 1    SPM 80    Minutes 15    METs 1.55      T5 Nustep   Level 1    SPM 80    Minutes 15    METs 1.55      Biostep-RELP   Level 1    SPM 60    Minutes 15    METs 1.55      Track   Laps 15    Minutes 15    METs 1.82      Prescription Details   Frequency (times per week) 3    Duration Progress to 30 minutes of continuous aerobic without signs/symptoms of physical distress      Intensity   THRR 40-80% of Max Heartrate 93-127    Ratings of Perceived Exertion 11-15    Perceived Dyspnea 0-4      Progression   Progression Continue to progress workloads to maintain intensity without signs/symptoms of physical distress.      Resistance Training   Training Prescription Yes    Weight 3 lb    Reps 10-15             Exercise Goals: Frequency: Be able to perform aerobic exercise two to three times per week in program working toward 2-5 days per week of home exercise.  Intensity: Work with a perceived exertion of 11 (fairly light) - 15 (hard) while following your exercise prescription.  We will make changes to your prescription with you as you progress through the program.   Duration: Be able to do 30 to 45 minutes of continuous aerobic exercise in addition to  a 5 minute warm-up and a 5 minute cool-down routine.   Nutrition Goals: Your personal nutrition goals will be established when you do your nutrition analysis with the dietician.  The following are general nutrition guidelines to follow: Cholesterol < '200mg'$ /day Sodium < '1500mg'$ /day Fiber: Women over 50 yrs - 21 grams per day  Personal Goals:  Personal Goals and Risk Factors at Admission - 09/18/21 1534       Core Components/Risk Factors/Patient Goals on Admission    Weight Management Yes    Intervention Weight Management: Develop a combined nutrition and exercise program designed to reach desired caloric intake, while maintaining appropriate intake of nutrient and fiber, sodium and fats, and appropriate energy expenditure required for the weight goal.;Weight Management/Obesity: Establish reasonable short term and long term weight goals.;Obesity: Provide education and appropriate resources to help participant work on and attain dietary goals.    Admit Weight 196 lb 6.4 oz (89.1 kg)    Goal Weight: Short Term 190 lb (86.2 kg)  Goal Weight: Long Term 185 lb (83.9 kg)    Expected Outcomes Short Term: Continue to assess and modify interventions until short term weight is achieved;Long Term: Adherence to nutrition and physical activity/exercise program aimed toward attainment of established weight goal;Weight Loss: Understanding of general recommendations for a balanced deficit meal plan, which promotes 1-2 lb weight loss per week and includes a negative energy balance of [phone] kcal/d    Improve shortness of breath with ADL's Yes    Intervention Provide education, individualized exercise plan and daily activity instruction to help decrease symptoms of SOB with activities of daily living.    Expected Outcomes Short Term: Improve cardiorespiratory fitness to achieve a reduction of symptoms when performing ADLs;Long Term: Be able to perform more ADLs without symptoms or delay the onset of symptoms     Increase knowledge of respiratory medications and ability to use respiratory devices properly  Yes    Intervention Provide education and demonstration as needed of appropriate use of medications, inhalers, and oxygen therapy.    Expected Outcomes Short Term: Achieves understanding of medications use. Understands that oxygen is a medication prescribed by physician. Demonstrates appropriate use of inhaler and oxygen therapy.;Long Term: Maintain appropriate use of medications, inhalers, and oxygen therapy.    Diabetes Yes    Intervention Provide education about signs/symptoms and action to take for hypo/hyperglycemia.;Provide education about proper nutrition, including hydration, and aerobic/resistive exercise prescription along with prescribed medications to achieve blood glucose in normal ranges: Fasting glucose 65-99 mg/dL    Expected Outcomes Short Term: Participant verbalizes understanding of the signs/symptoms and immediate care of hyper/hypoglycemia, proper foot care and importance of medication, aerobic/resistive exercise and nutrition plan for blood glucose control.;Long Term: Attainment of HbA1C < 7%.    Hypertension Yes    Intervention Provide education on lifestyle modifcations including regular physical activity/exercise, weight management, moderate sodium restriction and increased consumption of fresh fruit, vegetables, and low fat dairy, alcohol moderation, and smoking cessation.;Monitor prescription use compliance.    Expected Outcomes Short Term: Continued assessment and intervention until BP is < 140/73m HG in hypertensive participants. < 130/841mHG in hypertensive participants with diabetes, heart failure or chronic kidney disease.;Long Term: Maintenance of blood pressure at goal levels.    Lipids Yes    Intervention Provide education and support for participant on nutrition & aerobic/resistive exercise along with prescribed medications to achieve LDL '70mg'$ , HDL >'40mg'$ .    Expected  Outcomes Short Term: Participant states understanding of desired cholesterol values and is compliant with medications prescribed. Participant is following exercise prescription and nutrition guidelines.;Long Term: Cholesterol controlled with medications as prescribed, with individualized exercise RX and with personalized nutrition plan. Value goals: LDL < '70mg'$ , HDL > 40 mg.             Tobacco Use Initial Evaluation: Social History   Tobacco Use  Smoking Status Former   Packs/day: 2.00   Years: 42.00   Total pack years: 84.00   Types: Cigarettes   Quit date: 07/05/2007   Years since quitting: 14.2  Smokeless Tobacco Never    Exercise Goals and Review:  Exercise Goals     Row Name 09/18/21 1528             Exercise Goals   Increase Physical Activity Yes       Intervention Provide advice, education, support and counseling about physical activity/exercise needs.;Develop an individualized exercise prescription for aerobic and resistive training based on initial evaluation findings, risk stratification, comorbidities and participant's personal  goals.       Expected Outcomes Short Term: Attend rehab on a regular basis to increase amount of physical activity.;Long Term: Add in home exercise to make exercise part of routine and to increase amount of physical activity.;Long Term: Exercising regularly at least 3-5 days a week.       Increase Strength and Stamina Yes       Intervention Provide advice, education, support and counseling about physical activity/exercise needs.;Develop an individualized exercise prescription for aerobic and resistive training based on initial evaluation findings, risk stratification, comorbidities and participant's personal goals.       Expected Outcomes Short Term: Increase workloads from initial exercise prescription for resistance, speed, and METs.;Long Term: Improve cardiorespiratory fitness, muscular endurance and strength as measured by increased METs and  functional capacity (6MWT);Short Term: Perform resistance training exercises routinely during rehab and add in resistance training at home       Able to understand and use rate of perceived exertion (RPE) scale Yes       Intervention Provide education and explanation on how to use RPE scale       Expected Outcomes Short Term: Able to use RPE daily in rehab to express subjective intensity level;Long Term:  Able to use RPE to guide intensity level when exercising independently       Able to understand and use Dyspnea scale Yes       Intervention Provide education and explanation on how to use Dyspnea scale       Expected Outcomes Short Term: Able to use Dyspnea scale daily in rehab to express subjective sense of shortness of breath during exertion;Long Term: Able to use Dyspnea scale to guide intensity level when exercising independently       Knowledge and understanding of Target Heart Rate Range (THRR) Yes       Intervention Provide education and explanation of THRR including how the numbers were predicted and where they are located for reference       Expected Outcomes Short Term: Able to state/look up THRR;Long Term: Able to use THRR to govern intensity when exercising independently;Short Term: Able to use daily as guideline for intensity in rehab       Able to check pulse independently Yes       Intervention Review the importance of being able to check your own pulse for safety during independent exercise;Provide education and demonstration on how to check pulse in carotid and radial arteries.       Expected Outcomes Short Term: Able to explain why pulse checking is important during independent exercise;Long Term: Able to check pulse independently and accurately       Understanding of Exercise Prescription Yes       Intervention Provide education, explanation, and written materials on patient's individual exercise prescription       Expected Outcomes Short Term: Able to explain program exercise  prescription;Long Term: Able to explain home exercise prescription to exercise independently                Copy of goals given to participant.

## 2021-09-23 DIAGNOSIS — E669 Obesity, unspecified: Secondary | ICD-10-CM | POA: Diagnosis not present

## 2021-09-23 DIAGNOSIS — E118 Type 2 diabetes mellitus with unspecified complications: Secondary | ICD-10-CM | POA: Diagnosis not present

## 2021-09-23 DIAGNOSIS — E7800 Pure hypercholesterolemia, unspecified: Secondary | ICD-10-CM | POA: Diagnosis not present

## 2021-09-23 DIAGNOSIS — I10 Essential (primary) hypertension: Secondary | ICD-10-CM | POA: Diagnosis not present

## 2021-09-23 DIAGNOSIS — J431 Panlobular emphysema: Secondary | ICD-10-CM | POA: Diagnosis not present

## 2021-10-03 DIAGNOSIS — E118 Type 2 diabetes mellitus with unspecified complications: Secondary | ICD-10-CM | POA: Diagnosis not present

## 2021-10-03 DIAGNOSIS — Z794 Long term (current) use of insulin: Secondary | ICD-10-CM | POA: Diagnosis not present

## 2021-10-09 ENCOUNTER — Encounter: Payer: Self-pay | Admitting: *Deleted

## 2021-10-09 ENCOUNTER — Encounter: Payer: Medicare HMO | Attending: Pulmonary Disease | Admitting: *Deleted

## 2021-10-09 DIAGNOSIS — J849 Interstitial pulmonary disease, unspecified: Secondary | ICD-10-CM | POA: Diagnosis not present

## 2021-10-09 DIAGNOSIS — Z5189 Encounter for other specified aftercare: Secondary | ICD-10-CM | POA: Diagnosis not present

## 2021-10-09 DIAGNOSIS — U099 Post covid-19 condition, unspecified: Secondary | ICD-10-CM | POA: Insufficient documentation

## 2021-10-09 DIAGNOSIS — R0600 Dyspnea, unspecified: Secondary | ICD-10-CM | POA: Insufficient documentation

## 2021-10-09 LAB — GLUCOSE, CAPILLARY: Glucose-Capillary: 138 mg/dL — ABNORMAL HIGH (ref 70–99)

## 2021-10-09 NOTE — Progress Notes (Signed)
Pulmonary Individual Treatment Plan  Patient Details  Name: Kayla Wu MRN: [id number] Date of Birth: 06/29/1945 Referring Provider:   Flowsheet Row Pulmonary Rehab from 09/18/2021 in [facility] Cardiac and Pulmonary Rehab  Referring Provider Lanney Gins       Initial Encounter Date:  Flowsheet Row Pulmonary Rehab from 09/18/2021 in [facility] Cardiac and Pulmonary Rehab  Date 09/18/21       Visit Diagnosis: ILD (interstitial lung disease) ([place])  Post covid-19 condition, unspecified  Dyspnea, unspecified type  Patient's Home Medications on Admission:  Current Outpatient Medications:    albuterol (VENTOLIN HFA) 108 (90 Base) MCG/ACT inhaler, Inhale into the lungs., Disp: , Rfl:    aspirin EC 81 MG tablet, Take by mouth., Disp: , Rfl:    atorvastatin (LIPITOR) 20 MG tablet, Take 1 tablet by mouth daily., Disp: , Rfl:    bisoprolol (ZEBETA) 5 MG tablet, Take 1 tablet (5 mg total) by mouth daily., Disp: 90 tablet, Rfl: 3   diltiazem (CARDIZEM CD) 180 MG 24 hr capsule, Take 1 capsule (180 mg total) by mouth daily., Disp: 90 capsule, Rfl: 0   diphenhydrAMINE (BENADRYL) 50 MG capsule, Take 1 capsule 1 hour prior to procedure, Disp: 1 capsule, Rfl: 0   ferrous sulfate 325 (65 FE) MG tablet, Take 325 mg by mouth daily with breakfast., Disp: , Rfl:    Fluticasone-Umeclidin-Vilant (TRELEGY ELLIPTA) 100-62.5-25 MCG/ACT AEPB, Inhale into the lungs., Disp: , Rfl:    gabapentin (NEURONTIN) 600 MG tablet, Take 600 mg by mouth at bedtime., Disp: , Rfl:    hydrOXYzine (ATARAX) 50 MG tablet, , Disp: , Rfl:    insulin NPH-regular Human (70-30) 100 UNIT/ML injection, Inject into the skin as needed. (Patient not taking: Reported on 09/03/2021), Disp: , Rfl:    LORazepam (ATIVAN) 1 MG tablet, Take 1 mg by mouth 2 (two) times daily as needed for anxiety or sleep., Disp: , Rfl:    losartan (COZAAR) 100 MG tablet, Take 100 mg by mouth daily., Disp: , Rfl:    magnesium oxide (MAG-OX) 400 MG tablet, Take 400 mg by  mouth 2 (two) times daily., Disp: , Rfl:    metFORMIN (GLUCOPHAGE-XR) 500 MG 24 hr tablet, Take 1,500 mg by mouth daily with supper., Disp: , Rfl:    metoprolol tartrate (LOPRESSOR) 25 MG tablet, Take 1 tablet (25 mg total) by mouth once for 1 dose. Take 2 hours prior to your CT., Disp: 1 tablet, Rfl: 0   omeprazole (PRILOSEC) 40 MG capsule, Take 1 capsule by mouth daily., Disp: , Rfl:    predniSONE (DELTASONE) 50 MG tablet, Take 50 mg 13 hours, 7 hour, and 1 hour prior to procedure, Disp: 3 tablet, Rfl: 0   TRULICITY 1.5 FG/1.8EX SOPN, Inject 1.5 mg into the skin once a week., Disp: , Rfl:    venlafaxine XR (EFFEXOR-XR) 150 MG 24 hr capsule, Take 150 mg by mouth daily. (take with 37.'5mg'$  capsule to equal 187.'5mg'$  total), Disp: , Rfl:    venlafaxine XR (EFFEXOR-XR) 37.5 MG 24 hr capsule, Take 37.5 mg by mouth daily. (take with '150mg'$  capsule to equal 187.'5mg'$  total), Disp: , Rfl:    vitamin B-12 (CYANOCOBALAMIN) 1000 MCG tablet, Take 1,000 mcg by mouth daily., Disp: , Rfl:   Past Medical History: Past Medical History:  Diagnosis Date   Atrial fibrillation with RVR ([place]) 06/29/2020   Chronic urticaria    COPD (chronic obstructive pulmonary disease) ([place])    COVID-19 virus infection 06/2020   DDD (degenerative disc disease)  Diabetes mellitus ([place])    Diastolic dysfunction    a. 01/2018 Echo: Nl EF. Triv AI/MR/PR. Mild TR; b. 06/2020 Echo: EF 60-65%, no rwma, gr1 DD. Nl RV size/fxn. RVSP 14.37mHg.   GERD (gastroesophageal reflux disease)    Hepatic steatosis    Hypertension    Menopausal state    Morbid obesity ([place]    Non-cardiac chest pain    a. 07/2014 MV: EF 75%, no ischemia/artifact.   Ovarian cancer ([place] 2012   s/p chemo    PAF (paroxysmal atrial fibrillation) ([place]    a. Dx 06/2020 in setting of COVID infection; b. CHA2DS2VASc = 5-->eliquis.   Personal history of chemotherapy 2012   ovarian   Pneumonia 1987   Pulmonary embolus ([place]    Rectovaginal fistula     Tobacco  Use: Social History   Tobacco Use  Smoking Status Former   Packs/day: 2.00   Years: 42.00   Total pack years: 84.00   Types: Cigarettes   Quit date: 07/05/2007   Years since quitting: 14.2  Smokeless Tobacco Never    Labs: Review Flowsheet       Latest Ref Rng & Units 01/19/2020 06/25/2020 06/26/2020  Labs for ITP Cardiac and Pulmonary Rehab  Trlycerides <150 mg/dL - 110  -  Hemoglobin A1c 4.8 - 5.6 % 6.8  - 7.4      Pulmonary Assessment Scores:  Pulmonary Assessment Scores     Row Name 09/18/21 1537         ADL UCSD   ADL Phase Entry     SOB Score total 76     Rest 1     Walk 2     Stairs 5     Bath 2     Dress 1     Shop 5       CAT Score   CAT Score 21       mMRC Score   mMRC Score 2              UCSD: Self-administered rating of dyspnea associated with activities of daily living (ADLs) 6-point scale (0 = "not at all" to 5 = "maximal or unable to do because of breathlessness")  Scoring Scores range from 0 to 120.  Minimally important difference is 5 units  CAT: CAT can identify the health impairment of COPD patients and is better correlated with disease progression.  CAT has a scoring range of zero to 40. The CAT score is classified into four groups of low (less than 10), medium (10 - 20), high (21-30) and very high (31-40) based on the impact level of disease on health status. A CAT score over 10 suggests significant symptoms.  A worsening CAT score could be explained by an exacerbation, poor medication adherence, poor inhaler technique, or progression of COPD or comorbid conditions.  CAT MCID is 2 points  mMRC: mMRC (Modified Medical Research Council) Dyspnea Scale is used to assess the degree of baseline functional disability in patients of respiratory disease due to dyspnea. No minimal important difference is established. A decrease in score of 1 point or greater is considered a positive change.   Pulmonary Function Assessment:   Exercise  Target Goals: Exercise Program Goal: Individual exercise prescription set using results from initial 6 min walk test and THRR while considering  patient's activity barriers and safety.   Exercise Prescription Goal: Initial exercise prescription builds to 30-45 minutes a day of aerobic activity, 2-3 days per week.  Home exercise guidelines  will be given to patient during program as part of exercise prescription that the participant will acknowledge.  Education: Aerobic Exercise: - Group verbal and visual presentation on the components of exercise prescription. Introduces F.I.T.T principle from ACSM for exercise prescriptions.  Reviews F.I.T.T. principles of aerobic exercise including progression. Written material given at graduation. Flowsheet Row Pulmonary Rehab from 09/18/2021 in [facility] Cardiac and Pulmonary Rehab  Education need identified 09/18/21       Education: Resistance Exercise: - Group verbal and visual presentation on the components of exercise prescription. Introduces F.I.T.T principle from ACSM for exercise prescriptions  Reviews F.I.T.T. principles of resistance exercise including progression. Written material given at graduation.    Education: Exercise & Equipment Safety: - Individual verbal instruction and demonstration of equipment use and safety with use of the equipment. Flowsheet Row Pulmonary Rehab from 09/18/2021 in [facility] Cardiac and Pulmonary Rehab  Date 09/18/21  Educator NT  Instruction Review Code 1- Verbalizes Understanding       Education: Exercise Physiology & General Exercise Guidelines: - Group verbal and written instruction with models to review the exercise physiology of the cardiovascular system and associated critical values. Provides general exercise guidelines with specific guidelines to those with heart or lung disease.  Flowsheet Row Pulmonary Rehab from 09/18/2021 in [facility] Cardiac and Pulmonary Rehab  Education need identified 09/18/21        Education: Flexibility, Balance, Mind/Body Relaxation: - Group verbal and visual presentation with interactive activity on the components of exercise prescription. Introduces F.I.T.T principle from ACSM for exercise prescriptions. Reviews F.I.T.T. principles of flexibility and balance exercise training including progression. Also discusses the mind body connection.  Reviews various relaxation techniques to help reduce and manage stress (i.e. Deep breathing, progressive muscle relaxation, and visualization). Balance handout provided to take home. Written material given at graduation.   Activity Barriers & Risk Stratification:  Activity Barriers & Cardiac Risk Stratification - 09/18/21 1516       Activity Barriers & Cardiac Risk Stratification   Activity Barriers Shortness of Breath             6 Minute Walk:  6 Minute Walk     Row Name 09/18/21 1510         6 Minute Walk   Phase Initial     Distance 950 feet     Walk Time 5.53 minutes     # of Rest Breaks 2     MPH 1.95     METS 1.55     RPE 13     Perceived Dyspnea  3     VO2 Peak 5.44     Symptoms Yes (comment)     Comments SOB     Resting HR 59 bpm     Resting BP 118/62     Resting Oxygen Saturation  94 %     Exercise Oxygen Saturation  during 6 min walk 88 %     Max Ex. HR 86 bpm     Max Ex. BP 144/72     2 Minute Post BP 132/68       Interval HR   1 Minute HR 84     2 Minute HR 86     3 Minute HR 86     4 Minute HR 85     5 Minute HR 86     6 Minute HR 86     2 Minute Post HR 64     Interval Heart Rate? Yes  Interval Oxygen   Interval Oxygen? Yes     Baseline Oxygen Saturation % 94 %     1 Minute Oxygen Saturation % 91 %     1 Minute Liters of Oxygen 0 L  RA     2 Minute Oxygen Saturation % 88 %     2 Minute Liters of Oxygen 0 L  RA     3 Minute Oxygen Saturation % 90 %     3 Minute Liters of Oxygen 0 L  RA     4 Minute Oxygen Saturation % 92 %     4 Minute Liters of Oxygen 0 L  RA     5  Minute Oxygen Saturation % 91 %     5 Minute Liters of Oxygen 0 L  RA     6 Minute Oxygen Saturation % 90 %     6 Minute Liters of Oxygen 0 L  RA     2 Minute Post Oxygen Saturation % 93 %     2 Minute Post Liters of Oxygen 0 L  RA             Oxygen Initial Assessment:  Oxygen Initial Assessment - 09/16/21 1422       Home Oxygen   Home Oxygen Device None    Sleep Oxygen Prescription None    Home Exercise Oxygen Prescription None    Home Resting Oxygen Prescription None      Initial 6 min Walk   Oxygen Used None      Program Oxygen Prescription   Program Oxygen Prescription None      Intervention   Short Term Goals To learn and demonstrate proper pursed lip breathing techniques or other breathing techniques. ;To learn and demonstrate proper use of respiratory medications;To learn and understand importance of maintaining oxygen saturations>88%    Long  Term Goals Verbalizes importance of monitoring SPO2 with pulse oximeter and return demonstration;Maintenance of O2 saturations>88%;Exhibits proper breathing techniques, such as pursed lip breathing or other method taught during program session;Demonstrates proper use of MDI's;Compliance with respiratory medication             Oxygen Re-Evaluation:  Oxygen Re-Evaluation     Row Name 10/09/21 1154             Program Oxygen Prescription   Program Oxygen Prescription None         Home Oxygen   Home Oxygen Device None       Sleep Oxygen Prescription None       Home Exercise Oxygen Prescription None       Home Resting Oxygen Prescription None         Goals/Expected Outcomes   Short Term Goals To learn and demonstrate proper pursed lip breathing techniques or other breathing techniques.        Long  Term Goals Exhibits proper breathing techniques, such as pursed lip breathing or other method taught during program session       Comments Reviewed PLB technique with pt.  Talked about how it works and it's importance in  maintaining their exercise saturations.       Goals/Expected Outcomes Short: Become more profiecient at using PLB.   Long: Become independent at using PLB.                Oxygen Discharge (Final Oxygen Re-Evaluation):  Oxygen Re-Evaluation - 10/09/21 1154       Program Oxygen Prescription   Program Oxygen Prescription None  Home Oxygen   Home Oxygen Device None    Sleep Oxygen Prescription None    Home Exercise Oxygen Prescription None    Home Resting Oxygen Prescription None      Goals/Expected Outcomes   Short Term Goals To learn and demonstrate proper pursed lip breathing techniques or other breathing techniques.     Long  Term Goals Exhibits proper breathing techniques, such as pursed lip breathing or other method taught during program session    Comments Reviewed PLB technique with pt.  Talked about how it works and it's importance in maintaining their exercise saturations.    Goals/Expected Outcomes Short: Become more profiecient at using PLB.   Long: Become independent at using PLB.             Initial Exercise Prescription:  Initial Exercise Prescription - 09/18/21 1500       Date of Initial Exercise RX and Referring Provider   Date 09/18/21    Referring Provider Aleskerov      Oxygen   Maintain Oxygen Saturation 88% or higher      Treadmill   MPH 1.5    Grade 0    Minutes 15    METs 2.15      NuStep   Level 1    SPM 80    Minutes 15    METs 1.55      T5 Nustep   Level 1    SPM 80    Minutes 15    METs 1.55      Biostep-RELP   Level 1    SPM 60    Minutes 15    METs 1.55      Track   Laps 15    Minutes 15    METs 1.82      Prescription Details   Frequency (times per week) 3    Duration Progress to 30 minutes of continuous aerobic without signs/symptoms of physical distress      Intensity   THRR 40-80% of Max Heartrate 93-127    Ratings of Perceived Exertion 11-15    Perceived Dyspnea 0-4      Progression   Progression  Continue to progress workloads to maintain intensity without signs/symptoms of physical distress.      Resistance Training   Training Prescription Yes    Weight 3 lb    Reps 10-15             Perform Capillary Blood Glucose checks as needed.  Exercise Prescription Changes:   Exercise Prescription Changes     Row Name 09/18/21 1500             Response to Exercise   Blood Pressure (Admit) 118/62       Blood Pressure (Exercise) 144/72       Blood Pressure (Exit) 120/64       Heart Rate (Admit) 59 bpm       Heart Rate (Exercise) 86 bpm       Heart Rate (Exit) 63 bpm       Oxygen Saturation (Admit) 94 %       Oxygen Saturation (Exercise) 88 %       Oxygen Saturation (Exit) 91 %       Rating of Perceived Exertion (Exercise) 13       Perceived Dyspnea (Exercise) 3       Symptoms SOB       Comments Completed 6MWT       Duration Progress to 30 minutes  of  aerobic without signs/symptoms of physical distress       Intensity THRR unchanged                Exercise Comments:   Exercise Comments     Row Name 10/09/21 1154           Exercise Comments First full day of exercise!  Patient was oriented to gym and equipment including functions, settings, policies, and procedures.  Patient's individual exercise prescription and treatment plan were reviewed.  All starting workloads were established based on the results of the 6 minute walk test done at initial orientation visit.  The plan for exercise progression was also introduced and progression will be customized based on patient's performance and goals.                Exercise Goals and Review:   Exercise Goals     Row Name 09/18/21 1528             Exercise Goals   Increase Physical Activity Yes       Intervention Provide advice, education, support and counseling about physical activity/exercise needs.;Develop an individualized exercise prescription for aerobic and resistive training based on initial  evaluation findings, risk stratification, comorbidities and participant's personal goals.       Expected Outcomes Short Term: Attend rehab on a regular basis to increase amount of physical activity.;Long Term: Add in home exercise to make exercise part of routine and to increase amount of physical activity.;Long Term: Exercising regularly at least 3-5 days a week.       Increase Strength and Stamina Yes       Intervention Provide advice, education, support and counseling about physical activity/exercise needs.;Develop an individualized exercise prescription for aerobic and resistive training based on initial evaluation findings, risk stratification, comorbidities and participant's personal goals.       Expected Outcomes Short Term: Increase workloads from initial exercise prescription for resistance, speed, and METs.;Long Term: Improve cardiorespiratory fitness, muscular endurance and strength as measured by increased METs and functional capacity (6MWT);Short Term: Perform resistance training exercises routinely during rehab and add in resistance training at home       Able to understand and use rate of perceived exertion (RPE) scale Yes       Intervention Provide education and explanation on how to use RPE scale       Expected Outcomes Short Term: Able to use RPE daily in rehab to express subjective intensity level;Long Term:  Able to use RPE to guide intensity level when exercising independently       Able to understand and use Dyspnea scale Yes       Intervention Provide education and explanation on how to use Dyspnea scale       Expected Outcomes Short Term: Able to use Dyspnea scale daily in rehab to express subjective sense of shortness of breath during exertion;Long Term: Able to use Dyspnea scale to guide intensity level when exercising independently       Knowledge and understanding of Target Heart Rate Range (THRR) Yes       Intervention Provide education and explanation of THRR including how  the numbers were predicted and where they are located for reference       Expected Outcomes Short Term: Able to state/look up THRR;Long Term: Able to use THRR to govern intensity when exercising independently;Short Term: Able to use daily as guideline for intensity in rehab       Able to check  pulse independently Yes       Intervention Review the importance of being able to check your own pulse for safety during independent exercise;Provide education and demonstration on how to check pulse in carotid and radial arteries.       Expected Outcomes Short Term: Able to explain why pulse checking is important during independent exercise;Long Term: Able to check pulse independently and accurately       Understanding of Exercise Prescription Yes       Intervention Provide education, explanation, and written materials on patient's individual exercise prescription       Expected Outcomes Short Term: Able to explain program exercise prescription;Long Term: Able to explain home exercise prescription to exercise independently                Exercise Goals Re-Evaluation :  Exercise Goals Re-Evaluation     Row Name 10/09/21 1155             Exercise Goal Re-Evaluation   Exercise Goals Review Able to understand and use rate of perceived exertion (RPE) scale;Knowledge and understanding of Target Heart Rate Range (THRR);Able to understand and use Dyspnea scale;Understanding of Exercise Prescription       Comments Reviewed RPE and dyspnea scales, THR and program prescription with pt today.  Pt voiced understanding and was given a copy of goals to take home.       Expected Outcomes Short: Use RPE daily to regulate intensity. Long: Follow program prescription in THR.                Discharge Exercise Prescription (Final Exercise Prescription Changes):  Exercise Prescription Changes - 09/18/21 1500       Response to Exercise   Blood Pressure (Admit) 118/62    Blood Pressure (Exercise) 144/72     Blood Pressure (Exit) 120/64    Heart Rate (Admit) 59 bpm    Heart Rate (Exercise) 86 bpm    Heart Rate (Exit) 63 bpm    Oxygen Saturation (Admit) 94 %    Oxygen Saturation (Exercise) 88 %    Oxygen Saturation (Exit) 91 %    Rating of Perceived Exertion (Exercise) 13    Perceived Dyspnea (Exercise) 3    Symptoms SOB    Comments Completed 6MWT    Duration Progress to 30 minutes of  aerobic without signs/symptoms of physical distress    Intensity THRR unchanged             Nutrition:  Target Goals: Understanding of nutrition guidelines, daily intake of sodium '1500mg'$ , cholesterol '200mg'$ , calories 30% from fat and 7% or less from saturated fats, daily to have 5 or more servings of fruits and vegetables.  Education: All About Nutrition: -Group instruction provided by verbal, written material, interactive activities, discussions, models, and posters to present general guidelines for heart healthy nutrition including fat, fiber, MyPlate, the role of sodium in heart healthy nutrition, utilization of the nutrition label, and utilization of this knowledge for meal planning. Follow up email sent as well. Written material given at graduation.   Biometrics:  Pre Biometrics - 09/18/21 1528       Pre Biometrics   Height 5' 4.5" (1.638 m)    Weight 196 lb 6.4 oz (89.1 kg)    Waist Circumference 41.5 inches    Hip Circumference 45.5 inches    Waist to Hip Ratio 0.91 %    BMI (Calculated) 33.2    Single Leg Stand 2.71 seconds  Nutrition Therapy Plan and Nutrition Goals:  Nutrition Therapy & Goals - 09/18/21 1533       Intervention Plan   Intervention Prescribe, educate and counsel regarding individualized specific dietary modifications aiming towards targeted core components such as weight, hypertension, lipid management, diabetes, heart failure and other comorbidities.    Expected Outcomes Short Term Goal: Understand basic principles of dietary content, such as  calories, fat, sodium, cholesterol and nutrients.;Short Term Goal: A plan has been developed with personal nutrition goals set during dietitian appointment.;Long Term Goal: Adherence to prescribed nutrition plan.             Nutrition Assessments:  MEDIFICTS Score Key: ?70 Need to make dietary changes  40-70 Heart Healthy Diet ? 40 Therapeutic Level Cholesterol Diet  Flowsheet Row Pulmonary Rehab from 09/18/2021 in [facility] Cardiac and Pulmonary Rehab  Picture Your Plate Total Score on Admission 48      Picture Your Plate Scores: <29 Unhealthy dietary pattern with much room for improvement. 41-50 Dietary pattern unlikely to meet recommendations for good health and room for improvement. 51-60 More healthful dietary pattern, with some room for improvement.  >60 Healthy dietary pattern, although there may be some specific behaviors that could be improved.   Nutrition Goals Re-Evaluation:   Nutrition Goals Discharge (Final Nutrition Goals Re-Evaluation):   Psychosocial: Target Goals: Acknowledge presence or absence of significant depression and/or stress, maximize coping skills, provide positive support system. Participant is able to verbalize types and ability to use techniques and skills needed for reducing stress and depression.   Education: Stress, Anxiety, and Depression - Group verbal and visual presentation to define topics covered.  Reviews how body is impacted by stress, anxiety, and depression.  Also discusses healthy ways to reduce stress and to treat/manage anxiety and depression.  Written material given at graduation.   Education: Sleep Hygiene -Provides group verbal and written instruction about how sleep can affect your health.  Define sleep hygiene, discuss sleep cycles and impact of sleep habits. Review good sleep hygiene tips.    Initial Review & Psychosocial Screening:  Initial Psych Review & Screening - 09/16/21 1423       Initial Review   Current issues  with Current Depression;Current Anxiety/Panic;Current Psychotropic Meds      Family Dynamics   Good Support System? Yes   Merrilee Seashore, husband     Barriers   Psychosocial barriers to participate in program There are no identifiable barriers or psychosocial needs.      Screening Interventions   Interventions Encouraged to exercise;To provide support and resources with identified psychosocial needs;Provide feedback about the scores to participant    Expected Outcomes Short Term goal: Utilizing psychosocial counselor, staff and physician to assist with identification of specific Stressors or current issues interfering with healing process. Setting desired goal for each stressor or current issue identified.;Long Term Goal: Stressors or current issues are controlled or eliminated.;Short Term goal: Identification and review with participant of any Quality of Life or Depression concerns found by scoring the questionnaire.;Long Term goal: The participant improves quality of Life and PHQ9 Scores as seen by post scores and/or verbalization of changes             Quality of Life Scores:  Scores of 19 and below usually indicate a poorer quality of life in these areas.  A difference of  2-3 points is a clinically meaningful difference.  A difference of 2-3 points in the total score of the Quality of Life Index has been associated  with significant improvement in overall quality of life, self-image, physical symptoms, and general health in studies assessing change in quality of life.  PHQ-9: Review Flowsheet       09/18/2021  Depression screen PHQ 2/9  Decreased Interest 1  Down, Depressed, Hopeless 2  PHQ - 2 Score 3  Altered sleeping 2  Tired, decreased energy 3  Change in appetite 3  Feeling bad or failure about yourself  0  Trouble concentrating 3  Moving slowly or fidgety/restless 1  Suicidal thoughts 2  PHQ-9 Score 17  Difficult doing work/chores Somewhat difficult   Interpretation of Total  Score  Total Score Depression Severity:  1-4 = Minimal depression, 5-9 = Mild depression, 10-14 = Moderate depression, 15-19 = Moderately severe depression, 20-27 = Severe depression   Psychosocial Evaluation and Intervention:  Psychosocial Evaluation - 09/16/21 1427       Psychosocial Evaluation & Interventions   Interventions Encouraged to exercise with the program and follow exercise prescription    Comments Tayja has no barriers to attending the program. She lives with her husband Jolaine Artist is her support. She wants to gain more stamina and decrease her shortness of breath. She is on effoxor fro some depression. She is managing well with her meds. She is ready to get started.    Expected Outcomes STG Anysia is able to attend all scheduled sessions with progess in her exerciseprescription and improvement with her shortness of breath symptoms. [employer] with progression of her exercise after discharge    Continue Psychosocial Services  Follow up required by staff             Psychosocial Re-Evaluation:   Psychosocial Discharge (Final Psychosocial Re-Evaluation):   Education: Education Goals: Education classes will be provided on a weekly basis, covering required topics. Participant will state understanding/return demonstration of topics presented.  Learning Barriers/Preferences:   General Pulmonary Education Topics:  Infection Prevention: - Provides verbal and written material to individual with discussion of infection control including proper hand washing and proper equipment cleaning during exercise session. Flowsheet Row Pulmonary Rehab from 09/18/2021 in [facility] Cardiac and Pulmonary Rehab  Date 09/18/21  Educator NT  Instruction Review Code 1- Verbalizes Understanding       Falls Prevention: - Provides verbal and written material to individual with discussion of falls prevention and safety. Flowsheet Row Pulmonary Rehab from 09/18/2021 in [facility] Cardiac and  Pulmonary Rehab  Date 09/16/21  Educator SB  Instruction Review Code 1- Verbalizes Understanding       Chronic Lung Disease Review: - Group verbal instruction with posters, models, PowerPoint presentations and videos,  to review new updates, new respiratory medications, new advancements in procedures and treatments. Providing information on websites and "800" numbers for continued self-education. Includes information about supplement oxygen, available portable oxygen systems, continuous and intermittent flow rates, oxygen safety, concentrators, and Medicare reimbursement for oxygen. Explanation of Pulmonary Drugs, including class, frequency, complications, importance of spacers, rinsing mouth after steroid MDI's, and proper cleaning methods for nebulizers. Review of basic lung anatomy and physiology related to function, structure, and complications of lung disease. Review of risk factors. Discussion about methods for diagnosing sleep apnea and types of masks and machines for OSA. Includes a review of the use of types of environmental controls: home humidity, furnaces, filters, dust mite/pet prevention, HEPA vacuums. Discussion about weather changes, air quality and the benefits of nasal washing. Instruction on Warning signs, infection symptoms, calling MD promptly, preventive modes, and value of vaccinations. Review of  effective airway clearance, coughing and/or vibration techniques. Emphasizing that all should Create an Action Plan. Written material given at graduation. Flowsheet Row Pulmonary Rehab from 09/18/2021 in [facility] Cardiac and Pulmonary Rehab  Education need identified 09/18/21       AED/CPR: - Group verbal and written instruction with the use of models to demonstrate the basic use of the AED with the basic ABC's of resuscitation.    Anatomy and Cardiac Procedures: - Group verbal and visual presentation and models provide information about basic cardiac anatomy and function. Reviews the  testing methods done to diagnose heart disease and the outcomes of the test results. Describes the treatment choices: Medical Management, Angioplasty, or Coronary Bypass Surgery for treating various heart conditions including Myocardial Infarction, Angina, Valve Disease, and Cardiac Arrhythmias.  Written material given at graduation.   Medication Safety: - Group verbal and visual instruction to review commonly prescribed medications for heart and lung disease. Reviews the medication, class of the drug, and side effects. Includes the steps to properly store meds and maintain the prescription regimen.  Written material given at graduation.   Other: -Provides group and verbal instruction on various topics (see comments)   Knowledge Questionnaire Score:  Knowledge Questionnaire Score - 09/18/21 1536       Knowledge Questionnaire Score   Pre Score 14/18              Core Components/Risk Factors/Patient Goals at Admission:  Personal Goals and Risk Factors at Admission - 09/18/21 1534       Core Components/Risk Factors/Patient Goals on Admission    Weight Management Yes    Intervention Weight Management: Develop a combined nutrition and exercise program designed to reach desired caloric intake, while maintaining appropriate intake of nutrient and fiber, sodium and fats, and appropriate energy expenditure required for the weight goal.;Weight Management/Obesity: Establish reasonable short term and long term weight goals.;Obesity: Provide education and appropriate resources to help participant work on and attain dietary goals.    Admit Weight 196 lb 6.4 oz (89.1 kg)    Goal Weight: Short Term 190 lb (86.2 kg)    Goal Weight: Long Term 185 lb (83.9 kg)    Expected Outcomes Short Term: Continue to assess and modify interventions until short term weight is achieved;Long Term: Adherence to nutrition and physical activity/exercise program aimed toward attainment of established weight goal;Weight  Loss: Understanding of general recommendations for a balanced deficit meal plan, which promotes 1-2 lb weight loss per week and includes a negative energy balance of [phone] kcal/d    Improve shortness of breath with ADL's Yes    Intervention Provide education, individualized exercise plan and daily activity instruction to help decrease symptoms of SOB with activities of daily living.    Expected Outcomes Short Term: Improve cardiorespiratory fitness to achieve a reduction of symptoms when performing ADLs;Long Term: Be able to perform more ADLs without symptoms or delay the onset of symptoms    Increase knowledge of respiratory medications and ability to use respiratory devices properly  Yes    Intervention Provide education and demonstration as needed of appropriate use of medications, inhalers, and oxygen therapy.    Expected Outcomes Short Term: Achieves understanding of medications use. Understands that oxygen is a medication prescribed by physician. Demonstrates appropriate use of inhaler and oxygen therapy.;Long Term: Maintain appropriate use of medications, inhalers, and oxygen therapy.    Diabetes Yes    Intervention Provide education about signs/symptoms and action to take for hypo/hyperglycemia.;Provide education about proper nutrition,  including hydration, and aerobic/resistive exercise prescription along with prescribed medications to achieve blood glucose in normal ranges: Fasting glucose 65-99 mg/dL    Expected Outcomes Short Term: Participant verbalizes understanding of the signs/symptoms and immediate care of hyper/hypoglycemia, proper foot care and importance of medication, aerobic/resistive exercise and nutrition plan for blood glucose control.;Long Term: Attainment of HbA1C < 7%.    Hypertension Yes    Intervention Provide education on lifestyle modifcations including regular physical activity/exercise, weight management, moderate sodium restriction and increased consumption of fresh  fruit, vegetables, and low fat dairy, alcohol moderation, and smoking cessation.;Monitor prescription use compliance.    Expected Outcomes Short Term: Continued assessment and intervention until BP is < 140/7m HG in hypertensive participants. < 130/848mHG in hypertensive participants with diabetes, heart failure or chronic kidney disease.;Long Term: Maintenance of blood pressure at goal levels.    Lipids Yes    Intervention Provide education and support for participant on nutrition & aerobic/resistive exercise along with prescribed medications to achieve LDL '70mg'$ , HDL >'40mg'$ .    Expected Outcomes Short Term: Participant states understanding of desired cholesterol values and is compliant with medications prescribed. Participant is following exercise prescription and nutrition guidelines.;Long Term: Cholesterol controlled with medications as prescribed, with individualized exercise RX and with personalized nutrition plan. Value goals: LDL < '70mg'$ , HDL > 40 mg.             Education:Diabetes - Individual verbal and written instruction to review signs/symptoms of diabetes, desired ranges of glucose level fasting, after meals and with exercise. Acknowledge that pre and post exercise glucose checks will be done for 3 sessions at entry of program. [facility]  Date 09/18/21  Educator NT  Instruction Review Code 1- Verbalizes Understanding       Know Your Numbers and Heart Failure: - Group verbal and visual instruction to discuss disease risk factors for cardiac and pulmonary disease and treatment options.  Reviews associated critical values for Overweight/Obesity, Hypertension, Cholesterol, and Diabetes.  Discusses basics of heart failure: signs/symptoms and treatments.  Introduces Heart Failure Zone chart for action plan for heart failure.  Written material given at graduation.   Core Components/Risk Factors/Patient Goals  Review:    Core Components/Risk Factors/Patient Goals at Discharge (Final Review):    ITP Comments:  ITP Comments     Row Name 09/16/21 1507 09/18/21 1448 10/09/21 1154 10/09/21 1332     ITP Comments Virtual orientation call completed today. shehas an appointment on Date: 09/20/2021  for EP eval and gym Orientation.  [facility] . Completed 6MWT and gym orientation. Initial ITP created and sent for review to Dr. FuOttie GlazierMedical Director. First full day of exercise!  Patient was oriented to gym and equipment including functions, settings, policies, and procedures.  Patient's individual exercise prescription and treatment plan were reviewed.  All starting workloads were established based on the results of the 6 minute walk test done at initial orientation visit.  The plan for exercise progression was also introduced and progression will be customized based on patient's performance and goals. 30 Day review completed. Medical Director ITP review done, changes made as directed, and signed approval by Medical Director.    NEW             Comments:

## 2021-10-09 NOTE — Progress Notes (Signed)
Daily Session Note  Patient Details  Name: Kayla Wu MRN: [id number] Date of Birth: 1945/05/20 Referring Provider:   Flowsheet Row Pulmonary Rehab from 09/18/2021 in [facility] Cardiac and Pulmonary Rehab  Referring Provider Lanney Gins       Encounter Date: 10/09/2021  Check In:  Session Check In - 10/09/21 1152       Check-In   Supervising physician immediately available to respond to emergencies See telemetry face sheet for immediately available ER MD    Location [facility]-Cardiac & Pulmonary Rehab    Staff Present Heath Lark, RN, BSN, CCRP;Joseph Hood, RCP,RRT,BSRT;Noah Tickle, [place], Exercise Physiologist    Virtual Visit No    Medication changes reported     No    Fall or balance concerns reported    No    Warm-up and Cool-down Performed on first and last piece of equipment    Resistance Training Performed Yes    VAD Patient? No      Pain Assessment   Currently in Pain? No/denies                Social History   Tobacco Use  Smoking Status Former   Packs/day: 2.00   Years: 42.00   Total pack years: 84.00   Types: Cigarettes   Quit date: 07/05/2007   Years since quitting: 14.2  Smokeless Tobacco Never    Goals Met:  Proper associated with RPD/PD & O2 Sat Exercise tolerated well Personal goals reviewed No report of concerns or symptoms today  Goals Unmet:  Not Applicable  Comments: First full day of exercise!  Patient was oriented to gym and equipment including functions, settings, policies, and procedures.  Patient's individual exercise prescription and treatment plan were reviewed.  All starting workloads were established based on the results of the 6 minute walk test done at initial orientation visit.  The plan for exercise progression was also introduced and progression will be customized based on patient's performance and goals.   Dr. Emily Filbert is Medical Director for [place].  Dr. Ottie Glazier is Medical Director for  [facility] Pulmonary Rehabilitation.

## 2021-10-11 ENCOUNTER — Encounter: Payer: Medicare HMO | Admitting: *Deleted

## 2021-10-11 DIAGNOSIS — U099 Post covid-19 condition, unspecified: Secondary | ICD-10-CM

## 2021-10-11 DIAGNOSIS — Z5189 Encounter for other specified aftercare: Secondary | ICD-10-CM | POA: Diagnosis not present

## 2021-10-11 DIAGNOSIS — R0600 Dyspnea, unspecified: Secondary | ICD-10-CM | POA: Diagnosis not present

## 2021-10-11 DIAGNOSIS — J849 Interstitial pulmonary disease, unspecified: Secondary | ICD-10-CM

## 2021-10-11 LAB — GLUCOSE, CAPILLARY
Glucose-Capillary: 123 mg/dL — ABNORMAL HIGH (ref 70–99)
Glucose-Capillary: 120 mg/dL — ABNORMAL HIGH (ref 70–99)

## 2021-10-11 NOTE — Progress Notes (Signed)
Daily Session Note  Patient Details  Name: VIANNEY KOPECKY MRN: [id number] Date of Birth: [birth year] Referring Provider:   Flowsheet Row Pulmonary Rehab from 09/18/2021 in [facility] Cardiac and Pulmonary Rehab  Referring Provider Lanney Gins       Encounter Date: 10/11/2021  Check In:  Session Check In - 10/11/21 1007       Check-In   Supervising physician immediately available to respond to emergencies See telemetry face sheet for immediately available ER MD    Location [facility]-Cardiac & Pulmonary Rehab    Staff Present Heath Lark, RN, BSN, CCRP;Joseph Harrison, RCP,RRT,BSRT;Jessica Hamlin, [place], [place], CCRP, CCET    Virtual Visit No    Medication changes reported     No    Fall or balance concerns reported    No    Warm-up and Cool-down Performed on first and last piece of equipment    Resistance Training Performed Yes    VAD Patient? No    PAD/SET Patient? No      Pain Assessment   Currently in Pain? No/denies                Social History   Tobacco Use  Smoking Status Former   Packs/day: 2.00   Years: 42.00   Total pack years: 84.00   Types: Cigarettes   Quit date: 07/05/2007   Years since quitting: 14.2  Smokeless Tobacco Never    Goals Met:  Proper associated with RPD/PD & O2 Sat Independence with exercise equipment Exercise tolerated well No report of concerns or symptoms today  Goals Unmet:  Not Applicable  Comments: Pt able to follow exercise prescription today without complaint.  Will continue to monitor for progression.    Dr. Emily Filbert is Medical Director for [place].  Dr. Ottie Glazier is Medical Director for [facility] Pulmonary Rehabilitation.

## 2021-10-14 ENCOUNTER — Encounter: Payer: Medicare HMO | Admitting: *Deleted

## 2021-10-14 DIAGNOSIS — U099 Post covid-19 condition, unspecified: Secondary | ICD-10-CM

## 2021-10-14 DIAGNOSIS — R0600 Dyspnea, unspecified: Secondary | ICD-10-CM

## 2021-10-14 DIAGNOSIS — Z5189 Encounter for other specified aftercare: Secondary | ICD-10-CM | POA: Diagnosis not present

## 2021-10-14 DIAGNOSIS — J849 Interstitial pulmonary disease, unspecified: Secondary | ICD-10-CM

## 2021-10-14 LAB — GLUCOSE, CAPILLARY
Glucose-Capillary: 119 mg/dL — ABNORMAL HIGH (ref 70–99)
Glucose-Capillary: 132 mg/dL — ABNORMAL HIGH (ref 70–99)

## 2021-10-14 NOTE — Progress Notes (Signed)
Daily Session Note  Patient Details  Name: CYNDAL KASSON MRN: [id number] Date of Birth: 18-Feb-1945 Referring Provider:   Flowsheet Row Pulmonary Rehab from 09/18/2021 in [facility] Cardiac and Pulmonary Rehab  Referring Provider Lanney Gins       Encounter Date: 10/14/2021  Check In:  Session Check In - 10/14/21 1034       Check-In   Supervising physician immediately available to respond to emergencies See telemetry face sheet for immediately available ER MD    Location [facility]-Cardiac & Pulmonary Rehab    Staff Present Earlean Shawl, BS, ACSM CEP, Exercise Physiologist;Noah Tickle, BS, Exercise Physiologist;Mell Mellott, RN, BSN, CCRP    Virtual Visit No    Medication changes reported     No    Fall or balance concerns reported    No    Warm-up and Cool-down Performed on first and last piece of equipment    Resistance Training Performed Yes    VAD Patient? No    PAD/SET Patient? No      Pain Assessment   Currently in Pain? No/denies                Social History   Tobacco Use  Smoking Status Former   Packs/day: 2.00   Years: 42.00   Total pack years: 84.00   Types: Cigarettes   Quit date: 07/05/2007   Years since quitting: 14.2  Smokeless Tobacco Never    Goals Met:  Proper associated with RPD/PD & O2 Sat Independence with exercise equipment Exercise tolerated well No report of concerns or symptoms today  Goals Unmet:  Not Applicable  Comments: Pt able to follow exercise prescription today without complaint.  Will continue to monitor for progression.    Dr. Emily Filbert is Medical Director for [place].  Dr. Ottie Glazier is Medical Director for [facility] Pulmonary Rehabilitation.

## 2021-10-16 ENCOUNTER — Encounter: Payer: Medicare HMO | Admitting: *Deleted

## 2021-10-16 DIAGNOSIS — Z79633 Long term (current) use of mitotic inhibitor: Secondary | ICD-10-CM | POA: Diagnosis not present

## 2021-10-16 DIAGNOSIS — J849 Interstitial pulmonary disease, unspecified: Secondary | ICD-10-CM | POA: Diagnosis not present

## 2021-10-16 DIAGNOSIS — Z08 Encounter for follow-up examination after completed treatment for malignant neoplasm: Secondary | ICD-10-CM | POA: Diagnosis not present

## 2021-10-16 DIAGNOSIS — Z90710 Acquired absence of both cervix and uterus: Secondary | ICD-10-CM | POA: Diagnosis not present

## 2021-10-16 DIAGNOSIS — Z5189 Encounter for other specified aftercare: Secondary | ICD-10-CM | POA: Diagnosis not present

## 2021-10-16 DIAGNOSIS — C563 Malignant neoplasm of bilateral ovaries: Secondary | ICD-10-CM | POA: Diagnosis not present

## 2021-10-16 DIAGNOSIS — Z79630 Long term (current) use of alkylating agent: Secondary | ICD-10-CM | POA: Diagnosis not present

## 2021-10-16 DIAGNOSIS — U099 Post covid-19 condition, unspecified: Secondary | ICD-10-CM | POA: Diagnosis not present

## 2021-10-16 DIAGNOSIS — C569 Malignant neoplasm of unspecified ovary: Secondary | ICD-10-CM | POA: Diagnosis not present

## 2021-10-16 DIAGNOSIS — R0600 Dyspnea, unspecified: Secondary | ICD-10-CM | POA: Diagnosis not present

## 2021-10-16 DIAGNOSIS — Z6834 Body mass index (BMI) 34.0-34.9, adult: Secondary | ICD-10-CM | POA: Diagnosis not present

## 2021-10-16 NOTE — Progress Notes (Signed)
Daily Session Note  Patient Details  Name: Kayla Wu MRN: [id number] Date of Birth: 1945-05-17 Referring Provider:   Flowsheet Row Pulmonary Rehab from 09/18/2021 in [facility] Cardiac and Pulmonary Rehab  Referring Provider Lanney Gins       Encounter Date: 10/16/2021  Check In:  Session Check In - 10/16/21 1616       Check-In   Supervising physician immediately available to respond to emergencies See telemetry face sheet for immediately available ER MD    Location [facility]-Cardiac & Pulmonary Rehab    Staff Present Renita Papa, RN BSN;Joseph Tessie Fass, RCP,RRT,BSRT;Noah [place], [place], Exercise Physiologist    Virtual Visit No    Medication changes reported     No    Fall or balance concerns reported    No    Warm-up and Cool-down Performed on first and last piece of equipment    Resistance Training Performed Yes    VAD Patient? No    PAD/SET Patient? No      Pain Assessment   Currently in Pain? No/denies                Social History   Tobacco Use  Smoking Status Former   Packs/day: 2.00   Years: 42.00   Total pack years: 84.00   Types: Cigarettes   Quit date: 07/05/2007   Years since quitting: 14.2  Smokeless Tobacco Never    Goals Met:  Independence with exercise equipment Exercise tolerated well No report of concerns or symptoms today Strength training completed today  Goals Unmet:  Not Applicable  Comments: Pt able to follow exercise prescription today without complaint.  Will continue to monitor for progression.    Dr. Emily Filbert is Medical Director for [place].  Dr. Ottie Glazier is Medical Director for [facility] Pulmonary Rehabilitation.

## 2021-10-18 ENCOUNTER — Encounter: Payer: Medicare HMO | Admitting: *Deleted

## 2021-10-18 DIAGNOSIS — R0600 Dyspnea, unspecified: Secondary | ICD-10-CM

## 2021-10-18 DIAGNOSIS — U099 Post covid-19 condition, unspecified: Secondary | ICD-10-CM | POA: Diagnosis not present

## 2021-10-18 DIAGNOSIS — J849 Interstitial pulmonary disease, unspecified: Secondary | ICD-10-CM

## 2021-10-18 DIAGNOSIS — Z5189 Encounter for other specified aftercare: Secondary | ICD-10-CM | POA: Diagnosis not present

## 2021-10-18 NOTE — Progress Notes (Signed)
Daily Session Note  Patient Details  Name: LIRIDONA MASHAW MRN: [id number] Date of Birth: 07/05/1945 Referring Provider:   Flowsheet Row Pulmonary Rehab from 09/18/2021 in [facility] Cardiac and Pulmonary Rehab  Referring Provider Lanney Gins       Encounter Date: 10/18/2021  Check In:  Session Check In - 10/18/21 1041       Check-In   Supervising physician immediately available to respond to emergencies See telemetry face sheet for immediately available ER MD    Location [facility]-Cardiac & Pulmonary Rehab    Staff Present Heath Lark, RN, BSN, CCRP;Joseph Tessie Fass, RCP,RRT,BSRT;Other   Kathee Delton BS,Exercise Physiologist   Virtual Visit No    Medication changes reported     No    Fall or balance concerns reported    No    Warm-up and Cool-down Performed on first and last piece of equipment    Resistance Training Performed Yes    VAD Patient? No    PAD/SET Patient? No      Pain Assessment   Currently in Pain? No/denies                Social History   Tobacco Use  Smoking Status Former   Packs/day: 2.00   Years: 42.00   Total pack years: 84.00   Types: Cigarettes   Quit date: 07/05/2007   Years since quitting: 14.2  Smokeless Tobacco Never    Goals Met:  Proper associated with RPD/PD & O2 Sat Independence with exercise equipment Exercise tolerated well No report of concerns or symptoms today  Goals Unmet:  Not Applicable  Comments: Pt able to follow exercise prescription today without complaint.  Will continue to monitor for progression.    Dr. Emily Filbert is Medical Director for [place].  Dr. Ottie Glazier is Medical Director for [facility] Pulmonary Rehabilitation.

## 2021-10-21 ENCOUNTER — Encounter: Payer: Medicare HMO | Admitting: *Deleted

## 2021-10-21 DIAGNOSIS — J849 Interstitial pulmonary disease, unspecified: Secondary | ICD-10-CM | POA: Diagnosis not present

## 2021-10-21 DIAGNOSIS — U099 Post covid-19 condition, unspecified: Secondary | ICD-10-CM | POA: Diagnosis not present

## 2021-10-21 DIAGNOSIS — R0600 Dyspnea, unspecified: Secondary | ICD-10-CM | POA: Diagnosis not present

## 2021-10-21 DIAGNOSIS — Z5189 Encounter for other specified aftercare: Secondary | ICD-10-CM | POA: Diagnosis not present

## 2021-10-21 NOTE — Progress Notes (Signed)
Daily Session Note  Patient Details  Name: Kayla Wu MRN: [id number] Date of Birth: 14-Aug-1945 Referring Provider:   Flowsheet Row Pulmonary Rehab from 09/18/2021 in [facility] Cardiac and Pulmonary Rehab  Referring Provider Lanney Gins       Encounter Date: 10/21/2021  Check In:  Session Check In - 10/21/21 1042       Check-In   Supervising physician immediately available to respond to emergencies See telemetry face sheet for immediately available ER MD    Location [facility]-Cardiac & Pulmonary Rehab    Staff Present Renita Papa, RN Moises Blood, BS, ACSM CEP, Exercise Physiologist;Noah Tickle, BS, Exercise Physiologist    Virtual Visit No    Medication changes reported     No    Fall or balance concerns reported    No    Warm-up and Cool-down Performed on first and last piece of equipment    Resistance Training Performed Yes    VAD Patient? No    PAD/SET Patient? No      Pain Assessment   Currently in Pain? No/denies                Social History   Tobacco Use  Smoking Status Former   Packs/day: 2.00   Years: 42.00   Total pack years: 84.00   Types: Cigarettes   Quit date: 07/05/2007   Years since quitting: 14.3  Smokeless Tobacco Never    Goals Met:  Independence with exercise equipment Exercise tolerated well No report of concerns or symptoms today Strength training completed today  Goals Unmet:  Not Applicable  Comments: Pt able to follow exercise prescription today without complaint.  Will continue to monitor for progression.    Dr. Emily Filbert is Medical Director for [place].  Dr. Ottie Glazier is Medical Director for [facility] Pulmonary Rehabilitation.

## 2021-10-28 ENCOUNTER — Encounter: Payer: Medicare HMO | Admitting: *Deleted

## 2021-10-28 DIAGNOSIS — U099 Post covid-19 condition, unspecified: Secondary | ICD-10-CM

## 2021-10-28 DIAGNOSIS — R0600 Dyspnea, unspecified: Secondary | ICD-10-CM | POA: Diagnosis not present

## 2021-10-28 DIAGNOSIS — Z5189 Encounter for other specified aftercare: Secondary | ICD-10-CM | POA: Diagnosis not present

## 2021-10-28 DIAGNOSIS — J849 Interstitial pulmonary disease, unspecified: Secondary | ICD-10-CM

## 2021-10-28 NOTE — Progress Notes (Signed)
Daily Session Note  Patient Details  Name: Kayla Wu MRN: [id number] Date of Birth: [birth year] Referring Provider:   Flowsheet Row Pulmonary Rehab from 09/18/2021 in [facility] Cardiac and Pulmonary Rehab  Referring Provider Lanney Gins       Encounter Date: 10/28/2021  Check In:  Session Check In - 10/28/21 1010       Check-In   Supervising physician immediately available to respond to emergencies See telemetry face sheet for immediately available ER MD    Location [facility]-Cardiac & Pulmonary Rehab    Staff Present Earlean Shawl, BS, ACSM CEP, Exercise Physiologist;Noah Tickle, BS, Exercise Physiologist;Other   Darlyne Russian, RN   Virtual Visit No    Medication changes reported     No    Fall or balance concerns reported    No    Warm-up and Cool-down Performed on first and last piece of equipment    Resistance Training Performed Yes    VAD Patient? No    PAD/SET Patient? No      Pain Assessment   Currently in Pain? No/denies                Social History   Tobacco Use  Smoking Status Former   Packs/day: 2.00   Years: 42.00   Total pack years: 84.00   Types: Cigarettes   Quit date: 07/05/2007   Years since quitting: 14.3  Smokeless Tobacco Never    Goals Met:  Independence with exercise equipment Exercise tolerated well No report of concerns or symptoms today Strength training completed today  Goals Unmet:  Not Applicable  Comments: Pt able to follow exercise prescription today without complaint.  Will continue to monitor for progression.    Dr. Emily Filbert is Medical Director for [place].  Dr. Ottie Glazier is Medical Director for [facility] Pulmonary Rehabilitation.

## 2021-10-29 DIAGNOSIS — J431 Panlobular emphysema: Secondary | ICD-10-CM | POA: Diagnosis not present

## 2021-10-29 DIAGNOSIS — Z79899 Other long term (current) drug therapy: Secondary | ICD-10-CM | POA: Diagnosis not present

## 2021-10-29 DIAGNOSIS — Z794 Long term (current) use of insulin: Secondary | ICD-10-CM | POA: Diagnosis not present

## 2021-10-29 DIAGNOSIS — E7800 Pure hypercholesterolemia, unspecified: Secondary | ICD-10-CM | POA: Diagnosis not present

## 2021-10-29 DIAGNOSIS — E118 Type 2 diabetes mellitus with unspecified complications: Secondary | ICD-10-CM | POA: Diagnosis not present

## 2021-10-29 DIAGNOSIS — E669 Obesity, unspecified: Secondary | ICD-10-CM | POA: Diagnosis not present

## 2021-10-30 ENCOUNTER — Encounter: Payer: Medicare HMO | Admitting: *Deleted

## 2021-10-30 DIAGNOSIS — U099 Post covid-19 condition, unspecified: Secondary | ICD-10-CM | POA: Diagnosis not present

## 2021-10-30 DIAGNOSIS — R0600 Dyspnea, unspecified: Secondary | ICD-10-CM | POA: Diagnosis not present

## 2021-10-30 DIAGNOSIS — J849 Interstitial pulmonary disease, unspecified: Secondary | ICD-10-CM | POA: Diagnosis not present

## 2021-10-30 DIAGNOSIS — Z5189 Encounter for other specified aftercare: Secondary | ICD-10-CM | POA: Diagnosis not present

## 2021-10-30 NOTE — Progress Notes (Signed)
Daily Session Note  Patient Details  Name: Kayla Wu MRN: [id number] Date of Birth: 04-13-1945 Referring Provider:   Flowsheet Row Pulmonary Rehab from 09/18/2021 in [facility] Cardiac and Pulmonary Rehab  Referring Provider Lanney Gins       Encounter Date: 10/30/2021  Check In:  Session Check In - 10/30/21 1044       Check-In   Supervising physician immediately available to respond to emergencies See telemetry face sheet for immediately available ER MD    Location [facility]-Cardiac & Pulmonary Rehab    Staff Present Justin Mend, RCP,RRT,BSRT;Noah Tickle, BS, Exercise Physiologist;Other   Darlyne Russian, RN   Virtual Visit No    Medication changes reported     No    Fall or balance concerns reported    No    Warm-up and Cool-down Performed on first and last piece of equipment    Resistance Training Performed Yes    VAD Patient? No    PAD/SET Patient? No      Pain Assessment   Currently in Pain? No/denies                Social History   Tobacco Use  Smoking Status Former   Packs/day: 2.00   Years: 42.00   Total pack years: 84.00   Types: Cigarettes   Quit date: 07/05/2007   Years since quitting: 14.3  Smokeless Tobacco Never    Goals Met:  Independence with exercise equipment Exercise tolerated well No report of concerns or symptoms today Strength training completed today  Goals Unmet:  Not Applicable  Comments: Pt able to follow exercise prescription today without complaint.  Will continue to monitor for progression.    Dr. Emily Filbert is Medical Director for [place].  Dr. Ottie Glazier is Medical Director for [facility] Pulmonary Rehabilitation.

## 2021-11-01 ENCOUNTER — Encounter: Payer: Medicare HMO | Admitting: *Deleted

## 2021-11-01 DIAGNOSIS — R0600 Dyspnea, unspecified: Secondary | ICD-10-CM | POA: Diagnosis not present

## 2021-11-01 DIAGNOSIS — J849 Interstitial pulmonary disease, unspecified: Secondary | ICD-10-CM | POA: Diagnosis not present

## 2021-11-01 DIAGNOSIS — U099 Post covid-19 condition, unspecified: Secondary | ICD-10-CM

## 2021-11-01 DIAGNOSIS — Z5189 Encounter for other specified aftercare: Secondary | ICD-10-CM | POA: Diagnosis not present

## 2021-11-01 NOTE — Progress Notes (Signed)
Daily Session Note  Patient Details  Name: Kayla Wu MRN: [id number] Date of Birth: 09-23-1945 Referring Provider:   Flowsheet Row Pulmonary Rehab from 09/18/2021 in [facility] Cardiac and Pulmonary Rehab  Referring Provider Lanney Gins       Encounter Date: 11/01/2021  Check In:  Session Check In - 11/01/21 1005       Check-In   Supervising physician immediately available to respond to emergencies See telemetry face sheet for immediately available ER MD    Location [facility]-Cardiac & Pulmonary Rehab    Staff Present Heath Lark, RN, BSN, CCRP;Jessica [place], MA, RCEP, CCRP, CCET;Joseph [place], [place]    Virtual Visit No    Medication changes reported     No    Fall or balance concerns reported    No    Warm-up and Cool-down Performed on first and last piece of equipment    Resistance Training Performed Yes    VAD Patient? No    PAD/SET Patient? No      Pain Assessment   Currently in Pain? No/denies                Social History   Tobacco Use  Smoking Status Former   Packs/day: 2.00   Years: 42.00   Total pack years: 84.00   Types: Cigarettes   Quit date: 07/05/2007   Years since quitting: 14.3  Smokeless Tobacco Never    Goals Met:  Proper associated with RPD/PD & O2 Sat Independence with exercise equipment Exercise tolerated well No report of concerns or symptoms today  Goals Unmet:  Not Applicable  Comments: Pt able to follow exercise prescription today without complaint.  Will continue to monitor for progression.    Dr. Emily Filbert is Medical Director for [place].  Dr. Ottie Glazier is Medical Director for [facility] Pulmonary Rehabilitation.

## 2021-11-02 DIAGNOSIS — Z794 Long term (current) use of insulin: Secondary | ICD-10-CM | POA: Diagnosis not present

## 2021-11-02 DIAGNOSIS — E118 Type 2 diabetes mellitus with unspecified complications: Secondary | ICD-10-CM | POA: Diagnosis not present

## 2021-11-04 ENCOUNTER — Encounter: Payer: Medicare HMO | Attending: Pulmonary Disease | Admitting: *Deleted

## 2021-11-04 DIAGNOSIS — J849 Interstitial pulmonary disease, unspecified: Secondary | ICD-10-CM | POA: Diagnosis not present

## 2021-11-04 DIAGNOSIS — U099 Post covid-19 condition, unspecified: Secondary | ICD-10-CM | POA: Diagnosis not present

## 2021-11-04 DIAGNOSIS — R0600 Dyspnea, unspecified: Secondary | ICD-10-CM | POA: Insufficient documentation

## 2021-11-04 NOTE — Progress Notes (Signed)
Daily Session Note  Patient Details  Name: Kayla Wu MRN: [id number] Date of Birth: [birth year] Referring Provider:   Flowsheet Row Pulmonary Rehab from 09/18/2021 in [facility] Cardiac and Pulmonary Rehab  Referring Provider Lanney Gins       Encounter Date: 11/04/2021  Check In:  Session Check In - 11/04/21 1008       Check-In   Supervising physician immediately available to respond to emergencies See telemetry face sheet for immediately available ER MD    Location [facility]-Cardiac & Pulmonary Rehab    Staff Present Earlean Shawl, BS, ACSM CEP, Exercise Physiologist;Noah Tickle, BS, Exercise Physiologist;Winnie Barsky Tamala Julian, RN, ADN    Virtual Visit No    Medication changes reported     No    Fall or balance concerns reported    No    Warm-up and Cool-down Performed on first and last piece of equipment    Resistance Training Performed Yes    VAD Patient? No    PAD/SET Patient? No      Pain Assessment   Currently in Pain? No/denies                Social History   Tobacco Use  Smoking Status Former   Packs/day: 2.00   Years: 42.00   Total pack years: 84.00   Types: Cigarettes   Quit date: 07/05/2007   Years since quitting: 14.3  Smokeless Tobacco Never    Goals Met:  Independence with exercise equipment Exercise tolerated well No report of concerns or symptoms today Strength training completed today  Goals Unmet:  Not Applicable  Comments: Pt able to follow exercise prescription today without complaint.  Will continue to monitor for progression.    Dr. Emily Filbert is Medical Director for [place].  Dr. Ottie Glazier is Medical Director for [facility] Pulmonary Rehabilitation.

## 2021-11-06 ENCOUNTER — Encounter: Payer: Medicare HMO | Admitting: *Deleted

## 2021-11-06 ENCOUNTER — Encounter: Payer: Self-pay | Admitting: *Deleted

## 2021-11-06 DIAGNOSIS — R0600 Dyspnea, unspecified: Secondary | ICD-10-CM | POA: Diagnosis not present

## 2021-11-06 DIAGNOSIS — U099 Post covid-19 condition, unspecified: Secondary | ICD-10-CM

## 2021-11-06 DIAGNOSIS — J849 Interstitial pulmonary disease, unspecified: Secondary | ICD-10-CM

## 2021-11-06 NOTE — Progress Notes (Signed)
Pulmonary Individual Treatment Plan  Patient Details  Name: Kayla Wu MRN: [id number] Date of Birth: 1945/05/28 Referring Provider:   Flowsheet Row Pulmonary Rehab from 09/18/2021 in [facility] Cardiac and Pulmonary Rehab  Referring Provider Lanney Gins       Initial Encounter Date:  Flowsheet Row Pulmonary Rehab from 09/18/2021 in [facility] Cardiac and Pulmonary Rehab  Date 09/18/21       Visit Diagnosis: Post covid-19 condition, unspecified  Dyspnea, unspecified type  ILD (interstitial lung disease) ([place])  Patient's Home Medications on Admission:  Current Outpatient Medications:    albuterol (VENTOLIN HFA) 108 (90 Base) MCG/ACT inhaler, Inhale into the lungs., Disp: , Rfl:    aspirin EC 81 MG tablet, Take by mouth., Disp: , Rfl:    atorvastatin (LIPITOR) 20 MG tablet, Take 1 tablet by mouth daily., Disp: , Rfl:    bisoprolol (ZEBETA) 5 MG tablet, Take 1 tablet (5 mg total) by mouth daily., Disp: 90 tablet, Rfl: 3   diltiazem (CARDIZEM CD) 180 MG 24 hr capsule, Take 1 capsule (180 mg total) by mouth daily., Disp: 90 capsule, Rfl: 0   diphenhydrAMINE (BENADRYL) 50 MG capsule, Take 1 capsule 1 hour prior to procedure, Disp: 1 capsule, Rfl: 0   ferrous sulfate 325 (65 FE) MG tablet, Take 325 mg by mouth daily with breakfast., Disp: , Rfl:    Fluticasone-Umeclidin-Vilant (TRELEGY ELLIPTA) 100-62.5-25 MCG/ACT AEPB, Inhale into the lungs., Disp: , Rfl:    gabapentin (NEURONTIN) 600 MG tablet, Take 600 mg by mouth at bedtime., Disp: , Rfl:    hydrOXYzine (ATARAX) 50 MG tablet, , Disp: , Rfl:    insulin NPH-regular Human (70-30) 100 UNIT/ML injection, Inject into the skin as needed. (Patient not taking: Reported on 09/03/2021), Disp: , Rfl:    LORazepam (ATIVAN) 1 MG tablet, Take 1 mg by mouth 2 (two) times daily as needed for anxiety or sleep., Disp: , Rfl:    losartan (COZAAR) 100 MG tablet, Take 100 mg by mouth daily., Disp: , Rfl:    magnesium oxide (MAG-OX) 400 MG tablet, Take 400 mg by  mouth 2 (two) times daily., Disp: , Rfl:    metFORMIN (GLUCOPHAGE-XR) 500 MG 24 hr tablet, Take 1,500 mg by mouth daily with supper., Disp: , Rfl:    metoprolol tartrate (LOPRESSOR) 25 MG tablet, Take 1 tablet (25 mg total) by mouth once for 1 dose. Take 2 hours prior to your CT., Disp: 1 tablet, Rfl: 0   omeprazole (PRILOSEC) 40 MG capsule, Take 1 capsule by mouth daily., Disp: , Rfl:    predniSONE (DELTASONE) 50 MG tablet, Take 50 mg 13 hours, 7 hour, and 1 hour prior to procedure, Disp: 3 tablet, Rfl: 0   TRULICITY 1.5 VQ/0.0QQ SOPN, Inject 1.5 mg into the skin once a week., Disp: , Rfl:    venlafaxine XR (EFFEXOR-XR) 150 MG 24 hr capsule, Take 150 mg by mouth daily. (take with 37.64m capsule to equal 187.539mtotal), Disp: , Rfl:    venlafaxine XR (EFFEXOR-XR) 37.5 MG 24 hr capsule, Take 37.5 mg by mouth daily. (take with 15064mapsule to equal 187.5mg58mtal), Disp: , Rfl:    vitamin B-12 (CYANOCOBALAMIN) 1000 MCG tablet, Take 1,000 mcg by mouth daily., Disp: , Rfl:   Past Medical History: Past Medical History:  Diagnosis Date   Atrial fibrillation with RVR (HCC)[place]/27/2022   Chronic urticaria    COPD (chronic obstructive pulmonary disease) (HCC)[place] COVID-19 virus infection 06/2020   DDD (degenerative disc disease)  Diabetes mellitus ([place])    Diastolic dysfunction    a. 01/2018 Echo: Nl EF. Triv AI/MR/PR. Mild TR; b. 06/2020 Echo: EF 60-65%, no rwma, gr1 DD. Nl RV size/fxn. RVSP 14.32mHg.   GERD (gastroesophageal reflux disease)    Hepatic steatosis    Hypertension    Menopausal state    Morbid obesity ([place]    Non-cardiac chest pain    a. 07/2014 MV: EF 75%, no ischemia/artifact.   [place]varian cancer ([place]   s/p chemo    PAF (paroxysmal atrial fibrillation) ([place]    a. Dx 06/2020 in setting of COVID infection; b. CHA2DS2VASc = 5-->eliquis.   Personal history of chemotherapy 2012   ovarian   Pneumonia 1987   [place]ulmonary [place]us [place]    Rectovaginal fistula     Tobacco  Use: Social History   Tobacco Use  Smoking Status Former   Packs/day: 2.00   Years: 42.00   Total pack years: 84.00   Types: Cigarettes   Quit date: 07/05/2007   Years since quitting: 14.3  Smokeless Tobacco Never    Labs: Review Flowsheet       Latest Ref Rng & Units 01/19/2020 06/25/2020 06/26/2020  Labs for ITP Cardiac and Pulmonary Rehab  Trlycerides <150 mg/dL - 110  -  Hemoglobin A1c 4.8 - 5.6 % 6.8  - 7.4      Pulmonary Assessment Scores:  Pulmonary Assessment Scores     Row Name 09/18/21 1537         ADL UCSD   ADL Phase Entry     SOB Score total 76     Rest 1     Walk 2     Stairs 5     Bath 2     Dress 1     Shop 5       CAT Score   CAT Score 21       mMRC Score   mMRC Score 2              UCSD: Self-administered rating of dyspnea associated with activities of daily living (ADLs) 6-point scale (0 = "not at all" to 5 = "maximal or unable to do because of breathlessness")  Scoring Scores range from 0 to 120.  Minimally important difference is 5 units  CAT: CAT can identify the health impairment of COPD patients and is better correlated with disease progression.  CAT has a scoring range of zero to 40. The CAT score is classified into four groups of low (less than 10), medium (10 - 20), high (21-30) and very high (31-40) based on the impact level of disease on health status. A CAT score over 10 suggests significant symptoms.  A worsening CAT score could be explained by an exacerbation, poor medication adherence, poor inhaler technique, or progression of COPD or comorbid conditions.  CAT MCID is 2 points  mMRC: mMRC (Modified Medical Research Council) Dyspnea Scale is used to assess the degree of baseline functional disability in patients of respiratory disease due to dyspnea. No minimal important difference is established. A decrease in score of 1 point or greater is considered a positive change.   Pulmonary Function Assessment:   Exercise  Target Goals: Exercise Program Goal: Individual exercise prescription set using results from initial 6 min walk test and THRR while considering  patient's activity barriers and safety.   Exercise Prescription Goal: Initial exercise prescription builds to 30-45 minutes a day of aerobic activity, 2-3 days per week.  Home exercise guidelines  will be given to patient during program as part of exercise prescription that the participant will acknowledge.  Education: Aerobic Exercise: - Group verbal and visual presentation on the components of exercise prescription. Introduces F.I.T.T principle from ACSM for exercise prescriptions.  Reviews F.I.T.T. principles of aerobic exercise including progression. Written material given at graduation. Flowsheet Row Pulmonary Rehab from 09/18/2021 in [facility] Cardiac and Pulmonary Rehab  Education need identified 09/18/21       Education: Resistance Exercise: - Group verbal and visual presentation on the components of exercise prescription. Introduces F.I.T.T principle from ACSM for exercise prescriptions  Reviews F.I.T.T. principles of resistance exercise including progression. Written material given at graduation.    Education: Exercise & Equipment Safety: - Individual verbal instruction and demonstration of equipment use and safety with use of the equipment. Flowsheet Row Pulmonary Rehab from 09/18/2021 in [facility] Cardiac and Pulmonary Rehab  Date 09/18/21  Educator NT  Instruction Review Code 1- Verbalizes Understanding       Education: Exercise Physiology & General Exercise Guidelines: - Group verbal and written instruction with models to review the exercise physiology of the cardiovascular system and associated critical values. Provides general exercise guidelines with specific guidelines to those with heart or lung disease.  Flowsheet Row Pulmonary Rehab from 09/18/2021 in [facility] Cardiac and Pulmonary Rehab  Education need identified 09/18/21        Education: Flexibility, Balance, Mind/Body Relaxation: - Group verbal and visual presentation with interactive activity on the components of exercise prescription. Introduces F.I.T.T principle from ACSM for exercise prescriptions. Reviews F.I.T.T. principles of flexibility and balance exercise training including progression. Also discusses the mind body connection.  Reviews various relaxation techniques to help reduce and manage stress (i.e. Deep breathing, progressive muscle relaxation, and visualization). Balance handout provided to take home. Written material given at graduation.   Activity Barriers & Risk Stratification:  Activity Barriers & Cardiac Risk Stratification - 09/18/21 1516       Activity Barriers & Cardiac Risk Stratification   Activity Barriers Shortness of Breath             6 Minute Walk:  6 Minute Walk     Row Name 09/18/21 1510         6 Minute Walk   Phase Initial     Distance 950 feet     Walk Time 5.53 minutes     # of Rest Breaks 2     MPH 1.95     METS 1.55     RPE 13     Perceived Dyspnea  3     VO2 Peak 5.44     Symptoms Yes (comment)     Comments SOB     Resting HR 59 bpm     Resting BP 118/62     Resting Oxygen Saturation  94 %     Exercise Oxygen Saturation  during 6 min walk 88 %     Max Ex. HR 86 bpm     Max Ex. BP 144/72     2 Minute Post BP 132/68       Interval HR   1 Minute HR 84     2 Minute HR 86     3 Minute HR 86     4 Minute HR 85     5 Minute HR 86     6 Minute HR 86     2 Minute Post HR 64     Interval Heart Rate? Yes  Interval Oxygen   Interval Oxygen? Yes     Baseline Oxygen Saturation % 94 %     1 Minute Oxygen Saturation % 91 %     1 Minute Liters of Oxygen 0 L  RA     2 Minute Oxygen Saturation % 88 %     2 Minute Liters of Oxygen 0 L  RA     3 Minute Oxygen Saturation % 90 %     3 Minute Liters of Oxygen 0 L  RA     4 Minute Oxygen Saturation % 92 %     4 Minute Liters of Oxygen 0 L  RA     5  Minute Oxygen Saturation % 91 %     5 Minute Liters of Oxygen 0 L  RA     6 Minute Oxygen Saturation % 90 %     6 Minute Liters of Oxygen 0 L  RA     2 Minute Post Oxygen Saturation % 93 %     2 Minute Post Liters of Oxygen 0 L  RA             Oxygen Initial Assessment:  Oxygen Initial Assessment - 09/16/21 1422       Home Oxygen   Home Oxygen Device None    Sleep Oxygen Prescription None    Home Exercise Oxygen Prescription None    Home Resting Oxygen Prescription None      Initial 6 min Walk   Oxygen Used None      Program Oxygen Prescription   Program Oxygen Prescription None      Intervention   Short Term Goals To learn and demonstrate proper pursed lip breathing techniques or other breathing techniques. ;To learn and demonstrate proper use of respiratory medications;To learn and understand importance of maintaining oxygen saturations>88%    Long  Term Goals Verbalizes importance of monitoring SPO2 with pulse oximeter and return demonstration;Maintenance of O2 saturations>88%;Exhibits proper breathing techniques, such as pursed lip breathing or other method taught during program session;Demonstrates proper use of MDI's;Compliance with respiratory medication             Oxygen Re-Evaluation:  Oxygen Re-Evaluation     Row Name 10/09/21 1154 10/28/21 1027           Program Oxygen Prescription   Program Oxygen Prescription None None        Home Oxygen   Home Oxygen Device None None      Sleep Oxygen Prescription None None      Home Exercise Oxygen Prescription None None      Home Resting Oxygen Prescription None None        Goals/Expected Outcomes   Short Term Goals To learn and demonstrate proper pursed lip breathing techniques or other breathing techniques.  To learn and demonstrate proper pursed lip breathing techniques or other breathing techniques.       Long  Term Goals Exhibits proper breathing techniques, such as pursed lip breathing or other method  taught during program session Exhibits proper breathing techniques, such as pursed lip breathing or other method taught during program session      Comments Reviewed PLB technique with pt.  Talked about how it works and it's importance in maintaining their exercise saturations. We reviewed PLB technique and she was encouraged to practice. She states that she used to be on oxygen but her saturations were staying around 94% so she was able to come off. She does have a  pulse-ox at home and states that her oxygen saturations have stayed up at home.      Goals/Expected Outcomes Short: Become more profiecient at using PLB.   Long: Become independent at using PLB. Short: Become more profiecient at using PLB.   Long: Become independent at using PLB.               Oxygen Discharge (Final Oxygen Re-Evaluation):  Oxygen Re-Evaluation - 10/28/21 1027       Program Oxygen Prescription   Program Oxygen Prescription None      Home Oxygen   Home Oxygen Device None    Sleep Oxygen Prescription None    Home Exercise Oxygen Prescription None    Home Resting Oxygen Prescription None      Goals/Expected Outcomes   Short Term Goals To learn and demonstrate proper pursed lip breathing techniques or other breathing techniques.     Long  Term Goals Exhibits proper breathing techniques, such as pursed lip breathing or other method taught during program session    Comments We reviewed PLB technique and she was encouraged to practice. She states that she used to be on oxygen but her saturations were staying around 94% so she was able to come off. She does have a pulse-ox at home and states that her oxygen saturations have stayed up at home.    Goals/Expected Outcomes Short: Become more profiecient at using PLB.   Long: Become independent at using PLB.             Initial Exercise Prescription:  Initial Exercise Prescription - 09/18/21 1500       Date of Initial Exercise RX and Referring Provider   Date  09/18/21    Referring Provider Aleskerov      Oxygen   Maintain Oxygen Saturation 88% or higher      Treadmill   MPH 1.5    Grade 0    Minutes 15    METs 2.15      NuStep   Level 1    SPM 80    Minutes 15    METs 1.55      T5 Nustep   Level 1    SPM 80    Minutes 15    METs 1.55      Biostep-RELP   Level 1    SPM 60    Minutes 15    METs 1.55      Track   Laps 15    Minutes 15    METs 1.82      Prescription Details   Frequency (times per week) 3    Duration Progress to 30 minutes of continuous aerobic without signs/symptoms of physical distress      Intensity   THRR 40-80% of Max Heartrate 93-127    Ratings of Perceived Exertion 11-15    Perceived Dyspnea 0-4      Progression   Progression Continue to progress workloads to maintain intensity without signs/symptoms of physical distress.      Resistance Training   Training Prescription Yes    Weight 3 lb    Reps 10-15             Perform Capillary Blood Glucose checks as needed.  Exercise Prescription Changes:   Exercise Prescription Changes     Row Name 09/18/21 1500 10/22/21 0900 11/04/21 1300         Response to Exercise   Blood Pressure (Admit) 118/62 110/62 122/70     Blood Pressure (  Exercise) 144/72 124/74 152/84     Blood Pressure (Exit) 120/64 110/68 122/64     Heart Rate (Admit) 59 bpm 59 bpm 68 bpm     Heart Rate (Exercise) 86 bpm 83 bpm 86 bpm     Heart Rate (Exit) 63 bpm 71 bpm 63 bpm     Oxygen Saturation (Admit) 94 % 92 % 92 %     Oxygen Saturation (Exercise) 88 % 92 % 88 %     Oxygen Saturation (Exit) 91 % 92 % 90 %     Rating of Perceived Exertion (Exercise) '13 15 15     '$ Perceived Dyspnea (Exercise) '3 3 3     '$ Symptoms SOB SOB SOB     Comments Completed 6MWT -- --     Duration Progress to 30 minutes of  aerobic without signs/symptoms of physical distress Progress to 30 minutes of  aerobic without signs/symptoms of physical distress Progress to 30 minutes of  aerobic  without signs/symptoms of physical distress     Intensity THRR unchanged THRR unchanged THRR unchanged       Progression   Progression -- Continue to progress workloads to maintain intensity without signs/symptoms of physical distress. Continue to progress workloads to maintain intensity without signs/symptoms of physical distress.     Average METs -- 2.46 2.34       Resistance Training   Training Prescription -- Yes Yes     Weight -- 3 lb 3 lb     Reps -- 10-15 10-15       Interval Training   Interval Training -- No No       Treadmill   MPH -- 1.5 1.8     Grade -- 0 0     Minutes -- 15 15     METs -- 2.15 2.38       NuStep   Level -- 2 --     Minutes -- 15 --     METs -- 2.6 --       T5 Nustep   Level -- 3 --     Minutes -- 15 --       Biostep-RELP   Level -- -- 2     Minutes -- -- 15     METs -- -- 3       Track   Laps -- 30 30     Minutes -- 15 15     METs -- 2.63 2.63       Oxygen   Maintain Oxygen Saturation -- 88% or higher 88% or higher              Exercise Comments:   Exercise Comments     Row Name 10/09/21 1154           Exercise Comments First full day of exercise!  Patient was oriented to gym and equipment including functions, settings, policies, and procedures.  Patient's individual exercise prescription and treatment plan were reviewed.  All starting workloads were established based on the results of the 6 minute walk test done at initial orientation visit.  The plan for exercise progression was also introduced and progression will be customized based on patient's performance and goals.                Exercise Goals and Review:   Exercise Goals     Row Name 09/18/21 1528             Exercise Goals   Increase Physical Activity  Yes       Intervention Provide advice, education, support and counseling about physical activity/exercise needs.;Develop an individualized exercise prescription for aerobic and resistive training based  on initial evaluation findings, risk stratification, comorbidities and participant's personal goals.       Expected Outcomes Short Term: Attend rehab on a regular basis to increase amount of physical activity.;Long Term: Add in home exercise to make exercise part of routine and to increase amount of physical activity.;Long Term: Exercising regularly at least 3-5 days a week.       Increase Strength and Stamina Yes       Intervention Provide advice, education, support and counseling about physical activity/exercise needs.;Develop an individualized exercise prescription for aerobic and resistive training based on initial evaluation findings, risk stratification, comorbidities and participant's personal goals.       Expected Outcomes Short Term: Increase workloads from initial exercise prescription for resistance, speed, and METs.;Long Term: Improve cardiorespiratory fitness, muscular endurance and strength as measured by increased METs and functional capacity (6MWT);Short Term: Perform resistance training exercises routinely during rehab and add in resistance training at home       Able to understand and use rate of perceived exertion (RPE) scale Yes       Intervention Provide education and explanation on how to use RPE scale       Expected Outcomes Short Term: Able to use RPE daily in rehab to express subjective intensity level;Long Term:  Able to use RPE to guide intensity level when exercising independently       Able to understand and use Dyspnea scale Yes       Intervention Provide education and explanation on how to use Dyspnea scale       Expected Outcomes Short Term: Able to use Dyspnea scale daily in rehab to express subjective sense of shortness of breath during exertion;Long Term: Able to use Dyspnea scale to guide intensity level when exercising independently       Knowledge and understanding of Target Heart Rate Range (THRR) Yes       Intervention Provide education and explanation of THRR  including how the numbers were predicted and where they are located for reference       Expected Outcomes Short Term: Able to state/look up THRR;Long Term: Able to use THRR to govern intensity when exercising independently;Short Term: Able to use daily as guideline for intensity in rehab       Able to check pulse independently Yes       Intervention Review the importance of being able to check your own pulse for safety during independent exercise;Provide education and demonstration on how to check pulse in carotid and radial arteries.       Expected Outcomes Short Term: Able to explain why pulse checking is important during independent exercise;Long Term: Able to check pulse independently and accurately       Understanding of Exercise Prescription Yes       Intervention Provide education, explanation, and written materials on patient's individual exercise prescription       Expected Outcomes Short Term: Able to explain program exercise prescription;Long Term: Able to explain home exercise prescription to exercise independently                Exercise Goals Re-Evaluation :  Exercise Goals Re-Evaluation     Row Name 10/09/21 1155 10/22/21 0906 10/28/21 1012 11/04/21 1324       Exercise Goal Re-Evaluation   Exercise Goals Review Able to understand and  use rate of perceived exertion (RPE) scale;Knowledge and understanding of Target Heart Rate Range (THRR);Able to understand and use Dyspnea scale;Understanding of Exercise Prescription Increase Physical Activity;Increase Strength and Stamina;Understanding of Exercise Prescription Increase Physical Activity;Increase Strength and Stamina;Understanding of Exercise Prescription Increase Physical Activity;Increase Strength and Stamina;Understanding of Exercise Prescription    Comments Reviewed RPE and dyspnea scales, THR and program prescription with pt today.  Pt voiced understanding and was given a copy of goals to take home. Kayla Wu is doing well in  rehab.  She is on level 3 on the T5 NuStep and walking 30 laps. We will continue to monitor her progress. Kayla Wu is doing well in rehab. She feels that she is progressing well in the program. She states that she has definitely seen some improvement especially with her endurance. She has progressed to be able to do the treadmill and not just the track. She has been walking at home on her days away from rehab. She states that she usually walks about 20 min at home. We will continue to monitor her progress. Kayla Wu continues to do well in rehab. She has gotten up to 30 laps walking on the track. She has also increased her workload on the treadmill to a speed of 1.8 mph with no incline. Kayla Wu would benefit from adding in some incline on the treadmill as tolerated. We will continue to monitor her progress.    Expected Outcomes Short: Use RPE daily to regulate intensity. Long: Follow program prescription in THR. Short: Increase seated equipment workloads Long: conitnue to improve stamina Short: Continue to walk on days away from rehab. Long: Continue to increase strength and stamina. Short: Add incline on the treadmill. Long: Continue to increase strength and stamina.             Discharge Exercise Prescription (Final Exercise Prescription Changes):  Exercise Prescription Changes - 11/04/21 1300       Response to Exercise   Blood Pressure (Admit) 122/70    Blood Pressure (Exercise) 152/84    Blood Pressure (Exit) 122/64    Heart Rate (Admit) 68 bpm    Heart Rate (Exercise) 86 bpm    Heart Rate (Exit) 63 bpm    Oxygen Saturation (Admit) 92 %    Oxygen Saturation (Exercise) 88 %    Oxygen Saturation (Exit) 90 %    Rating of Perceived Exertion (Exercise) 15    Perceived Dyspnea (Exercise) 3    Symptoms SOB    Duration Progress to 30 minutes of  aerobic without signs/symptoms of physical distress    Intensity THRR unchanged      Progression   Progression Continue to progress workloads to  maintain intensity without signs/symptoms of physical distress.    Average METs 2.34      Resistance Training   Training Prescription Yes    Weight 3 lb    Reps 10-15      Interval Training   Interval Training No      Treadmill   MPH 1.8    Grade 0    Minutes 15    METs 2.38      Biostep-RELP   Level 2    Minutes 15    METs 3      Track   Laps 30    Minutes 15    METs 2.63      Oxygen   Maintain Oxygen Saturation 88% or higher             Nutrition:  Target Goals:  Understanding of nutrition guidelines, daily intake of sodium '1500mg'$ , cholesterol '200mg'$ , calories 30% from fat and 7% or less from saturated fats, daily to have 5 or more servings of fruits and vegetables.  Education: All About Nutrition: -Group instruction provided by verbal, written material, interactive activities, discussions, models, and posters to present general guidelines for heart healthy nutrition including fat, fiber, MyPlate, the role of sodium in heart healthy nutrition, utilization of the nutrition label, and utilization of this knowledge for meal planning. Follow up email sent as well. Written material given at graduation.   Biometrics:  Pre Biometrics - 09/18/21 1528       Pre Biometrics   Height 5' 4.5" (1.638 m)    Weight 196 lb 6.4 oz (89.1 kg)    Waist Circumference 41.5 inches    Hip Circumference 45.5 inches    Waist to Hip Ratio 0.91 %    BMI (Calculated) 33.2    Single Leg Stand 2.71 seconds              Nutrition Therapy Plan and Nutrition Goals:  Nutrition Therapy & Goals - 09/18/21 1533       Intervention Plan   Intervention Prescribe, educate and counsel regarding individualized specific dietary modifications aiming towards targeted core components such as weight, hypertension, lipid management, diabetes, heart failure and other comorbidities.    Expected Outcomes Short Term Goal: Understand basic principles of dietary content, such as calories, fat, sodium,  cholesterol and nutrients.;Short Term Goal: A plan has been developed with personal nutrition goals set during dietitian appointment.;Long Term Goal: Adherence to prescribed nutrition plan.             Nutrition Assessments:  MEDIFICTS Score Key: ?70 Need to make dietary changes  40-70 Heart Healthy Diet ? 40 Therapeutic Level Cholesterol Diet  Flowsheet Row Pulmonary Rehab from 09/18/2021 in [facility] Cardiac and Pulmonary Rehab  Picture Your Plate Total Score on Admission 48      Picture Your Plate Scores: <16 Unhealthy dietary pattern with much room for improvement. 41-50 Dietary pattern unlikely to meet recommendations for good health and room for improvement. 51-60 More healthful dietary pattern, with some room for improvement.  >60 Healthy dietary pattern, although there may be some specific behaviors that could be improved.   Nutrition Goals Re-Evaluation:  Nutrition Goals Re-Evaluation     Kayla Wu Name 10/28/21 1022             Goals   Comment Kayla Wu has deferred to speak with the RD at this time.                Nutrition Goals Discharge (Final Nutrition Goals Re-Evaluation):  Nutrition Goals Re-Evaluation - 10/28/21 1022       Goals   Comment Kayla Wu has deferred to speak with the RD at this time.             Psychosocial: Target Goals: Acknowledge presence or absence of significant depression and/or stress, maximize coping skills, provide positive support system. Participant is able to verbalize types and ability to use techniques and skills needed for reducing stress and depression.   Education: Stress, Anxiety, and Depression - Group verbal and visual presentation to define topics covered.  Reviews how body is impacted by stress, anxiety, and depression.  Also discusses healthy ways to reduce stress and to treat/manage anxiety and depression.  Written material given at graduation.   Education: Sleep Hygiene -Provides group verbal and written  instruction about how sleep can affect your  health.  Define sleep hygiene, discuss sleep cycles and impact of sleep habits. Review good sleep hygiene tips.    Initial Review & Psychosocial Screening:  Initial Psych Review & Screening - 09/16/21 1423       Initial Review   Current issues with Current Depression;Current Anxiety/Panic;Current Psychotropic Meds      Family Dynamics   Good Support System? Yes   Kayla Wu, husband     Barriers   Psychosocial barriers to participate in program There are no identifiable barriers or psychosocial needs.      Screening Interventions   Interventions Encouraged to exercise;To provide support and resources with identified psychosocial needs;Provide feedback about the scores to participant    Expected Outcomes Short Term goal: Utilizing psychosocial counselor, staff and physician to assist with identification of specific Stressors or current issues interfering with healing process. Setting desired goal for each stressor or current issue identified.;Long Term Goal: Stressors or current issues are controlled or eliminated.;Short Term goal: Identification and review with participant of any Quality of Life or Depression concerns found by scoring the questionnaire.;Long Term goal: The participant improves quality of Life and PHQ9 Scores as seen by post scores and/or verbalization of changes             Quality of Life Scores:  Scores of 19 and below usually indicate a poorer quality of life in these areas.  A difference of  2-3 points is a clinically meaningful difference.  A difference of 2-3 points in the total score of the Quality of Life Index has been associated with significant improvement in overall quality of life, self-image, physical symptoms, and general health in studies assessing change in quality of life.  PHQ-9: Review Flowsheet       11/04/2021 10/11/2021 09/18/2021  Depression screen PHQ 2/9  Decreased Interest '1 1 1  '$ Down, Depressed,  Hopeless 1 0 2  PHQ - 2 Score '2 1 3  '$ Altered sleeping 1 0 2  Tired, decreased energy '1 1 3  '$ Change in appetite 1 0 3  Feeling bad or failure about yourself  1 1 0  Trouble concentrating '1 2 3  '$ Moving slowly or fidgety/restless 1 0 1  Suicidal thoughts 0 0 2  PHQ-9 Score '8 5 17  '$ Difficult doing work/chores Not difficult at all Not difficult at all Somewhat difficult   Interpretation of Total Score  Total Score Depression Severity:  1-4 = Minimal depression, 5-9 = Mild depression, 10-14 = Moderate depression, 15-19 = Moderately severe depression, 20-27 = Severe depression   Psychosocial Evaluation and Intervention:  Psychosocial Evaluation - 09/16/21 1427       Psychosocial Evaluation & Interventions   Interventions Encouraged to exercise with the program and follow exercise prescription    Comments Kayla Wu has no barriers to attending the program. She lives with her husband Kayla Wu is her support. She wants to gain more stamina and decrease her shortness of breath. She is on effoxor fro some depression. She is managing well with her meds. She is ready to get started.    Expected Outcomes STG Kayla Wu is able to attend all scheduled sessions with progess in her exerciseprescription and improvement with her shortness of breath symptoms. LTG Kayla Wu with progression of her exercise after discharge    Continue Psychosocial Services  Follow up required by staff             Psychosocial Re-Evaluation:  Psychosocial Re-Evaluation     Kayla Wu Name 10/11/21 1025  Psychosocial Re-Evaluation   Current issues with Current Depression;Current Anxiety/Panic       Expected Outcomes Reviewed patient health questionnaire (PHQ-9) with patient for follow up. Previously, patients score indicated signs/symptoms of depression.  Reviewed to see if patient is improving symptom wise while in program.  Score improved and patient states that it is because they have been moving more. Her  biggest issue is lack of energy and not being able to focus.       Interventions Encouraged to attend Pulmonary Rehabilitation for the exercise;Stress management education       Continue Psychosocial Services  Follow up required by staff                Psychosocial Discharge (Final Psychosocial Re-Evaluation):  Psychosocial Re-Evaluation - 10/11/21 1025       Psychosocial Re-Evaluation   Current issues with Current Depression;Current Anxiety/Panic    Expected Outcomes Reviewed patient health questionnaire (PHQ-9) with patient for follow up. Previously, patients score indicated signs/symptoms of depression.  Reviewed to see if patient is improving symptom wise while in program.  Score improved and patient states that it is because they have been moving more. Her biggest issue is lack of energy and not being able to focus.    Interventions Encouraged to attend Pulmonary Rehabilitation for the exercise;Stress management education    Continue Psychosocial Services  Follow up required by staff             Education: Education Goals: Education classes will be provided on a weekly basis, covering required topics. Participant will state understanding/return demonstration of topics presented.  Learning Barriers/Preferences:   General Pulmonary Education Topics:  Infection Prevention: - Provides verbal and written material to individual with discussion of infection control including proper hand washing and proper equipment cleaning during exercise session. Flowsheet Row Pulmonary Rehab from 09/18/2021 in [facility] Cardiac and Pulmonary Rehab  Date 09/18/21  Educator NT  Instruction Review Code 1- Verbalizes Understanding       Falls Prevention: - Provides verbal and written material to individual with discussion of falls prevention and safety. Flowsheet Row Pulmonary Rehab from 09/18/2021 in [facility] Cardiac and Pulmonary Rehab  Date 09/16/21  Educator SB  Instruction Review Code 1-  Verbalizes Understanding       Chronic Lung Disease Review: - Group verbal instruction with posters, models, PowerPoint presentations and videos,  to review new updates, new respiratory medications, new advancements in procedures and treatments. Providing information on websites and "800" numbers for continued self-education. Includes information about supplement oxygen, available portable oxygen systems, continuous and intermittent flow rates, oxygen safety, concentrators, and Medicare reimbursement for oxygen. Explanation of Pulmonary Drugs, including class, frequency, complications, importance of spacers, rinsing mouth after steroid MDI's, and proper cleaning methods for nebulizers. Review of basic lung anatomy and physiology related to function, structure, and complications of lung disease. Review of risk factors. Discussion about methods for diagnosing sleep apnea and types of masks and machines for OSA. Includes a review of the use of types of environmental controls: home humidity, furnaces, filters, dust mite/pet prevention, HEPA vacuums. Discussion about weather changes, air quality and the benefits of nasal washing. Instruction on Warning signs, infection symptoms, calling MD promptly, preventive modes, and value of vaccinations. Review of effective airway clearance, coughing and/or vibration techniques. Emphasizing that all should Create an Action Plan. Written material given at graduation. Flowsheet Row Pulmonary Rehab from 09/18/2021 in [facility] Cardiac and Pulmonary Rehab  Education need identified 09/18/21  AED/CPR: - Group verbal and written instruction with the use of models to demonstrate the basic use of the AED with the basic ABC's of resuscitation.    Anatomy and Cardiac Procedures: - Group verbal and visual presentation and models provide information about basic cardiac anatomy and function. Reviews the testing methods done to diagnose heart disease and the outcomes of the  test results. Describes the treatment choices: Medical Management, Angioplasty, or Coronary Bypass Surgery for treating various heart conditions including Myocardial Infarction, Angina, Valve Disease, and Cardiac Arrhythmias.  Written material given at graduation.   Medication Safety: - Group verbal and visual instruction to review commonly prescribed medications for heart and lung disease. Reviews the medication, class of the drug, and side effects. Includes the steps to properly store meds and maintain the prescription regimen.  Written material given at graduation.   Other: -Provides group and verbal instruction on various topics (see comments)   Knowledge Questionnaire Score:  Knowledge Questionnaire Score - 09/18/21 1536       Knowledge Questionnaire Score   Pre Score 14/18              Core Components/Risk Factors/Patient Goals at Admission:  Personal Goals and Risk Factors at Admission - 09/18/21 1534       Core Components/Risk Factors/Patient Goals on Admission    Weight Management Yes    Intervention Weight Management: Develop a combined nutrition and exercise program designed to reach desired caloric intake, while maintaining appropriate intake of nutrient and fiber, sodium and fats, and appropriate energy expenditure required for the weight goal.;Weight Management/Obesity: Establish reasonable short term and long term weight goals.;Obesity: Provide education and appropriate resources to help participant work on and attain dietary goals.    Admit Weight 196 lb 6.4 oz (89.1 kg)    Goal Weight: Short Term 190 lb (86.2 kg)    Goal Weight: Long Term 185 lb (83.9 kg)    Expected Outcomes Short Term: Continue to assess and modify interventions until short term weight is achieved;Long Term: Adherence to nutrition and physical activity/exercise program aimed toward attainment of established weight goal;Weight Loss: Understanding of general recommendations for a balanced deficit  meal plan, which promotes 1-2 lb weight loss per week and includes a negative energy balance of [phone] kcal/d    Improve shortness of breath with ADL's Yes    Intervention Provide education, individualized exercise plan and daily activity instruction to help decrease symptoms of SOB with activities of daily living.    Expected Outcomes Short Term: Improve cardiorespiratory fitness to achieve a reduction of symptoms when performing ADLs;Long Term: Be able to perform more ADLs without symptoms or delay the onset of symptoms    Increase knowledge of respiratory medications and ability to use respiratory devices properly  Yes    Intervention Provide education and demonstration as needed of appropriate use of medications, inhalers, and oxygen therapy.    Expected Outcomes Short Term: Achieves understanding of medications use. Understands that oxygen is a medication prescribed by physician. Demonstrates appropriate use of inhaler and oxygen therapy.;Long Term: Maintain appropriate use of medications, inhalers, and oxygen therapy.    Diabetes Yes    Intervention Provide education about signs/symptoms and action to take for hypo/hyperglycemia.;Provide education about proper nutrition, including hydration, and aerobic/resistive exercise prescription along with prescribed medications to achieve blood glucose in normal ranges: Fasting glucose 65-99 mg/dL    Expected Outcomes Short Term: Participant verbalizes understanding of the signs/symptoms and immediate care of hyper/hypoglycemia, proper foot care and  importance of medication, aerobic/resistive exercise and nutrition plan for blood glucose control.;Long Term: Attainment of HbA1C < 7%.    Hypertension Yes    Intervention Provide education on lifestyle modifcations including regular physical activity/exercise, weight management, moderate sodium restriction and increased consumption of fresh fruit, vegetables, and low fat dairy, alcohol moderation, and smoking  cessation.;Monitor prescription use compliance.    Expected Outcomes Short Term: Continued assessment and intervention until BP is < 140/18m HG in hypertensive participants. < 130/856mHG in hypertensive participants with diabetes, heart failure or chronic kidney disease.;Long Term: Maintenance of blood pressure at goal levels.    Lipids Yes    Intervention Provide education and support for participant on nutrition & aerobic/resistive exercise along with prescribed medications to achieve LDL '70mg'$ , HDL >'40mg'$ .    Expected Outcomes Short Term: Participant states understanding of desired cholesterol values and is compliant with medications prescribed. Participant is following exercise prescription and nutrition guidelines.;Long Term: Cholesterol controlled with medications as prescribed, with individualized exercise RX and with personalized nutrition plan. Value goals: LDL < '70mg'$ , HDL > 40 mg.             Education:Diabetes - Individual verbal and written instruction to review signs/symptoms of diabetes, desired ranges of glucose level fasting, after meals and with exercise. Acknowledge that pre and post exercise glucose checks will be done for 3 sessions at entry of program. [facility]  Date 09/18/21  Educator NT  Instruction Review Code 1- Verbalizes Understanding       Know Your Numbers and Heart Failure: - Group verbal and visual instruction to discuss disease risk factors for cardiac and pulmonary disease and treatment options.  Reviews associated critical values for Overweight/Obesity, Hypertension, Cholesterol, and Diabetes.  Discusses basics of heart failure: signs/symptoms and treatments.  Introduces Heart Failure Zone chart for action plan for heart failure.  Written material given at graduation.   Core Components/Risk Factors/Patient Goals Review:   Goals and Risk Factor Review     Row Name 10/28/21 1023              Core Components/Risk Factors/Patient Goals Review   Personal Goals Review Weight Management/Obesity;Hypertension;Lipids;Diabetes       Review Kayla Wu that she would like to lose some weight. She has a short term goal of losing 10 lbs and a long term goal of 25 lbs. She states that she has not been checking her BP at home but was encouraged to do so especially with exercise. Kayla Wu check her blood sugars at home and states that they are within normal ranges.       Expected Outcomes Short: begin to checl BP at home. Long: Continue to monitor lifestyle risk factors.                Core Components/Risk Factors/Patient Goals at Discharge (Final Review):   Goals and Risk Factor Review - 10/28/21 1023       Core Components/Risk Factors/Patient Goals Review   Personal Goals Review Weight Management/Obesity;Hypertension;Lipids;Diabetes    Review Kayla Wu that she would like to lose some weight. She has a short term goal of losing 10 lbs and a long term goal of 25 lbs. She states that she has not been checking her BP at home but was encouraged to do so especially with exercise. Kayla Wu check her blood sugars at home and states that they are within normal ranges.    Expected Outcomes Short:  begin to checl BP at home. Long: Continue to monitor lifestyle risk factors.             ITP Comments:  ITP Comments     Row Name 09/16/21 1507 09/18/21 1448 10/09/21 1154 10/09/21 1332 11/06/21 0734   ITP Comments Virtual orientation call completed today. shehas an appointment on Date: 09/20/2021  for EP eval and gym Orientation.  Documentation of diagnosis can be found in [facility] Date: 08/01/2021 . Completed 6MWT and gym orientation. Initial ITP created and sent for review to Dr. Ottie Glazier, Medical Director. First full day of exercise!  Patient was oriented to gym and equipment including functions, settings, policies, and procedures.  Patient's individual exercise  prescription and treatment plan were reviewed.  All starting workloads were established based on the results of the 6 minute walk test done at initial orientation visit.  The plan for exercise progression was also introduced and progression will be customized based on patient's performance and goals. 30 Day review completed. Medical Director ITP review done, changes made as directed, and signed approval by Medical Director.    NEW 30 Day review completed. Medical Director ITP review done, changes made as directed, and signed approval by Medical Director.            Comments:

## 2021-11-06 NOTE — Progress Notes (Signed)
Daily Session Note  Patient Details  Name: Kayla Wu MRN: [id number] Date of Birth: 1945/10/16 Referring Provider:   Flowsheet Row Pulmonary Rehab from 09/18/2021 in [facility] Cardiac and Pulmonary Rehab  Referring Provider Lanney Gins       Encounter Date: 11/06/2021  Check In:  Session Check In - 11/06/21 1004       Check-In   Supervising physician immediately available to respond to emergencies See telemetry face sheet for immediately available ER MD    Location [facility]-Cardiac & Pulmonary Rehab    Staff Present Antionette Fairy, BS, Exercise Physiologist;Kendi Defalco Tamala Julian, RN, Terie Purser, RCP,RRT,BSRT    Virtual Visit No    Medication changes reported     No    Fall or balance concerns reported    No    Warm-up and Cool-down Performed on first and last piece of equipment    Resistance Training Performed Yes    VAD Patient? No    PAD/SET Patient? No      Pain Assessment   Currently in Pain? No/denies                Social History   Tobacco Use  Smoking Status Former   Packs/day: 2.00   Years: 42.00   Total pack years: 84.00   Types: Cigarettes   Quit date: 07/05/2007   Years since quitting: 14.3  Smokeless Tobacco Never    Goals Met:  Independence with exercise equipment Exercise tolerated well No report of concerns or symptoms today Strength training completed today  Goals Unmet:  Not Applicable  Comments: Pt able to follow exercise prescription today without complaint.  Will continue to monitor for progression.    Dr. Emily Filbert is Medical Director for [place].  Dr. Ottie Glazier is Medical Director for [facility] Pulmonary Rehabilitation.

## 2021-11-08 ENCOUNTER — Encounter: Payer: Medicare HMO | Admitting: *Deleted

## 2021-11-08 DIAGNOSIS — R0600 Dyspnea, unspecified: Secondary | ICD-10-CM

## 2021-11-08 DIAGNOSIS — J849 Interstitial pulmonary disease, unspecified: Secondary | ICD-10-CM

## 2021-11-08 DIAGNOSIS — U099 Post covid-19 condition, unspecified: Secondary | ICD-10-CM | POA: Diagnosis not present

## 2021-11-08 NOTE — Progress Notes (Signed)
Daily Session Note  Patient Details  Name: Kayla Wu MRN: [id number] Date of Birth: 1945-07-03 Referring Provider:   Flowsheet Row Pulmonary Rehab from 09/18/2021 in [facility] Cardiac and Pulmonary Rehab  Referring Provider Lanney Gins       Encounter Date: 11/08/2021  Check In:  Session Check In - 11/08/21 1040       Check-In   Supervising physician immediately available to respond to emergencies See telemetry face sheet for immediately available ER MD    Location [facility]-Cardiac & Pulmonary Rehab    Staff Present Heath Lark, RN, BSN, CCRP;Jessica Hemlock, MA, RCEP, CCRP, CCET;Joseph Alder, [place]    Virtual Visit No    Medication changes reported     No    Fall or balance concerns reported    No    Warm-up and Cool-down Performed on first and last piece of equipment    Resistance Training Performed Yes    VAD Patient? No    PAD/SET Patient? No      Pain Assessment   Currently in Pain? No/denies                Social History   Tobacco Use  Smoking Status Former   Packs/day: 2.00   Years: 42.00   Total pack years: 84.00   Types: Cigarettes   Quit date: 07/05/2007   Years since quitting: 14.3  Smokeless Tobacco Never    Goals Met:  Proper associated with RPD/PD & O2 Sat Independence with exercise equipment Exercise tolerated well No report of concerns or symptoms today  Goals Unmet:  Not Applicable  Comments: Pt able to follow exercise prescription today without complaint.  Will continue to monitor for progression.    Dr. Emily Filbert is Medical Director for [place].  Dr. Ottie Glazier is Medical Director for [facility] Pulmonary Rehabilitation.

## 2021-11-11 ENCOUNTER — Encounter: Payer: Medicare HMO | Admitting: *Deleted

## 2021-11-11 DIAGNOSIS — J849 Interstitial pulmonary disease, unspecified: Secondary | ICD-10-CM

## 2021-11-11 DIAGNOSIS — R0600 Dyspnea, unspecified: Secondary | ICD-10-CM | POA: Diagnosis not present

## 2021-11-11 DIAGNOSIS — U099 Post covid-19 condition, unspecified: Secondary | ICD-10-CM

## 2021-11-11 NOTE — Progress Notes (Signed)
Daily Session Note  Patient Details  Name: Kayla Wu MRN: [id number] Date of Birth: 1945-06-09 Referring Provider:   Flowsheet Row Pulmonary Rehab from 09/18/2021 in [facility] Cardiac and Pulmonary Rehab  Referring Provider Lanney Gins       Encounter Date: 11/11/2021  Check In:  Session Check In - 11/11/21 0957       Check-In   Supervising physician immediately available to respond to emergencies See telemetry face sheet for immediately available ER MD    Location [facility]-Cardiac & Pulmonary Rehab    Staff Present Antionette Fairy, BS, Exercise Physiologist;Kelly Amedeo Plenty, BS, ACSM CEP, Exercise Physiologist;Chandler Swiderski Tamala Julian, RN, ADN    Virtual Visit No    Medication changes reported     No    Fall or balance concerns reported    No    Warm-up and Cool-down Performed on first and last piece of equipment    Resistance Training Performed Yes    VAD Patient? No    PAD/SET Patient? No      Pain Assessment   Currently in Pain? No/denies                Social History   Tobacco Use  Smoking Status Former   Packs/day: 2.00   Years: 42.00   Total pack years: 84.00   Types: Cigarettes   Quit date: 07/05/2007   Years since quitting: 14.3  Smokeless Tobacco Never    Goals Met:  Independence with exercise equipment Exercise tolerated well No report of concerns or symptoms today Strength training completed today  Goals Unmet:  Not Applicable  Comments: Pt able to follow exercise prescription today without complaint.  Will continue to monitor for progression.    Dr. Emily Filbert is Medical Director for [place].  Dr. Ottie Glazier is Medical Director for [facility] Pulmonary Rehabilitation.

## 2021-11-12 DIAGNOSIS — U099 Post covid-19 condition, unspecified: Secondary | ICD-10-CM

## 2021-11-12 NOTE — Progress Notes (Signed)
Completed initial RD consultation ?

## 2021-11-13 ENCOUNTER — Encounter: Payer: Medicare HMO | Admitting: *Deleted

## 2021-11-13 DIAGNOSIS — R0600 Dyspnea, unspecified: Secondary | ICD-10-CM | POA: Diagnosis not present

## 2021-11-13 DIAGNOSIS — U099 Post covid-19 condition, unspecified: Secondary | ICD-10-CM

## 2021-11-13 DIAGNOSIS — J849 Interstitial pulmonary disease, unspecified: Secondary | ICD-10-CM

## 2021-11-13 NOTE — Progress Notes (Signed)
Daily Session Note  Patient Details  Name: Kayla Wu MRN: [id number] Date of Birth: [birth year] Referring Provider:   Flowsheet Row Pulmonary Rehab from 09/18/2021 in [facility] Cardiac and Pulmonary Rehab  Referring Provider Lanney Gins       Encounter Date: 11/13/2021  Check In:  Session Check In - 11/13/21 1001       Check-In   Supervising physician immediately available to respond to emergencies See telemetry face sheet for immediately available ER MD    Location [facility]-Cardiac & Pulmonary Rehab    Staff Present Antionette Fairy, BS, Exercise Physiologist;Joseph Rosebud Poles, RN, [place]    Virtual Visit No    Medication changes reported     No    Fall or balance concerns reported    No    Warm-up and Cool-down Performed on first and last piece of equipment    Resistance Training Performed Yes    VAD Patient? No    PAD/SET Patient? No      Pain Assessment   Currently in Pain? No/denies                Social History   Tobacco Use  Smoking Status Former   Packs/day: 2.00   Years: 42.00   Total pack years: 84.00   Types: Cigarettes   Quit date: 07/05/2007   Years since quitting: 14.3  Smokeless Tobacco Never    Goals Met:  Independence with exercise equipment Exercise tolerated well No report of concerns or symptoms today Strength training completed today  Goals Unmet:  Not Applicable  Comments: Pt able to follow exercise prescription today without complaint.  Will continue to monitor for progression.    Dr. Emily Filbert is Medical Director for [place].  Dr. Ottie Glazier is Medical Director for [facility] Pulmonary Rehabilitation.

## 2021-11-15 ENCOUNTER — Encounter: Payer: Medicare HMO | Admitting: *Deleted

## 2021-11-15 DIAGNOSIS — J849 Interstitial pulmonary disease, unspecified: Secondary | ICD-10-CM

## 2021-11-15 DIAGNOSIS — U099 Post covid-19 condition, unspecified: Secondary | ICD-10-CM

## 2021-11-15 DIAGNOSIS — R0600 Dyspnea, unspecified: Secondary | ICD-10-CM

## 2021-11-15 NOTE — Progress Notes (Signed)
Daily Session Note  Patient Details  Name: Kayla Wu MRN: [id number] Date of Birth: Jun 03, 1945 Referring Provider:   Flowsheet Row Pulmonary Rehab from 09/18/2021 in [facility] Cardiac and Pulmonary Rehab  Referring Provider Lanney Gins       Encounter Date: 11/15/2021  Check In:  Session Check In - 11/15/21 0949       Check-In   Supervising physician immediately available to respond to emergencies See telemetry face sheet for immediately available ER MD    Location [facility]-Cardiac & Pulmonary Rehab    Staff Present Nyoka Cowden, RN, BSN, MA;Susanne Bice, RN, BSN, CCRP;Noah Tickle, BS, Exercise Physiologist    Virtual Visit No    Medication changes reported     No    Fall or balance concerns reported    No    Tobacco Cessation No Change    Warm-up and Cool-down Performed on first and last piece of equipment    Resistance Training Performed Yes    VAD Patient? No    PAD/SET Patient? No      Pain Assessment   Currently in Pain? No/denies                Social History   Tobacco Use  Smoking Status Former   Packs/day: 2.00   Years: 42.00   Total pack years: 84.00   Types: Cigarettes   Quit date: 07/05/2007   Years since quitting: 14.3  Smokeless Tobacco Never    Goals Met:  Independence with exercise equipment Exercise tolerated well No report of concerns or symptoms today  Goals Unmet:  Not Applicable  Comments: Pt able to follow exercise prescription today without complaint.  Will continue to monitor for progression.    Dr. Emily Filbert is Medical Director for [place].  Dr. Ottie Glazier is Medical Director for [facility] Pulmonary Rehabilitation.

## 2021-11-18 ENCOUNTER — Encounter: Payer: Medicare HMO | Admitting: *Deleted

## 2021-11-18 DIAGNOSIS — R0600 Dyspnea, unspecified: Secondary | ICD-10-CM | POA: Diagnosis not present

## 2021-11-18 DIAGNOSIS — U099 Post covid-19 condition, unspecified: Secondary | ICD-10-CM

## 2021-11-18 DIAGNOSIS — J849 Interstitial pulmonary disease, unspecified: Secondary | ICD-10-CM | POA: Diagnosis not present

## 2021-11-18 NOTE — Progress Notes (Signed)
Daily Session Note  Patient Details  Name: Kayla Wu MRN: [id number] Date of Birth: 02-11-1945 Referring Provider:   Flowsheet Row Pulmonary Rehab from 09/18/2021 in [facility] Cardiac and Pulmonary Rehab  Referring Provider Lanney Gins       Encounter Date: 11/18/2021  Check In:  Session Check In - 11/18/21 0950       Check-In   Supervising physician immediately available to respond to emergencies See telemetry face sheet for immediately available ER MD    Location [facility]-Cardiac & Pulmonary Rehab    Staff Present Antionette Fairy, BS, Exercise Physiologist;Kelly Amedeo Plenty, BS, ACSM CEP, Exercise Physiologist;Maylani Embree Tamala Julian, RN, ADN    Virtual Visit No    Medication changes reported     No    Fall or balance concerns reported    No    Warm-up and Cool-down Performed on first and last piece of equipment    Resistance Training Performed Yes    VAD Patient? No    PAD/SET Patient? No      Pain Assessment   Currently in Pain? No/denies                Social History   Tobacco Use  Smoking Status Former   Packs/day: 2.00   Years: 42.00   Total pack years: 84.00   Types: Cigarettes   Quit date: 07/05/2007   Years since quitting: 14.3  Smokeless Tobacco Never    Goals Met:  Independence with exercise equipment Exercise tolerated well No report of concerns or symptoms today Strength training completed today  Goals Unmet:  Not Applicable  Comments: Pt able to follow exercise prescription today without complaint.  Will continue to monitor for progression.    Dr. Emily Filbert is Medical Director for [place].  Dr. Ottie Glazier is Medical Director for [facility] Pulmonary Rehabilitation.

## 2021-11-20 ENCOUNTER — Encounter: Payer: Medicare HMO | Admitting: *Deleted

## 2021-11-20 DIAGNOSIS — J849 Interstitial pulmonary disease, unspecified: Secondary | ICD-10-CM | POA: Diagnosis not present

## 2021-11-20 DIAGNOSIS — U099 Post covid-19 condition, unspecified: Secondary | ICD-10-CM

## 2021-11-20 DIAGNOSIS — R0600 Dyspnea, unspecified: Secondary | ICD-10-CM

## 2021-11-20 NOTE — Progress Notes (Signed)
Daily Session Note  Patient Details  Name: Kayla Wu MRN: [id number] Date of Birth: 1945/09/22 Referring Provider:   Flowsheet Row Pulmonary Rehab from 09/18/2021 in [facility] Cardiac and Pulmonary Rehab  Referring Provider Lanney Gins       Encounter Date: 11/20/2021  Check In:  Session Check In - 11/20/21 1037       Check-In   Staff Present Antionette Fairy, BS, Exercise Physiologist;Jessica [place], MA, [place], CCRP, Mindi Curling, RN, [place]    Virtual Visit No    Medication changes reported     No    Fall or balance concerns reported    No    Warm-up and Cool-down Performed on first and last piece of equipment    Resistance Training Performed Yes    VAD Patient? No    PAD/SET Patient? No      Pain Assessment   Currently in Pain? No/denies                Social History   Tobacco Use  Smoking Status Former   Packs/day: 2.00   Years: 42.00   Total pack years: 84.00   Types: Cigarettes   Quit date: 07/05/2007   Years since quitting: 14.3  Smokeless Tobacco Never    Goals Met:  Independence with exercise equipment Exercise tolerated well No report of concerns or symptoms today Strength training completed today  Goals Unmet:  Not Applicable  Comments: Pt able to follow exercise prescription today without complaint.  Will continue to monitor for progression.    Dr. Emily Filbert is Medical Director for [place].  Dr. Ottie Glazier is Medical Director for [facility] Pulmonary Rehabilitation.

## 2021-11-22 ENCOUNTER — Encounter: Payer: Medicare HMO | Admitting: *Deleted

## 2021-11-22 DIAGNOSIS — U099 Post covid-19 condition, unspecified: Secondary | ICD-10-CM | POA: Diagnosis not present

## 2021-11-22 DIAGNOSIS — R0600 Dyspnea, unspecified: Secondary | ICD-10-CM | POA: Diagnosis not present

## 2021-11-22 DIAGNOSIS — J849 Interstitial pulmonary disease, unspecified: Secondary | ICD-10-CM | POA: Diagnosis not present

## 2021-11-22 NOTE — Progress Notes (Signed)
Daily Session Note  Patient Details  Name: Kayla Wu MRN: [id number] Date of Birth: [birth year] Referring Provider:   Flowsheet Row Pulmonary Rehab from 09/18/2021 in [facility] Cardiac and Pulmonary Rehab  Referring Provider Lanney Gins       Encounter Date: 11/22/2021  Check In:  Session Check In - 11/22/21 1030       Check-In   Supervising physician immediately available to respond to emergencies See telemetry face sheet for immediately available ER MD    Location [facility]-Cardiac & Pulmonary Rehab    Staff Present Heath Lark, RN, BSN, CCRP;Jessica [place], MA, RCEP, CCRP, CCET;Joseph [place], [place]    Virtual Visit No    Medication changes reported     No    Fall or balance concerns reported    No    Warm-up and Cool-down Performed on first and last piece of equipment    Resistance Training Performed Yes    VAD Patient? No    PAD/SET Patient? No      Pain Assessment   Currently in Pain? No/denies                Social History   Tobacco Use  Smoking Status Former   Packs/day: 2.00   Years: 42.00   Total pack years: 84.00   Types: Cigarettes   Quit date: 07/05/2007   Years since quitting: 14.3  Smokeless Tobacco Never    Goals Met:  Proper associated with RPD/PD & O2 Sat Independence with exercise equipment Exercise tolerated well No report of concerns or symptoms today  Goals Unmet:  Not Applicable  Comments: Pt able to follow exercise prescription today without complaint.  Will continue to monitor for progression.    Dr. Emily Filbert is Medical Director for [place].  Dr. Ottie Glazier is Medical Director for [facility] Pulmonary Rehabilitation.

## 2021-11-25 ENCOUNTER — Encounter: Payer: Medicare HMO | Admitting: *Deleted

## 2021-11-25 DIAGNOSIS — U099 Post covid-19 condition, unspecified: Secondary | ICD-10-CM | POA: Diagnosis not present

## 2021-11-25 DIAGNOSIS — J849 Interstitial pulmonary disease, unspecified: Secondary | ICD-10-CM | POA: Diagnosis not present

## 2021-11-25 DIAGNOSIS — R0600 Dyspnea, unspecified: Secondary | ICD-10-CM | POA: Diagnosis not present

## 2021-11-25 NOTE — Progress Notes (Signed)
Daily Session Note  Patient Details  Name: Kayla Wu MRN: [id number] Date of Birth: May 13, 1945 Referring Provider:   Flowsheet Row Pulmonary Rehab from 09/18/2021 in [facility] Cardiac and Pulmonary Rehab  Referring Provider Lanney Gins       Encounter Date: 11/25/2021  Check In:  Session Check In - 11/25/21 0957       Check-In   Supervising physician immediately available to respond to emergencies See telemetry face sheet for immediately available ER MD    Location [facility]-Cardiac & Pulmonary Rehab    Staff Present Earlean Shawl, BS, ACSM CEP, Exercise Physiologist;Noah Tickle, BS, Exercise Physiologist;Jonica Bickhart Tamala Julian, RN, ADN    Virtual Visit No    Medication changes reported     No    Fall or balance concerns reported    No    Warm-up and Cool-down Performed on first and last piece of equipment    Resistance Training Performed Yes    VAD Patient? No    PAD/SET Patient? No      Pain Assessment   Currently in Pain? No/denies                Social History   Tobacco Use  Smoking Status Former   Packs/day: 2.00   Years: 42.00   Total pack years: 84.00   Types: Cigarettes   Quit date: 07/05/2007   Years since quitting: 14.4  Smokeless Tobacco Never    Goals Met:  Independence with exercise equipment Exercise tolerated well No report of concerns or symptoms today Strength training completed today  Goals Unmet:  Not Applicable  Comments: Pt able to follow exercise prescription today without complaint.  Will continue to monitor for progression.    Dr. Emily Filbert is Medical Director for [place].  Dr. Ottie Glazier is Medical Director for [facility] Pulmonary Rehabilitation.

## 2021-11-27 ENCOUNTER — Encounter: Payer: Medicare HMO | Admitting: *Deleted

## 2021-11-27 DIAGNOSIS — R0600 Dyspnea, unspecified: Secondary | ICD-10-CM

## 2021-11-27 DIAGNOSIS — U099 Post covid-19 condition, unspecified: Secondary | ICD-10-CM | POA: Diagnosis not present

## 2021-11-27 DIAGNOSIS — J849 Interstitial pulmonary disease, unspecified: Secondary | ICD-10-CM

## 2021-11-27 NOTE — Progress Notes (Signed)
Daily Session Note  Patient Details  Name: Kayla Wu MRN: [id number] Date of Birth: 1945/12/02 Referring Provider:   Flowsheet Row Pulmonary Rehab from 09/18/2021 in [facility] Cardiac and Pulmonary Rehab  Referring Provider Lanney Gins       Encounter Date: 11/27/2021  Check In:  Session Check In - 11/27/21 1014       Check-In   Supervising physician immediately available to respond to emergencies See telemetry face sheet for immediately available ER MD    Location [facility]-Cardiac & Pulmonary Rehab    Staff Present Antionette Fairy, BS, Exercise Physiologist;Joseph Rosebud Poles, RN, [place]    Virtual Visit No    Medication changes reported     No    Fall or balance concerns reported    No    Warm-up and Cool-down Performed on first and last piece of equipment    Resistance Training Performed Yes    VAD Patient? No    PAD/SET Patient? No      Pain Assessment   Currently in Pain? No/denies                Social History   Tobacco Use  Smoking Status Former   Packs/day: 2.00   Years: 42.00   Total pack years: 84.00   Types: Cigarettes   Quit date: 07/05/2007   Years since quitting: 14.4  Smokeless Tobacco Never    Goals Met:  Independence with exercise equipment Exercise tolerated well No report of concerns or symptoms today Strength training completed today  Goals Unmet:  Not Applicable  Comments: Pt able to follow exercise prescription today without complaint.  Will continue to monitor for progression.    Dr. Emily Filbert is Medical Director for [place].  Dr. Ottie Glazier is Medical Director for [facility] Pulmonary Rehabilitation.

## 2021-11-29 ENCOUNTER — Encounter: Payer: Medicare HMO | Admitting: *Deleted

## 2021-11-29 DIAGNOSIS — U099 Post covid-19 condition, unspecified: Secondary | ICD-10-CM

## 2021-11-29 DIAGNOSIS — J849 Interstitial pulmonary disease, unspecified: Secondary | ICD-10-CM

## 2021-11-29 DIAGNOSIS — R0600 Dyspnea, unspecified: Secondary | ICD-10-CM | POA: Diagnosis not present

## 2021-11-29 NOTE — Progress Notes (Signed)
Daily Session Note  Patient Details  Name: Kayla Wu MRN: [id number] Date of Birth: 20-Sep-1945 Referring Provider:   Flowsheet Row Pulmonary Rehab from 09/18/2021 in [facility] Cardiac and Pulmonary Rehab  Referring Provider Lanney Gins       Encounter Date: 11/29/2021  Check In:  Session Check In - 11/29/21 1005       Check-In   Supervising physician immediately available to respond to emergencies See telemetry face sheet for immediately available ER MD    Location [facility]-Cardiac & Pulmonary Rehab    Staff Present Heath Lark, RN, BSN, CCRP;Jessica [place], MA, RCEP, CCRP, CCET;Joseph [place], [place]    Virtual Visit No    Medication changes reported     No    Fall or balance concerns reported    No    Warm-up and Cool-down Performed on first and last piece of equipment    Resistance Training Performed Yes    VAD Patient? No    PAD/SET Patient? No      Pain Assessment   Currently in Pain? No/denies                Social History   Tobacco Use  Smoking Status Former   Packs/day: 2.00   Years: 42.00   Total pack years: 84.00   Types: Cigarettes   Quit date: 07/05/2007   Years since quitting: 14.4  Smokeless Tobacco Never    Goals Met:  Proper associated with RPD/PD & O2 Sat Independence with exercise equipment Exercise tolerated well No report of concerns or symptoms today  Goals Unmet:  Not Applicable  Comments: Pt able to follow exercise prescription today without complaint.  Will continue to monitor for progression.    Dr. Emily Filbert is Medical Director for [place].  Dr. Ottie Glazier is Medical Director for [facility] Pulmonary Rehabilitation.

## 2021-12-02 ENCOUNTER — Encounter: Payer: Medicare HMO | Admitting: *Deleted

## 2021-12-02 DIAGNOSIS — R0600 Dyspnea, unspecified: Secondary | ICD-10-CM

## 2021-12-02 DIAGNOSIS — U099 Post covid-19 condition, unspecified: Secondary | ICD-10-CM | POA: Diagnosis not present

## 2021-12-02 DIAGNOSIS — J9611 Chronic respiratory failure with hypoxia: Secondary | ICD-10-CM | POA: Diagnosis not present

## 2021-12-02 DIAGNOSIS — J849 Interstitial pulmonary disease, unspecified: Secondary | ICD-10-CM | POA: Diagnosis not present

## 2021-12-02 NOTE — Progress Notes (Signed)
Daily Session Note  Patient Details  Name: Kayla Wu MRN: [id number] Date of Birth: 1945-03-13 Referring Provider:   Flowsheet Row Pulmonary Rehab from 09/18/2021 in [facility] Cardiac and Pulmonary Rehab  Referring Provider Lanney Gins       Encounter Date: 12/02/2021  Check In:  Session Check In - 12/02/21 0950       Check-In   Supervising physician immediately available to respond to emergencies See telemetry face sheet for immediately available ER MD    Location [facility]-Cardiac & Pulmonary Rehab    Staff Present Antionette Fairy, BS, Exercise Physiologist;Kelly Amedeo Plenty, BS, ACSM CEP, Exercise Physiologist;Shenicka Sunderlin Tamala Julian, RN, ADN    Virtual Visit No    Medication changes reported     No    Fall or balance concerns reported    No    Warm-up and Cool-down Performed on first and last piece of equipment    Resistance Training Performed Yes    VAD Patient? No    PAD/SET Patient? No      Pain Assessment   Currently in Pain? No/denies                Social History   Tobacco Use  Smoking Status Former   Packs/day: 2.00   Years: 42.00   Total pack years: 84.00   Types: Cigarettes   Quit date: 07/05/2007   Years since quitting: 14.4  Smokeless Tobacco Never    Goals Met:  Independence with exercise equipment Exercise tolerated well No report of concerns or symptoms today Strength training completed today  Goals Unmet:  Not Applicable  Comments: Pt able to follow exercise prescription today without complaint.  Will continue to monitor for progression.    Dr. Emily Filbert is Medical Director for [place].  Dr. Ottie Glazier is Medical Director for [facility] Pulmonary Rehabilitation.

## 2021-12-04 ENCOUNTER — Encounter: Payer: Medicare HMO | Attending: Pulmonary Disease | Admitting: *Deleted

## 2021-12-04 ENCOUNTER — Encounter: Payer: Self-pay | Admitting: *Deleted

## 2021-12-04 DIAGNOSIS — U099 Post covid-19 condition, unspecified: Secondary | ICD-10-CM | POA: Diagnosis not present

## 2021-12-04 DIAGNOSIS — R0600 Dyspnea, unspecified: Secondary | ICD-10-CM | POA: Diagnosis not present

## 2021-12-04 DIAGNOSIS — J849 Interstitial pulmonary disease, unspecified: Secondary | ICD-10-CM | POA: Diagnosis not present

## 2021-12-04 NOTE — Progress Notes (Signed)
Daily Session Note  Patient Details  Name: Kayla Wu MRN: [id number] Date of Birth: January 10, 1946 Referring Provider:   Flowsheet Row Pulmonary Rehab from 09/18/2021 in [facility] Cardiac and Pulmonary Rehab  Referring Provider Lanney Gins       Encounter Date: 12/04/2021  Check In:  Session Check In - 12/04/21 1004       Check-In   Supervising physician immediately available to respond to emergencies See telemetry face sheet for immediately available ER MD    Location [facility]-Cardiac & Pulmonary Rehab    Staff Present Antionette Fairy, BS, Exercise Physiologist;Joseph Rosebud Poles, RN, [place]    Virtual Visit No    Medication changes reported     No    Fall or balance concerns reported    No    Warm-up and Cool-down Performed on first and last piece of equipment    Resistance Training Performed Yes    VAD Patient? No    PAD/SET Patient? No      Pain Assessment   Currently in Pain? No/denies                Social History   Tobacco Use  Smoking Status Former   Packs/day: 2.00   Years: 42.00   Total pack years: 84.00   Types: Cigarettes   Quit date: 07/05/2007   Years since quitting: 14.4  Smokeless Tobacco Never    Goals Met:  Independence with exercise equipment Exercise tolerated well No report of concerns or symptoms today Strength training completed today  Goals Unmet:  Not Applicable  Comments: Pt able to follow exercise prescription today without complaint.  Will continue to monitor for progression.    Dr. Emily Filbert is Medical Director for [place].  Dr. Ottie Glazier is Medical Director for [facility] Pulmonary Rehabilitation.

## 2021-12-04 NOTE — Progress Notes (Signed)
Pulmonary Individual Treatment Plan  Patient Details  Name: Kayla Wu MRN: [id number] Date of Birth: 1945/05/28 Referring Provider:   Flowsheet Row Pulmonary Rehab from 09/18/2021 in [facility] Cardiac and Pulmonary Rehab  Referring Provider Lanney Gins       Initial Encounter Date:  Flowsheet Row Pulmonary Rehab from 09/18/2021 in [facility] Cardiac and Pulmonary Rehab  Date 09/18/21       Visit Diagnosis: Post covid-19 condition, unspecified  Dyspnea, unspecified type  ILD (interstitial lung disease) ([place])  Patient's Home Medications on Admission:  Current Outpatient Medications:    albuterol (VENTOLIN HFA) 108 (90 Base) MCG/ACT inhaler, Inhale into the lungs., Disp: , Rfl:    aspirin EC 81 MG tablet, Take by mouth., Disp: , Rfl:    atorvastatin (LIPITOR) 20 MG tablet, Take 1 tablet by mouth daily., Disp: , Rfl:    bisoprolol (ZEBETA) 5 MG tablet, Take 1 tablet (5 mg total) by mouth daily., Disp: 90 tablet, Rfl: 3   diltiazem (CARDIZEM CD) 180 MG 24 hr capsule, Take 1 capsule (180 mg total) by mouth daily., Disp: 90 capsule, Rfl: 0   diphenhydrAMINE (BENADRYL) 50 MG capsule, Take 1 capsule 1 hour prior to procedure, Disp: 1 capsule, Rfl: 0   ferrous sulfate 325 (65 FE) MG tablet, Take 325 mg by mouth daily with breakfast., Disp: , Rfl:    Fluticasone-Umeclidin-Vilant (TRELEGY ELLIPTA) 100-62.5-25 MCG/ACT AEPB, Inhale into the lungs., Disp: , Rfl:    gabapentin (NEURONTIN) 600 MG tablet, Take 600 mg by mouth at bedtime., Disp: , Rfl:    hydrOXYzine (ATARAX) 50 MG tablet, , Disp: , Rfl:    insulin NPH-regular Human (70-30) 100 UNIT/ML injection, Inject into the skin as needed. (Patient not taking: Reported on 09/03/2021), Disp: , Rfl:    LORazepam (ATIVAN) 1 MG tablet, Take 1 mg by mouth 2 (two) times daily as needed for anxiety or sleep., Disp: , Rfl:    losartan (COZAAR) 100 MG tablet, Take 100 mg by mouth daily., Disp: , Rfl:    magnesium oxide (MAG-OX) 400 MG tablet, Take 400 mg by  mouth 2 (two) times daily., Disp: , Rfl:    metFORMIN (GLUCOPHAGE-XR) 500 MG 24 hr tablet, Take 1,500 mg by mouth daily with supper., Disp: , Rfl:    metoprolol tartrate (LOPRESSOR) 25 MG tablet, Take 1 tablet (25 mg total) by mouth once for 1 dose. Take 2 hours prior to your CT., Disp: 1 tablet, Rfl: 0   omeprazole (PRILOSEC) 40 MG capsule, Take 1 capsule by mouth daily., Disp: , Rfl:    predniSONE (DELTASONE) 50 MG tablet, Take 50 mg 13 hours, 7 hour, and 1 hour prior to procedure, Disp: 3 tablet, Rfl: 0   TRULICITY 1.5 VQ/0.0QQ SOPN, Inject 1.5 mg into the skin once a week., Disp: , Rfl:    venlafaxine XR (EFFEXOR-XR) 150 MG 24 hr capsule, Take 150 mg by mouth daily. (take with 37.64m capsule to equal 187.539mtotal), Disp: , Rfl:    venlafaxine XR (EFFEXOR-XR) 37.5 MG 24 hr capsule, Take 37.5 mg by mouth daily. (take with 15064mapsule to equal 187.5mg58mtal), Disp: , Rfl:    vitamin B-12 (CYANOCOBALAMIN) 1000 MCG tablet, Take 1,000 mcg by mouth daily., Disp: , Rfl:   Past Medical History: Past Medical History:  Diagnosis Date   Atrial fibrillation with RVR (HCC)[place]/27/2022   Chronic urticaria    COPD (chronic obstructive pulmonary disease) (HCC)[place] COVID-19 virus infection 06/2020   DDD (degenerative disc disease)  Diabetes mellitus ([place])    Diastolic dysfunction    a. 01/2018 Echo: Nl EF. Triv AI/MR/PR. Mild TR; b. 06/2020 Echo: EF 60-65%, no rwma, gr1 DD. Nl RV size/fxn. RVSP 14.23mHg.   GERD (gastroesophageal reflux disease)    Hepatic steatosis    Hypertension    Menopausal state    Morbid obesity ([place]    Non-cardiac chest pain    a. 07/2014 MV: EF 75%, no ischemia/artifact.   Ovarian cancer ([place] 2012   s/p chemo    PAF [place]oxysmal [place] fibrillation) [place]    a. Dx 06/2020 in setting of COVID infection; b. CHA2DS2VASc = 5-->eliquis.   Personal history of chemotherapy 2012   ovarian   Pneumonia 1987   Pulmonary embolus ([place]    Rectovaginal fistula     Tobacco  Use: Social History   Tobacco Use  Smoking Status Former   Packs/day: 2.00   Years: 42.00   Total pack years: 84.00   Types: Cigarettes   Quit date: 07/05/2007   Years since quitting: 14.4  Smokeless Tobacco Never    Labs: Review Flowsheet       Latest Ref Rng & Units 01/19/2020 06/25/2020 06/26/2020  Labs for ITP Cardiac and Pulmonary Rehab  Trlycerides <150 mg/dL - 110  -  Hemoglobin A1c 4.8 - 5.6 % 6.8  - 7.4      Pulmonary Assessment Scores:  Pulmonary Assessment Scores     Row Name 09/18/21 1537         ADL UCSD   ADL Phase Entry     SOB Score total 76     Rest 1     Walk 2     Stairs 5     Bath 2     Dress 1     Shop 5       CAT Score   CAT Score 21       mMRC Score   mMRC Score 2              UCSD: Self-administered rating of dyspnea associated with activities of daily living (ADLs) 6-point scale (0 = "not at all" to 5 = "maximal or unable to do because of breathlessness")  Scoring Scores range from 0 to 120.  Minimally important difference is 5 units  CAT: CAT can identify the health impairment of COPD patients and is better correlated with disease progression.  CAT has a scoring range of zero to 40. The CAT score is classified into four groups of low (less than 10), medium (10 - 20), high (21-30) and very high (31-40) based on the impact level of disease on health status. A CAT score over 10 suggests significant symptoms.  A worsening CAT score could be explained by an exacerbation, poor medication adherence, poor inhaler technique, or progression of COPD or comorbid conditions.  CAT MCID is 2 points  mMRC: mMRC (Modified Medical Research Council) Dyspnea Scale is used to assess the degree of baseline functional disability in patients of respiratory disease due to dyspnea. No minimal important difference is established. A decrease in score of 1 point or greater is considered a positive change.   Pulmonary Function Assessment:   Exercise  Target Goals: Exercise Program Goal: Individual exercise prescription set using results from initial 6 min walk test and THRR while considering  patient's activity barriers and safety.   Exercise Prescription Goal: Initial exercise prescription builds to 30-45 minutes a day of aerobic activity, 2-3 days per week.  Home exercise guidelines  will be given to patient during program as part of exercise prescription that the participant will acknowledge.  Education: Aerobic Exercise: - Group verbal and visual presentation on the components of exercise prescription. Introduces F.I.T.T principle from ACSM for exercise prescriptions.  Reviews F.I.T.T. principles of aerobic exercise including progression. Written material given at graduation. Flowsheet Row Pulmonary Rehab from 11/27/2021 in [facility] Cardiac and Pulmonary Rehab  Education need identified 09/18/21       Education: Resistance Exercise: - Group verbal and visual presentation on the components of exercise prescription. Introduces F.I.T.T principle from ACSM for exercise prescriptions  Reviews F.I.T.T. principles of resistance exercise including progression. Written material given at graduation.    Education: Exercise & Equipment Safety: - Individual verbal instruction and demonstration of equipment use and safety with use of the equipment. Flowsheet Row Pulmonary Rehab from 11/27/2021 in [facility] Cardiac and Pulmonary Rehab  Date 09/18/21  Educator NT  Instruction Review Code 1- Verbalizes Understanding       Education: Exercise Physiology & General Exercise Guidelines: - Group verbal and written instruction with models to review the exercise physiology of the cardiovascular system and associated critical values. Provides general exercise guidelines with specific guidelines to those with heart or lung disease.  Flowsheet Row Pulmonary Rehab from 11/27/2021 in [facility] Cardiac and Pulmonary Rehab  Education need identified 09/18/21        Education: Flexibility, Balance, Mind/Body Relaxation: - Group verbal and visual presentation with interactive activity on the components of exercise prescription. Introduces F.I.T.T principle from ACSM for exercise prescriptions. Reviews F.I.T.T. principles of flexibility and balance exercise training including progression. Also discusses the mind body connection.  Reviews various relaxation techniques to help reduce and manage stress (i.e. Deep breathing, progressive muscle relaxation, and visualization). Balance handout provided to take home. Written material given at graduation.   Activity Barriers & Risk Stratification:  Activity Barriers & Cardiac Risk Stratification - 09/18/21 1516       Activity Barriers & Cardiac Risk Stratification   Activity Barriers Shortness of Breath             6 Minute Walk:  6 Minute Walk     Row Name 09/18/21 1510         6 Minute Walk   Phase Initial     Distance 950 feet     Walk Time 5.53 minutes     # of Rest Breaks 2     MPH 1.95     METS 1.55     RPE 13     Perceived Dyspnea  3     VO2 Peak 5.44     Symptoms Yes (comment)     Comments SOB     Resting HR 59 bpm     Resting BP 118/62     Resting Oxygen Saturation  94 %     Exercise Oxygen Saturation  during 6 min walk 88 %     Max Ex. HR 86 bpm     Max Ex. BP 144/72     2 Minute Post BP 132/68       Interval HR   1 Minute HR 84     2 Minute HR 86     3 Minute HR 86     4 Minute HR 85     5 Minute HR 86     6 Minute HR 86     2 Minute Post HR 64     Interval Heart Rate? Yes  Interval Oxygen   Interval Oxygen? Yes     Baseline Oxygen Saturation % 94 %     1 Minute Oxygen Saturation % 91 %     1 Minute Liters of Oxygen 0 L  RA     2 Minute Oxygen Saturation % 88 %     2 Minute Liters of Oxygen 0 L  RA     3 Minute Oxygen Saturation % 90 %     3 Minute Liters of Oxygen 0 L  RA     4 Minute Oxygen Saturation % 92 %     4 Minute Liters of Oxygen 0 L  RA     5  Minute Oxygen Saturation % 91 %     5 Minute Liters of Oxygen 0 L  RA     6 Minute Oxygen Saturation % 90 %     6 Minute Liters of Oxygen 0 L  RA     2 Minute Post Oxygen Saturation % 93 %     2 Minute Post Liters of Oxygen 0 L  RA             Oxygen Initial Assessment:  Oxygen Initial Assessment - 09/16/21 1422       Home Oxygen   Home Oxygen Device None    Sleep Oxygen Prescription None    Home Exercise Oxygen Prescription None    Home Resting Oxygen Prescription None      Initial 6 min Walk   Oxygen Used None      Program Oxygen Prescription   Program Oxygen Prescription None      Intervention   Short Term Goals To learn and demonstrate proper pursed lip breathing techniques or other breathing techniques. ;To learn and demonstrate proper use of respiratory medications;To learn and understand importance of maintaining oxygen saturations>88%    Long  Term Goals Verbalizes importance of monitoring SPO2 with pulse oximeter and return demonstration;Maintenance of O2 saturations>88%;Exhibits proper breathing techniques, such as pursed lip breathing or other method taught during program session;Demonstrates proper use of MDI's;Compliance with respiratory medication             Oxygen Re-Evaluation:  Oxygen Re-Evaluation     Row Name 10/09/21 1154 10/28/21 1027 11/20/21 1017         Program Oxygen Prescription   Program Oxygen Prescription None None None       Home Oxygen   Home Oxygen Device None None None     Sleep Oxygen Prescription None None None     Home Exercise Oxygen Prescription None None None     Home Resting Oxygen Prescription None None None       Goals/Expected Outcomes   Short Term Goals To learn and demonstrate proper pursed lip breathing techniques or other breathing techniques.  To learn and demonstrate proper pursed lip breathing techniques or other breathing techniques.  To learn and demonstrate proper pursed lip breathing techniques or other  breathing techniques.      Long  Term Goals Exhibits proper breathing techniques, such as pursed lip breathing or other method taught during program session Exhibits proper breathing techniques, such as pursed lip breathing or other method taught during program session Exhibits proper breathing techniques, such as pursed lip breathing or other method taught during program session     Comments Reviewed PLB technique with pt.  Talked about how it works and it's importance in maintaining their exercise saturations. We reviewed PLB technique and she was encouraged to  practice. She states that she used to be on oxygen but her saturations were staying around 94% so she was able to come off. She does have a pulse-ox at home and states that her oxygen saturations have stayed up at home. We reviewed PLB technique and she was encouraged to practice. She states that she does forget about PLB at times but has been working on it. She states that she used to be on oxygen but her saturations were staying around 94% so she was able to come off. She does have a pulse-ox at home and states that her oxygen saturations have stayed up at home.     Goals/Expected Outcomes Short: Become more profiecient at using PLB.   Long: Become independent at using PLB. Short: Become more profiecient at using PLB.   Long: Become independent at using PLB. Short: Become more profiecient at using PLB.   Long: Become independent at using PLB.              Oxygen Discharge (Final Oxygen Re-Evaluation):  Oxygen Re-Evaluation - 11/20/21 1017       Program Oxygen Prescription   Program Oxygen Prescription None      Home Oxygen   Home Oxygen Device None    Sleep Oxygen Prescription None    Home Exercise Oxygen Prescription None    Home Resting Oxygen Prescription None      Goals/Expected Outcomes   Short Term Goals To learn and demonstrate proper pursed lip breathing techniques or other breathing techniques.     Long  Term Goals  Exhibits proper breathing techniques, such as pursed lip breathing or other method taught during program session    Comments We reviewed PLB technique and she was encouraged to practice. She states that she does forget about PLB at times but has been working on it. She states that she used to be on oxygen but her saturations were staying around 94% so she was able to come off. She does have a pulse-ox at home and states that her oxygen saturations have stayed up at home.    Goals/Expected Outcomes Short: Become more profiecient at using PLB.   Long: Become independent at using PLB.             Initial Exercise Prescription:  Initial Exercise Prescription - 09/18/21 1500       Date of Initial Exercise RX and Referring Provider   Date 09/18/21    Referring Provider Aleskerov      Oxygen   Maintain Oxygen Saturation 88% or higher      Treadmill   MPH 1.5    Grade 0    Minutes 15    METs 2.15      NuStep   Level 1    SPM 80    Minutes 15    METs 1.55      T5 Nustep   Level 1    SPM 80    Minutes 15    METs 1.55      Biostep-RELP   Level 1    SPM 60    Minutes 15    METs 1.55      Track   Laps 15    Minutes 15    METs 1.82      Prescription Details   Frequency (times per week) 3    Duration Progress to 30 minutes of continuous aerobic without signs/symptoms of physical distress      Intensity   THRR 40-80% of Max Heartrate  93-127    Ratings of Perceived Exertion 11-15    Perceived Dyspnea 0-4      Progression   Progression Continue to progress workloads to maintain intensity without signs/symptoms of physical distress.      Resistance Training   Training Prescription Yes    Weight 3 lb    Reps 10-15             Perform Capillary Blood Glucose checks as needed.  Exercise Prescription Changes:   Exercise Prescription Changes     Row Name 09/18/21 1500 10/22/21 0900 11/04/21 1300 11/18/21 1500 12/02/21 1300     Response to Exercise   Blood  Pressure (Admit) 118/62 110/62 122/70 132/74 138/72   Blood Pressure (Exercise) 144/72 124/74 152/84 -- --   Blood Pressure (Exit) 120/64 110/68 122/64 118/60 122/60   Heart Rate (Admit) 59 bpm 59 bpm 68 bpm 65 bpm 62 bpm   Heart Rate (Exercise) 86 bpm 83 bpm 86 bpm 86 bpm 86 bpm   Heart Rate (Exit) 63 bpm 71 bpm 63 bpm 72 bpm 65 bpm   Oxygen Saturation (Admit) 94 % 92 % 92 % 93 % 93 %   Oxygen Saturation (Exercise) 88 % 92 % 88 % 88 % 88 %   Oxygen Saturation (Exit) 91 % 92 % 90 % 90 % 97 %   Rating of Perceived Exertion (Exercise) _0 Perceived Dyspnea (Exercise) _1 Symptoms _2    Comments Completed 6MWT -- -- -- --   Duration Progress to 30 minutes of  aerobic without signs/symptoms of physical distress Progress to 30 minutes of  aerobic without signs/symptoms of physical distress Progress to 30 minutes of  aerobic without signs/symptoms of physical distress Continue with 30 min of aerobic exercise without signs/symptoms of physical distress. Continue with 30 min of aerobic exercise without signs/symptoms of physical distress.   Intensity _3      Progression   Progression -- Continue to progress workloads to maintain intensity without signs/symptoms of physical distress. Continue to progress workloads to maintain intensity without signs/symptoms of physical distress. Continue to progress workloads to maintain intensity without signs/symptoms of physical distress. Continue to progress workloads to maintain intensity without signs/symptoms of physical distress.   Average METs -- 2.46 2.34 2.61 2.97     Resistance Training   Training Prescription -- Yes Yes Yes Yes   Weight -- 3 lb 3 lb 3 lb 3 lb   Reps -- 10-15 10-15 10-15 10-15     Interval Training   Interval Training -- No No No No     Treadmill   MPH -- 1.5 1.8 -- --   Grade -- 0 0 -- --   Minutes -- 15 15 -- --   METs --  2.15 2.38 -- --     Recumbant Bike   Level -- -- -- 2 2   RPM -- -- -- 50 --   Watts -- -- -- 18 19   Minutes -- -- -- 15 15   METs -- -- -- 2.63 3.1     NuStep   Level -- 2 -- 3 5   Minutes -- 15 -- 15 15   METs -- 2.6 -- 2.6 3.5     T5 Nustep   Level -- 3 -- 3 2   Minutes -- 15 -- 15 15   METs -- -- --  2 1.9     Biostep-RELP   Level -- -- 2 2 --   Minutes -- -- 15 15 --   METs -- -- 3 2 --     Track   Laps -- _0 Minutes -- _1 METs -- 2.63 2.63 2.63 2.63     Oxygen   Maintain Oxygen Saturation -- 88% or higher 88% or higher 88% or higher 88% or higher            Exercise Comments:   Exercise Comments     Row Name 10/09/21 1154           Exercise Comments First full day of exercise!  Patient was oriented to gym and equipment including functions, settings, policies, and procedures.  Patient's individual exercise prescription and treatment plan were reviewed.  All starting workloads were established based on the results of the 6 minute walk test done at initial orientation visit.  The plan for exercise progression was also introduced and progression will be customized based on patient's performance and goals.                Exercise Goals and Review:   Exercise Goals     Row Name 09/18/21 1528             Exercise Goals   Increase Physical Activity Yes       Intervention Provide advice, education, support and counseling about physical activity/exercise needs.;Develop an individualized exercise prescription for aerobic and resistive training based on initial evaluation findings, risk stratification, comorbidities and participant's personal goals.       Expected Outcomes Short Term: Attend rehab on a regular basis to increase amount of physical activity.;Long Term: Add in home exercise to make exercise part of routine and to increase amount of physical activity.;Long Term: Exercising regularly at least 3-5 days a week.        Increase Strength and Stamina Yes       Intervention Provide advice, education, support and counseling about physical activity/exercise needs.;Develop an individualized exercise prescription for aerobic and resistive training based on initial evaluation findings, risk stratification, comorbidities and participant's personal goals.       Expected Outcomes Short Term: Increase workloads from initial exercise prescription for resistance, speed, and METs.;Long Term: Improve cardiorespiratory fitness, muscular endurance and strength as measured by increased METs and functional capacity (6MWT);Short Term: Perform resistance training exercises routinely during rehab and add in resistance training at home       Able to understand and use rate of perceived exertion (RPE) scale Yes       Intervention Provide education and explanation on how to use RPE scale       Expected Outcomes Short Term: Able to use RPE daily in rehab to express subjective intensity level;Long Term:  Able to use RPE to guide intensity level when exercising independently       Able to understand and use Dyspnea scale Yes       Intervention Provide education and explanation on how to use Dyspnea scale       Expected Outcomes Short Term: Able to use Dyspnea scale daily in rehab to express subjective sense of shortness of breath during exertion;Long Term: Able to use Dyspnea scale to guide intensity level when exercising independently       Knowledge and understanding of Target Heart Rate Range (THRR) Yes       Intervention Provide education and explanation  of THRR including how the numbers were predicted and where they are located for reference       Expected Outcomes Short Term: Able to state/look up THRR;Long Term: Able to use THRR to govern intensity when exercising independently;Short Term: Able to use daily as guideline for intensity in rehab       Able to check pulse independently Yes       Intervention Review the importance of being able  to check your own pulse for safety during independent exercise;Provide education and demonstration on how to check pulse in carotid and radial arteries.       Expected Outcomes Short Term: Able to explain why pulse checking is important during independent exercise;Long Term: Able to check pulse independently and accurately       Understanding of Exercise Prescription Yes       Intervention Provide education, explanation, and written materials on patient's individual exercise prescription       Expected Outcomes Short Term: Able to explain program exercise prescription;Long Term: Able to explain home exercise prescription to exercise independently                Exercise Goals Re-Evaluation :  Exercise Goals Re-Evaluation     Row Name 10/09/21 1155 10/22/21 0906 10/28/21 1012 11/04/21 1324 11/18/21 1550     Exercise Goal Re-Evaluation   Exercise Goals Review Able to understand and use rate of perceived exertion (RPE) scale;Knowledge and understanding of Target Heart Rate Range (THRR);Able to understand and use Dyspnea scale;Understanding of Exercise Prescription Increase Physical Activity;Increase Strength and Stamina;Understanding of Exercise Prescription Increase Physical Activity;Increase Strength and Stamina;Understanding of Exercise Prescription Increase Physical Activity;Increase Strength and Stamina;Understanding of Exercise Prescription Increase Physical Activity;Increase Strength and Stamina;Understanding of Exercise Prescription   Comments Reviewed RPE and dyspnea scales, THR and program prescription with pt today.  Pt voiced understanding and was given a copy of goals to take home. Ahnesty is doing well in rehab.  She is on level 3 on the T5 NuStep and walking 30 laps. We will continue to monitor her progress. Shantil is doing well in rehab. She feels that she is progressing well in the program. She states that she has definitely seen some improvement especially with her endurance. She  has progressed to be able to do the treadmill and not just the track. She has been walking at home on her days away from rehab. She states that she usually walks about 20 min at home. We will continue to monitor her progress. Kayla Wu continues to do well in rehab. She has gotten up to 30 laps walking on the track. She has also increased her workload on the treadmill to a speed of 1.8 mph with no incline. Kayla Wu would benefit from adding in some incline on the treadmill as tolerated. We will continue to monitor her progress. Kayla Wu continues to do well in rehab. She has increased to level 3 on the T4 Nustep. She has been consistently reaching 30 laps on the track and should be encouraged to increase that next sessions.  She is not quite hitting her THR but RPEs are appropriate. We will continue to monitor.   Expected Outcomes Short: Use RPE daily to regulate intensity. Long: Follow program prescription in THR. Short: Increase seated equipment workloads Long: conitnue to improve stamina Short: Continue to walk on days away from rehab. Long: Continue to increase strength and stamina. Short: Add incline on the treadmill. Long: Continue to increase strength and stamina. Short: Increase laps on  track Long: Continue to increase overall MET level    Row Name 11/20/21 1004 12/02/21 1326           Exercise Goal Re-Evaluation   Exercise Goals Review Increase Physical Activity;Increase Strength and Stamina;Understanding of Exercise Prescription Increase Physical Activity;Increase Strength and Stamina;Understanding of Exercise Prescription      Comments Kayla Wu continues to do well in rehab. She states that she is able to walk better, breathe better, and perform ADLs better since starting the program. She has dealt with some hip pain that has bothered her during exercise at times. Kayla Wu has been walking at home on her days away from rehab for around 15 minutes. She states that walking has been the most difficult  form of exercise for her. We will continue to monitor her progress. Kayla Wu is doing well in rehab. She recently improved her overall average MET level to 2.97 METs. She also improved to level 5 on the T4 machine. She also has consistently gotten 30 laps on the track as well. We will continue to monitor her progress in the program.      Expected Outcomes Short: Continue to walk at home on days away from rehab. Long: Continue to increase strength and stamina. Short: Continue to increase workloads, and push for more laps on the track. Long: Continue to increase strength and stamina.               Discharge Exercise Prescription (Final Exercise Prescription Changes):  Exercise Prescription Changes - 12/02/21 1300       Response to Exercise   Blood Pressure (Admit) 138/72    Blood Pressure (Exit) 122/60    Heart Rate (Admit) 62 bpm    Heart Rate (Exercise) 86 bpm    Heart Rate (Exit) 65 bpm    Oxygen Saturation (Admit) 93 %    Oxygen Saturation (Exercise) 88 %    Oxygen Saturation (Exit) 97 %    Rating of Perceived Exertion (Exercise) 15    Perceived Dyspnea (Exercise) 3    Symptoms SOB    Duration Continue with 30 min of aerobic exercise without signs/symptoms of physical distress.    Intensity THRR unchanged      Progression   Progression Continue to progress workloads to maintain intensity without signs/symptoms of physical distress.    Average METs 2.97      Resistance Training   Training Prescription Yes    Weight 3 lb    Reps 10-15      Interval Training   Interval Training No      Recumbant Bike   Level 2    Watts 19    Minutes 15    METs 3.1      NuStep   Level 5    Minutes 15    METs 3.5      T5 Nustep   Level 2    Minutes 15    METs 1.9      Track   Laps 30    Minutes 15    METs 2.63      Oxygen   Maintain Oxygen Saturation 88% or higher             Nutrition:  Target Goals: Understanding of nutrition guidelines, daily intake of sodium  <1546m, cholesterol <2064m calories 30% from fat and 7% or less from saturated fats, daily to have 5 or more servings of fruits and vegetables.  Education: All About Nutrition: -Group instruction provided by verbal, written material, interactive activities,  discussions, models, and posters to present general guidelines for heart healthy nutrition including fat, fiber, MyPlate, the role of sodium in heart healthy nutrition, utilization of the nutrition label, and utilization of this knowledge for meal planning. Follow up email sent as well. Written material given at graduation.   Biometrics:  Pre Biometrics - 09/18/21 1528       Pre Biometrics   Height 5' 4.5" (1.638 m)    Weight 196 lb 6.4 oz (89.1 kg)    Waist Circumference 41.5 inches    Hip Circumference 45.5 inches    Waist to Hip Ratio 0.91 %    BMI (Calculated) 33.2    Single Leg Stand 2.71 seconds              Nutrition Therapy Plan and Nutrition Goals:  Nutrition Therapy & Goals - 11/12/21 1020       Nutrition Therapy   Diet Heart healthy, low Na, T2DM MNT    Drug/Food Interactions Statins/Certain Fruits    Protein (specify units) 85-90g    Fiber 23 grams    Whole Grain Foods 3 servings    Saturated Fats 13 max. grams    Fruits and Vegetables 8 servings/day    Sodium 2 grams      Personal Nutrition Goals   Nutrition Goal ST: add in heart healthy snacks, add fiber, healthy fat, and protein to smaller portions of dessert foods LT: limit saturated fat <13g/day, limit added sugar <24g/day, maintain BG <7, eat at least 23g of fiber per day, limit salt <2g/day    Comments 76 y.o. 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 PMHx includes HTN, COPD, GERD, T2DM, HLD, Ovarian cancer (diagnosis 2012) with recurrences in 2017 and 2021, DDD, chronic anemia. PSHx includes cholecystecomy, bowel obstruction. Relevant medications includes lipitor, ferrous sulfate, lorazepam, magnesium, omeprazole, B-12,  humulin, metformin, trulicity.  Last A1C was 6.1. B: scrambled eggs with toast and coffee L: PB sandwich D: grill or pressure cooker: chicken, steak, or pork chops with potato, red meat 1x/week. Soups and beans like pinto beans a couple times per month. They go out to eat 1-2x/week. They use a small amount of oil (canola), whole grain bread, and she will use salt (she is not sure how much). Drinks: water with 1 glass of sweet tea for dinner. Kayla Wu reports her blood sugar will go up and down; lows of 50-65 and highers of 150. She will have sweets once per day like cake, cookies, ice cream. Discussed general heart healthy eating as well as T2DM MNT. Suggested lowering her portion sizes of sweet desserts and adding extra protien, healthy fat, and fiber; greek yogurt and berries with cake, peanutbutter with cookie, and nuts/seeds like walnuts to ice cream as this may help with her BG control without removing these items completely. Also recommended adding in heart healthy snacks with fiber, healthy fat, and protein to her day - fruit with nuts/seeds, soup with beans and some crackers, 1/2 sandwich, bean salad for example as this can control her BG to prevent BG lows as well as sneak in some extra nutritionally dense foods.      Intervention Plan   Intervention Prescribe, educate and counsel regarding individualized specific dietary modifications aiming towards targeted core components such as weight, hypertension, lipid management, diabetes, heart failure and other comorbidities.    Expected Outcomes Short Term Goal: Understand basic principles of dietary content, such as calories, fat, sodium, cholesterol and nutrients.;Short Term Goal: A plan has  been developed with personal nutrition goals set during dietitian appointment.;Long Term Goal: Adherence to prescribed nutrition plan.             Nutrition Assessments:  MEDIFICTS Score Key: ?70 Need to make dietary changes  40-70 Heart Healthy Diet ? 40  Therapeutic Level Cholesterol Diet  Flowsheet Row Pulmonary Rehab from 09/18/2021 in [facility] Cardiac and Pulmonary Rehab  Picture Your Plate Total Score on Admission 48      Picture Your Plate Scores: <60 Unhealthy dietary pattern with much room for improvement. 41-50 Dietary pattern unlikely to meet recommendations for good health and room for improvement. 51-60 More healthful dietary pattern, with some room for improvement.  >60 Healthy dietary pattern, although there may be some specific behaviors that could be improved.   Nutrition Goals Re-Evaluation:  Nutrition Goals Re-Evaluation     [place] Name 10/28/21 1022 11/20/21 [place] has deferred to speak with the RD at this time. [place] spoke with the RD on the phone. They talked about adding more fruits and vegetables on her plate. They also discussed the importance of healthy snacks between meals to help manage her glucose levels. She is working on implementing these habits.      Expected Outcome -- Short: add more fruits and vegetables to diet. Long: Continue to practice her healthy eating patterns.               Nutrition Goals Discharge (Final Nutrition Goals Re-Evaluation):  Nutrition Goals Re-Evaluation - 11/20/21 [place] spoke with the RD on the phone. They talked about adding more fruits and vegetables on her plate. They also discussed the importance of healthy snacks between meals to help manage her glucose levels. She is working on implementing these habits.    Expected Outcome Short: add more fruits and vegetables to diet. Long: Continue to practice her healthy eating patterns.             Psychosocial: Target Goals: Acknowledge presence or absence of significant depression and/or stress, maximize coping skills, provide positive support system. Participant is able to verbalize types and ability to use techniques and skills needed for reducing stress and  depression.   Education: Stress, Anxiety, and Depression - Group verbal and visual presentation to define topics covered.  Reviews how body is impacted by stress, anxiety, and depression.  Also discusses healthy ways to reduce stress and to treat/manage anxiety and depression.  Written material given at graduation. Flowsheet Row Pulmonary Rehab from 11/27/2021 in [facility] Cardiac and Pulmonary Rehab  Date 11/27/21  Educator [facility]  Instruction Review Code 1- [employer] Understanding       Education: Sleep Hygiene -Provides group verbal and written instruction about how sleep can affect your health.  Define sleep hygiene, discuss sleep cycles and impact of sleep habits. Review good sleep hygiene tips.    Initial Review & Psychosocial Screening:  Initial Psych Review & Screening - 09/16/21 1423       Initial Review   Current issues with Current Depression;Current Anxiety/Panic;Current Psychotropic Meds      Family Dynamics   Good Support System? Yes   Kayla Wu, husband     Barriers   Psychosocial barriers to participate in program There are no identifiable barriers or psychosocial needs.      Screening Interventions   Interventions Encouraged to exercise;To provide support and resources with identified psychosocial  needs;Provide feedback about the scores to participant    Expected Outcomes Short Term goal: Utilizing psychosocial counselor, staff and physician to assist with identification of specific Stressors or current issues interfering with healing process. Setting desired goal for each stressor or current issue identified.;Long Term Goal: Stressors or current issues are controlled or eliminated.;Short Term goal: Identification and review with participant of any Quality of Life or Depression concerns found by scoring the questionnaire.;Long Term goal: The participant improves quality of Life and PHQ9 Scores as seen by post scores and/or verbalization of changes             Quality of  Life Scores:  Scores of 19 and below usually indicate a poorer quality of life in these areas.  A difference of  2-3 points is a clinically meaningful difference.  A difference of 2-3 points in the total score of the Quality of Life Index has been associated with significant improvement in overall quality of life, self-image, physical symptoms, and general health in studies assessing change in quality of life.  PHQ-9: Review Flowsheet       11/04/2021 10/11/2021 09/18/2021  Depression screen PHQ 2/9  Decreased Interest _0 Down, Depressed, Hopeless 1 0 2  PHQ - 2 Score _1 Altered sleeping 1 0 2  Tired, decreased energy _2 Change in appetite 1 0 3  Feeling bad or failure about yourself  1 1 0  Trouble concentrating _3 Moving slowly or fidgety/restless 1 0 1  Suicidal thoughts 0 0 2  PHQ-9 Score _4 Difficult doing work/chores Not difficult at all Not difficult at all Somewhat difficult   Interpretation of Total Score  Total Score Depression Severity:  1-4 = Minimal depression, 5-9 = Mild depression, 10-14 = Moderate depression, 15-19 = Moderately severe depression, 20-27 = Severe depression   Psychosocial Evaluation and Intervention:  Psychosocial Evaluation - 09/16/21 1427       Psychosocial Evaluation & Interventions   Interventions Encouraged to exercise with the program and follow exercise prescription    Comments Kayla Wu has no barriers to attending the program. She lives with her husband Kayla Wu is her support. She wants to gain more stamina and decrease her shortness of breath. She is on effoxor fro some depression. She is managing well with her meds. She is ready to get started.    Expected Outcomes STG Niki is able to attend all scheduled sessions with progess in her exerciseprescription and improvement with her shortness of breath symptoms. LTG [employer] with progression of her exercise after discharge    Continue Psychosocial Services  Follow up  required by staff             Psychosocial Re-Evaluation:  Psychosocial Re-Evaluation     [place] Name 10/11/21 1025 11/20/21 1014           Psychosocial Re-Evaluation   Current issues with Current Depression;Current Anxiety/Panic Current Depression;Current Anxiety/Panic      Expected Outcomes Reviewed patient health questionnaire (PHQ-9) with patient for follow up. Previously, patients score indicated signs/symptoms of depression.  Reviewed to see if patient is improving symptom wise while in program.  Score improved and patient states that it is because they have been moving more. Her biggest issue is lack of energy and not being able to focus. Reviewed patient health questionnaire (PHQ-9) with patient for follow up. Previously, patients score indicated signs/symptoms of depression.  Reviewed to see if patient  is improving symptom wise while in program.  Score improved and patient states that it is because they have been moving more. Her biggest issue was lack of energy and not being able to focus, but she states that her energy and focus have improved. She states that she has a good support system at home made up by her husband.      Interventions Encouraged to attend Pulmonary Rehabilitation for the exercise;Stress management education Encouraged to attend Pulmonary Rehabilitation for the exercise;Stress management education      Continue Psychosocial Services  Follow up required by staff Follow up required by staff               Psychosocial Discharge (Final Psychosocial Re-Evaluation):  Psychosocial Re-Evaluation - 11/20/21 1014       Psychosocial Re-Evaluation   Current issues with Current Depression;Current Anxiety/Panic    Expected Outcomes Reviewed patient health questionnaire (PHQ-9) with patient for follow up. Previously, patients score indicated signs/symptoms of depression.  Reviewed to see if patient is improving symptom wise while in program.  Score improved and patient  states that it is because they have been moving more. Her biggest issue was lack of energy and not being able to focus, but she states that her energy and focus have improved. She states that she has a good support system at home made up by her husband.    Interventions Encouraged to attend Pulmonary Rehabilitation for the exercise;Stress management education    Continue Psychosocial Services  Follow up required by staff             Education: Education Goals: Education classes will be provided on a weekly basis, covering required topics. Participant will state understanding/return demonstration of topics presented.  Learning Barriers/Preferences:   General Pulmonary Education Topics:  Infection Prevention: - Provides verbal and written material to individual with discussion of infection control including proper hand washing and proper equipment cleaning during exercise session. Flowsheet Row Pulmonary Rehab from 11/27/2021 in [facility] Cardiac and Pulmonary Rehab  Date 09/18/21  Educator NT  Instruction Review Code 1- Verbalizes Understanding       Falls Prevention: - Provides verbal and written material to individual with discussion of falls prevention and safety. Flowsheet Row Pulmonary Rehab from 11/27/2021 in [facility] Cardiac and Pulmonary Rehab  Date 09/16/21  Educator SB  Instruction Review Code 1- Verbalizes Understanding       Chronic Lung Disease Review: - Group verbal instruction with posters, models, PowerPoint presentations and videos,  to review new updates, new respiratory medications, new advancements in procedures and treatments. Providing information on websites and "800" numbers for continued self-education. Includes information about supplement oxygen, available portable oxygen systems, continuous and intermittent flow rates, oxygen safety, concentrators, and Medicare reimbursement for oxygen. Explanation of Pulmonary Drugs, including class, frequency, complications,  importance of spacers, rinsing mouth after steroid MDI's, and proper cleaning methods for nebulizers. Review of basic lung anatomy and physiology related to function, structure, and complications of lung disease. Review of risk factors. Discussion about methods for diagnosing sleep apnea and types of masks and machines for OSA. Includes a review of the use of types of environmental controls: home humidity, furnaces, filters, dust mite/pet prevention, HEPA vacuums. Discussion about weather changes, air quality and the benefits of nasal washing. Instruction on Warning signs, infection symptoms, calling MD promptly, preventive modes, and value of vaccinations. Review of effective airway clearance, coughing and/or vibration techniques. Emphasizing that all should Create an Action Plan. Written material given at  graduation. Flowsheet Row Pulmonary Rehab from 11/27/2021 in [facility] Cardiac and Pulmonary Rehab  Education need identified 09/18/21  Date 11/20/21  Educator [facility]  Instruction Review Code 1- Verbalizes Understanding       AED/CPR: - Group verbal and written instruction with the use of models to demonstrate the basic use of the AED with the basic ABC's of resuscitation.    Anatomy and Cardiac Procedures: - Group verbal and visual presentation and models provide information about basic cardiac anatomy and function. Reviews the testing methods done to diagnose heart disease and the outcomes of the test results. Describes the treatment choices: Medical Management, Angioplasty, or Coronary Bypass Surgery for treating various heart conditions including Myocardial Infarction, Angina, Valve Disease, and Cardiac Arrhythmias.  Written material given at graduation.   Medication Safety: - Group verbal and visual instruction to review commonly prescribed medications for heart and lung disease. Reviews the medication, class of the drug, and side effects. Includes the steps to properly store meds and maintain the  prescription regimen.  Written material given at graduation.   Other: -Provides group and verbal instruction on various topics (see comments)   Knowledge Questionnaire Score:  Knowledge Questionnaire Score - 09/18/21 1536       Knowledge Questionnaire Score   Pre Score 14/18              Core Components/Risk Factors/Patient Goals at Admission:  Personal Goals and Risk Factors at Admission - 09/18/21 1534       Core Components/Risk Factors/Patient Goals on Admission    Weight Management Yes    Intervention Weight Management: Develop a combined nutrition and exercise program designed to reach desired caloric intake, while maintaining appropriate intake of nutrient and fiber, sodium and fats, and appropriate energy expenditure required for the weight goal.;Weight Management/Obesity: Establish reasonable short term and long term weight goals.;Obesity: Provide education and appropriate resources to help participant work on and attain dietary goals.    Admit Weight 196 lb 6.4 oz (89.1 kg)    Goal Weight: Short Term 190 lb (86.2 kg)    Goal Weight: Long Term 185 lb (83.9 kg)    Expected Outcomes Short Term: Continue to assess and modify interventions until short term weight is achieved;Long Term: Adherence to nutrition and physical activity/exercise program aimed toward attainment of established weight goal;Weight Loss: Understanding of general recommendations for a balanced deficit meal plan, which promotes 1-2 lb weight loss per week and includes a negative energy balance of [phone] kcal/d    Improve shortness of breath with ADL's Yes    Intervention Provide education, individualized exercise plan and daily activity instruction to help decrease symptoms of SOB with activities of daily living.    Expected Outcomes Short Term: Improve cardiorespiratory fitness to achieve a reduction of symptoms when performing ADLs;Long Term: Be able to perform more ADLs without symptoms or delay the onset  of symptoms    Increase knowledge of respiratory medications and ability to use respiratory devices properly  Yes    Intervention Provide education and demonstration as needed of appropriate use of medications, inhalers, and oxygen therapy.    Expected Outcomes Short Term: Achieves understanding of medications use. Understands that oxygen is a medication prescribed by physician. Demonstrates appropriate use of inhaler and oxygen therapy.;Long Term: Maintain appropriate use of medications, inhalers, and oxygen therapy.    Diabetes Yes    Intervention Provide education about signs/symptoms and action to take for hypo/hyperglycemia.;Provide education about proper nutrition, including hydration, and aerobic/resistive exercise prescription  along with prescribed medications to achieve blood glucose in normal ranges: Fasting glucose 65-99 mg/dL    Expected Outcomes Short Term: Participant verbalizes understanding of the signs/symptoms and immediate care of hyper/hypoglycemia, proper foot care and importance of medication, aerobic/resistive exercise and nutrition plan for blood glucose control.;Long Term: Attainment of HbA1C < 7%.    Hypertension Yes    Intervention Provide education on lifestyle modifcations including regular physical activity/exercise, weight management, moderate sodium restriction and increased consumption of fresh fruit, vegetables, and low fat dairy, alcohol moderation, and smoking cessation.;Monitor prescription use compliance.    Expected Outcomes Short Term: Continued assessment and intervention until BP is < 140/11m HG in hypertensive participants. < 130/867mHG in hypertensive participants with diabetes, heart failure or chronic kidney disease.;Long Term: Maintenance of blood pressure at goal levels.    Lipids Yes    Intervention Provide education and support for participant on nutrition & aerobic/resistive exercise along with prescribed medications to achieve LDL <7044mHDL >3m67m   Expected Outcomes Short Term: Participant states understanding of desired cholesterol values and is compliant with medications prescribed. Participant is following exercise prescription and nutrition guidelines.;Long Term: Cholesterol controlled with medications as prescribed, with individualized exercise RX and with personalized nutrition plan. Value goals: LDL < 70mg26mL > 40 mg.             Education:Diabetes - Individual verbal and written instruction to review signs/symptoms of diabetes, desired ranges of glucose level fasting, after meals and with exercise. Acknowledge that pre and post exercise glucose checks will be done for 3 sessions at entry of program. [facility]  Date 09/18/21  Educator NT  Instruction Review Code 1- Verbalizes Understanding       Know Your Numbers and Heart Failure: - Group verbal and visual instruction to discuss disease risk factors for cardiac and pulmonary disease and treatment options.  Reviews associated critical values for Overweight/Obesity, Hypertension, Cholesterol, and Diabetes.  Discusses basics of heart failure: signs/symptoms and treatments.  Introduces Heart Failure Zone chart for action plan for heart failure.  Written material given at graduation. Flowsheet Row [facility]  Date 11/13/21  Educator SB  Instruction Review Code 1- Verbalizes Understanding       Core Components/Risk Factors/Patient Goals Review:   Goals and Risk Factor Review     Row Name 10/28/21 1023 11/20/21 1016           Core Components/Risk Factors/Patient Goals Review   Personal Goals Review Weight Management/Obesity;Hypertension;Lipids;Diabetes Weight Management/Obesity;Hypertension;Lipids;Diabetes      Review ShirlDarrises that she would like to lose some weight. She has a short term goal of losing 10 lbs and a long term goal of 25  lbs. She states that she has not been checking her BP at home but was encouraged to do so especially with exercise. ShirlOaklyn check her blood sugars at home and states that they are within normal ranges. ShirlAnnaleisees that she would like to lose some weight. She has a short term goal of losing 10 lbs and a long term goal of 25 lbs. She states that she has not been checking her BP at home but was encouraged to do so especially with exercise. ShirlAvanthika check her blood sugars at home and states that they have been a little high but are overall within normal ranges.      Expected Outcomes Short: begin  to checl BP at home. Long: Continue to monitor lifestyle risk factors. Short: begin to check BP at home. Long: Continue to monitor lifestyle risk factors.               Core Components/Risk Factors/Patient Goals at Discharge (Final Review):   Goals and Risk Factor Review - 11/20/21 1016       Core Components/Risk Factors/Patient Goals Review   Personal Goals Review Weight Management/Obesity;Hypertension;Lipids;Diabetes    Review Kayla Wu states that she would like to lose some weight. She has a short term goal of losing 10 lbs and a long term goal of 25 lbs. She states that she has not been checking her BP at home but was encouraged to do so especially with exercise. Janace does check her blood sugars at home and states that they have been a little high but are overall within normal ranges.    Expected Outcomes Short: begin to check BP at home. Long: Continue to monitor lifestyle risk factors.             ITP Comments:  ITP Comments     Row Name 09/16/21 1507 09/18/21 1448 10/09/21 1154 10/09/21 1332 11/06/21 0734   ITP Comments Virtual orientation call completed today. shehas an appointment on Date: 09/20/2021  for EP eval and gym Orientation.  Documentation of diagnosis can be found in [facility] Date: 08/01/2021 . Completed 6MWT and gym orientation. Initial ITP created and sent for review to  Dr. Ottie Glazier, Medical Director. First full day of exercise!  Patient was oriented to gym and equipment including functions, settings, policies, and procedures.  Patient's individual exercise prescription and treatment plan were reviewed.  All starting workloads were established based on the results of the 6 minute walk test done at initial orientation visit.  The plan for exercise progression was also introduced and progression will be customized based on patient's performance and goals. 30 Day review completed. Medical Director ITP review done, changes made as directed, and signed approval by Medical Director.    NEW 30 Day review completed. Medical Director ITP review done, changes made as directed, and signed approval by Medical Director.    [place] Name 11/12/21 1055 12/04/21 0951         ITP Comments Completed initial RD consultation 30 Day review completed. Medical Director ITP review done, changes made as directed, and signed approval by Medical Director.               Comments:

## 2021-12-06 ENCOUNTER — Encounter: Payer: Medicare HMO | Admitting: *Deleted

## 2021-12-06 DIAGNOSIS — U099 Post covid-19 condition, unspecified: Secondary | ICD-10-CM | POA: Diagnosis not present

## 2021-12-06 DIAGNOSIS — J849 Interstitial pulmonary disease, unspecified: Secondary | ICD-10-CM | POA: Diagnosis not present

## 2021-12-06 DIAGNOSIS — R0600 Dyspnea, unspecified: Secondary | ICD-10-CM | POA: Diagnosis not present

## 2021-12-06 NOTE — Progress Notes (Signed)
Daily Session Note  Patient Details  Name: Kayla Wu MRN: [id number] Date of Birth: 01/25/1946 Referring Provider:   Flowsheet Row Pulmonary Rehab from 09/18/2021 in [facility] Cardiac and Pulmonary Rehab  Referring Provider Aleskerov       Encounter Date: 12/06/2021  Check In:  Session Check In - 12/06/21 1006       Check-In   Supervising physician immediately available to respond to emergencies See telemetry face sheet for immediately available ER MD    Location [facility]-Cardiac & Pulmonary Rehab    Staff Present Susanne Bice, RN, BSN, CCRP;Joseph Hood, RCP,RRT,BSRT;Jessica Hawkins, MA, RCEP, CCRP, CCET    Virtual Visit No    Medication changes reported     No    Fall or balance concerns reported    No    Warm-up and Cool-down Performed on first and last piece of equipment    Resistance Training Performed Yes    VAD Patient? No    PAD/SET Patient? No      Pain Assessment   Currently in Pain? No/denies                Social History   Tobacco Use  Smoking Status Former   Packs/day: 2.00   Years: 42.00   Total pack years: 84.00   Types: Cigarettes   Quit date: 07/05/2007   Years since quitting: 14.4  Smokeless Tobacco Never    Goals Met:  Proper associated with RPD/PD & O2 Sat Independence with exercise equipment Exercise tolerated well No report of concerns or symptoms today  Goals Unmet:  Not Applicable  Comments: Pt able to follow exercise prescription today without complaint.  Will continue to monitor for progression.    Dr. Mark Miller is Medical Director for [facility].  Dr. Fuad Aleskerov is Medical Director for [facility]. 

## 2021-12-11 ENCOUNTER — Encounter: Payer: Medicare HMO | Admitting: *Deleted

## 2021-12-11 DIAGNOSIS — J849 Interstitial pulmonary disease, unspecified: Secondary | ICD-10-CM | POA: Diagnosis not present

## 2021-12-11 DIAGNOSIS — U099 Post covid-19 condition, unspecified: Secondary | ICD-10-CM | POA: Diagnosis not present

## 2021-12-11 DIAGNOSIS — R0600 Dyspnea, unspecified: Secondary | ICD-10-CM

## 2021-12-11 NOTE — Progress Notes (Signed)
Daily Session Note  Patient Details  Name: Kayla Wu MRN: [id number] Date of Birth: 22-Mar-1945 Referring Provider:   Flowsheet Row Pulmonary Rehab from 09/18/2021 in [facility] Cardiac and Pulmonary Rehab  Referring Provider Lanney Gins       Encounter Date: 12/11/2021  Check In:  Session Check In - 12/11/21 1118       Check-In   Supervising physician immediately available to respond to emergencies See telemetry face sheet for immediately available ER MD    Location [facility]    Staff Present Renita Papa, RN Odelia Gage, RN, ADN;Joseph Hood, RCP,RRT,BSRT;Noah Tickle, BS, Exercise Physiologist    Virtual Visit No    Medication changes reported     No    Fall or balance concerns reported    No    Warm-up and Cool-down Performed on first and last piece of equipment    Resistance Training Performed Yes    VAD Patient? No    PAD/SET Patient? No      Pain Assessment   Currently in Pain? No/denies               Exercise Prescription Changes - 12/11/21 1000       Home Exercise Plan   Plans to continue exercise at Home (comment)   walking   Frequency Add 2 additional days to program exercise sessions.    Initial Home Exercises Provided 12/11/21      Oxygen   Maintain Oxygen Saturation 88% or higher             Social History   Tobacco Use  Smoking Status Former   Packs/day: 2.00   Years: 42.00   Total pack years: 84.00   Types: Cigarettes   Quit date: 07/05/2007   Years since quitting: 14.4  Smokeless Tobacco Never    Goals Met:  Independence with exercise equipment Exercise tolerated well No report of concerns or symptoms today Strength training completed today  Goals Unmet:  Not Applicable  Comments: Pt able to follow exercise prescription today without complaint.  Will continue to monitor for progression.    Dr. Emily Filbert is Medical Director for [place].  Dr. Ottie Glazier is Medical  Director for [facility] Pulmonary Rehabilitation.

## 2021-12-13 ENCOUNTER — Encounter: Payer: Medicare HMO | Admitting: *Deleted

## 2021-12-13 DIAGNOSIS — R0600 Dyspnea, unspecified: Secondary | ICD-10-CM | POA: Diagnosis not present

## 2021-12-13 DIAGNOSIS — U099 Post covid-19 condition, unspecified: Secondary | ICD-10-CM

## 2021-12-13 DIAGNOSIS — J849 Interstitial pulmonary disease, unspecified: Secondary | ICD-10-CM

## 2021-12-13 NOTE — Progress Notes (Signed)
Daily Session Note  Patient Details  Name: Kayla Wu MRN: [id number] Date of Birth: 1945/07/09 Referring Provider:   Flowsheet Row Pulmonary Rehab from 09/18/2021 in [facility] Cardiac and Pulmonary Rehab  Referring Provider Lanney Gins       Encounter Date: 12/13/2021  Check In:  Session Check In - 12/13/21 1045       Check-In   Supervising physician immediately available to respond to emergencies See telemetry face sheet for immediately available ER MD    Location [facility] Re[facility]    Staff Present Heath Lark, RN, BSN, CCRP;Jessica Orwell, MA, RCEP, CCRP, CCET;Joseph Peck, [place]    Virtual Visit No    Medication changes reported     No    Fall or balance concerns reported    No    Warm-up and Cool-down Performed on first and last piece of equipment    Resistance Training Performed Yes    VAD Patient? No    PAD/SET Patient? No      Pain Assessment   Currently in Pain? No/denies                Social History   Tobacco Use  Smoking Status Former   Packs/day: 2.00   Years: 42.00   Total pack years: 84.00   Types: Cigarettes   Quit date: 07/05/2007   Years since quitting: 14.4  Smokeless Tobacco Never    Goals Met:  Proper associated with RPD/PD & O2 Sat Independence with exercise equipment Exercise tolerated well No report of concerns or symptoms today  Goals Unmet:  Not Applicable  Comments: Pt able to follow exercise prescription today without complaint.  Will continue to monitor for progression.    Dr. Emily Filbert is Medical Director for [place].  Dr. Ottie Glazier is Medical Director for [facility] Pulmonary Rehabilitation.

## 2021-12-16 ENCOUNTER — Encounter: Payer: Medicare HMO | Admitting: *Deleted

## 2021-12-16 DIAGNOSIS — R0600 Dyspnea, unspecified: Secondary | ICD-10-CM

## 2021-12-16 DIAGNOSIS — U099 Post covid-19 condition, unspecified: Secondary | ICD-10-CM

## 2021-12-16 DIAGNOSIS — J849 Interstitial pulmonary disease, unspecified: Secondary | ICD-10-CM | POA: Diagnosis not present

## 2021-12-16 NOTE — Progress Notes (Signed)
Daily Session Note  Patient Details  Name: Kayla Wu MRN: [id number] Date of Birth: 1945/03/31 Referring Provider:   Flowsheet Row Pulmonary Rehab from 09/18/2021 in [facility] Cardiac and Pulmonary Rehab  Referring Provider Aleskerov       Encounter Date: 12/16/2021  Check In:  Session Check In - 12/16/21 0958       Check-In   Staff Present Antionette Fairy, BS, Exercise Physiologist;Kelly Amedeo Plenty, BS, ACSM CEP, Exercise Physiologist;Lamark Schue Tamala Julian, RN, ADN    Virtual Visit No    Medication changes reported     No    Fall or balance concerns reported    No    Warm-up and Cool-down Performed on first and last piece of equipment    Resistance Training Performed Yes    VAD Patient? No    PAD/SET Patient? No      Pain Assessment   Currently in Pain? No/denies                Social History   Tobacco Use  Smoking Status Former   Packs/day: 2.00   Years: 42.00   Total pack years: 84.00   Types: Cigarettes   Quit date: 07/05/2007   Years since quitting: 14.4  Smokeless Tobacco Never    Goals Met:  Independence with exercise equipment Exercise tolerated well No report of concerns or symptoms today Strength training completed today  Goals Unmet:  Not Applicable  Comments: Pt able to follow exercise prescription today without complaint.  Will continue to monitor for progression.    Dr. Emily Filbert is Medical Director for [place].  Dr. Ottie Glazier is Medical Director for [facility] Pulmonary Rehabilitation.

## 2021-12-18 ENCOUNTER — Encounter: Payer: Medicare HMO | Admitting: *Deleted

## 2021-12-18 DIAGNOSIS — J849 Interstitial pulmonary disease, unspecified: Secondary | ICD-10-CM

## 2021-12-18 DIAGNOSIS — U099 Post covid-19 condition, unspecified: Secondary | ICD-10-CM | POA: Diagnosis not present

## 2021-12-18 DIAGNOSIS — R0600 Dyspnea, unspecified: Secondary | ICD-10-CM | POA: Diagnosis not present

## 2021-12-18 NOTE — Progress Notes (Signed)
Daily Session Note  Patient Details  Name: Kayla Wu MRN: [id number] Date of Birth: [birth year] Referring Provider:   Flowsheet Row Pulmonary Rehab from 09/18/2021 in [facility] Cardiac and Pulmonary Rehab  Referring Provider Lanney Gins       Encounter Date: 12/18/2021  Check In:  Session Check In - 12/18/21 1006       Check-In   Supervising physician immediately available to respond to emergencies See telemetry face sheet for immediately available ER MD    Location [facility] Pul[facility] Re[facility]    Staff Present Antionette Fairy, BS, Exercise Physiologist;Joseph Rosebud Poles, RN, [place]    Virtual Visit No    Medication changes reported     No    Fall or balance concerns reported    No    Warm-up and Cool-down Performed on first and last piece of equipment    Resistance Training Performed Yes    VAD Patient? No    PAD/SET Patient? No      Pain Assessment   Currently in Pain? No/denies                Social History   Tobacco Use  Smoking Status Former   Packs/day: 2.00   Years: 42.00   Total pack years: 84.00   Types: Cigarettes   Quit date: 07/05/2007   Years since quitting: 14.4  Smokeless Tobacco Never    Goals Met:  Independence with exercise equipment Exercise tolerated well No report of concerns or symptoms today Strength training completed today  Goals Unmet:  Not Applicable  Comments: Pt able to follow exercise prescription today without complaint.  Will continue to monitor for progression.    Dr. Emily Filbert is Medical Director for [place].  Dr. Ottie Glazier is Medical Director for [facility] Pulmonary Rehabilitation.

## 2021-12-20 ENCOUNTER — Encounter: Payer: Medicare HMO | Admitting: *Deleted

## 2021-12-20 DIAGNOSIS — U099 Post covid-19 condition, unspecified: Secondary | ICD-10-CM

## 2021-12-20 DIAGNOSIS — J849 Interstitial pulmonary disease, unspecified: Secondary | ICD-10-CM

## 2021-12-20 DIAGNOSIS — R0600 Dyspnea, unspecified: Secondary | ICD-10-CM

## 2021-12-20 NOTE — Progress Notes (Signed)
Daily Session Note  Patient Details  Name: Kayla Wu MRN: [id number] Date of Birth: [birth year] Referring Provider:   Flowsheet Row Pulmonary Rehab from 09/18/2021 in [facility] Cardiac and Pulmonary Rehab  Referring Provider Lanney Gins       Encounter Date: 12/20/2021  Check In:  Session Check In - 12/20/21 0954       Check-In   Supervising physician immediately available to respond to emergencies See telemetry face sheet for immediately available ER MD    Location [facility] Pulmonary Rehab    Staff Present Alberteen Sam, MA, RCEP, CCRP, CCET;Joseph Lyons, [place], RN, [place]    Virtual Visit No    Medication changes reported     No    Fall or balance concerns reported    No    Warm-up and Cool-down Performed on first and last piece of equipment    Resistance Training Performed Yes    VAD Patient? No    PAD/SET Patient? No      Pain Assessment   Currently in Pain? No/denies                Social History   Tobacco Use  Smoking Status Former   Packs/day: 2.00   Years: 42.00   Total pack years: 84.00   Types: Cigarettes   Quit date: 07/05/2007   Years since quitting: 14.4  Smokeless Tobacco Never    Goals Met:  Independence with exercise equipment Exercise tolerated well No report of concerns or symptoms today Strength training completed today  Goals Unmet:  Not Applicable  Comments: Pt able to follow exercise prescription today without complaint.  Will continue to monitor for progression.    Dr. Emily Filbert is Medical Director for [place].  Dr. Ottie Glazier is Medical Director for [facility] Pulmonary Rehabilitation.

## 2021-12-23 ENCOUNTER — Encounter: Payer: Medicare HMO | Admitting: *Deleted

## 2021-12-23 VITALS — Ht 64.5 in | Wt 198.3 lb

## 2021-12-23 DIAGNOSIS — R0600 Dyspnea, unspecified: Secondary | ICD-10-CM | POA: Diagnosis not present

## 2021-12-23 DIAGNOSIS — J849 Interstitial pulmonary disease, unspecified: Secondary | ICD-10-CM | POA: Diagnosis not present

## 2021-12-23 DIAGNOSIS — U099 Post covid-19 condition, unspecified: Secondary | ICD-10-CM

## 2021-12-23 NOTE — Progress Notes (Signed)
Daily Session Note  Patient Details  Name: Kayla Wu MRN: [id number] Date of Birth: 03-May-1945 Referring Provider:   Flowsheet Row Pulmonary Rehab from 09/18/2021 in [facility] Cardiac and Pulmonary Rehab  Referring Provider Lanney Gins       Encounter Date: 12/23/2021  Check In:  Session Check In - 12/23/21 0959       Check-In   Supervising physician immediately available to respond to emergencies See telemetry face sheet for immediately available ER MD    Location [facility]Card[facility] & Pulmonary Rehab    Staff Present Darlyne Russian, RN, ADN;Meredith Sherryll Burger, RN BSN;Noah Tickle, BS, Exercise Physiologist;Kelly Amedeo Plenty, BS, ACSM CEP, Exercise Physiologist    Virtual Visit No    Medication changes reported     No    Fall or balance concerns reported    No    Warm-up and Cool-down Performed on first and last piece of equipment    Resistance Training Performed Yes    VAD Patient? No    PAD/SET Patient? No      Pain Assessment   Currently in Pain? No/denies                Social History   Tobacco Use  Smoking Status Former   Packs/day: 2.00   Years: 42.00   Total pack years: 84.00   Types: Cigarettes   Quit date: 07/05/2007   Years since quitting: 14.4  Smokeless Tobacco Never    Goals Met:  Independence with exercise equipment Exercise tolerated well No report of concerns or symptoms today Strength training completed today  Goals Unmet:  Not Applicable  Comments: Pt able to follow exercise prescription today without complaint.  Will continue to monitor for progression.   [place] Name 09/18/21 1510 12/23/21 1045       6 Minute Walk   Phase Initial Discharge    Distance 950 feet 1005 feet    Distance % Change -- 5.8 %    Distance Feet Change -- 55 ft    Walk Time 5.53 minutes 6 minutes    # of Rest Breaks 2 0    MPH 1.95 1.9    METS 1.55 1.54    RPE 13 13    Perceived Dyspnea  3 1    VO2 Peak 5.44 5.38    Symptoms Yes (comment) No     Comments SOB --    Resting HR 59 bpm 65 bpm    Resting BP 118/62 124/62    Resting Oxygen Saturation  94 % 92 %    Exercise Oxygen Saturation  during 6 min walk 88 % 87 %    Max Ex. HR 86 bpm 84 bpm    Max Ex. BP 144/72 132/64    2 Minute Post BP 132/68 122/82      Interval HR   1 Minute HR 84 76    2 Minute HR 86 79    3 Minute HR 86 81    4 Minute HR 85 83    5 Minute HR 86 84    6 Minute HR 86 83    2 Minute Post HR 64 70    Interval Heart Rate? Yes Yes      Interval Oxygen   Interval Oxygen? Yes Yes    Baseline Oxygen Saturation % 94 % 92 %    1 Minute Oxygen Saturation % 91 % 89 %    1 Minute Liters of Oxygen 0 L  RA 0 L    2 Minute Oxygen Saturation % 88 % 87 %    2 Minute Liters of Oxygen 0 L  RA 0 L    3 Minute Oxygen Saturation % 90 % 88 %    3 Minute Liters of Oxygen 0 L  RA 0 L    4 Minute Oxygen Saturation % 92 % 89 %    4 Minute Liters of Oxygen 0 L  RA 0 L    5 Minute Oxygen Saturation % 91 % 88 %    5 Minute Liters of Oxygen 0 L  RA 0 L    6 Minute Oxygen Saturation % 90 % 88 %    6 Minute Liters of Oxygen 0 L  RA 0 L    2 Minute Post Oxygen Saturation % 93 % 95 %    2 Minute Post Liters of Oxygen 0 L  RA 0 L                Dr. Emily Filbert is Medical Director for [place].  Dr. Ottie Glazier is Medical Director for [facility] Pulmonary Rehabilitation.

## 2021-12-24 ENCOUNTER — Telehealth: Payer: Self-pay | Admitting: Cardiovascular Disease

## 2021-12-24 MED ORDER — DILTIAZEM HCL ER COATED BEADS 180 MG PO CP24: 180.0000 mg | ORAL_CAPSULE | Freq: Every day | ORAL | 2 refills | Status: DC

## 2021-12-24 NOTE — Telephone Encounter (Signed)
Requested Prescriptions   Signed Prescriptions Disp Refills   diltiazem (CARDIZEM CD) 180 MG 24 hr capsule 90 capsule 2    Sig: Take 1 capsule (180 mg total) by mouth daily.    Authorizing Provider: Minna Merritts    Ordering User: Raelene Bott, Reba Hulett L

## 2021-12-24 NOTE — Telephone Encounter (Signed)
*  STAT* If patient is at the pharmacy, call can be transferred to refill team.   1. Which medications need to be refilled? (please list name of each medication and dose if known) diltiazem (CARDIZEM CD) 180 MG 24 hr capsule   2. Which pharmacy/location (including street and city if local pharmacy) is medication to be sent to? [place], [place]   3. Do they need a 30 day or 90 day supply? [place]

## 2021-12-25 ENCOUNTER — Encounter: Payer: Medicare HMO | Admitting: *Deleted

## 2021-12-25 DIAGNOSIS — U099 Post covid-19 condition, unspecified: Secondary | ICD-10-CM | POA: Diagnosis not present

## 2021-12-25 DIAGNOSIS — J849 Interstitial pulmonary disease, unspecified: Secondary | ICD-10-CM | POA: Diagnosis not present

## 2021-12-25 DIAGNOSIS — R0600 Dyspnea, unspecified: Secondary | ICD-10-CM | POA: Diagnosis not present

## 2021-12-25 NOTE — Progress Notes (Signed)
Daily Session Note  Patient Details  Name: Kayla Wu MRN: [id number] Date of Birth: Sep 26, 1945 Referring Provider:   Flowsheet Row Pulmonary Rehab from 09/18/2021 in [facility] Cardiac and Pulmonary Rehab  Referring Provider Lanney Gins       Encounter Date: 12/25/2021  Check In:  Session Check In - 12/25/21 1010       Check-In   Supervising physician immediately available to respond to emergencies See telemetry face sheet for immediately available ER MD    Location [facility]Cardiac & Pulmonary Rehab    Staff Present Heath Lark, RN, BSN, CCRP;Neosha Switalski Tamala Julian, RN, [place] Blase, MS, ASCM CEP, Exercise Physiologist    Virtual Visit No    Medication changes reported     No    Fall or balance concerns reported    No    Warm-up and Cool-down Performed on first and last piece of equipment    Resistance Training Performed Yes    VAD Patient? No    PAD/SET Patient? No      Pain Assessment   Currently in Pain? No/denies                Social History   Tobacco Use  Smoking Status Former   Packs/day: 2.00   Years: 42.00   Total pack years: 84.00   Types: Cigarettes   Quit date: 07/05/2007   Years since quitting: 14.4  Smokeless Tobacco Never    Goals Met:  Independence with exercise equipment Exercise tolerated well No report of concerns or symptoms today Strength training completed today  Goals Unmet:  Not Applicable  Comments: Pt able to follow exercise prescription today without complaint.  Will continue to monitor for progression.    Dr. Emily Filbert is Medical Director for [place].  Dr. Ottie Glazier is Medical Director for [facility] Pulmonary Rehabilitation.

## 2021-12-30 ENCOUNTER — Encounter: Payer: Medicare HMO | Admitting: *Deleted

## 2021-12-30 DIAGNOSIS — E7800 Pure hypercholesterolemia, unspecified: Secondary | ICD-10-CM | POA: Diagnosis not present

## 2021-12-30 DIAGNOSIS — E1159 Type 2 diabetes mellitus with other circulatory complications: Secondary | ICD-10-CM | POA: Diagnosis not present

## 2021-12-30 DIAGNOSIS — J849 Interstitial pulmonary disease, unspecified: Secondary | ICD-10-CM | POA: Diagnosis not present

## 2021-12-30 DIAGNOSIS — U099 Post covid-19 condition, unspecified: Secondary | ICD-10-CM | POA: Diagnosis not present

## 2021-12-30 DIAGNOSIS — R0600 Dyspnea, unspecified: Secondary | ICD-10-CM | POA: Diagnosis not present

## 2021-12-30 DIAGNOSIS — Z79899 Other long term (current) drug therapy: Secondary | ICD-10-CM | POA: Diagnosis not present

## 2021-12-30 NOTE — Progress Notes (Signed)
Daily Session Note  Patient Details  Name: LONNI DIRDEN MRN: [id number] Date of Birth: Jan 02, 1946 Referring Provider:   Flowsheet Row Pulmonary Rehab from 09/18/2021 in [facility] Cardiac and Pulmonary Rehab  Referring Provider Lanney Gins       Encounter Date: 12/30/2021  Check In:  Session Check In - 12/30/21 1119       Check-In   Supervising physician immediately available to respond to emergencies See telemetry face sheet for immediately available ER MD    Location [facility]Cardiac & Pulmonary Rehab    Staff Present Darlyne Russian, RN, Doyce Para, BS, ACSM CEP, Exercise Physiologist;Noah Tickle, BS, Exercise Physiologist    Virtual Visit No    Medication changes reported     No    Fall or balance concerns reported    No    Warm-up and Cool-down Performed on first and last piece of equipment    Resistance Training Performed Yes    VAD Patient? No    PAD/SET Patient? No      Pain Assessment   Currently in Pain? No/denies                Social History   Tobacco Use  Smoking Status Former   Packs/day: 2.00   Years: 42.00   Total pack years: 84.00   Types: Cigarettes   Quit date: 07/05/2007   Years since quitting: 14.4  Smokeless Tobacco Never    Goals Met:  Independence with exercise equipment Exercise tolerated well No report of concerns or symptoms today Strength training completed today  Goals Unmet:  Not Applicable  Comments: Pt able to follow exercise prescription today without complaint.  Will continue to monitor for progression.    Dr. Emily Filbert is Medical Director for [place].  Dr. Ottie Glazier is Medical Director for [facility] Pulmonary Rehabilitation.

## 2021-12-31 DIAGNOSIS — E7800 Pure hypercholesterolemia, unspecified: Secondary | ICD-10-CM | POA: Diagnosis not present

## 2021-12-31 DIAGNOSIS — E669 Obesity, unspecified: Secondary | ICD-10-CM | POA: Diagnosis not present

## 2021-12-31 DIAGNOSIS — I10 Essential (primary) hypertension: Secondary | ICD-10-CM | POA: Diagnosis not present

## 2021-12-31 DIAGNOSIS — E118 Type 2 diabetes mellitus with unspecified complications: Secondary | ICD-10-CM | POA: Diagnosis not present

## 2021-12-31 DIAGNOSIS — J431 Panlobular emphysema: Secondary | ICD-10-CM | POA: Diagnosis not present

## 2022-01-01 ENCOUNTER — Encounter: Payer: Medicare HMO | Admitting: *Deleted

## 2022-01-01 ENCOUNTER — Encounter: Payer: Self-pay | Admitting: *Deleted

## 2022-01-01 DIAGNOSIS — J849 Interstitial pulmonary disease, unspecified: Secondary | ICD-10-CM

## 2022-01-01 DIAGNOSIS — U099 Post covid-19 condition, unspecified: Secondary | ICD-10-CM

## 2022-01-01 DIAGNOSIS — R0600 Dyspnea, unspecified: Secondary | ICD-10-CM

## 2022-01-01 NOTE — Progress Notes (Signed)
Pulmonary Individual Treatment Plan  Patient Details  Name: Kayla Wu MRN: [id number] Date of Birth: [birth year] Referring Provider:   Flowsheet Row Pulmonary Rehab from 09/18/2021 in [facility] Cardiac and Pulmonary Rehab  Referring Provider Lanney Gins       Initial Encounter Date:  Flowsheet Row Pulmonary Rehab from 09/18/2021 in [facility] Cardiac and Pulmonary Rehab  Date 09/18/21       Visit Diagnosis: Post covid-19 condition, unspecified  Dyspnea, unspecified type  ILD (interstitial lung disease) ([place])  Patient's Home Medications on Admission:  Current Outpatient Medications:    albuterol (VENTOLIN HFA) 108 (90 Base) MCG/ACT inhaler, Inhale into the lungs., Disp: , Rfl:    aspirin EC 81 MG tablet, Take by mouth., Disp: , Rfl:    atorvastatin (LIPITOR) 20 MG tablet, Take 1 tablet by mouth daily., Disp: , Rfl:    bisoprolol (ZEBETA) 5 MG tablet, Take 1 tablet (5 mg total) by mouth daily., Disp: 90 tablet, Rfl: 3   diltiazem (CARDIZEM CD) 180 MG 24 hr capsule, Take 1 capsule (180 mg total) by mouth daily., Disp: 90 capsule, Rfl: 2   diphenhydrAMINE (BENADRYL) 50 MG capsule, Take 1 capsule 1 hour prior to procedure, Disp: 1 capsule, Rfl: 0   ferrous sulfate 325 (65 FE) MG tablet, Take 325 mg by mouth daily with breakfast., Disp: , Rfl:    Fluticasone-Umeclidin-Vilant (TRELEGY ELLIPTA) 100-62.5-25 MCG/ACT AEPB, Inhale into the lungs., Disp: , Rfl:    gabapentin (NEURONTIN) 600 MG tablet, Take 600 mg by mouth at bedtime., Disp: , Rfl:    hydrOXYzine (ATARAX) 50 MG tablet, , Disp: , Rfl:    insulin NPH-regular Human (70-30) 100 UNIT/ML injection, Inject into the skin as needed. (Patient not taking: Reported on 09/03/2021), Disp: , Rfl:    LORazepam (ATIVAN) 1 MG tablet, Take 1 mg by mouth 2 (two) times daily as needed for anxiety or sleep., Disp: , Rfl:    losartan (COZAAR) 100 MG tablet, Take 100 mg by mouth daily., Disp: , Rfl:    magnesium oxide (MAG-OX) 400 MG tablet, Take 400 mg by  mouth 2 (two) times daily., Disp: , Rfl:    metFORMIN (GLUCOPHAGE-XR) 500 MG 24 hr tablet, Take 1,500 mg by mouth daily with supper., Disp: , Rfl:    metoprolol tartrate (LOPRESSOR) 25 MG tablet, Take 1 tablet (25 mg total) by mouth once for 1 dose. Take 2 hours prior to your CT., Disp: 1 tablet, Rfl: 0   omeprazole (PRILOSEC) 40 MG capsule, Take 1 capsule by mouth daily., Disp: , Rfl:    predniSONE (DELTASONE) 50 MG tablet, Take 50 mg 13 hours, 7 hour, and 1 hour prior to procedure, Disp: 3 tablet, Rfl: 0   TRULICITY 1.5 JI/9.6VE SOPN, Inject 1.5 mg into the skin once a week., Disp: , Rfl:    venlafaxine XR (EFFEXOR-XR) 150 MG 24 hr capsule, Take 150 mg by mouth daily. (take with 37.50m capsule to equal 187.55mtotal), Disp: , Rfl:    venlafaxine XR (EFFEXOR-XR) 37.5 MG 24 hr capsule, Take 37.5 mg by mouth daily. (take with 15073mapsule to equal 187.5mg42mtal), Disp: , Rfl:    vitamin B-12 (CYANOCOBALAMIN) 1000 MCG tablet, Take 1,000 mcg by mouth daily., Disp: , Rfl:   Past Medical History: Past Medical History:  Diagnosis Date   Atrial fibrillation with RVR (HCC)[employer]/27/2022   Chronic urticaria    COPD (chronic obstructive pulmonary disease) (HCC)[place] COVID-19 virus infection 06/2020   DDD (degenerative disc disease)  Diabetes mellitus ([place])    Diastolic dysfunction    a. 01/2018 Echo: Nl EF. Triv AI/MR/PR. Mild TR; b. 06/2020 Echo: EF 60-65%, no rwma, gr1 DD. Nl RV size/fxn. RVSP 14.38mHg.   GERD (gastroesophageal reflux disease)    Hepatic steatosis    Hypertension    Menopausal state    [place]bid [place]    Non-cardiac chest pain    a. 07/2014 MV: EF 75%, no ischemia/artifact.   [place]ian cancer [place]   s/p chemo    PAF [place]oxysmal [place] fibrillation) ([place]    a. Dx 06/2020 in setting of COVID infection; b. CHA2DS2VASc = 5-->eliquis.   Personal history of chemotherapy 2012   ovarian   Pneumonia 1987   [place]    Rectovaginal fistula     Tobacco  Use: Social History   Tobacco Use  Smoking Status Former   Packs/day: 2.00   Years: 42.00   Total pack years: 84.00   Types: Cigarettes   Quit date: 07/05/2007   Years since quitting: 14.5  Smokeless Tobacco Never    Labs: Review Flowsheet       Latest Ref Rng & Units 01/19/2020 06/25/2020 06/26/2020  Labs for ITP Cardiac and Pulmonary Rehab  Trlycerides <150 mg/dL - 110  -  Hemoglobin A1c 4.8 - 5.6 % 6.8  - 7.4      Pulmonary Assessment Scores:  Pulmonary Assessment Scores     Row Name 09/18/21 1537 12/25/21 1046       ADL UCSD   ADL Phase Entry Exit    SOB Score total 76 47    Rest 1 1    Walk 2 1    Stairs 5 4    Bath 2 1    Dress 1 2    Shop 5 3      CAT Score   CAT Score 21 14      mMRC Score   mMRC Score 2 --             UCSD: Self-administered rating of dyspnea associated with activities of daily living (ADLs) 6-point scale (0 = "not at all" to 5 = "maximal or unable to do because of breathlessness")  Scoring Scores range from 0 to 120.  Minimally important difference is 5 units  CAT: CAT can identify the health impairment of COPD patients and is better correlated with disease progression.  CAT has a scoring range of zero to 40. The CAT score is classified into four groups of low (less than 10), medium (10 - 20), high (21-30) and very high (31-40) based on the impact level of disease on health status. A CAT score over 10 suggests significant symptoms.  A worsening CAT score could be explained by an exacerbation, poor medication adherence, poor inhaler technique, or progression of COPD or comorbid conditions.  CAT MCID is 2 points  mMRC: mMRC (Modified Medical Research Council) Dyspnea Scale is used to assess the degree of baseline functional disability in patients of respiratory disease due to dyspnea. No minimal important difference is established. A decrease in score of 1 point or greater is considered a positive change.   Pulmonary Function  Assessment:   Exercise Target Goals: Exercise Program Goal: Individual exercise prescription set using results from initial 6 min walk test and THRR while considering  patient's activity barriers and safety.   Exercise Prescription Goal: Initial exercise prescription builds to 30-45 minutes a day of aerobic activity, 2-3 days per week.  Home exercise guidelines  will be given to patient during program as part of exercise prescription that the participant will acknowledge.  Education: Aerobic Exercise: - Group verbal and visual presentation on the components of exercise prescription. Introduces F.I.T.T principle from ACSM for exercise prescriptions.  Reviews F.I.T.T. principles of aerobic exercise including progression. Written material given at graduation. Flowsheet Row Pulmonary Rehab from 11/27/2021 in [facility] Cardiac and Pulmonary Rehab  Education need identified 09/18/21       Education: Resistance Exercise: - Group verbal and visual presentation on the components of exercise prescription. Introduces F.I.T.T principle from ACSM for exercise prescriptions  Reviews F.I.T.T. principles of resistance exercise including progression. Written material given at graduation.    Education: Exercise & Equipment Safety: - Individual verbal instruction and demonstration of equipment use and safety with use of the equipment. Flowsheet Row Pulmonary Rehab from 11/27/2021 in [facility] Cardiac and Pulmonary Rehab  Date 09/18/21  Educator NT  Instruction Review Code 1- Verbalizes Understanding       Education: Exercise Physiology & General Exercise Guidelines: - Group verbal and written instruction with models to review the exercise physiology of the cardiovascular system and associated critical values. Provides general exercise guidelines with specific guidelines to those with heart or lung disease.  Flowsheet Row Pulmonary Rehab from 11/27/2021 in [facility] Cardiac and Pulmonary Rehab  Education need  identified 09/18/21       Education: Flexibility, Balance, Mind/Body Relaxation: - Group verbal and visual presentation with interactive activity on the components of exercise prescription. Introduces F.I.T.T principle from ACSM for exercise prescriptions. Reviews F.I.T.T. principles of flexibility and balance exercise training including progression. Also discusses the mind body connection.  Reviews various relaxation techniques to help reduce and manage stress (i.e. Deep breathing, progressive muscle relaxation, and visualization). Balance handout provided to take home. Written material given at graduation.   Activity Barriers & Risk Stratification:  Activity Barriers & Cardiac Risk Stratification - 09/18/21 1516       Activity Barriers & Cardiac Risk Stratification   Activity Barriers Shortness of Breath             6 Minute Walk:  6 Minute Walk     Row Name 09/18/21 1510 12/23/21 1045       6 Minute Walk   Phase Initial Discharge    Distance 950 feet 1005 feet    Distance % Change -- 5.8 %    Distance Feet Change -- 55 ft    Walk Time 5.53 minutes 6 minutes    # of Rest Breaks 2 0    MPH 1.95 1.9    METS 1.55 1.54    RPE 13 13    Perceived Dyspnea  3 1    VO2 Peak 5.44 5.38    Symptoms Yes (comment) No    Comments SOB --    Resting HR 59 bpm 65 bpm    Resting BP 118/62 124/62    Resting Oxygen Saturation  94 % 92 %    Exercise Oxygen Saturation  during 6 min walk 88 % 87 %    Max Ex. HR 86 bpm 84 bpm    Max Ex. BP 144/72 132/64    2 Minute Post BP 132/68 122/82      Interval HR   1 Minute HR 84 76    2 Minute HR 86 79    3 Minute HR 86 81    4 Minute HR 85 83    5 Minute HR 86 84    6  Minute HR 86 83    2 Minute Post HR 64 70    Interval Heart Rate? Yes Yes      Interval Oxygen   Interval Oxygen? Yes Yes    Baseline Oxygen Saturation % 94 % 92 %    1 Minute Oxygen Saturation % 91 % 89 %    1 Minute Liters of Oxygen 0 L  RA 0 L    2 Minute Oxygen  Saturation % 88 % 87 %    2 Minute Liters of Oxygen 0 L  RA 0 L    3 Minute Oxygen Saturation % 90 % 88 %    3 Minute Liters of Oxygen 0 L  RA 0 L    4 Minute Oxygen Saturation % 92 % 89 %    4 Minute Liters of Oxygen 0 L  RA 0 L    5 Minute Oxygen Saturation % 91 % 88 %    5 Minute Liters of Oxygen 0 L  RA 0 L    6 Minute Oxygen Saturation % 90 % 88 %    6 Minute Liters of Oxygen 0 L  RA 0 L    2 Minute Post Oxygen Saturation % 93 % 95 %    2 Minute Post Liters of Oxygen 0 L  RA 0 L            Oxygen Initial Assessment:  Oxygen Initial Assessment - 09/16/21 1422       Home Oxygen   Home Oxygen Device None    Sleep Oxygen Prescription None    Home Exercise Oxygen Prescription None    Home Resting Oxygen Prescription None      Initial 6 min Walk   Oxygen Used None      Program Oxygen Prescription   Program Oxygen Prescription None      Intervention   Short Term Goals To learn and demonstrate proper pursed lip breathing techniques or other breathing techniques. ;To learn and demonstrate proper use of respiratory medications;To learn and understand importance of maintaining oxygen saturations>88%    Long  Term Goals Verbalizes importance of monitoring SPO2 with pulse oximeter and return demonstration;Maintenance of O2 saturations>88%;Exhibits proper breathing techniques, such as pursed lip breathing or other method taught during program session;Demonstrates proper use of MDI's;Compliance with respiratory medication             Oxygen Re-Evaluation:  Oxygen Re-Evaluation     Row Name 10/09/21 1154 10/28/21 1027 11/20/21 1017 12/11/21 1011       Program Oxygen Prescription   Program Oxygen Prescription None None None None      Home Oxygen   Home Oxygen Device None None None None    Sleep Oxygen Prescription None None None None    Home Exercise Oxygen Prescription None None None None    Home Resting Oxygen Prescription None None None None      Goals/Expected  Outcomes   Short Term Goals To learn and demonstrate proper pursed lip breathing techniques or other breathing techniques.  To learn and demonstrate proper pursed lip breathing techniques or other breathing techniques.  To learn and demonstrate proper pursed lip breathing techniques or other breathing techniques.  To learn and demonstrate proper pursed lip breathing techniques or other breathing techniques. ;To learn and understand importance of monitoring SPO2 with pulse oximeter and demonstrate accurate use of the pulse oximeter.;To learn and understand importance of maintaining oxygen saturations>88%;To learn and demonstrate proper use of respiratory medications  Long  Term Goals Exhibits proper breathing techniques, such as pursed lip breathing or other method taught during program session Exhibits proper breathing techniques, such as pursed lip breathing or other method taught during program session Exhibits proper breathing techniques, such as pursed lip breathing or other method taught during program session Verbalizes importance of monitoring SPO2 with pulse oximeter and return demonstration;Maintenance of O2 saturations>88%;Exhibits proper breathing techniques, such as pursed lip breathing or other method taught during program session;Compliance with respiratory medication    Comments Reviewed PLB technique with pt.  Talked about how it works and it's importance in maintaining their exercise saturations. We reviewed PLB technique and she was encouraged to practice. She states that she used to be on oxygen but her saturations were staying around 94% so she was able to come off. She does have a pulse-ox at home and states that her oxygen saturations have stayed up at home. We reviewed PLB technique and she was encouraged to practice. She states that she does forget about PLB at times but has been working on it. She states that she used to be on oxygen but her saturations were staying around 94% so she  was able to come off. She does have a pulse-ox at home and states that her oxygen saturations have stayed up at home. Kayla Wu is doing well in rehab. She is using her PLB and finds it helpful for walking.  She is working with her doctor to try to get a 1L tank to use for walking.  She keeps an eye on her saturations and they are usually pretty good.    Goals/Expected Outcomes Short: Become more profiecient at using PLB.   Long: Become independent at using PLB. Short: Become more profiecient at using PLB.   Long: Become independent at using PLB. Short: Become more profiecient at using PLB.   Long: Become independent at using PLB. Short: Get small tank for walking Long: Continue use PLB for SOB             Oxygen Discharge (Final Oxygen Re-Evaluation):  Oxygen Re-Evaluation - 12/11/21 1011       Program Oxygen Prescription   Program Oxygen Prescription None      Home Oxygen   Home Oxygen Device None    Sleep Oxygen Prescription None    Home Exercise Oxygen Prescription None    Home Resting Oxygen Prescription None      Goals/Expected Outcomes   Short Term Goals To learn and demonstrate proper pursed lip breathing techniques or other breathing techniques. ;To learn and understand importance of monitoring SPO2 with pulse oximeter and demonstrate accurate use of the pulse oximeter.;To learn and understand importance of maintaining oxygen saturations>88%;To learn and demonstrate proper use of respiratory medications    Long  Term Goals Verbalizes importance of monitoring SPO2 with pulse oximeter and return demonstration;Maintenance of O2 saturations>88%;Exhibits proper breathing techniques, such as pursed lip breathing or other method taught during program session;Compliance with respiratory medication    Comments Kayla Wu is doing well in rehab. She is using her PLB and finds it helpful for walking.  She is working with her doctor to try to get a 1L tank to use for walking.  She keeps an eye on  her saturations and they are usually pretty good.    Goals/Expected Outcomes Short: Get small tank for walking Long: Continue use PLB for SOB             Initial Exercise Prescription:  Initial Exercise Prescription -  09/18/21 1500       Date of Initial Exercise RX and Referring Provider   Date 09/18/21    Referring Provider Aleskerov      Oxygen   Maintain Oxygen Saturation 88% or higher      Treadmill   MPH 1.5    Grade 0    Minutes 15    METs 2.15      NuStep   Level 1    SPM 80    Minutes 15    METs 1.55      T5 Nustep   Level 1    SPM 80    Minutes 15    METs 1.55      Biostep-RELP   Level 1    SPM 60    Minutes 15    METs 1.55      Track   Laps 15    Minutes 15    METs 1.82      Prescription Details   Frequency (times per week) 3    Duration Progress to 30 minutes of continuous aerobic without signs/symptoms of physical distress      Intensity   THRR 40-80% of Max Heartrate 93-127    Ratings of Perceived Exertion 11-15    Perceived Dyspnea 0-4      Progression   Progression Continue to progress workloads to maintain intensity without signs/symptoms of physical distress.      Resistance Training   Training Prescription Yes    Weight 3 lb    Reps 10-15             Perform Capillary Blood Glucose checks as needed.  Exercise Prescription Changes:   Exercise Prescription Changes     Row Name 09/18/21 1500 10/22/21 0900 11/04/21 1300 11/18/21 1500 12/02/21 1300     Response to Exercise   Blood Pressure (Admit) 118/62 110/62 122/70 132/74 138/72   Blood Pressure (Exercise) 144/72 124/74 152/84 -- --   Blood Pressure (Exit) 120/64 110/68 122/64 118/60 122/60   Heart Rate (Admit) 59 bpm 59 bpm 68 bpm 65 bpm 62 bpm   Heart Rate (Exercise) 86 bpm 83 bpm 86 bpm 86 bpm 86 bpm   Heart Rate (Exit) 63 bpm 71 bpm 63 bpm 72 bpm 65 bpm   Oxygen Saturation (Admit) 94 % 92 % 92 % 93 % 93 %   Oxygen Saturation (Exercise) 88 % 92 % 88 % 88 % 88 %    Oxygen Saturation (Exit) 91 % 92 % 90 % 90 % 97 %   Rating of Perceived Exertion (Exercise) _0 Perceived Dyspnea (Exercise) _1 Symptoms _2    Comments Completed 6MWT -- -- -- --   Duration Progress to 30 minutes of  aerobic without signs/symptoms of physical distress Progress to 30 minutes of  aerobic without signs/symptoms of physical distress Progress to 30 minutes of  aerobic without signs/symptoms of physical distress Continue with 30 min of aerobic exercise without signs/symptoms of physical distress. Continue with 30 min of aerobic exercise without signs/symptoms of physical distress.   Intensity _3      Progression   Progression -- Continue to progress workloads to maintain intensity without signs/symptoms of physical distress. Continue to progress workloads to maintain intensity without signs/symptoms of physical distress. Continue to progress workloads to maintain intensity without signs/symptoms of physical distress. Continue to progress workloads to  maintain intensity without signs/symptoms of physical distress.   Average METs -- 2.46 2.34 2.61 2.97     Resistance Training   Training Prescription -- Yes Yes Yes Yes   Weight -- 3 lb 3 lb 3 lb 3 lb   Reps -- 10-15 10-15 10-15 10-15     Interval Training   Interval Training -- No No No No     Treadmill   MPH -- 1.5 1.8 -- --   Grade -- 0 0 -- --   Minutes -- 15 15 -- --   METs -- 2.15 2.38 -- --     Recumbant Bike   Level -- -- -- 2 2   RPM -- -- -- 50 --   Watts -- -- -- 18 19   Minutes -- -- -- 15 15   METs -- -- -- 2.63 3.1     NuStep   Level -- 2 -- 3 5   Minutes -- 15 -- 15 15   METs -- 2.6 -- 2.6 3.5     T5 Nustep   Level -- 3 -- 3 2   Minutes -- 15 -- 15 15   METs -- -- -- 2 1.9     Biostep-RELP   Level -- -- 2 2 --   Minutes -- -- 15 15 --   METs -- -- 3 2 --     Track   Laps -- _0 Minutes -- _1 METs -- 2.63 2.63 2.63 2.63     Oxygen   Maintain Oxygen Saturation -- 88% or higher 88% or higher 88% or higher 88% or higher    Row Name 12/11/21 1000 12/16/21 1400 12/30/21 1300         Response to Exercise   Blood Pressure (Admit) -- 118/60 124/62     Blood Pressure (Exit) -- 110/62 126/68     Heart Rate (Admit) -- 66 bpm 67 bpm     Heart Rate (Exercise) -- 82 bpm 84 bpm     Heart Rate (Exit) -- 63 bpm 65 bpm     Oxygen Saturation (Admit) -- 93 % 94 %     Oxygen Saturation (Exercise) -- 88 % 87 %     Oxygen Saturation (Exit) -- 93 % 96 %     Rating of Perceived Exertion (Exercise) -- 15 13     Perceived Dyspnea (Exercise) -- 3 1     Symptoms -- SOB SOB     Duration -- Continue with 30 min of aerobic exercise without signs/symptoms of physical distress. Continue with 30 min of aerobic exercise without signs/symptoms of physical distress.     Intensity -- THRR unchanged THRR unchanged       Progression   Progression -- Continue to progress workloads to maintain intensity without signs/symptoms of physical distress. Continue to progress workloads to maintain intensity without signs/symptoms of physical distress.     Average METs -- 2.76 2.75       Resistance Training   Training Prescription -- Yes Yes     Weight -- 3 lb 2 lb     Reps -- 10-15 10-15       Interval Training   Interval Training -- No No       Recumbant Bike   Level -- 1 3     Minutes -- 15 15     METs -- 2.66 2.6  NuStep   Level -- 5 4     Minutes -- 15 15     METs -- 3.6 3.5       Biostep-RELP   Level -- 2 --     Minutes -- 15 --     METs -- 2 --       Track   Laps -- 27 --     Minutes -- 15 --     METs -- 2.47 --       Home Exercise Plan   Plans to continue exercise at Home (comment)  walking Home (comment)  walking Home (comment)  walking     Frequency Add 2 additional days to program exercise sessions. Add 2 additional days to program exercise sessions. Add  2 additional days to program exercise sessions.     Initial Home Exercises Provided 12/11/21 12/11/21 12/11/21       Oxygen   Maintain Oxygen Saturation 88% or higher 88% or higher 88% or higher              Exercise Comments:   Exercise Comments     Row Name 10/09/21 1154           Exercise Comments First full day of exercise!  Patient was oriented to gym and equipment including functions, settings, policies, and procedures.  Patient's individual exercise prescription and treatment plan were reviewed.  All starting workloads were established based on the results of the 6 minute walk test done at initial orientation visit.  The plan for exercise progression was also introduced and progression will be customized based on patient's performance and goals.                Exercise Goals and Review:   Exercise Goals     Row Name 09/18/21 1528             Exercise Goals   Increase Physical Activity Yes       Intervention Provide advice, education, support and counseling about physical activity/exercise needs.;Develop an individualized exercise prescription for aerobic and resistive training based on initial evaluation findings, risk stratification, comorbidities and participant's personal goals.       Expected Outcomes Short Term: Attend rehab on a regular basis to increase amount of physical activity.;Long Term: Add in home exercise to make exercise part of routine and to increase amount of physical activity.;Long Term: Exercising regularly at least 3-5 days a week.       Increase Strength and Stamina Yes       Intervention Provide advice, education, support and counseling about physical activity/exercise needs.;Develop an individualized exercise prescription for aerobic and resistive training based on initial evaluation findings, risk stratification, comorbidities and participant's personal goals.       Expected Outcomes Short Term: Increase workloads from initial exercise  prescription for resistance, speed, and METs.;Long Term: Improve cardiorespiratory fitness, muscular endurance and strength as measured by increased METs and functional capacity (6MWT);Short Term: Perform resistance training exercises routinely during rehab and add in resistance training at home       Able to understand and use rate of perceived exertion (RPE) scale Yes       Intervention Provide education and explanation on how to use RPE scale       Expected Outcomes Short Term: Able to use RPE daily in rehab to express subjective intensity level;Long Term:  Able to use RPE to guide intensity level when exercising independently       Able to understand  and use Dyspnea scale Yes       Intervention Provide education and explanation on how to use Dyspnea scale       Expected Outcomes Short Term: Able to use Dyspnea scale daily in rehab to express subjective sense of shortness of breath during exertion;Long Term: Able to use Dyspnea scale to guide intensity level when exercising independently       Knowledge and understanding of Target Heart Rate Range (THRR) Yes       Intervention Provide education and explanation of THRR including how the numbers were predicted and where they are located for reference       Expected Outcomes Short Term: Able to state/look up THRR;Long Term: Able to use THRR to govern intensity when exercising independently;Short Term: Able to use daily as guideline for intensity in rehab       Able to check pulse independently Yes       Intervention Review the importance of being able to check your own pulse for safety during independent exercise;Provide education and demonstration on how to check pulse in carotid and radial arteries.       Expected Outcomes Short Term: Able to explain why pulse checking is important during independent exercise;Long Term: Able to check pulse independently and accurately       Understanding of Exercise Prescription Yes       Intervention Provide  education, explanation, and written materials on patient's individual exercise prescription       Expected Outcomes Short Term: Able to explain program exercise prescription;Long Term: Able to explain home exercise prescription to exercise independently                Exercise Goals Re-Evaluation :  Exercise Goals Re-Evaluation     Row Name 10/09/21 1155 10/22/21 0906 10/28/21 1012 11/04/21 1324 11/18/21 1550     Exercise Goal Re-Evaluation   Exercise Goals Review Able to understand and use rate of perceived exertion (RPE) scale;Knowledge and understanding of Target Heart Rate Range (THRR);Able to understand and use Dyspnea scale;Understanding of Exercise Prescription Increase Physical Activity;Increase Strength and Stamina;Understanding of Exercise Prescription Increase Physical Activity;Increase Strength and Stamina;Understanding of Exercise Prescription Increase Physical Activity;Increase Strength and Stamina;Understanding of Exercise Prescription Increase Physical Activity;Increase Strength and Stamina;Understanding of Exercise Prescription   Comments Reviewed RPE and dyspnea scales, THR and program prescription with pt today.  Pt voiced understanding and was given a copy of goals to take home. Kayla Wu is doing well in rehab.  She is on level 3 on the T5 NuStep and walking 30 laps. We will continue to monitor her progress. Kayla Wu is doing well in rehab. She feels that she is progressing well in the program. She states that she has definitely seen some improvement especially with her endurance. She has progressed to be able to do the treadmill and not just the track. She has been walking at home on her days away from rehab. She states that she usually walks about 20 min at home. We will continue to monitor her progress. Kayla Wu continues to do well in rehab. She has gotten up to 30 laps walking on the track. She has also increased her workload on the treadmill to a speed of 1.8 mph with no  incline. Kayla Wu would benefit from adding in some incline on the treadmill as tolerated. We will continue to monitor her progress. Kayla Wu continues to do well in rehab. She has increased to level 3 on the T4 Nustep. She has been consistently reaching 30  laps on the track and should be encouraged to increase that next sessions.  She is not quite hitting her THR but RPEs are appropriate. We will continue to monitor.   Expected Outcomes Short: Use RPE daily to regulate intensity. Long: Follow program prescription in THR. Short: Increase seated equipment workloads Long: conitnue to improve stamina Short: Continue to walk on days away from rehab. Long: Continue to increase strength and stamina. Short: Add incline on the treadmill. Long: Continue to increase strength and stamina. Short: Increase laps on track Long: Continue to increase overall MET level    Row Name 11/20/21 1004 12/02/21 1326 12/11/21 1005 12/11/21 1054 12/16/21 1436     Exercise Goal Re-Evaluation   Exercise Goals Review Increase Physical Activity;Increase Strength and Stamina;Understanding of Exercise Prescription Increase Physical Activity;Increase Strength and Stamina;Understanding of Exercise Prescription Increase Physical Activity;Increase Strength and Stamina;Understanding of Exercise Prescription Increase Physical Activity;Increase Strength and Stamina;Understanding of Exercise Prescription Increase Physical Activity;Increase Strength and Stamina;Understanding of Exercise Prescription   Comments Kayla Wu continues to do well in rehab. She states that she is able to walk better, breathe better, and perform ADLs better since starting the program. She has dealt with some hip pain that has bothered her during exercise at times. Kayla Wu has been walking at home on her days away from rehab for around 15 minutes. She states that walking has been the most difficult form of exercise for her. We will continue to monitor her progress. Kayla Wu is  doing well in rehab. She recently improved her overall average MET level to 2.97 METs. She also improved to level 5 on the T4 machine. She also has consistently gotten 30 laps on the track as well. We will continue to monitor her progress in the program. Kayla Wu is doing well in rehab.  She is doing some walking at home on her off days.  She is feeling like her stamina is coming back, her strength not quite yet.  She has noticed that her breathing is getting better. Reviewed home exercise with pt today.  Pt plans to walk for exercise.  Reviewed THR, pulse, RPE, sign and symptoms, pulse oximetery and when to call 911 or MD.  Also discussed weather considerations and indoor options.  Pt voiced understanding. Kayla Wu continues to do well in rehab. Her level on the T4 Nustep has been consistent at level 5, however, she is still at level 1 on the recumbent bike and staff will encourage patient to increase her load on it next time. She is due for her post 6MWT next week and hope to see improvement. Will continue to monitor.   Expected Outcomes Short: Continue to walk at home on days away from rehab. Long: Continue to increase strength and stamina. Short: Continue to increase workloads, and push for more laps on the track. Long: Continue to increase strength and stamina. Short: Continue to add in more walking at home Long: Continue to improve stamina Short: Continue to monitor HR and O2 during exercise Long: Continue independent exercise at home at appropriate prescription Short: Improve on post 6MWT Long: Continue to increase overall MET level    Row Name 12/30/21 1329             Exercise Goal Re-Evaluation   Exercise Goals Review Increase Physical Activity;Increase Strength and Stamina;Understanding of Exercise Prescription       Comments Kayla Wu is doing well in rehab. She recently improved on her post 6MWT by 55 ft. She also improved to level 3 on the  recumbent bike. She has consistently worked at a MET  level above 2.75 METs as well. We will continue to monitor her progress until she graduates from the program.       Expected Outcomes Short: Graduate. Long: Continue to increase strength and stamina.                Discharge Exercise Prescription (Final Exercise Prescription Changes):  Exercise Prescription Changes - 12/30/21 1300       Response to Exercise   Blood Pressure (Admit) 124/62    Blood Pressure (Exit) 126/68    Heart Rate (Admit) 67 bpm    Heart Rate (Exercise) 84 bpm    Heart Rate (Exit) 65 bpm    Oxygen Saturation (Admit) 94 %    Oxygen Saturation (Exercise) 87 %    Oxygen Saturation (Exit) 96 %    Rating of Perceived Exertion (Exercise) 13    Perceived Dyspnea (Exercise) 1    Symptoms SOB    Duration Continue with 30 min of aerobic exercise without signs/symptoms of physical distress.    Intensity THRR unchanged      Progression   Progression Continue to progress workloads to maintain intensity without signs/symptoms of physical distress.    Average METs 2.75      Resistance Training   Training Prescription Yes    Weight 2 lb    Reps 10-15      Interval Training   Interval Training No      Recumbant Bike   Level 3    Minutes 15    METs 2.6      NuStep   Level 4    Minutes 15    METs 3.5      Home Exercise Plan   Plans to continue exercise at Home (comment)   walking   Frequency Add 2 additional days to program exercise sessions.    Initial Home Exercises Provided 12/11/21      Oxygen   Maintain Oxygen Saturation 88% or higher             Nutrition:  Target Goals: Understanding of nutrition guidelines, daily intake of sodium <1557m, cholesterol <2014m calories 30% from fat and 7% or less from saturated fats, daily to have 5 or more servings of fruits and vegetables.  Education: All About Nutrition: -Group instruction provided by verbal, written material, interactive activities, discussions, models, and posters to present general  guidelines for heart healthy nutrition including fat, fiber, MyPlate, the role of sodium in heart healthy nutrition, utilization of the nutrition label, and utilization of this knowledge for meal planning. Follow up email sent as well. Written material given at graduation.   Biometrics:  Pre Biometrics - 09/18/21 1528       Pre Biometrics   Height 5' 4.5" (1.638 m)    Weight 196 lb 6.4 oz (89.1 kg)    Waist Circumference 41.5 inches    Hip Circumference 45.5 inches    Waist to Hip Ratio 0.91 %    BMI (Calculated) 33.2    Single Leg Stand 2.71 seconds             Post Biometrics - 12/23/21 1049        Post  Biometrics   Height 5' 4.5" (1.638 m)    Weight 198 lb 4.8 oz (89.9 kg)    Waist Circumference 41.5 inches    Hip Circumference 47 inches    Waist to Hip Ratio 0.88 %    BMI (Calculated) 33.52[place]  Single Leg Stand 3 seconds             Nutrition Therapy Plan and Nutrition Goals:  Nutrition Therapy & Goals - 11/12/21 1020       Nutrition Therapy   Diet Heart healthy, low Na, T2DM MNT    Drug/Food Interactions Statins/Certain Fruits    Protein (specify units) 85-90g    Fiber 23 grams    Whole Grain Foods 3 servings    Saturated Fats 13 max. grams    Fruits and Vegetables 8 servings/day    Sodium 2 grams      Personal Nutrition Goals   Nutrition Goal ST: add in heart healthy snacks, add fiber, healthy fat, and protein to smaller portions of dessert foods LT: limit saturated fat <13g/day, limit added sugar <24g/day, maintain BG <7, eat at least 23g of fiber per day, limit salt <2g/day    Comments 76 y.o. 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 PMHx includes HTN, COPD, GERD, T2DM, HLD, Ovarian cancer (diagnosis 2012) with recurrences in 2017 and 2021, DDD, chronic anemia. PSHx includes cholecystecomy, bowel obstruction. Relevant medications includes lipitor, ferrous sulfate, lorazepam, magnesium, omeprazole, B-12, humulin, metformin,  trulicity.  Last A1C was 6.1. B: scrambled eggs with toast and coffee L: PB sandwich D: grill or pressure cooker: chicken, steak, or pork chops with potato, red meat 1x/week. Soups and beans like pinto beans a couple times per month. They go out to eat 1-2x/week. They use a small amount of oil (canola), whole grain bread, and she will use salt (she is not sure how much). Drinks: water with 1 glass of sweet tea for dinner. Rhyen reports her blood sugar will go up and down; lows of 50-65 and highers of 150. She will have sweets once per day like cake, cookies, ice cream. Discussed general heart healthy eating as well as T2DM MNT. Suggested lowering her portion sizes of sweet desserts and adding extra protien, healthy fat, and fiber; greek yogurt and berries with cake, peanutbutter with cookie, and nuts/seeds like walnuts to ice cream as this may help with her BG control without removing these items completely. Also recommended adding in heart healthy snacks with fiber, healthy fat, and protein to her day - fruit with nuts/seeds, soup with beans and some crackers, 1/2 sandwich, bean salad for example as this can control her BG to prevent BG lows as well as sneak in some extra nutritionally dense foods.      Intervention Plan   Intervention Prescribe, educate and counsel regarding individualized specific dietary modifications aiming towards targeted core components such as weight, hypertension, lipid management, diabetes, heart failure and other comorbidities.    Expected Outcomes Short Term Goal: Understand basic principles of dietary content, such as calories, fat, sodium, cholesterol and nutrients.;Short Term Goal: A plan has been developed with personal nutrition goals set during dietitian appointment.;Long Term Goal: Adherence to prescribed nutrition plan.             Nutrition Assessments:  MEDIFICTS Score Key: ?70 Need to make dietary changes  40-70 Heart Healthy Diet ? 40 Therapeutic Level  Cholesterol Diet  Flowsheet Row Pulmonary Rehab from 12/25/2021 in [facility]iac and Pulmonary Rehab  Picture Your Plate Total Score on Discharge 50      Picture Your Plate Scores: <12 Unhealthy dietary pattern with much room for improvement. 41-50 Dietary pattern unlikely to meet recommendations for good health and room for improvement. 51-60 More healthful dietary pattern, with some room  for improvement.  >60 Healthy dietary pattern, although there may be some specific behaviors that could be improved.   Nutrition Goals Re-Evaluation:  Nutrition Goals Re-Evaluation     [place] Name 10/28/21 1022 11/20/21 1011 12/11/21 1008         Goals   Nutrition Goal -- -- Short: add more fruits and vegetables to diet. Long: Continue to practice her healthy eating patterns.     Comment Kayla Wu has deferred to speak with the RD at this time. Anniemae spoke with the RD on the phone. They talked about adding more fruits and vegetables on her plate. They also discussed the importance of healthy snacks between meals to help manage her glucose levels. She is working on implementing these habits. Kayla Wu is doing well in rehab.   She admits to not sticking to it like she should. She is getting in lots of fruit and trying to add variety.  She has not watching her salt intake. We also talked about balancing carbs with protein and fat. She is doing good with portion control.     Expected Outcome -- Short: add more fruits and vegetables to diet. Long: Continue to practice her healthy eating patterns. Short: Work on [employer].  Long: COnitnue to focus on healthy eating habits              Nutrition Goals Discharge (Final Nutrition Goals Re-Evaluation):  Nutrition Goals Re-Evaluation - 12/11/21 1008       Goals   Nutrition Goal Short: add more fruits and vegetables to diet. Long: Continue to practice her healthy eating patterns.    Comment Kayla Wu is doing well in rehab.   She admits to not sticking to  it like she should. She is getting in lots of fruit and trying to add variety.  She has not watching her salt intake. We also talked about balancing carbs with protein and fat. She is doing good with portion control.    Expected Outcome Short: Work on balancing meals.  Long: COnitnue to focus on healthy eating habits             Psychosocial: Target Goals: Acknowledge presence or absence of significant depression and/or stress, maximize coping skills, provide positive support system. Participant is able to verbalize types and ability to use techniques and skills needed for reducing stress and depression.   Education: Stress, Anxiety, and Depression - Group verbal and visual presentation to define topics covered.  Reviews how body is impacted by stress, anxiety, and depression.  Also discusses healthy ways to reduce stress and to treat/manage anxiety and depression.  Written material given at graduation. Flowsheet Row Pulmonary Rehab from 11/27/2021 in [facility] Rehab  Date 11/27/21  Educator [facility]  Instruction Review Code 1- [employer] Understanding       Education: Sleep Hygiene -Provides group verbal and written instruction about how sleep can affect your health.  Define sleep hygiene, discuss sleep cycles and impact of sleep habits. Review good sleep hygiene tips.    Initial Review & Psychosocial Screening:  Initial Psych Review & Screening - 09/16/21 1423       Initial Review   Current issues with Current Depression;Current Anxiety/Panic;Current Psychotropic Meds      Family Dynamics   Good Support System? Yes   Merrilee Seashore, husband     Barriers   Psychosocial barriers to participate in program There are no identifiable barriers or psychosocial needs.      Screening Interventions   Interventions Encouraged to  exercise;To provide support and resources with identified psychosocial needs;Provide feedback about the scores to participant    Expected Outcomes Short Term  goal: Utilizing psychosocial counselor, staff and physician to assist with identification of specific Stressors or current issues interfering with healing process. Setting desired goal for each stressor or current issue identified.;Long Term Goal: Stressors or current issues are controlled or eliminated.;Short Term goal: Identification and review with participant of any Quality of Life or Depression concerns found by scoring the questionnaire.;Long Term goal: The participant improves quality of Life and PHQ9 Scores as seen by post scores and/or verbalization of changes             Quality of Life Scores:  Scores of 19 and below usually indicate a poorer quality of life in these areas.  A difference of  2-3 points is a clinically meaningful difference.  A difference of 2-3 points in the total score of the Quality of Life Index has been associated with significant improvement in overall quality of life, self-image, physical symptoms, and general health in studies assessing change in quality of life.  PHQ-9: Review Flowsheet  More data may exist      12/25/2021 12/04/2021 11/04/2021 10/11/2021 09/18/2021  Depression screen PHQ 2/9  Decreased Interest 1 0 _0 Down, Depressed, Hopeless 1 0 1 0 2  PHQ - 2 Score 2 0 _1 Altered sleeping _2 0 2  Tired, decreased energy 3 0 _3 Change in appetite 2 0 1 0 3  Feeling bad or failure about yourself  1 0 1 1 0  Trouble concentrating 1 0 _4 Moving slowly or fidgety/restless 1 0 1 0 1  Suicidal thoughts 1 0 0 0 2  PHQ-9 Score _5 Difficult doing work/chores Not difficult at all Not difficult at all Not difficult at all Not difficult at all Somewhat difficult   Interpretation of Total Score  Total Score Depression Severity:  1-4 = Minimal depression, 5-9 = Mild depression, 10-14 = Moderate depression, 15-19 = Moderately severe depression, 20-27 = Severe depression   Psychosocial Evaluation and Intervention:  Psychosocial  Evaluation - 09/16/21 1427       Psychosocial Evaluation & Interventions   Interventions Encouraged to exercise with the program and follow exercise prescription    Comments Carlye has no barriers to attending the program. She lives with her husband Jolaine Artist is her support. She wants to gain more stamina and decrease her shortness of breath. She is on effoxor fro some depression. She is managing well with her meds. She is ready to get started.    Expected Outcomes STG Kayla Wu is able to attend all scheduled sessions with progess in her exerciseprescription and improvement with her shortness of breath symptoms. LTG [employer] with progression of her exercise after discharge    Continue Psychosocial Services  Follow up required by staff             Psychosocial Re-Evaluation:  Psychosocial Re-Evaluation     [place] Name 10/11/21 1025 11/20/21 1014 12/04/21 0957 12/11/21 1006       Psychosocial Re-Evaluation   Current issues with Current Depression;Current Anxiety/Panic Current Depression;Current Anxiety/Panic Current Depression;Current Anxiety/Panic Current Depression;Current Anxiety/Panic;Current Sleep Concerns    Comments -- -- Kayla Wu is doing well in rehab.  Her PHQ scores have continued to improve.  She is down to a 2 now.  She is still struggling with her sleep  but feeling better overall. [place] is doing well in rehab.  She is feeling good overall. PHQ has improved since starting.  She continues to struggle with sleep.  She has not brought it up with her doctor yet, but plans to at the next appointment.    Expected Outcomes Reviewed patient health questionnaire (PHQ-9) with patient for follow up. Previously, patients score indicated signs/symptoms of depression.  Reviewed to see if patient is improving symptom wise while in program.  Score improved and patient states that it is because they have been moving more. Her biggest issue is lack of energy and not being able to focus. Reviewed  patient health questionnaire (PHQ-9) with patient for follow up. Previously, patients score indicated signs/symptoms of depression.  Reviewed to see if patient is improving symptom wise while in program.  Score improved and patient states that it is because they have been moving more. Her biggest issue was lack of energy and not being able to focus, but she states that her energy and focus have improved. She states that she has a good support system at home made up by her husband. Short: Continue to work on sleep Long: Continue to stay positive Short: Continue to work on sleep and talk to doctor  Long; Continue to look for positive    Interventions Encouraged to attend Pulmonary Rehabilitation for the exercise;Stress management education Encouraged to attend Pulmonary Rehabilitation for the exercise;Stress management education -- Encouraged to attend Pulmonary Rehabilitation for the exercise;Stress management education    Continue Psychosocial Services  Follow up required by staff Follow up required by staff -- Follow up required by staff             Psychosocial Discharge (Final Psychosocial Re-Evaluation):  Psychosocial Re-Evaluation - 12/11/21 1006       Psychosocial Re-Evaluation   Current issues with Current Depression;Current Anxiety/Panic;Current Sleep Concerns    Comments Kayla Wu is doing well in rehab.  She is feeling good overall. PHQ has improved since starting.  She continues to struggle with sleep.  She has not brought it up with her doctor yet, but plans to at the next appointment.    Expected Outcomes Short: Continue to work on sleep and talk to doctor  Long; Continue to look for positive    Interventions Encouraged to attend Pulmonary Rehabilitation for the exercise;Stress management education    Continue Psychosocial Services  Follow up required by staff             Education: Education Goals: Education classes will be provided on a weekly basis, covering required topics.  Participant will state understanding/return demonstration of topics presented.  Learning Barriers/Preferences:   General Pulmonary Education Topics:  Infection Prevention: - Provides verbal and written material to individual with discussion of infection control including proper hand washing and proper equipment cleaning during exercise session. Flowsheet Row Pulmonary Rehab from 11/27/2021 in [facility] Cardiac and Pulmonary Rehab  Date 09/18/21  Educator NT  Instruction Review Code 1- Verbalizes Understanding       Falls Prevention: - Provides verbal and written material to individual with discussion of falls prevention and safety. Flowsheet Row Pulmonary Rehab from 11/27/2021 in [facility] Cardiac and Pulmonary Rehab  Date 09/16/21  Educator SB  Instruction Review Code 1- Verbalizes Understanding       Chronic Lung Disease Review: - Group verbal instruction with posters, models, PowerPoint presentations and videos,  to review new updates, new respiratory medications, new advancements in procedures and treatments. Providing information on websites and "  800" numbers for continued self-education. Includes information about supplement oxygen, available portable oxygen systems, continuous and intermittent flow rates, oxygen safety, concentrators, and Medicare reimbursement for oxygen. Explanation of Pulmonary Drugs, including class, frequency, complications, importance of spacers, rinsing mouth after steroid MDI's, and proper cleaning methods for nebulizers. Review of basic lung anatomy and physiology related to function, structure, and complications of lung disease. Review of risk factors. Discussion about methods for diagnosing sleep apnea and types of masks and machines for OSA. Includes a review of the use of types of environmental controls: home humidity, furnaces, filters, dust mite/pet prevention, HEPA vacuums. Discussion about weather changes, air quality and the benefits of nasal washing.  Instruction on Warning signs, infection symptoms, calling MD promptly, preventive modes, and value of vaccinations. Review of effective airway clearance, coughing and/or vibration techniques. Emphasizing that all should Create an Action Plan. Written material given at graduation. Flowsheet Row Pulmonary Rehab from 11/27/2021 in [facility] Card[facility] and Pulmonary Rehab  Education need identified 09/18/21  Date 11/20/21  Educator [facility]  Instruction Review Code 1- Verbalizes Understanding       AED/CPR: - Group verbal and written instruction with the use of models to demonstrate the basic use of the AED with the basic ABC's of resuscitation.    Anatomy and Cardiac Procedures: - Group verbal and visual presentation and models provide information about basic cardiac anatomy and function. Reviews the testing methods done to diagnose heart disease and the outcomes of the test results. Describes the treatment choices: Medical Management, Angioplasty, or Coronary Bypass Surgery for treating various heart conditions including Myocardial Infarction, Angina, Valve Disease, and Cardiac Arrhythmias.  Written material given at graduation.   Medication Safety: - Group verbal and visual instruction to review commonly prescribed medications for heart and lung disease. Reviews the medication, class of the drug, and side effects. Includes the steps to properly store meds and maintain the prescription regimen.  Written material given at graduation.   Other: -Provides group and verbal instruction on various topics (see comments)   Knowledge Questionnaire Score:  Knowledge Questionnaire Score - 12/25/21 1046       Knowledge Questionnaire Score   Post Score 15/18              Core Components/Risk Factors/Patient Goals at Admission:  Personal Goals and Risk Factors at Admission - 09/18/21 1534       Core Components/Risk Factors/Patient Goals on Admission    Weight Management Yes    Intervention Weight  Management: Develop a combined nutrition and exercise program designed to reach desired caloric intake, while maintaining appropriate intake of nutrient and fiber, sodium and fats, and appropriate energy expenditure required for the weight goal.;Weight Management/Obesity: Establish reasonable short term and long term weight goals.;Obesity: Provide education and appropriate resources to help participant work on and attain dietary goals.    Admit Weight 196 lb 6.4 oz (89.1 kg)    Goal Weight: Short Term 190 lb (86.2 kg)    Goal Weight: Long Term 185 lb (83.9 kg)    Expected Outcomes Short Term: Continue to assess and modify interventions until short term weight is achieved;Long Term: Adherence to nutrition and physical activity/exercise program aimed toward attainment of established weight goal;Weight Loss: Understanding of general recommendations for a balanced deficit meal plan, which promotes 1-2 lb weight loss per week and includes a negative energy balance of [phone] kcal/d    Improve shortness of breath with ADL's Yes    Intervention Provide education, individualized exercise plan and  daily activity instruction to help decrease symptoms of SOB with activities of daily living.    Expected Outcomes Short Term: Improve cardiorespiratory fitness to achieve a reduction of symptoms when performing ADLs;Long Term: Be able to perform more ADLs without symptoms or delay the onset of symptoms    Increase knowledge of respiratory medications and ability to use respiratory devices properly  Yes    Intervention Provide education and demonstration as needed of appropriate use of medications, inhalers, and oxygen therapy.    Expected Outcomes Short Term: Achieves understanding of medications use. Understands that oxygen is a medication prescribed by physician. Demonstrates appropriate use of inhaler and oxygen therapy.;Long Term: Maintain appropriate use of medications, inhalers, and oxygen therapy.    Diabetes Yes     Intervention Provide education about signs/symptoms and action to take for hypo/hyperglycemia.;Provide education about proper nutrition, including hydration, and aerobic/resistive exercise prescription along with prescribed medications to achieve blood glucose in normal ranges: Fasting glucose 65-99 mg/dL    Expected Outcomes Short Term: Participant verbalizes understanding of the signs/symptoms and immediate care of hyper/hypoglycemia, proper foot care and importance of medication, aerobic/resistive exercise and nutrition plan for blood glucose control.;Long Term: Attainment of HbA1C < 7%.    Hypertension Yes    Intervention Provide education on lifestyle modifcations including regular physical activity/exercise, weight management, moderate sodium restriction and increased consumption of fresh fruit, vegetables, and low fat dairy, alcohol moderation, and smoking cessation.;Monitor prescription use compliance.    Expected Outcomes Short Term: Continued assessment and intervention until BP is < 140/25m HG in hypertensive participants. < 130/886mHG in hypertensive participants with diabetes, heart failure or chronic kidney disease.;Long Term: Maintenance of blood pressure at goal levels.    Lipids Yes    Intervention Provide education and support for participant on nutrition & aerobic/resistive exercise along with prescribed medications to achieve LDL <7028mHDL >21m88m  Expected Outcomes Short Term: Participant states understanding of desired cholesterol values and is compliant with medications prescribed. Participant is following exercise prescription and nutrition guidelines.;Long Term: Cholesterol controlled with medications as prescribed, with individualized exercise RX and with personalized nutrition plan. Value goals: LDL < 70mg16mL > 40 mg.             Education:Diabetes - Individual verbal and written instruction to review signs/symptoms of diabetes, desired ranges of glucose level  fasting, after meals and with exercise. Acknowledge that pre and post exercise glucose checks will be done for 3 sessions at entry of program. Flowsheet [facility]  Date 09/18/21  Educator NT  Instruction Review Code 1- Verbalizes Understanding       Know Your Numbers and Heart Failure: - Group verbal and visual instruction to discuss disease risk factors for cardiac and pulmonary disease and treatment options.  Reviews associated critical values for Overweight/Obesity, Hypertension, Cholesterol, and Diabetes.  Discusses basics of heart failure: signs/symptoms and treatments.  Introduces Heart Failure Zone chart for action plan for heart failure.  Written material given at graduation. Flowsheet Row [facility]  Date 11/13/21  Educator SB  Instruction Review Code 1- Verbalizes Understanding       Core Components/Risk Factors/Patient Goals Review:   Goals and Risk Factor Review     Row Name 10/28/21 1023 11/20/21 1016 12/11/21 1009         Core Components/Risk Factors/Patient Goals Review   Personal Goals Review Weight Management/Obesity;Hypertension;Lipids;Diabetes Weight Management/Obesity;Hypertension;Lipids;Diabetes  Weight Management/Obesity;Hypertension;Lipids;Diabetes;Increase knowledge of respiratory medications and ability to use respiratory devices properly.;Improve shortness of breath with ADL's     Review Kayla Wu states that she would like to lose some weight. She has a short term goal of losing 10 lbs and a long term goal of 25 lbs. She states that she has not been checking her BP at home but was encouraged to do so especially with exercise. Mayci does check her blood sugars at home and states that they are within normal ranges. Shaundrea states that she would like to lose some weight. She has a short term goal of losing 10 lbs and a long term goal of 25 lbs. She  states that she has not been checking her BP at home but was encouraged to do so especially with exercise. Kayla Wu does check her blood sugars at home and states that they have been a little high but are overall within normal ranges. Kayla Wu is doing wel in rehab.  Her weight is staying the same.  She would like to lose more, but does not stick to her diet all the time.  Her sugars and pressures are doing well.  her breathing is getting better.  She only notes SOB when she walks.  She is working with her doctor to get a small tank to use for walking.  She is doing well on her meds.     Expected Outcomes Short: begin to checl BP at home. Long: Continue to monitor lifestyle risk factors. Short: begin to check BP at home. Long: Continue to monitor lifestyle risk factors. Short: Contineu to work on weight loss Long: Continue to monitor risk factors              Core Components/Risk Factors/Patient Goals at Discharge (Final Review):   Goals and Risk Factor Review - 12/11/21 1009       Core Components/Risk Factors/Patient Goals Review   Personal Goals Review Weight Management/Obesity;Hypertension;Lipids;Diabetes;Increase knowledge of respiratory medications and ability to use respiratory devices properly.;Improve shortness of breath with ADL's    Review Kayla Wu is doing wel in rehab.  Her weight is staying the same.  She would like to lose more, but does not stick to her diet all the time.  Her sugars and pressures are doing well.  her breathing is getting better.  She only notes SOB when she walks.  She is working with her doctor to get a small tank to use for walking.  She is doing well on her meds.    Expected Outcomes Short: Contineu to work on weight loss Long: Continue to monitor risk factors             ITP Comments:  ITP Comments     Row Name 09/16/21 1507 09/18/21 1448 10/09/21 1154 10/09/21 1332 11/06/21 0734   ITP Comments Virtual orientation call completed today. shehas an  appointment on Date: 09/20/2021  for EP eval and gym Orientation.  Documentation of diagnosis can be found in [facility] Date: 08/01/2021 . Completed 6MWT and gym orientation. Initial ITP created and sent for review to Dr. Ottie Glazier, Medical Director. First full day of exercise!  Patient was oriented to gym and equipment including functions, settings, policies, and procedures.  Patient's individual exercise prescription and treatment plan were reviewed.  All starting workloads were established based on the results of the 6 minute walk test done at initial orientation visit.  The plan for exercise progression was also introduced and progression will be customized based on patient's  performance and goals. 30 Day review completed. Medical Director ITP review done, changes made as directed, and signed approval by Medical Director.    NEW 30 Day review completed. Medical Director ITP review done, changes made as directed, and signed approval by Medical Director.    Row Name 11/12/21 1055 12/04/21 0951 01/01/22 0818       ITP Comments Completed initial RD consultation 30 Day review completed. Medical Director ITP review done, changes made as directed, and signed approval by Medical Director. 30 Day review completed. Medical Director ITP review done, changes made as directed, and signed approval by Medical Director.              Comments:

## 2022-01-01 NOTE — Progress Notes (Signed)
Daily Session Note  Patient Details  Name: Kayla Wu MRN: [id number] Date of Birth: 1945-08-21 Referring Provider:   Flowsheet Row Pulmonary Rehab from 09/18/2021 in [facility] Cardiac and Pulmonary Rehab  Referring Provider Lanney Gins       Encounter Date: 01/01/2022  Check In:  Session Check In - 01/01/22 1047       Check-In   Supervising physician immediately available to respond to emergencies See telemetry face sheet for immediately available ER MD    Location [facility]-Cardiac & Pulmonary Rehab    Staff Present Antionette Fairy, BS, Exercise Physiologist;Meredith Sherryll Burger, RN BSN;Jessica Luan Pulling, MA, RCEP, CCRP, Mindi Curling, RN, [place]    Virtual Visit No    Medication changes reported     No    Fall or balance concerns reported    No    Warm-up and Cool-down Performed on first and last piece of equipment    Resistance Training Performed Yes    VAD Patient? No    PAD/SET Patient? No      Pain Assessment   Currently in Pain? No/denies                Social History   Tobacco Use  Smoking Status Former   Packs/day: 2.00   Years: 42.00   Total pack years: 84.00   Types: Cigarettes   Quit date: 07/05/2007   Years since quitting: 14.5  Smokeless Tobacco Never    Goals Met:  Independence with exercise equipment Exercise tolerated well No report of concerns or symptoms today Strength training completed today  Goals Unmet:  Not Applicable  Comments: Pt able to follow exercise prescription today without complaint.  Will continue to monitor for progression.    Dr. Emily Filbert is Medical Director for [place].  Dr. Ottie Glazier is Medical Director for [facility] Pulmonary Rehabilitation.

## 2022-01-03 ENCOUNTER — Encounter: Payer: Medicare HMO | Attending: Pulmonary Disease | Admitting: *Deleted

## 2022-01-03 DIAGNOSIS — U099 Post covid-19 condition, unspecified: Secondary | ICD-10-CM | POA: Diagnosis not present

## 2022-01-03 DIAGNOSIS — J849 Interstitial pulmonary disease, unspecified: Secondary | ICD-10-CM | POA: Diagnosis not present

## 2022-01-03 DIAGNOSIS — R0600 Dyspnea, unspecified: Secondary | ICD-10-CM | POA: Diagnosis not present

## 2022-01-03 NOTE — Progress Notes (Signed)
Daily Session Note  Patient Details  Name: Kayla Wu MRN: [id number] Date of Birth: 1945-10-17 Referring Provider:   Flowsheet Row Pulmonary Rehab from 09/18/2021 in [facility] Cardiac and Pulmonary Rehab  Referring Provider Lanney Gins       Encounter Date: 01/03/2022  Check In:  Session Check In - 01/03/22 0937       Check-In   Supervising physician immediately available to respond to emergencies See telemetry face sheet for immediately available ER MD    Location [facility]-Cardiac & Pulmonary Rehab    Staff Present Alberteen Sam, MA, RCEP, CCRP, CCET;Marvell Fuller, PhD, RN, CNS, CEN;Joseph Tessie Fass, [place]    Virtual Visit No    Medication changes reported     No    Fall or balance concerns reported    No    Warm-up and Cool-down Performed on first and last piece of equipment    Resistance Training Performed Yes    VAD Patient? No    PAD/SET Patient? No      Pain Assessment   Currently in Pain? No/denies                Social History   Tobacco Use  Smoking Status Former   Packs/day: 2.00   Years: 42.00   Total pack years: 84.00   Types: Cigarettes   Quit date: 07/05/2007   Years since quitting: 14.5  Smokeless Tobacco Never    Goals Met:  Proper associated with RPD/PD & O2 Sat Independence with exercise equipment Using PLB without cueing & demonstrates good technique Exercise tolerated well No report of concerns or symptoms today Strength training completed today  Goals Unmet:  Not Applicable  Comments: Pt able to follow exercise prescription today without complaint.  Will continue to monitor for progression.    Dr. Emily Filbert is Medical Director for [name].  Dr. Ottie Glazier is Medical Director for [facility] Pulmonary Rehabilitation.

## 2022-01-06 ENCOUNTER — Encounter: Payer: Medicare HMO | Admitting: *Deleted

## 2022-01-06 DIAGNOSIS — Z794 Long term (current) use of insulin: Secondary | ICD-10-CM | POA: Diagnosis not present

## 2022-01-06 DIAGNOSIS — R809 Proteinuria, unspecified: Secondary | ICD-10-CM | POA: Diagnosis not present

## 2022-01-06 DIAGNOSIS — R0600 Dyspnea, unspecified: Secondary | ICD-10-CM

## 2022-01-06 DIAGNOSIS — U099 Post covid-19 condition, unspecified: Secondary | ICD-10-CM

## 2022-01-06 DIAGNOSIS — E1129 Type 2 diabetes mellitus with other diabetic kidney complication: Secondary | ICD-10-CM | POA: Diagnosis not present

## 2022-01-06 DIAGNOSIS — E1122 Type 2 diabetes mellitus with diabetic chronic kidney disease: Secondary | ICD-10-CM | POA: Diagnosis not present

## 2022-01-06 DIAGNOSIS — J849 Interstitial pulmonary disease, unspecified: Secondary | ICD-10-CM | POA: Diagnosis not present

## 2022-01-06 DIAGNOSIS — N1831 Chronic kidney disease, stage 3a: Secondary | ICD-10-CM | POA: Diagnosis not present

## 2022-01-06 DIAGNOSIS — E1159 Type 2 diabetes mellitus with other circulatory complications: Secondary | ICD-10-CM | POA: Diagnosis not present

## 2022-01-06 NOTE — Progress Notes (Signed)
Discharge Summary: Kayla Wu (DOB: [birth year])  Kayla Wu graduated today from  rehab with 36 sessions completed.  Details of the patient's exercise prescription and what She needs to do in order to continue the prescription and progress were discussed with patient.  Patient was given a copy of prescription and goals.  Patient verbalized understanding.  Nillie plans to continue to exercise by walking.   [place] Name 09/18/21 1510 12/23/21 1045       6 Minute Walk   Phase Initial Discharge    Distance 950 feet 1005 feet    Distance % Change -- 5.8 %    Distance Feet Change -- 55 ft    Walk Time 5.53 minutes 6 minutes    # of Rest Breaks 2 0    MPH 1.95 1.9    METS 1.55 1.54    RPE 13 13    Perceived Dyspnea  3 1    VO2 Peak 5.44 5.38    Symptoms Yes (comment) No    Comments SOB --    Resting HR 59 bpm 65 bpm    Resting BP 118/62 124/62    Resting Oxygen Saturation  94 % 92 %    Exercise Oxygen Saturation  during 6 min walk 88 % 87 %    Max Ex. HR 86 bpm 84 bpm    Max Ex. BP 144/72 132/64    2 Minute Post BP 132/68 122/82      Interval HR   1 Minute HR 84 76    2 Minute HR 86 79    3 Minute HR 86 81    4 Minute HR 85 83    5 Minute HR 86 84    6 Minute HR 86 83    2 Minute Post HR 64 70    Interval Heart Rate? Yes Yes      Interval Oxygen   Interval Oxygen? Yes Yes    Baseline Oxygen Saturation % 94 % 92 %    1 Minute Oxygen Saturation % 91 % 89 %    1 Minute Liters of Oxygen 0 L  RA 0 L    2 Minute Oxygen Saturation % 88 % 87 %    2 Minute Liters of Oxygen 0 L  RA 0 L    3 Minute Oxygen Saturation % 90 % 88 %    3 Minute Liters of Oxygen 0 L  RA 0 L    4 Minute Oxygen Saturation % 92 % 89 %    4 Minute Liters of Oxygen 0 L  RA 0 L    5 Minute Oxygen Saturation % 91 % 88 %    5 Minute Liters of Oxygen 0 L  RA 0 L    6 Minute Oxygen Saturation % 90 % 88 %    6 Minute Liters of Oxygen 0 L  RA 0 L    2 Minute Post Oxygen Saturation % 93 % 95 %    2  Minute Post Liters of Oxygen 0 L  RA 0 L

## 2022-01-06 NOTE — Patient Instructions (Signed)
Discharge Patient Instructions  Patient Details  Name: Kayla Wu MRN: [id number] Date of Birth: 1946-01-20 Referring Provider:  Idelle Crouch, MD   Number of Visits: 19  Reason for Discharge:  Patient reached a stable level of exercise. Patient independent in their exercise. Patient has met program and personal goals.  Diagnosis:  Post covid-19 condition, unspecified  Dyspnea, unspecified type  ILD (interstitial lung disease) ([place])  Initial Exercise Prescription:  Initial Exercise Prescription - 09/18/21 1500       Date of Initial Exercise RX and Referring Provider   Date 09/18/21    Referring Provider Aleskerov      Oxygen   Maintain Oxygen Saturation 88% or higher      Treadmill   MPH 1.5    Grade 0    Minutes 15    METs 2.15      NuStep   Level 1    SPM 80    Minutes 15    METs 1.55      T5 Nustep   Level 1    SPM 80    Minutes 15    METs 1.55      Biostep-RELP   Level 1    SPM 60    Minutes 15    METs 1.55      Track   Laps 15    Minutes 15    METs 1.82      Prescription Details   Frequency (times per week) 3    Duration Progress to 30 minutes of continuous aerobic without signs/symptoms of physical distress      Intensity   THRR 40-80% of Max Heartrate 93-127    Ratings of Perceived Exertion 11-15    Perceived Dyspnea 0-4      Progression   Progression Continue to progress workloads to maintain intensity without signs/symptoms of physical distress.      Resistance Training   Training Prescription Yes    Weight 3 lb    Reps 10-15             Discharge Exercise Prescription (Final Exercise Prescription Changes):  Exercise Prescription Changes - 12/30/21 1300       Response to Exercise   Blood Pressure (Admit) 124/62    Blood Pressure (Exit) 126/68    Heart Rate (Admit) 67 bpm    Heart Rate (Exercise) 84 bpm    Heart Rate (Exit) 65 bpm    Oxygen Saturation (Admit) 94 %    Oxygen Saturation (Exercise) 87 %     Oxygen Saturation (Exit) 96 %    Rating of Perceived Exertion (Exercise) 13    Perceived Dyspnea (Exercise) 1    Symptoms SOB    Duration Continue with 30 min of aerobic exercise without signs/symptoms of physical distress.    Intensity THRR unchanged      Progression   Progression Continue to progress workloads to maintain intensity without signs/symptoms of physical distress.    Average METs 2.75      Resistance Training   Training Prescription Yes    Weight 2 lb    Reps 10-15      Interval Training   Interval Training No      Recumbant Bike   Level 3    Minutes 15    METs 2.6      NuStep   Level 4    Minutes 15    METs 3.5      Home Exercise Plan   Plans to continue exercise  at Home (comment)   walking   Frequency Add 2 additional days to program exercise sessions.    Initial Home Exercises Provided 12/11/21      Oxygen   Maintain Oxygen Saturation 88% or higher             Functional Capacity:  6 Minute Walk     Row Name 09/18/21 1510 12/23/21 1045       6 Minute Walk   Phase Initial Discharge    Distance 950 feet 1005 feet    Distance % Change -- 5.8 %    Distance Feet Change -- 55 ft    Walk Time 5.53 minutes 6 minutes    # of Rest Breaks 2 0    MPH 1.95 1.9    METS 1.55 1.54    RPE 13 13    Perceived Dyspnea  3 1    VO2 Peak 5.44 5.38    Symptoms Yes (comment) No    Comments SOB --    Resting HR 59 bpm 65 bpm    Resting BP 118/62 124/62    Resting Oxygen Saturation  94 % 92 %    Exercise Oxygen Saturation  during 6 min walk 88 % 87 %    Max Ex. HR 86 bpm 84 bpm    Max Ex. BP 144/72 132/64    2 Minute Post BP 132/68 122/82      Interval HR   1 Minute HR 84 76    2 Minute HR 86 79    3 Minute HR 86 81    4 Minute HR 85 83    5 Minute HR 86 84    6 Minute HR 86 83    2 Minute Post HR 64 70    Interval Heart Rate? Yes Yes      Interval Oxygen   Interval Oxygen? Yes Yes    Baseline Oxygen Saturation % 94 % 92 %    1 Minute Oxygen  Saturation % 91 % 89 %    1 Minute Liters of Oxygen 0 L  RA 0 L    2 Minute Oxygen Saturation % 88 % 87 %    2 Minute Liters of Oxygen 0 L  RA 0 L    3 Minute Oxygen Saturation % 90 % 88 %    3 Minute Liters of Oxygen 0 L  RA 0 L    4 Minute Oxygen Saturation % 92 % 89 %    4 Minute Liters of Oxygen 0 L  RA 0 L    5 Minute Oxygen Saturation % 91 % 88 %    5 Minute Liters of Oxygen 0 L  RA 0 L    6 Minute Oxygen Saturation % 90 % 88 %    6 Minute Liters of Oxygen 0 L  RA 0 L    2 Minute Post Oxygen Saturation % 93 % 95 %    2 Minute Post Liters of Oxygen 0 L  RA 0 L             Nutrition & Weight - Outcomes:  Pre Biometrics - 09/18/21 1528       Pre Biometrics   Height 5' 4.5" (1.638 m)    Weight 196 lb 6.4 oz (89.1 kg)    Waist Circumference 41.5 inches    Hip Circumference 45.5 inches    Waist to Hip Ratio 0.91 %    BMI (Calculated) 33.2  Single Leg Stand 2.71 seconds             Post Biometrics - 12/23/21 1049        Post  Biometrics   Height 5' 4.5" (1.638 m)    Weight 198 lb 4.8 oz (89.9 kg)    Waist Circumference 41.5 inches    Hip Circumference 47 inches    Waist to Hip Ratio 0.88 %    BMI (Calculated) 33.52    Single Leg Stand 3 seconds             Nutrition:  Nutrition Therapy & Goals - 11/12/21 1020       Nutrition Therapy   Diet Heart healthy, low Na, T2DM MNT    Drug/Food Interactions Statins/Certain Fruits    Protein (specify units) 85-90g    Fiber 23 grams    Whole Grain Foods 3 servings    Saturated Fats 13 max. grams    Fruits and Vegetables 8 servings/day    Sodium 2 grams      Personal Nutrition Goals   Nutrition Goal ST: add in heart healthy snacks, add fiber, healthy fat, and protein to smaller portions of dessert foods LT: limit saturated fat <13g/day, limit added sugar <24g/day, maintain BG <7, eat at least 23g of fiber per day, limit salt <2g/day    Comments 76 y.o. F admitted to pulmonary rehab for post covid 19  condition, unspecified. PMHx includes HTN, COPD, GERD, T2DM, HLD, Ovarian cancer (diagnosis 2012) with recurrences in 2017 and 2021, DDD, chronic anemia. PSHx includes cholecystecomy, bowel obstruction. Relevant medications includes lipitor, ferrous sulfate, lorazepam, magnesium, omeprazole, B-12, humulin, metformin, trulicity.  Last A1C was 6.1. B: scrambled eggs with toast and coffee L: PB sandwich D: grill or pressure cooker: chicken, steak, or pork chops with potato, red meat 1x/week. Soups and beans like pinto beans a couple times per month. They go out to eat 1-2x/week. They use a small amount of oil (canola), whole grain bread, and she will use salt (she is not sure how much). Drinks: water with 1 glass of sweet tea for dinner. Malaiya reports her blood sugar will go up and down; lows of 50-65 and highers of 150. She will have sweets once per day like cake, cookies, ice cream. Discussed general heart healthy eating as well as T2DM MNT. Suggested lowering her portion sizes of sweet desserts and adding extra protien, healthy fat, and fiber; greek yogurt and berries with cake, peanutbutter with cookie, and nuts/seeds like walnuts to ice cream as this may help with her BG control without removing these items completely. Also recommended adding in heart healthy snacks with fiber, healthy fat, and protein to her day - fruit with nuts/seeds, soup with beans and some crackers, 1/2 sandwich, bean salad for example as this can control her BG to prevent BG lows as well as sneak in some extra nutritionally dense foods.      Intervention Plan   Intervention Prescribe, educate and counsel regarding individualized specific dietary modifications aiming towards targeted core components such as weight, hypertension, lipid management, diabetes, heart failure and other comorbidities.    Expected Outcomes Short Term Goal: Understand basic principles of dietary content, such as calories, fat, sodium, cholesterol and  nutrients.;Short Term Goal: A plan has been developed with personal nutrition goals set during dietitian appointment.;Long Term Goal: Adherence to prescribed nutrition plan.

## 2022-01-06 NOTE — Progress Notes (Signed)
Daily Session Note  Patient Details  Name: Kayla Wu MRN: [id number] Date of Birth: 08-May-1945 Referring Provider:   Flowsheet Row Pulmonary Rehab from 09/18/2021 in [facility]  Referring Provider Lanney Gins       Encounter Date: 01/06/2022  Check In:  Session Check In - 01/06/22 1129       Check-In   Supervising physician immediately available to respond to emergencies See telemetry face sheet for immediately available ER MD    Location [facility]-Cardiac & Pulmonary Rehab    Staff Present Earlean Shawl, BS, ACSM CEP, Exercise Physiologist;Meredith Sherryll Burger, RN Odelia Gage, RN, Dimple Nanas, BS, Exercise Physiologist    Virtual Visit No    Medication changes reported     No    Fall or balance concerns reported    No    Warm-up and Cool-down Performed on first and last piece of equipment    Resistance Training Performed Yes    VAD Patient? No    PAD/SET Patient? No      Pain Assessment   Currently in Pain? No/denies                Social History   Tobacco Use  Smoking Status Former   Packs/day: 2.00   Years: 42.00   Total pack years: 84.00   Types: Cigarettes   Quit date: 07/05/2007   Years since quitting: 14.5  Smokeless Tobacco Never    Goals Met:  Independence with exercise equipment Exercise tolerated well No report of concerns or symptoms today Strength training completed today  Goals Unmet:  Not Applicable  Comments:  Kayla Wu graduated today from  rehab with 36 sessions completed.  Details of the patient's exercise prescription and what She needs to do in order to continue the prescription and progress were discussed with patient.  Patient was given a copy of prescription and goals.  Patient verbalized understanding.  Kayla Wu plans to continue to exercise by walking.    Dr. Emily Filbert is Medical Director for [place].  Dr. Ottie Glazier is Medical Director for [facility] Pulmonary  Re[facility].

## 2022-01-06 NOTE — Progress Notes (Signed)
Pulmonary Individual Treatment Plan  Patient Details  Name: Kayla Wu MRN: [id number] Date of Birth: Jul 01, 1945 Referring Provider:   Flowsheet Row Pulmonary Rehab from 09/18/2021 in [facility] Cardiac and Pulmonary Rehab  Referring Provider Lanney Gins       Initial Encounter Date:  Flowsheet Row Pulmonary Rehab from 09/18/2021 in [facility] Cardiac and Pulmonary Rehab  Date 09/18/21       Visit Diagnosis: Post covid-19 condition, unspecified  Dyspnea, unspecified type  ILD (interstitial lung disease) ([place])  Patient's Home Medications on Admission:  Current Outpatient Medications:    albuterol (VENTOLIN HFA) 108 (90 Base) MCG/ACT inhaler, Inhale into the lungs., Disp: , Rfl:    aspirin EC 81 MG tablet, Take by mouth., Disp: , Rfl:    atorvastatin (LIPITOR) 20 MG tablet, Take 1 tablet by mouth daily., Disp: , Rfl:    bisoprolol (ZEBETA) 5 MG tablet, Take 1 tablet (5 mg total) by mouth daily., Disp: 90 tablet, Rfl: 3   diltiazem (CARDIZEM CD) 180 MG 24 hr capsule, Take 1 capsule (180 mg total) by mouth daily., Disp: 90 capsule, Rfl: 2   diphenhydrAMINE (BENADRYL) 50 MG capsule, Take 1 capsule 1 hour prior to procedure, Disp: 1 capsule, Rfl: 0   ferrous sulfate 325 (65 FE) MG tablet, Take 325 mg by mouth daily with breakfast., Disp: , Rfl:    Fluticasone-Umeclidin-Vilant (TRELEGY ELLIPTA) 100-62.5-25 MCG/ACT AEPB, Inhale into the lungs., Disp: , Rfl:    gabapentin (NEURONTIN) 600 MG tablet, Take 600 mg by mouth at bedtime., Disp: , Rfl:    hydrOXYzine (ATARAX) 50 MG tablet, , Disp: , Rfl:    insulin NPH-regular Human (70-30) 100 UNIT/ML injection, Inject into the skin as needed. (Patient not taking: Reported on 09/03/2021), Disp: , Rfl:    LORazepam (ATIVAN) 1 MG tablet, Take 1 mg by mouth 2 (two) times daily as needed for anxiety or sleep., Disp: , Rfl:    losartan (COZAAR) 100 MG tablet, Take 100 mg by mouth daily., Disp: , Rfl:    magnesium oxide (MAG-OX) 400 MG tablet, Take 400 mg by  mouth 2 (two) times daily., Disp: , Rfl:    metFORMIN (GLUCOPHAGE-XR) 500 MG 24 hr tablet, Take 1,500 mg by mouth daily with supper., Disp: , Rfl:    metoprolol tartrate (LOPRESSOR) 25 MG tablet, Take 1 tablet (25 mg total) by mouth once for 1 dose. Take 2 hours prior to your CT., Disp: 1 tablet, Rfl: 0   omeprazole (PRILOSEC) 40 MG capsule, Take 1 capsule by mouth daily., Disp: , Rfl:    predniSONE (DELTASONE) 50 MG tablet, Take 50 mg 13 hours, 7 hour, and 1 hour prior to procedure, Disp: 3 tablet, Rfl: 0   TRULICITY 1.5 HY/0.7PX SOPN, Inject 1.5 mg into the skin once a week., Disp: , Rfl:    venlafaxine XR (EFFEXOR-XR) 150 MG 24 hr capsule, Take 150 mg by mouth daily. (take with 37.67m capsule to equal 187.524mtotal), Disp: , Rfl:    venlafaxine XR (EFFEXOR-XR) 37.5 MG 24 hr capsule, Take 37.5 mg by mouth daily. (take with 15033mapsule to equal 187.5mg80mtal), Disp: , Rfl:    vitamin B-12 (CYANOCOBALAMIN) 1000 MCG tablet, Take 1,000 mcg by mouth daily., Disp: , Rfl:   Past Medical History: Past Medical History:  Diagnosis Date   Atrial fibrillation with RVR (HCC)[place]/27/2022   Chronic urticaria    COPD (chronic obstructive pulmonary disease) (HCC)[place] COVID-19 virus infection 06/2020   DDD (degenerative disc disease)  Diabetes mellitus ([place])    Diastolic dysfunction    a. 01/2018 Echo: Nl EF. Triv AI/MR/PR. Mild TR; b. 06/2020 Echo: EF 60-65%, no rwma, gr1 DD. Nl RV size/fxn. RVSP 14.19mHg.   GERD (gastroesophageal reflux disease)    Hepatic steatosis    Hypertension    Menopausal state    Morbid obesity ([place]    Non-cardiac chest pain    a. 07/2014 MV: EF 75%, no ischemia/artifact.   [place]varian cancer ([place] 2012   s/p chemo    PAF (paroxysmal atrial fibrillation) ([place]    a. Dx 06/2020 in setting of COVID infection; b. CHA2DS2VASc = 5-->eliquis.   Personal history of chemotherapy 2012   ovarian   Pneumonia 1987   Pulmonary embolus ([place]    Rectovaginal fistula     Tobacco  Use: Social History   Tobacco Use  Smoking Status Former   Packs/day: 2.00   Years: 42.00   Total pack years: 84.00   Types: Cigarettes   Quit date: 07/05/2007   Years since quitting: 14.5  Smokeless Tobacco Never    Labs: Review Flowsheet       Latest Ref Rng & Units 01/19/2020 06/25/2020 06/26/2020  Labs for ITP Cardiac and Pulmonary Rehab  Trlycerides <150 mg/dL - 110  -  Hemoglobin A1c 4.8 - 5.6 % 6.8  - 7.4      Pulmonary Assessment Scores:  Pulmonary Assessment Scores     Row Name 09/18/21 1537 12/25/21 1046       ADL UCSD   ADL Phase Entry Exit    SOB Score total 76 47    Rest 1 1    Walk 2 1    Stairs 5 4    Bath 2 1    Dress 1 2    Shop 5 3      CAT Score   CAT Score 21 14      mMRC Score   mMRC Score 2 --             UCSD: Self-administered rating of dyspnea associated with activities of daily living (ADLs) 6-point scale (0 = "not at all" to 5 = "maximal or unable to do because of breathlessness")  Scoring Scores range from 0 to 120.  Minimally important difference is 5 units  CAT: CAT can identify the health impairment of COPD patients and is better correlated with disease progression.  CAT has a scoring range of zero to 40. The CAT score is classified into four groups of low (less than 10), medium (10 - 20), high (21-30) and very high (31-40) based on the impact level of disease on health status. A CAT score over 10 suggests significant symptoms.  A worsening CAT score could be explained by an exacerbation, poor medication adherence, poor inhaler technique, or progression of COPD or comorbid conditions.  CAT MCID is 2 points  mMRC: mMRC (Modified Medical Research Council) Dyspnea Scale is used to assess the degree of baseline functional disability in patients of respiratory disease due to dyspnea. No minimal important difference is established. A decrease in score of 1 point or greater is considered a positive change.   Pulmonary Function  Assessment:   Exercise Target Goals: Exercise Program Goal: Individual exercise prescription set using results from initial 6 min walk test and THRR while considering  patient's activity barriers and safety.   Exercise Prescription Goal: Initial exercise prescription builds to 30-45 minutes a day of aerobic activity, 2-3 days per week.  Home exercise guidelines  will be given to patient during program as part of exercise prescription that the participant will acknowledge.  Education: Aerobic Exercise: - Group verbal and visual presentation on the components of exercise prescription. Introduces F.I.T.T principle from ACSM for exercise prescriptions.  Reviews F.I.T.T. principles of aerobic exercise including progression. Written material given at graduation. Flowsheet Row Pulmonary Rehab from 11/27/2021 in [facility] Cardiac and Pulmonary Rehab  Education need identified 09/18/21       Education: Resistance Exercise: - Group verbal and visual presentation on the components of exercise prescription. Introduces F.I.T.T principle from ACSM for exercise prescriptions  Reviews F.I.T.T. principles of resistance exercise including progression. Written material given at graduation.    Education: Exercise & Equipment Safety: - Individual verbal instruction and demonstration of equipment use and safety with use of the equipment. Flowsheet Row Pulmonary Rehab from 11/27/2021 in [facility] Cardiac and Pulmonary Rehab  Date 09/18/21  Educator NT  Instruction Review Code 1- Verbalizes Understanding       Education: Exercise Physiology & General Exercise Guidelines: - Group verbal and written instruction with models to review the exercise physiology of the cardiovascular system and associated critical values. Provides general exercise guidelines with specific guidelines to those with heart or lung disease.  Flowsheet Row Pulmonary Rehab from 11/27/2021 in [facility] Cardiac and Pulmonary Rehab  Education need  identified 09/18/21       Education: Flexibility, Balance, Mind/Body Relaxation: - Group verbal and visual presentation with interactive activity on the components of exercise prescription. Introduces F.I.T.T principle from ACSM for exercise prescriptions. Reviews F.I.T.T. principles of flexibility and balance exercise training including progression. Also discusses the mind body connection.  Reviews various relaxation techniques to help reduce and manage stress (i.e. Deep breathing, progressive muscle relaxation, and visualization). Balance handout provided to take home. Written material given at graduation.   Activity Barriers & Risk Stratification:  Activity Barriers & Cardiac Risk Stratification - 09/18/21 1516       Activity Barriers & Cardiac Risk Stratification   Activity Barriers Shortness of Breath             6 Minute Walk:  6 Minute Walk     Row Name 09/18/21 1510 12/23/21 1045       6 Minute Walk   Phase Initial Discharge    Distance 950 feet 1005 feet    Distance % Change -- 5.8 %    Distance Feet Change -- 55 ft    Walk Time 5.53 minutes 6 minutes    # of Rest Breaks 2 0    MPH 1.95 1.9    METS 1.55 1.54    RPE 13 13    Perceived Dyspnea  3 1    VO2 Peak 5.44 5.38    Symptoms Yes (comment) No    Comments SOB --    Resting HR 59 bpm 65 bpm    Resting BP 118/62 124/62    Resting Oxygen Saturation  94 % 92 %    Exercise Oxygen Saturation  during 6 min walk 88 % 87 %    Max Ex. HR 86 bpm 84 bpm    Max Ex. BP 144/72 132/64    2 Minute Post BP 132/68 122/82      Interval HR   1 Minute HR 84 76    2 Minute HR 86 79    3 Minute HR 86 81    4 Minute HR 85 83    5 Minute HR 86 84    6  Minute HR 86 83    2 Minute Post HR 64 70    Interval Heart Rate? Yes Yes      Interval Oxygen   Interval Oxygen? Yes Yes    Baseline Oxygen Saturation % 94 % 92 %    1 Minute Oxygen Saturation % 91 % 89 %    1 Minute Liters of Oxygen 0 L  RA 0 L    2 Minute Oxygen  Saturation % 88 % 87 %    2 Minute Liters of Oxygen 0 L  RA 0 L    3 Minute Oxygen Saturation % 90 % 88 %    3 Minute Liters of Oxygen 0 L  RA 0 L    4 Minute Oxygen Saturation % 92 % 89 %    4 Minute Liters of Oxygen 0 L  RA 0 L    5 Minute Oxygen Saturation % 91 % 88 %    5 Minute Liters of Oxygen 0 L  RA 0 L    6 Minute Oxygen Saturation % 90 % 88 %    6 Minute Liters of Oxygen 0 L  RA 0 L    2 Minute Post Oxygen Saturation % 93 % 95 %    2 Minute Post Liters of Oxygen 0 L  RA 0 L            Oxygen Initial Assessment:  Oxygen Initial Assessment - 09/16/21 1422       Home Oxygen   Home Oxygen Device None    Sleep Oxygen Prescription None    Home Exercise Oxygen Prescription None    Home Resting Oxygen Prescription None      Initial 6 min Walk   Oxygen Used None      Program Oxygen Prescription   Program Oxygen Prescription None      Intervention   Short Term Goals To learn and demonstrate proper pursed lip breathing techniques or other breathing techniques. ;To learn and demonstrate proper use of respiratory medications;To learn and understand importance of maintaining oxygen saturations>88%    Long  Term Goals Verbalizes importance of monitoring SPO2 with pulse oximeter and return demonstration;Maintenance of O2 saturations>88%;Exhibits proper breathing techniques, such as pursed lip breathing or other method taught during program session;Demonstrates proper use of MDI's;Compliance with respiratory medication             Oxygen Re-Evaluation:  Oxygen Re-Evaluation     Row Name 10/09/21 1154 10/28/21 1027 11/20/21 1017 12/11/21 1011 01/06/22 1020     Program Oxygen Prescription   Program Oxygen Prescription _0      Home Oxygen   Home Oxygen Device _1    Sleep Oxygen Prescription _2    Home Exercise Oxygen Prescription _3    Home Resting Oxygen Prescription None None None None  None     Goals/Expected Outcomes   Short Term Goals To learn and demonstrate proper pursed lip breathing techniques or other breathing techniques.  To learn and demonstrate proper pursed lip breathing techniques or other breathing techniques.  To learn and demonstrate proper pursed lip breathing techniques or other breathing techniques.  To learn and demonstrate proper pursed lip breathing techniques or other breathing techniques. ;To learn and understand importance of monitoring SPO2 with pulse oximeter and demonstrate accurate use of the pulse oximeter.;To learn and understand importance of maintaining oxygen saturations>88%;To learn and demonstrate proper use of respiratory medications  To learn and demonstrate proper pursed lip breathing techniques or other breathing techniques. ;To learn and understand importance of monitoring SPO2 with pulse oximeter and demonstrate accurate use of the pulse oximeter.;To learn and understand importance of maintaining oxygen saturations>88%;To learn and demonstrate proper use of respiratory medications   Long  Term Goals Exhibits proper breathing techniques, such as pursed lip breathing or other method taught during program session Exhibits proper breathing techniques, such as pursed lip breathing or other method taught during program session Exhibits proper breathing techniques, such as pursed lip breathing or other method taught during program session Verbalizes importance of monitoring SPO2 with pulse oximeter and return demonstration;Maintenance of O2 saturations>88%;Exhibits proper breathing techniques, such as pursed lip breathing or other method taught during program session;Compliance with respiratory medication Verbalizes importance of monitoring SPO2 with pulse oximeter and return demonstration;Maintenance of O2 saturations>88%;Exhibits proper breathing techniques, such as pursed lip breathing or other method taught during program session;Compliance with  respiratory medication   Comments Reviewed PLB technique with pt.  Talked about how it works and it's importance in maintaining their exercise saturations. We reviewed PLB technique and she was encouraged to practice. She states that she used to be on oxygen but her saturations were staying around 94% so she was able to come off. She does have a pulse-ox at home and states that her oxygen saturations have stayed up at home. We reviewed PLB technique and she was encouraged to practice. She states that she does forget about PLB at times but has been working on it. She states that she used to be on oxygen but her saturations were staying around 94% so she was able to come off. She does have a pulse-ox at home and states that her oxygen saturations have stayed up at home. Kayla Wu is doing well in rehab. She is using her PLB and finds it helpful for walking.  She is working with her doctor to try to get a 1L tank to use for walking.  She keeps an eye on her saturations and they are usually pretty good. Upon graduation Kayla Wu will continue to use her PLB. She will continue to monitor her 02 satruations at home. She is waiting for her doctor to correct her o2 perscription and then plans to get her 1 L tank to have for walking.   Goals/Expected Outcomes Short: Become more profiecient at using PLB.   Long: Become independent at using PLB. Short: Become more profiecient at using PLB.   Long: Become independent at using PLB. Short: Become more profiecient at using PLB.   Long: Become independent at using PLB. Short: Get small tank for walking Long: Continue use PLB for SOB Upon graduation Kayla Wu will continue to exercies and use PLB techniques to help control SOB.            Oxygen Discharge (Final Oxygen Re-Evaluation):  Oxygen Re-Evaluation - 01/06/22 1020       Program Oxygen Prescription   Program Oxygen Prescription None      Home Oxygen   Home Oxygen Device None    Sleep Oxygen Prescription None     Home Exercise Oxygen Prescription None    Home Resting Oxygen Prescription None      Goals/Expected Outcomes   Short Term Goals To learn and demonstrate proper pursed lip breathing techniques or other breathing techniques. ;To learn and understand importance of monitoring SPO2 with pulse oximeter and demonstrate accurate use of the pulse oximeter.;To learn and understand importance of maintaining oxygen  saturations>88%;To learn and demonstrate proper use of respiratory medications    Long  Term Goals Verbalizes importance of monitoring SPO2 with pulse oximeter and return demonstration;Maintenance of O2 saturations>88%;Exhibits proper breathing techniques, such as pursed lip breathing or other method taught during program session;Compliance with respiratory medication    Comments Upon graduation Kayla Wu will continue to use her PLB. She will continue to monitor her 02 satruations at home. She is waiting for her doctor to correct her o2 perscription and then plans to get her 1 L tank to have for walking.    Goals/Expected Outcomes Upon graduation Kayla Wu will continue to exercies and use PLB techniques to help control SOB.             Initial Exercise Prescription:  Initial Exercise Prescription - 09/18/21 1500       Date of Initial Exercise RX and Referring Provider   Date 09/18/21    Referring Provider Aleskerov      Oxygen   Maintain Oxygen Saturation 88% or higher      Treadmill   MPH 1.5    Grade 0    Minutes 15    METs 2.15      NuStep   Level 1    SPM 80    Minutes 15    METs 1.55      T5 Nustep   Level 1    SPM 80    Minutes 15    METs 1.55      Biostep-RELP   Level 1    SPM 60    Minutes 15    METs 1.55      Track   Laps 15    Minutes 15    METs 1.82      Prescription Details   Frequency (times per week) 3    Duration Progress to 30 minutes of continuous aerobic without signs/symptoms of physical distress      Intensity   THRR 40-80% of Max Heartrate  93-127    Ratings of Perceived Exertion 11-15    Perceived Dyspnea 0-4      Progression   Progression Continue to progress workloads to maintain intensity without signs/symptoms of physical distress.      Resistance Training   Training Prescription Yes    Weight 3 lb    Reps 10-15             Perform Capillary Blood Glucose checks as needed.  Exercise Prescription Changes:   Exercise Prescription Changes     Row Name 09/18/21 1500 10/22/21 0900 11/04/21 1300 11/18/21 1500 12/02/21 1300     Response to Exercise   Blood Pressure (Admit) 118/62 110/62 122/70 132/74 138/72   Blood Pressure (Exercise) 144/72 124/74 152/84 -- --   Blood Pressure (Exit) 120/64 110/68 122/64 118/60 122/60   Heart Rate (Admit) 59 bpm 59 bpm 68 bpm 65 bpm 62 bpm   Heart Rate (Exercise) 86 bpm 83 bpm 86 bpm 86 bpm 86 bpm   Heart Rate (Exit) 63 bpm 71 bpm 63 bpm 72 bpm 65 bpm   Oxygen Saturation (Admit) 94 % 92 % 92 % 93 % 93 %   Oxygen Saturation (Exercise) 88 % 92 % 88 % 88 % 88 %   Oxygen Saturation (Exit) 91 % 92 % 90 % 90 % 97 %   Rating of Perceived Exertion (Exercise) _0 Perceived Dyspnea (Exercise) _1 Symptoms _2   Comments Completed 6MWT -- -- -- --   Duration Progress to 30 minutes of  aerobic without signs/symptoms of physical distress Progress to 30 minutes of  aerobic without signs/symptoms of physical distress Progress to 30 minutes of  aerobic without signs/symptoms of physical distress Continue with 30 min of aerobic exercise without signs/symptoms of physical distress. Continue with 30 min of aerobic exercise without signs/symptoms of physical distress.   Intensity _0      Progression   Progression -- Continue to progress workloads to maintain intensity without signs/symptoms of physical distress. Continue to progress workloads to maintain intensity without signs/symptoms of  physical distress. Continue to progress workloads to maintain intensity without signs/symptoms of physical distress. Continue to progress workloads to maintain intensity without signs/symptoms of physical distress.   Average METs -- 2.46 2.34 2.61 2.97     Resistance Training   Training Prescription -- Yes Yes Yes Yes   Weight -- 3 lb 3 lb 3 lb 3 lb   Reps -- 10-15 10-15 10-15 10-15     Interval Training   Interval Training -- No No No No     Treadmill   MPH -- 1.5 1.8 -- --   Grade -- 0 0 -- --   Minutes -- 15 15 -- --   METs -- 2.15 2.38 -- --     Recumbant Bike   Level -- -- -- 2 2   RPM -- -- -- 50 --   Watts -- -- -- 18 19   Minutes -- -- -- 15 15   METs -- -- -- 2.63 3.1     NuStep   Level -- 2 -- 3 5   Minutes -- 15 -- 15 15   METs -- 2.6 -- 2.6 3.5     T5 Nustep   Level -- 3 -- 3 2   Minutes -- 15 -- 15 15   METs -- -- -- 2 1.9     Biostep-RELP   Level -- -- 2 2 --   Minutes -- -- 15 15 --   METs -- -- 3 2 --     Track   Laps -- _1 Minutes -- _2 METs -- 2.63 2.63 2.63 2.63     Oxygen   Maintain Oxygen Saturation -- 88% or higher 88% or higher 88% or higher 88% or higher    Row Name 12/11/21 1000 12/16/21 1400 12/30/21 1300         Response to Exercise   Blood Pressure (Admit) -- 118/60 124/62     Blood Pressure (Exit) -- 110/62 126/68     Heart Rate (Admit) -- 66 bpm 67 bpm     Heart Rate (Exercise) -- 82 bpm 84 bpm     Heart Rate (Exit) -- 63 bpm 65 bpm     Oxygen Saturation (Admit) -- 93 % 94 %     Oxygen Saturation (Exercise) -- 88 % 87 %     Oxygen Saturation (Exit) -- 93 % 96 %     Rating of Perceived Exertion (Exercise) -- 15 13     Perceived Dyspnea (Exercise) -- 3 1     Symptoms -- SOB SOB     Duration -- Continue with 30 min of aerobic exercise without signs/symptoms of physical distress. Continue with 30 min of aerobic exercise without signs/symptoms of physical distress.     Intensity --  THRR unchanged THRR  unchanged       Progression   Progression -- Continue to progress workloads to maintain intensity without signs/symptoms of physical distress. Continue to progress workloads to maintain intensity without signs/symptoms of physical distress.     Average METs -- 2.76 2.75       Resistance Training   Training Prescription -- Yes Yes     Weight -- 3 lb 2 lb     Reps -- 10-15 10-15       Interval Training   Interval Training -- No No       Recumbant Bike   Level -- 1 3     Minutes -- 15 15     METs -- 2.66 2.6       NuStep   Level -- 5 4     Minutes -- 15 15     METs -- 3.6 3.5       Biostep-RELP   Level -- 2 --     Minutes -- 15 --     METs -- 2 --       Track   Laps -- 27 --     Minutes -- 15 --     METs -- 2.47 --       Home Exercise Plan   Plans to continue exercise at Home (comment)  walking Home (comment)  walking Home (comment)  walking     Frequency Add 2 additional days to program exercise sessions. Add 2 additional days to program exercise sessions. Add 2 additional days to program exercise sessions.     Initial Home Exercises Provided 12/11/21 12/11/21 12/11/21       Oxygen   Maintain Oxygen Saturation 88% or higher 88% or higher 88% or higher              Exercise Comments:   Exercise Comments     Row Name 10/09/21 1154           Exercise Comments First full day of exercise!  Patient was oriented to gym and equipment including functions, settings, policies, and procedures.  Patient's individual exercise prescription and treatment plan were reviewed.  All starting workloads were established based on the results of the 6 minute walk test done at initial orientation visit.  The plan for exercise progression was also introduced and progression will be customized based on patient's performance and goals.                Exercise Goals and Review:   Exercise Goals     Row Name 09/18/21 1528             Exercise Goals   Increase Physical  Activity Yes       Intervention Provide advice, education, support and counseling about physical activity/exercise needs.;Develop an individualized exercise prescription for aerobic and resistive training based on initial evaluation findings, risk stratification, comorbidities and participant's personal goals.       Expected Outcomes Short Term: Attend rehab on a regular basis to increase amount of physical activity.;Long Term: Add in home exercise to make exercise part of routine and to increase amount of physical activity.;Long Term: Exercising regularly at least 3-5 days a week.       Increase Strength and Stamina Yes       Intervention Provide advice, education, support and counseling about physical activity/exercise needs.;Develop an individualized exercise prescription for aerobic and resistive training based on initial evaluation findings, risk stratification, comorbidities and participant's personal goals.  Expected Outcomes Short Term: Increase workloads from initial exercise prescription for resistance, speed, and METs.;Long Term: Improve cardiorespiratory fitness, muscular endurance and strength as measured by increased METs and functional capacity (6MWT);Short Term: Perform resistance training exercises routinely during rehab and add in resistance training at home       Able to understand and use rate of perceived exertion (RPE) scale Yes       Intervention Provide education and explanation on how to use RPE scale       Expected Outcomes Short Term: Able to use RPE daily in rehab to express subjective intensity level;Long Term:  Able to use RPE to guide intensity level when exercising independently       Able to understand and use Dyspnea scale Yes       Intervention Provide education and explanation on how to use Dyspnea scale       Expected Outcomes Short Term: Able to use Dyspnea scale daily in rehab to express subjective sense of shortness of breath during exertion;Long Term: Able to  use Dyspnea scale to guide intensity level when exercising independently       Knowledge and understanding of Target Heart Rate Range (THRR) Yes       Intervention Provide education and explanation of THRR including how the numbers were predicted and where they are located for reference       Expected Outcomes Short Term: Able to state/look up THRR;Long Term: Able to use THRR to govern intensity when exercising independently;Short Term: Able to use daily as guideline for intensity in rehab       Able to check pulse independently Yes       Intervention Review the importance of being able to check your own pulse for safety during independent exercise;Provide education and demonstration on how to check pulse in carotid and radial arteries.       Expected Outcomes Short Term: Able to explain why pulse checking is important during independent exercise;Long Term: Able to check pulse independently and accurately       Understanding of Exercise Prescription Yes       Intervention Provide education, explanation, and written materials on patient's individual exercise prescription       Expected Outcomes Short Term: Able to explain program exercise prescription;Long Term: Able to explain home exercise prescription to exercise independently                Exercise Goals Re-Evaluation :  Exercise Goals Re-Evaluation     Row Name 10/09/21 1155 10/22/21 0906 10/28/21 1012 11/04/21 1324 11/18/21 1550     Exercise Goal Re-Evaluation   Exercise Goals Review Able to understand and use rate of perceived exertion (RPE) scale;Knowledge and understanding of Target Heart Rate Range (THRR);Able to understand and use Dyspnea scale;Understanding of Exercise Prescription Increase Physical Activity;Increase Strength and Stamina;Understanding of Exercise Prescription Increase Physical Activity;Increase Strength and Stamina;Understanding of Exercise Prescription Increase Physical Activity;Increase Strength and  Stamina;Understanding of Exercise Prescription Increase Physical Activity;Increase Strength and Stamina;Understanding of Exercise Prescription   Comments Reviewed RPE and dyspnea scales, THR and program prescription with pt today.  Pt voiced understanding and was given a copy of goals to take home. Iveliz is doing well in rehab.  She is on level 3 on the T5 NuStep and walking 30 laps. We will continue to monitor her progress. Janelis is doing well in rehab. She feels that she is progressing well in the program. She states that she has definitely seen some improvement especially with  her endurance. She has progressed to be able to do the treadmill and not just the track. She has been walking at home on her days away from rehab. She states that she usually walks about 20 min at home. We will continue to monitor her progress. Fayth continues to do well in rehab. She has gotten up to 30 laps walking on the track. She has also increased her workload on the treadmill to a speed of 1.8 mph with no incline. Jenilee would benefit from adding in some incline on the treadmill as tolerated. We will continue to monitor her progress. Hampton continues to do well in rehab. She has increased to level 3 on the T4 Nustep. She has been consistently reaching 30 laps on the track and should be encouraged to increase that next sessions.  She is not quite hitting her THR but RPEs are appropriate. We will continue to monitor.   Expected Outcomes Short: Use RPE daily to regulate intensity. Long: Follow program prescription in THR. Short: Increase seated equipment workloads Long: conitnue to improve stamina Short: Continue to walk on days away from rehab. Long: Continue to increase strength and stamina. Short: Add incline on the treadmill. Long: Continue to increase strength and stamina. Short: Increase laps on track Long: Continue to increase overall MET level    Row Name 11/20/21 1004 12/02/21 1326 12/11/21 1005 12/11/21 1054  12/16/21 1436     Exercise Goal Re-Evaluation   Exercise Goals Review Increase Physical Activity;Increase Strength and Stamina;Understanding of Exercise Prescription Increase Physical Activity;Increase Strength and Stamina;Understanding of Exercise Prescription Increase Physical Activity;Increase Strength and Stamina;Understanding of Exercise Prescription Increase Physical Activity;Increase Strength and Stamina;Understanding of Exercise Prescription Increase Physical Activity;Increase Strength and Stamina;Understanding of Exercise Prescription   Comments Kayla Wu continues to do well in rehab. She states that she is able to walk better, breathe better, and perform ADLs better since starting the program. She has dealt with some hip pain that has bothered her during exercise at times. Kayla Wu has been walking at home on her days away from rehab for around 15 minutes. She states that walking has been the most difficult form of exercise for her. We will continue to monitor her progress. Kayla Wu is doing well in rehab. She recently improved her overall average MET level to 2.97 METs. She also improved to level 5 on the T4 machine. She also has consistently gotten 30 laps on the track as well. We will continue to monitor her progress in the program. Kayla Wu is doing well in rehab.  She is doing some walking at home on her off days.  She is feeling like her stamina is coming back, her strength not quite yet.  She has noticed that her breathing is getting better. Reviewed home exercise with pt today.  Pt plans to walk for exercise.  Reviewed THR, pulse, RPE, sign and symptoms, pulse oximetery and when to call 911 or MD.  Also discussed weather considerations and indoor options.  Pt voiced understanding. Xena continues to do well in rehab. Her level on the T4 Nustep has been consistent at level 5, however, she is still at level 1 on the recumbent bike and staff will encourage patient to increase her load on it next  time. She is due for her post 6MWT next week and hope to see improvement. Will continue to monitor.   Expected Outcomes Short: Continue to walk at home on days away from rehab. Long: Continue to increase strength and stamina. Short: Continue to increase  workloads, and push for more laps on the track. Long: Continue to increase strength and stamina. Short: Continue to add in more walking at home Long: Continue to improve stamina Short: Continue to monitor HR and O2 during exercise Long: Continue independent exercise at home at appropriate prescription Short: Improve on post 6MWT Long: Continue to increase overall MET level    Row Name 12/30/21 1329 01/06/22 1011           Exercise Goal Re-Evaluation   Exercise Goals Review Increase Physical Activity;Increase Strength and Stamina;Understanding of Exercise Prescription Increase Physical Activity;Increase Strength and Stamina;Understanding of Exercise Prescription      Comments Alonia is doing well in rehab. She recently improved on her post 6MWT by 55 ft. She also improved to level 3 on the recumbent bike. She has consistently worked at a MET level above 2.75 METs as well. We will continue to monitor her progress until she graduates from the program. Kayla Wu plans to join the [facility] upon graduation from [place] re[facility]. She has an orientation appointment today to join.      Expected Outcomes Short: Graduate. Long: Continue to increase strength and stamina. Cari Caraway will continue to maintain her exercise proram independently.               Discharge Exercise Prescription (Final Exercise Prescription Changes):  Exercise Prescription Changes - 12/30/21 1300       Response to Exercise   Blood Pressure (Admit) 124/62    Blood Pressure (Exit) 126/68    Heart Rate (Admit) 67 bpm    Heart Rate (Exercise) 84 bpm    Heart Rate (Exit) 65 bpm    Oxygen Saturation (Admit) 94 %    Oxygen Saturation (Exercise) 87 %    Oxygen Saturation  (Exit) 96 %    Rating of Perceived Exertion (Exercise) 13    Perceived Dyspnea (Exercise) 1    Symptoms SOB    Duration Continue with 30 min of aerobic exercise without signs/symptoms of physical distress.    Intensity THRR unchanged      Progression   Progression Continue to progress workloads to maintain intensity without signs/symptoms of physical distress.    Average METs 2.75      Resistance Training   Training Prescription Yes    Weight 2 lb    Reps 10-15      Interval Training   Interval Training No      Recumbant Bike   Level 3    Minutes 15    METs 2.6      NuStep   Level 4    Minutes 15    METs 3.5      Home Exercise Plan   Plans to continue exercise at Home (comment)   walking   Frequency Add 2 additional days to program exercise sessions.    Initial Home Exercises Provided 12/11/21      Oxygen   Maintain Oxygen Saturation 88% or higher             Nutrition:  Target Goals: Understanding of nutrition guidelines, daily intake of sodium <1537m, cholesterol <2054m calories 30% from fat and 7% or less from saturated fats, daily to have 5 or more servings of fruits and vegetables.  Education: All About Nutrition: -Group instruction provided by verbal, written material, interactive activities, discussions, models, and posters to present general guidelines for heart healthy nutrition including fat, fiber, MyPlate, the role of sodium in heart healthy nutrition, utilization of the nutrition label,  and utilization of this knowledge for meal planning. Follow up email sent as well. Written material given at graduation.   Biometrics:  Pre Biometrics - 09/18/21 1528       Pre Biometrics   Height 5' 4.5" (1.638 m)    Weight 196 lb 6.4 oz (89.1 kg)    Waist Circumference 41.5 inches    Hip Circumference 45.5 inches    Waist to Hip Ratio 0.91 %    BMI (Calculated) 33.2    Single Leg Stand 2.71 seconds             Post Biometrics - 12/23/21 1049         Post  Biometrics   Height 5' 4.5" (1.638 m)    Weight 198 lb 4.8 oz (89.9 kg)    Waist Circumference 41.5 inches    Hip Circumference 47 inches    Waist to Hip Ratio 0.88 %    BMI (Calculated) 33.52    Single Leg Stand 3 seconds             Nutrition Therapy Plan and Nutrition Goals:  Nutrition Therapy & Goals - 11/12/21 1020       Nutrition Therapy   Diet Heart healthy, low Na, T2DM MNT    Drug/Food Interactions Statins/Certain Fruits    Protein (specify units) 85-90g    Fiber 23 grams    Whole Grain Foods 3 servings    Saturated Fats 13 max. grams    Fruits and Vegetables 8 servings/day    Sodium 2 grams      Personal Nutrition Goals   Nutrition Goal ST: add in heart healthy snacks, add fiber, healthy fat, and protein to smaller portions of dessert foods LT: limit saturated fat <13g/day, limit added sugar <24g/day, maintain BG <7, eat at least 23g of fiber per day, limit salt <2g/day    Comments 76 y.o. 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 PMHx includes HTN, COPD, GERD, T2DM, HLD, Ovarian cancer (diagnosis 2012) with recurrences in 2017 and 2021, DDD, chronic anemia. PSHx includes cholecystecomy, bowel obstruction. Relevant medications includes lipitor, ferrous sulfate, lorazepam, magnesium, omeprazole, B-12, humulin, metformin, trulicity.  Last A1C was 6.1. B: scrambled eggs with toast and coffee L: PB sandwich D: grill or pressure cooker: chicken, steak, or pork chops with potato, red meat 1x/week. Soups and beans like pinto beans a couple times per month. They go out to eat 1-2x/week. They use a small amount of oil (canola), whole grain bread, and she will use salt (she is not sure how much). Drinks: water with 1 glass of sweet tea for dinner. Kayla Wu reports her blood sugar will go up and down; lows of 50-65 and highers of 150. She will have sweets once per day like cake, cookies, ice cream. Discussed general heart healthy eating as well as  T2DM MNT. Suggested lowering her portion sizes of sweet desserts and adding extra protien, healthy fat, and fiber; greek yogurt and berries with cake, peanutbutter with cookie, and nuts/seeds like walnuts to ice cream as this may help with her BG control without removing these items completely. Also recommended adding in heart healthy snacks with fiber, healthy fat, and protein to her day - fruit with nuts/seeds, soup with beans and some crackers, 1/2 sandwich, bean salad for example as this can control her BG to prevent BG lows as well as sneak in some extra nutritionally dense foods.      Intervention Plan   Intervention Prescribe,  educate and counsel regarding individualized specific dietary modifications aiming towards targeted core components such as weight, hypertension, lipid management, diabetes, heart failure and other comorbidities.    Expected Outcomes Short Term Goal: Understand basic principles of dietary content, such as calories, fat, sodium, cholesterol and nutrients.;Short Term Goal: A plan has been developed with personal nutrition goals set during dietitian appointment.;Long Term Goal: Adherence to prescribed nutrition plan.             Nutrition Assessments:  MEDIFICTS Score Key: ?70 Need to make dietary changes  40-70 Heart Healthy Diet ? 40 Therapeutic Level Cholesterol Diet  Flowsheet Row Pulmonary Rehab from 12/25/2021 in [facility]  Picture Your Plate Total Score on Discharge 50      Picture Your Plate Scores: <29 Unhealthy dietary pattern with much room for improvement. 41-50 Dietary pattern unlikely to meet recommendations for good health and room for improvement. 51-60 More healthful dietary pattern, with some room for improvement.  >60 Healthy dietary pattern, although there may be some specific behaviors that could be improved.   Nutrition Goals Re-Evaluation:  Nutrition Goals Re-Evaluation     Kayla Wu Name 10/28/21 1022 11/20/21 1011  12/11/21 1008 01/06/22 1011       Goals   Nutrition Goal -- -- Short: add more fruits and vegetables to diet. Long: Continue to practice her healthy eating patterns. Short: add more fruits and vegetables to diet. Long: Continue to practice her healthy eating patterns.    Comment Kayla Wu has deferred to speak with the RD at this time. Kayla Wu spoke with the RD on the phone. They talked about adding more fruits and vegetables on her plate. They also discussed the importance of healthy snacks between meals to help manage her glucose levels. She is working on implementing these habits. Kayla Wu is doing well in rehab.   She admits to not sticking to it like she should. She is getting in lots of fruit and trying to add variety.  She has not watching her salt intake. We also talked about balancing carbs with protein and fat. She is doing good with portion control. Mette plans to continue to work towards having a variety of fruits and vegetables, limiting salt intake, and balancing protein, fat, and carbs upon graduation.    Expected Outcome -- Short: add more fruits and vegetables to diet. Long: Continue to practice her healthy eating patterns. Short: Work on [employer].  Long: COnitnue to focus on healthy eating habits Kayla Wu will continue with a heart healhty diet to help control risk factors.             Nutrition Goals Discharge (Final Nutrition Goals Re-Evaluation):  Nutrition Goals Re-Evaluation - 01/06/22 1011       Goals   Nutrition Goal Short: add more fruits and vegetables to diet. Long: Continue to practice her healthy eating patterns.    Comment Kayla Wu plans to continue to work towards having a variety of fruits and vegetables, limiting salt intake, and balancing protein, fat, and carbs upon graduation.    Expected Outcome Chau will continue with a heart healhty diet to help control risk factors.             Psychosocial: Target Goals: Acknowledge presence or absence of  significant depression and/or stress, maximize coping skills, provide positive support system. Participant is able to verbalize types and ability to use techniques and skills needed for reducing stress and depression.   Education: Stress, Anxiety, and Depression - Group verbal  and visual presentation to define topics covered.  Reviews how body is impacted by stress, anxiety, and depression.  Also discusses healthy ways to reduce stress and to treat/manage anxiety and depression.  Written material given at graduation. Flowsheet Row Pulmonary Rehab from 11/27/2021 in [facility] Cardiac and Pulmonary Rehab  Date 11/27/21  Educator [facility]  Instruction Review Code 1- [employer] Understanding       Education: Sleep Hygiene -Provides group verbal and written instruction about how sleep can affect your health.  Define sleep hygiene, discuss sleep cycles and impact of sleep habits. Review good sleep hygiene tips.    Initial Review & Psychosocial Screening:  Initial Psych Review & Screening - 09/16/21 1423       Initial Review   Current issues with Current Depression;Current Anxiety/Panic;Current Psychotropic Meds      Family Dynamics   Good Support System? Yes   Merrilee Seashore, husband     Barriers   Psychosocial barriers to participate in program There are no identifiable barriers or psychosocial needs.      Screening Interventions   Interventions Encouraged to exercise;To provide support and resources with identified psychosocial needs;Provide feedback about the scores to participant    Expected Outcomes Short Term goal: Utilizing psychosocial counselor, staff and physician to assist with identification of specific Stressors or current issues interfering with healing process. Setting desired goal for each stressor or current issue identified.;Long Term Goal: Stressors or current issues are controlled or eliminated.;Short Term goal: Identification and review with participant of any Quality of Life or Depression  concerns found by scoring the questionnaire.;Long Term goal: The participant improves quality of Life and PHQ9 Scores as seen by post scores and/or verbalization of changes             Quality of Life Scores:  Scores of 19 and below usually indicate a poorer quality of life in these areas.  A difference of  2-3 points is a clinically meaningful difference.  A difference of 2-3 points in the total score of the Quality of Life Index has been associated with significant improvement in overall quality of life, self-image, physical symptoms, and general health in studies assessing change in quality of life.  PHQ-9: Review Flowsheet  More data may exist      12/25/2021 12/04/2021 11/04/2021 10/11/2021 09/18/2021  Depression screen PHQ 2/9  Decreased Interest 1 0 _0 Down, Depressed, Hopeless 1 0 1 0 2  PHQ - 2 Score 2 0 _1 Altered sleeping _2 0 2  Tired, decreased energy 3 0 _3 Change in appetite 2 0 1 0 3  Feeling bad or failure about yourself  1 0 1 1 0  Trouble concentrating 1 0 _4 Moving slowly or fidgety/restless 1 0 1 0 1  Suicidal thoughts 1 0 0 0 2  PHQ-9 Score _5 Difficult doing work/chores Not difficult at all Not difficult at all Not difficult at all Not difficult at all Somewhat difficult   Interpretation of Total Score  Total Score Depression Severity:  1-4 = Minimal depression, 5-9 = Mild depression, 10-14 = Moderate depression, 15-19 = Moderately severe depression, 20-27 = Severe depression   Psychosocial Evaluation and Intervention:  Psychosocial Evaluation - 09/16/21 1427       Psychosocial Evaluation & Interventions   Interventions Encouraged to exercise with the program and follow exercise prescription    Comments Kayla Wu has no barriers to attending  the program. She lives with her husband Kayla Wu is her support. She wants to gain more stamina and decrease her shortness of breath. She is on effoxor fro some depression. She is managing well  with her meds. She is ready to get started.    Expected Outcomes STG Kayla Wu is able to attend all scheduled sessions with progess in her exerciseprescription and improvement with her shortness of breath symptoms. LTG [employer] with progression of her exercise after discharge    Continue Psychosocial Services  Follow up required by staff             Psychosocial Re-Evaluation:  Psychosocial Re-Evaluation     [place] Name 10/11/21 1025 11/20/21 1014 12/04/21 0957 12/11/21 1006 01/06/22 1017     Psychosocial Re-Evaluation   Current issues with Current Depression;Current Anxiety/Panic Current Depression;Current Anxiety/Panic Current Depression;Current Anxiety/Panic Current Depression;Current Anxiety/Panic;Current Sleep Concerns Current Depression;Current Anxiety/Panic;Current Sleep Concerns   Comments -- -- Kayla Wu is doing well in rehab.  Her PHQ scores have continued to improve.  She is down to a 2 now.  She is still struggling with her sleep but feeling better overall. Kayla Wu is doing well in rehab.  She is feeling good overall. PHQ has improved since starting.  She continues to struggle with sleep.  She has not brought it up with her doctor yet, but plans to at the next appointment. Kayla Wu is doing well in rehab.  She is feeling good overall. PHQ has improved since starting.  She continues to struggle with sleep.  She has not brought it up with her doctor yet, but plans to at the next appointment.   Expected Outcomes Reviewed patient health questionnaire (PHQ-9) with patient for follow up. Previously, patients score indicated signs/symptoms of depression.  Reviewed to see if patient is improving symptom wise while in program.  Score improved and patient states that it is because they have been moving more. Her biggest issue is lack of energy and not being able to focus. Reviewed patient health questionnaire (PHQ-9) with patient for follow up. Previously, patients score indicated  signs/symptoms of depression.  Reviewed to see if patient is improving symptom wise while in program.  Score improved and patient states that it is because they have been moving more. Her biggest issue was lack of energy and not being able to focus, but she states that her energy and focus have improved. She states that she has a good support system at home made up by her husband. Short: Continue to work on sleep Long: Continue to stay positive Short: Continue to work on sleep and talk to doctor  Long; Continue to look for positive Kayla Wu will follow up with her doctor about her sleep concerns at her next appointment and upon graduation will continue to work with at maintaining her good mental health habits.   Interventions Encouraged to attend Pulmonary Rehabilitation for the exercise;Stress management education Encouraged to attend Pulmonary Rehabilitation for the exercise;Stress management education -- Encouraged to attend Pulmonary Rehabilitation for the exercise;Stress management education --   Continue Psychosocial Services  Follow up required by staff Follow up required by staff -- Follow up required by staff No Follow up required  graduation            Psychosocial Discharge (Final Psychosocial Re-Evaluation):  Psychosocial Re-Evaluation - 01/06/22 1017       Psychosocial Re-Evaluation   Current issues with Current Depression;Current Anxiety/Panic;Current Sleep Concerns    Comments Kayla Wu is doing well in rehab.  She is  feeling good overall. PHQ has improved since starting.  She continues to struggle with sleep.  She has not brought it up with her doctor yet, but plans to at the next appointment.    Expected Outcomes Kayla Wu will follow up with her doctor about her sleep concerns at her next appointment and upon graduation will continue to work with at maintaining her good mental health habits.    Continue Psychosocial Services  No Follow up required   graduation             Education: Education Goals: Education classes will be provided on a weekly basis, covering required topics. Participant will state understanding/return demonstration of topics presented.  Learning Barriers/Preferences:   General Pulmonary Education Topics:  Infection Prevention: - Provides verbal and written material to individual with discussion of infection control including proper hand washing and proper equipment cleaning during exercise session. Flowsheet Row Pulmonary Rehab from 11/27/2021 in [facility] Cardiac and Pulmonary Rehab  Date 09/18/21  Educator NT  Instruction Review Code 1- Verbalizes Understanding       Falls Prevention: - Provides verbal and written material to individual with discussion of falls prevention and safety. Flowsheet Row Pulmonary Rehab from 11/27/2021 in [facility] Cardiac and Pulmonary Rehab  Date 09/16/21  Educator SB  Instruction Review Code 1- Verbalizes Understanding       Chronic Lung Disease Review: - Group verbal instruction with posters, models, PowerPoint presentations and videos,  to review new updates, new respiratory medications, new advancements in procedures and treatments. Providing information on websites and "800" numbers for continued self-education. Includes information about supplement oxygen, available portable oxygen systems, continuous and intermittent flow rates, oxygen safety, concentrators, and Medicare reimbursement for oxygen. Explanation of Pulmonary Drugs, including class, frequency, complications, importance of spacers, rinsing mouth after steroid MDI's, and proper cleaning methods for nebulizers. Review of basic lung anatomy and physiology related to function, structure, and complications of lung disease. Review of risk factors. Discussion about methods for diagnosing sleep apnea and types of masks and machines for OSA. Includes a review of the use of types of environmental controls: home humidity, furnaces, filters, dust  mite/pet prevention, HEPA vacuums. Discussion about weather changes, air quality and the benefits of nasal washing. Instruction on Warning signs, infection symptoms, calling MD promptly, preventive modes, and value of vaccinations. Review of effective airway clearance, coughing and/or vibration techniques. Emphasizing that all should Create an Action Plan. Written material given at graduation. Flowsheet Row Pulmonary Rehab from 11/27/2021 in [facility] Cardiac and Pulmonary Rehab  Education need identified 09/18/21  Date 11/20/21  Educator [facility]  Instruction Review Code 1- Verbalizes Understanding       AED/CPR: - Group verbal and written instruction with the use of models to demonstrate the basic use of the AED with the basic ABC's of resuscitation.    Anatomy and Cardiac Procedures: - Group verbal and visual presentation and models provide information about basic cardiac anatomy and function. Reviews the testing methods done to diagnose heart disease and the outcomes of the test results. Describes the treatment choices: Medical Management, Angioplasty, or Coronary Bypass Surgery for treating various heart conditions including Myocardial Infarction, Angina, Valve Disease, and Cardiac Arrhythmias.  Written material given at graduation.   Medication Safety: - Group verbal and visual instruction to review commonly prescribed medications for heart and lung disease. Reviews the medication, class of the drug, and side effects. Includes the steps to properly store meds and maintain the prescription regimen.  Written material given at graduation.  Other: -Provides group and verbal instruction on various topics (see comments)   Knowledge Questionnaire Score:  Knowledge Questionnaire Score - 12/25/21 1046       Knowledge Questionnaire Score   Post Score 15/18              Core Components/Risk Factors/Patient Goals at Admission:  Personal Goals and Risk Factors at Admission - 09/18/21 1534        Core Components/Risk Factors/Patient Goals on Admission    Weight Management Yes    Intervention Weight Management: Develop a combined nutrition and exercise program designed to reach desired caloric intake, while maintaining appropriate intake of nutrient and fiber, sodium and fats, and appropriate energy expenditure required for the weight goal.;Weight Management/Obesity: Establish reasonable short term and long term weight goals.;Obesity: Provide education and appropriate resources to help participant work on and attain dietary goals.    Admit Weight 196 lb 6.4 oz (89.1 kg)    Goal Weight: Short Term 190 lb (86.2 kg)    Goal Weight: Long Term 185 lb (83.9 kg)    Expected Outcomes Short Term: Continue to assess and modify interventions until short term weight is achieved;Long Term: Adherence to nutrition and physical activity/exercise program aimed toward attainment of established weight goal;Weight Loss: Understanding of general recommendations for a balanced deficit meal plan, which promotes 1-2 lb weight loss per week and includes a negative energy balance of [phone] kcal/d    Improve shortness of breath with ADL's Yes    Intervention Provide education, individualized exercise plan and daily activity instruction to help decrease symptoms of SOB with activities of daily living.    Expected Outcomes Short Term: Improve cardiorespiratory fitness to achieve a reduction of symptoms when performing ADLs;Long Term: Be able to perform more ADLs without symptoms or delay the onset of symptoms    Increase knowledge of respiratory medications and ability to use respiratory devices properly  Yes    Intervention Provide education and demonstration as needed of appropriate use of medications, inhalers, and oxygen therapy.    Expected Outcomes Short Term: Achieves understanding of medications use. Understands that oxygen is a medication prescribed by physician. Demonstrates appropriate use of inhaler and  oxygen therapy.;Long Term: Maintain appropriate use of medications, inhalers, and oxygen therapy.    Diabetes Yes    Intervention Provide education about signs/symptoms and action to take for hypo/hyperglycemia.;Provide education about proper nutrition, including hydration, and aerobic/resistive exercise prescription along with prescribed medications to achieve blood glucose in normal ranges: Fasting glucose 65-99 mg/dL    Expected Outcomes Short Term: Participant verbalizes understanding of the signs/symptoms and immediate care of hyper/hypoglycemia, proper foot care and importance of medication, aerobic/resistive exercise and nutrition plan for blood glucose control.;Long Term: Attainment of HbA1C < 7%.    Hypertension Yes    Intervention Provide education on lifestyle modifcations including regular physical activity/exercise, weight management, moderate sodium restriction and increased consumption of fresh fruit, vegetables, and low fat dairy, alcohol moderation, and smoking cessation.;Monitor prescription use compliance.    Expected Outcomes Short Term: Continued assessment and intervention until BP is < 140/24m HG in hypertensive participants. < 130/871mHG in hypertensive participants with diabetes, heart failure or chronic kidney disease.;Long Term: Maintenance of blood pressure at goal levels.    Lipids Yes    Intervention Provide education and support for participant on nutrition & aerobic/resistive exercise along with prescribed medications to achieve LDL <7049mHDL >49m18m  Expected Outcomes Short Term: Participant states understanding of desired cholesterol values  and is compliant with medications prescribed. Participant is following exercise prescription and nutrition guidelines.;Long Term: Cholesterol controlled with medications as prescribed, with individualized exercise RX and with personalized nutrition plan. Value goals: LDL < 22m, HDL > 40 mg.             Education:Diabetes -  Individual verbal and written instruction to review signs/symptoms of diabetes, desired ranges of glucose level fasting, after meals and with exercise. Acknowledge that pre and post exercise glucose checks will be done for 3 sessions at entry of program. [facility]  Date 09/18/21  Educator NT  Instruction Review Code 1- Verbalizes Understanding       Know Your Numbers and Heart Failure: - Group verbal and visual instruction to discuss disease risk factors for cardiac and pulmonary disease and treatment options.  Reviews associated critical values for Overweight/Obesity, Hypertension, Cholesterol, and Diabetes.  Discusses basics of heart failure: signs/symptoms and treatments.  Introduces Heart Failure Zone chart for action plan for heart failure.  Written material given at graduation. [facility]  Date 11/13/21  Educator SB  Instruction Review Code 1- Verbalizes Understanding       Core Components/Risk Factors/Patient Goals Review:   Goals and Risk Factor Review     Row Name 10/28/21 1023 11/20/21 1016 12/11/21 1009 01/06/22 1016       Core Components/Risk Factors/Patient Goals Review   Personal Goals Review Weight Management/Obesity;Hypertension;Lipids;Diabetes Weight Management/Obesity;Hypertension;Lipids;Diabetes Weight Management/Obesity;Hypertension;Lipids;Diabetes;Increase knowledge of respiratory medications and ability to use respiratory devices properly.;Improve shortness of breath with ADL's Weight Management/Obesity;Hypertension;Lipids;Diabetes;Increase knowledge of respiratory medications and ability to use respiratory devices properly.;Improve shortness of breath with ADL's    Review SRamonicastates that she would like to lose some weight. She has a short term goal of losing 10 lbs and a long term goal of 25 lbs. She states that she has  not been checking her BP at home but was encouraged to do so especially with exercise. SAbiagealdoes check her blood sugars at home and states that they are within normal ranges. SRovenastates that she would like to lose some weight. She has a short term goal of losing 10 lbs and a long term goal of 25 lbs. She states that she has not been checking her BP at home but was encouraged to do so especially with exercise. SDominikadoes check her blood sugars at home and states that they have been a little high but are overall within normal ranges. SBergenis doing wel in rehab.  Her weight is staying the same.  She would like to lose more, but does not stick to her diet all the time.  Her sugars and pressures are doing well.  her breathing is getting better.  She only notes SOB when she walks.  She is working with her doctor to get a small tank to use for walking.  She is doing well on her meds. Upon graduation SEmilewill continue to take all meds and monitor blood pressure and blood sugars at home, and continue to work on weight loss goals.    Expected Outcomes Short: begin to checl BP at home. Long: Continue to monitor lifestyle risk factors. Short: begin to check BP at home. Long: Continue to monitor lifestyle risk factors. Short: Contineu to work on weight loss Long: Continue to monitor risk factors SSoriahwill continue to work on weight loss goals and  risk factor control upon graduation.             Core Components/Risk Factors/Patient Goals at Discharge (Final Review):   Goals and Risk Factor Review - 01/06/22 1016       Core Components/Risk Factors/Patient Goals Review   Personal Goals Review Weight Management/Obesity;Hypertension;Lipids;Diabetes;Increase knowledge of respiratory medications and ability to use respiratory devices properly.;Improve shortness of breath with ADL's    Review Upon graduation Zoriana will continue to take all meds and monitor blood pressure and blood sugars at home, and  continue to work on weight loss goals.    Expected Outcomes Akyia will continue to work on weight loss goals and risk factor control upon graduation.             ITP Comments:  ITP Comments     Row Name 09/16/21 1507 09/18/21 1448 10/09/21 1154 10/09/21 1332 11/06/21 0734   ITP Comments Virtual orientation call completed today. shehas an appointment on Date: 09/20/2021  for EP eval and gym Orientation.  Documentation of diagnosis can be found in [facility] Date: 08/01/2021 . Completed 6MWT and gym orientation. Initial ITP created and sent for review to Dr. Ottie Glazier, Medical Director. First full day of exercise!  Patient was oriented to gym and equipment including functions, settings, policies, and procedures.  Patient's individual exercise prescription and treatment plan were reviewed.  All starting workloads were established based on the results of the 6 minute walk test done at initial orientation visit.  The plan for exercise progression was also introduced and progression will be customized based on patient's performance and goals. 30 Day review completed. Medical Director ITP review done, changes made as directed, and signed approval by Medical Director.    NEW 30 Day review completed. Medical Director ITP review done, changes made as directed, and signed approval by Medical Director.    Row Name 11/12/21 1055 12/04/21 0951 01/01/22 0818       ITP Comments Completed initial RD consultation 30 Day review completed. Medical Director ITP review done, changes made as directed, and signed approval by Medical Director. 30 Day review completed. Medical Director ITP review done, changes made as directed, and signed approval by Medical Director.              Comments: Discharge ITP

## 2022-01-07 ENCOUNTER — Other Ambulatory Visit: Payer: Self-pay | Admitting: Cardiovascular Disease

## 2022-01-14 DIAGNOSIS — C569 Malignant neoplasm of unspecified ovary: Secondary | ICD-10-CM | POA: Diagnosis not present

## 2022-01-22 DIAGNOSIS — E7800 Pure hypercholesterolemia, unspecified: Secondary | ICD-10-CM | POA: Diagnosis not present

## 2022-01-22 DIAGNOSIS — I10 Essential (primary) hypertension: Secondary | ICD-10-CM | POA: Diagnosis not present

## 2022-01-22 DIAGNOSIS — J441 Chronic obstructive pulmonary disease with (acute) exacerbation: Secondary | ICD-10-CM | POA: Diagnosis not present

## 2022-01-22 DIAGNOSIS — E119 Type 2 diabetes mellitus without complications: Secondary | ICD-10-CM | POA: Diagnosis not present

## 2022-01-30 DIAGNOSIS — R079 Chest pain, unspecified: Secondary | ICD-10-CM | POA: Diagnosis not present

## 2022-02-18 ENCOUNTER — Telehealth: Payer: Self-pay | Admitting: Cardiovascular Disease

## 2022-02-18 MED ORDER — BISOPROLOL FUMARATE 5 MG PO TABS: 5.0000 mg | ORAL_TABLET | Freq: Every day | ORAL | 1 refills | Status: DC

## 2022-02-18 NOTE — Telephone Encounter (Signed)
bisoprolol (ZEBETA) 5 MG tablet 90 tablet 1 01/07/2022    Sig - Route: TAKE 1 TABLET EVERY DAY - Oral   Sent to pharmacy as: bisoprolol (ZEBETA) 5 MG tablet   E-Prescribing Status: Receipt confirmed by pharmacy (01/07/2022 10:06 AM EST)    Pharmacy  [facility] [place], [address] [place]

## 2022-02-18 NOTE — Telephone Encounter (Signed)
*  STAT* If patient is at the pharmacy, call can be transferred to refill team.   1. Which medications need to be refilled? (please list name of each medication and dose if known) bisoprolol  2. Which pharmacy/location (including street and city if local pharmacy) is medication to be sent to? [facility]  3. Do they need a 30 day or 90 day supply? 90 days and refills

## 2022-03-10 DIAGNOSIS — J9611 Chronic respiratory failure with hypoxia: Secondary | ICD-10-CM | POA: Diagnosis not present

## 2022-03-21 ENCOUNTER — Telehealth: Payer: Self-pay | Admitting: *Deleted

## 2022-03-21 NOTE — Patient Outreach (Signed)
  Care Coordination   03/21/2022 Name: Kayla Wu MRN: [id number] DOB: 1945-08-07   Care Coordination Outreach Attempts:  An unsuccessful telephone outreach was attempted today to offer the patient information about available care coordination services as a benefit of their health plan.   Follow Up Plan:  Additional outreach attempts will be made to offer the patient care coordination information and services.   Encounter Outcome:  No Answer   Care Coordination Interventions:  No, not indicated    Valente David, RN, MSN,  [facility] Care Management Care Management Coordinator [phone]

## 2022-03-26 DIAGNOSIS — D649 Anemia, unspecified: Secondary | ICD-10-CM | POA: Diagnosis not present

## 2022-03-26 DIAGNOSIS — Z79899 Other long term (current) drug therapy: Secondary | ICD-10-CM | POA: Diagnosis not present

## 2022-03-26 DIAGNOSIS — E118 Type 2 diabetes mellitus with unspecified complications: Secondary | ICD-10-CM | POA: Diagnosis not present

## 2022-03-26 DIAGNOSIS — E7800 Pure hypercholesterolemia, unspecified: Secondary | ICD-10-CM | POA: Diagnosis not present

## 2022-03-26 DIAGNOSIS — I10 Essential (primary) hypertension: Secondary | ICD-10-CM | POA: Diagnosis not present

## 2022-03-26 DIAGNOSIS — J431 Panlobular emphysema: Secondary | ICD-10-CM | POA: Diagnosis not present

## 2022-04-01 DIAGNOSIS — M1712 Unilateral primary osteoarthritis, left knee: Secondary | ICD-10-CM | POA: Diagnosis not present

## 2022-04-01 DIAGNOSIS — E118 Type 2 diabetes mellitus with unspecified complications: Secondary | ICD-10-CM | POA: Diagnosis not present

## 2022-04-03 DIAGNOSIS — G4733 Obstructive sleep apnea (adult) (pediatric): Secondary | ICD-10-CM | POA: Diagnosis not present

## 2022-04-09 DIAGNOSIS — C563 Malignant neoplasm of bilateral ovaries: Secondary | ICD-10-CM | POA: Diagnosis not present

## 2022-04-09 DIAGNOSIS — E7800 Pure hypercholesterolemia, unspecified: Secondary | ICD-10-CM | POA: Diagnosis not present

## 2022-04-09 DIAGNOSIS — I10 Essential (primary) hypertension: Secondary | ICD-10-CM | POA: Diagnosis not present

## 2022-04-09 DIAGNOSIS — Z170 Estrogen receptor positive status [ER+]: Secondary | ICD-10-CM | POA: Diagnosis not present

## 2022-04-09 DIAGNOSIS — E119 Type 2 diabetes mellitus without complications: Secondary | ICD-10-CM | POA: Diagnosis not present

## 2022-04-09 DIAGNOSIS — C569 Malignant neoplasm of unspecified ovary: Secondary | ICD-10-CM | POA: Diagnosis not present

## 2022-04-09 DIAGNOSIS — Z9221 Personal history of antineoplastic chemotherapy: Secondary | ICD-10-CM | POA: Diagnosis not present

## 2022-04-09 DIAGNOSIS — R799 Abnormal finding of blood chemistry, unspecified: Secondary | ICD-10-CM | POA: Diagnosis not present

## 2022-04-15 DIAGNOSIS — I517 Cardiomegaly: Secondary | ICD-10-CM | POA: Diagnosis not present

## 2022-04-15 DIAGNOSIS — S2231XA Fracture of one rib, right side, initial encounter for closed fracture: Secondary | ICD-10-CM | POA: Diagnosis not present

## 2022-04-15 DIAGNOSIS — M1712 Unilateral primary osteoarthritis, left knee: Secondary | ICD-10-CM | POA: Diagnosis not present

## 2022-04-15 DIAGNOSIS — J811 Chronic pulmonary edema: Secondary | ICD-10-CM | POA: Diagnosis not present

## 2022-04-15 DIAGNOSIS — C563 Malignant neoplasm of bilateral ovaries: Secondary | ICD-10-CM | POA: Diagnosis not present

## 2022-04-23 ENCOUNTER — Other Ambulatory Visit: Payer: Self-pay | Admitting: Orthopedic Surgery

## 2022-04-23 DIAGNOSIS — M25562 Pain in left knee: Secondary | ICD-10-CM

## 2022-04-23 DIAGNOSIS — M1712 Unilateral primary osteoarthritis, left knee: Secondary | ICD-10-CM

## 2022-04-24 DIAGNOSIS — Z79899 Other long term (current) drug therapy: Secondary | ICD-10-CM | POA: Diagnosis not present

## 2022-04-24 DIAGNOSIS — E785 Hyperlipidemia, unspecified: Secondary | ICD-10-CM | POA: Diagnosis not present

## 2022-04-24 DIAGNOSIS — E669 Obesity, unspecified: Secondary | ICD-10-CM | POA: Diagnosis not present

## 2022-04-24 DIAGNOSIS — Z794 Long term (current) use of insulin: Secondary | ICD-10-CM | POA: Diagnosis not present

## 2022-04-24 DIAGNOSIS — Z1231 Encounter for screening mammogram for malignant neoplasm of breast: Secondary | ICD-10-CM | POA: Diagnosis not present

## 2022-04-24 DIAGNOSIS — I10 Essential (primary) hypertension: Secondary | ICD-10-CM | POA: Diagnosis not present

## 2022-04-24 DIAGNOSIS — Z6835 Body mass index (BMI) 35.0-35.9, adult: Secondary | ICD-10-CM | POA: Diagnosis not present

## 2022-04-24 DIAGNOSIS — E119 Type 2 diabetes mellitus without complications: Secondary | ICD-10-CM | POA: Diagnosis not present

## 2022-04-24 DIAGNOSIS — Z0000 Encounter for general adult medical examination without abnormal findings: Secondary | ICD-10-CM | POA: Diagnosis not present

## 2022-04-25 ENCOUNTER — Other Ambulatory Visit: Payer: Self-pay | Admitting: Internal Medicine

## 2022-04-25 ENCOUNTER — Ambulatory Visit
Admission: RE | Admit: 2022-04-25 | Discharge: 2022-04-25 | Disposition: A | Discharge status: Home / Self Care | Payer: Medicare HMO | Source: Ambulatory Visit | Attending: Orthopedic Surgery | Admitting: Orthopedic Surgery

## 2022-04-25 DIAGNOSIS — Z1231 Encounter for screening mammogram for malignant neoplasm of breast: Secondary | ICD-10-CM

## 2022-04-25 DIAGNOSIS — M1712 Unilateral primary osteoarthritis, left knee: Secondary | ICD-10-CM | POA: Diagnosis not present

## 2022-04-25 DIAGNOSIS — M25562 Pain in left knee: Secondary | ICD-10-CM | POA: Diagnosis not present

## 2022-05-27 DIAGNOSIS — M84453A Pathological fracture, unspecified femur, initial encounter for fracture: Secondary | ICD-10-CM | POA: Diagnosis not present

## 2022-05-27 DIAGNOSIS — M1712 Unilateral primary osteoarthritis, left knee: Secondary | ICD-10-CM | POA: Diagnosis not present

## 2022-06-18 DIAGNOSIS — M84453A Pathological fracture, unspecified femur, initial encounter for fracture: Secondary | ICD-10-CM | POA: Diagnosis not present

## 2022-06-19 ENCOUNTER — Encounter: Payer: Self-pay | Admitting: Orthopedic Surgery

## 2022-06-20 ENCOUNTER — Other Ambulatory Visit: Payer: Self-pay | Admitting: Orthopedic Surgery

## 2022-06-23 ENCOUNTER — Encounter: Payer: Self-pay | Admitting: Orthopedic Surgery

## 2022-06-23 NOTE — Anesthesia Preprocedure Evaluation (Addendum)
Anesthesia Evaluation  Patient identified by MRN, date of birth, ID band Patient awake    Reviewed: Allergy & Precautions, H&P , NPO status , Patient's Chart, lab work & pertinent test results  Airway Mallampati: III  TM Distance: >3 FB Neck ROM: Full    Dental no notable dental hx.  Has 9 implants, not certain where each of them is located:   Pulmonary pneumonia, COPD, former smoker   Pulmonary exam normal breath sounds clear to auscultation       Cardiovascular hypertension, Normal cardiovascular exam Rhythm:Regular Rate:Normal     Neuro/Psych negative neurological ROS  negative psych ROS   GI/Hepatic negative GI ROS, Neg liver ROS,GERD  ,,  Endo/Other  diabetes    Renal/GU negative Renal ROS  negative genitourinary   Musculoskeletal negative musculoskeletal ROS (+) Arthritis ,    Abdominal   Peds negative pediatric ROS (+)  Hematology negative hematology ROS (+)   Anesthesia Other Findings COPD (chronic obstructive pulmonary disease) (HCC)  GERD (gastroesophageal reflux disease) Non-cardiac chest pain  Menopausal state DDD (degenerative disc disease)  Chronic urticaria Hypertension  Pneumonia Personal history of chemotherapy  Diabetes mellitus (HCC) Ovarian cancer (HCC)  Morbid obesity (HCC) COVID-19 virus infection  Diastolic dysfunction PAF (paroxysmal atrial fibrillation) (HCC) Atrial fibrillation with RVR (HCC) Hepatic steatosis  Pulmonary embolus (HCC) Rectovaginal fistula  Grade I diastolic dysfunction    Reproductive/Obstetrics negative OB ROS                             Anesthesia Physical Anesthesia Plan  ASA: 3  Anesthesia Plan: General   Post-op Pain Management:    Induction: Intravenous  PONV Risk Score and Plan:   Airway Management Planned: LMA  Additional Equipment:   Intra-op Plan:   Post-operative Plan: Extubation in OR  Informed Consent:  I have reviewed the patients History and Physical, chart, labs and discussed the procedure including the risks, benefits and alternatives for the proposed anesthesia with the patient or authorized representative who has indicated his/her understanding and acceptance.     Dental Advisory Given  Plan Discussed with: Anesthesiologist, CRNA and Surgeon  Anesthesia Plan Comments: (Discussed w/surgeon; he states patient will NOT need adductor canal block)        Anesthesia Quick Evaluation

## 2022-06-26 DIAGNOSIS — E118 Type 2 diabetes mellitus with unspecified complications: Secondary | ICD-10-CM | POA: Diagnosis not present

## 2022-06-26 DIAGNOSIS — D649 Anemia, unspecified: Secondary | ICD-10-CM | POA: Diagnosis not present

## 2022-06-26 DIAGNOSIS — I10 Essential (primary) hypertension: Secondary | ICD-10-CM | POA: Diagnosis not present

## 2022-06-26 DIAGNOSIS — Z79899 Other long term (current) drug therapy: Secondary | ICD-10-CM | POA: Diagnosis not present

## 2022-06-26 DIAGNOSIS — J431 Panlobular emphysema: Secondary | ICD-10-CM | POA: Diagnosis not present

## 2022-06-26 DIAGNOSIS — E7800 Pure hypercholesterolemia, unspecified: Secondary | ICD-10-CM | POA: Diagnosis not present

## 2022-06-27 ENCOUNTER — Ambulatory Visit: Payer: Medicare HMO | Admitting: Anesthesiology

## 2022-06-27 ENCOUNTER — Other Ambulatory Visit: Payer: Self-pay

## 2022-06-27 ENCOUNTER — Encounter: Payer: Self-pay | Admitting: Orthopedic Surgery

## 2022-06-27 ENCOUNTER — Ambulatory Visit
Admission: RE | Admit: 2022-06-27 | Discharge: 2022-06-27 | Disposition: A | Discharge status: Home / Self Care | Payer: Medicare HMO | Attending: Orthopedic Surgery | Admitting: Orthopedic Surgery

## 2022-06-27 ENCOUNTER — Ambulatory Visit: Payer: Medicare HMO

## 2022-06-27 ENCOUNTER — Encounter
Admission: RE | Disposition: A | Discharge status: Home / Self Care | Payer: Self-pay | Source: Home / Self Care | Attending: Orthopedic Surgery

## 2022-06-27 DIAGNOSIS — Z87891 Personal history of nicotine dependence: Secondary | ICD-10-CM | POA: Diagnosis not present

## 2022-06-27 DIAGNOSIS — M65862 Other synovitis and tenosynovitis, left lower leg: Secondary | ICD-10-CM | POA: Diagnosis not present

## 2022-06-27 DIAGNOSIS — J449 Chronic obstructive pulmonary disease, unspecified: Secondary | ICD-10-CM | POA: Diagnosis not present

## 2022-06-27 DIAGNOSIS — K219 Gastro-esophageal reflux disease without esophagitis: Secondary | ICD-10-CM | POA: Insufficient documentation

## 2022-06-27 DIAGNOSIS — Z6835 Body mass index (BMI) 35.0-35.9, adult: Secondary | ICD-10-CM | POA: Insufficient documentation

## 2022-06-27 DIAGNOSIS — I480 Paroxysmal atrial fibrillation: Secondary | ICD-10-CM | POA: Insufficient documentation

## 2022-06-27 DIAGNOSIS — I10 Essential (primary) hypertension: Secondary | ICD-10-CM | POA: Diagnosis not present

## 2022-06-27 DIAGNOSIS — S72412A Displaced unspecified condyle fracture of lower end of left femur, initial encounter for closed fracture: Secondary | ICD-10-CM | POA: Diagnosis not present

## 2022-06-27 DIAGNOSIS — X58XXXA Exposure to other specified factors, initial encounter: Secondary | ICD-10-CM | POA: Insufficient documentation

## 2022-06-27 DIAGNOSIS — M1712 Unilateral primary osteoarthritis, left knee: Secondary | ICD-10-CM | POA: Diagnosis not present

## 2022-06-27 DIAGNOSIS — M94262 Chondromalacia, left knee: Secondary | ICD-10-CM | POA: Diagnosis not present

## 2022-06-27 DIAGNOSIS — Z86711 Personal history of pulmonary embolism: Secondary | ICD-10-CM | POA: Diagnosis not present

## 2022-06-27 DIAGNOSIS — E119 Type 2 diabetes mellitus without complications: Secondary | ICD-10-CM | POA: Insufficient documentation

## 2022-06-27 DIAGNOSIS — M84453A Pathological fracture, unspecified femur, initial encounter for fracture: Secondary | ICD-10-CM | POA: Diagnosis not present

## 2022-06-27 DIAGNOSIS — Z9221 Personal history of antineoplastic chemotherapy: Secondary | ICD-10-CM | POA: Diagnosis not present

## 2022-06-27 DIAGNOSIS — S72432A Displaced fracture of medial condyle of left femur, initial encounter for closed fracture: Secondary | ICD-10-CM | POA: Diagnosis not present

## 2022-06-27 DIAGNOSIS — Z8543 Personal history of malignant neoplasm of ovary: Secondary | ICD-10-CM | POA: Diagnosis not present

## 2022-06-27 DIAGNOSIS — Z981 Arthrodesis status: Secondary | ICD-10-CM | POA: Diagnosis not present

## 2022-06-27 HISTORY — PX: KNEE ARTHROSCOPY WITH SUBCHONDROPLASTY: SHX6732

## 2022-06-27 HISTORY — DX: Sleep apnea, unspecified: G47.30

## 2022-06-27 HISTORY — DX: Other ill-defined heart diseases: I51.89

## 2022-06-27 HISTORY — DX: Chronic respiratory failure with hypoxia: J96.11

## 2022-06-27 HISTORY — DX: Idiopathic sleep related nonobstructive alveolar hypoventilation: G47.34

## 2022-06-27 LAB — GLUCOSE, CAPILLARY: Glucose-Capillary: 99 mg/dL (ref 70–99)

## 2022-06-27 SURGERY — ARTHROSCOPY, KNEE, WITH SUBCHONDROPLASTY
Anesthesia: General | Site: Knee | Laterality: Left

## 2022-06-27 MED ORDER — HYDROCODONE-ACETAMINOPHEN 5-325 MG PO TABS
2.0000 | ORAL_TABLET | Freq: Once | ORAL | Status: AC
  Administered 2022-06-27: 2 via ORAL

## 2022-06-27 MED ORDER — LACTATED RINGERS IV SOLN: INTRAVENOUS | Status: DC

## 2022-06-27 MED ORDER — DEXAMETHASONE SODIUM PHOSPHATE 4 MG/ML IJ SOLN
INTRAMUSCULAR | Status: DC | PRN
  Administered 2022-06-27: 4 mg via INTRAVENOUS

## 2022-06-27 MED ORDER — VASOPRESSIN 20 UNIT/ML IV SOLN
INTRAVENOUS | Status: DC | PRN
  Administered 2022-06-27: 1 [IU] via INTRAVENOUS

## 2022-06-27 MED ORDER — HYDROCODONE-ACETAMINOPHEN 5-325 MG PO TABS: 1.0000 | ORAL_TABLET | ORAL | 0 refills | Status: DC | PRN

## 2022-06-27 MED ORDER — PROPOFOL 10 MG/ML IV BOLUS
INTRAVENOUS | Status: DC | PRN
  Administered 2022-06-27: 150 mg via INTRAVENOUS

## 2022-06-27 MED ORDER — ASPIRIN 325 MG PO TBEC: 325.0000 mg | DELAYED_RELEASE_TABLET | Freq: Every day | ORAL | 0 refills | Status: AC

## 2022-06-27 MED ORDER — EPHEDRINE SULFATE-NACL 50-0.9 MG/10ML-% IV SOSY
PREFILLED_SYRINGE | INTRAVENOUS | Status: DC | PRN
  Administered 2022-06-27 (×3): 10 mg via INTRAVENOUS

## 2022-06-27 MED ORDER — LIDOCAINE-EPINEPHRINE 1 %-1:100000 IJ SOLN
INTRAMUSCULAR | Status: DC | PRN
  Administered 2022-06-27: 3 mL
  Administered 2022-06-27: 10 mL

## 2022-06-27 MED ORDER — ONDANSETRON 4 MG PO TBDP: 4.0000 mg | ORAL_TABLET | Freq: Three times a day (TID) | ORAL | 0 refills | Status: DC | PRN

## 2022-06-27 MED ORDER — GLYCOPYRROLATE 0.2 MG/ML IJ SOLN
INTRAMUSCULAR | Status: DC | PRN
  Administered 2022-06-27 (×2): .1 mg via INTRAVENOUS

## 2022-06-27 MED ORDER — ACETAMINOPHEN 500 MG PO TABS: 1000.0000 mg | ORAL_TABLET | Freq: Three times a day (TID) | ORAL | 2 refills | Status: AC

## 2022-06-27 MED ORDER — LACTATED RINGERS IR SOLN
Status: DC | PRN
  Administered 2022-06-27: 12000 mL

## 2022-06-27 MED ORDER — LIDOCAINE HCL (CARDIAC) PF 100 MG/5ML IV SOSY
PREFILLED_SYRINGE | INTRAVENOUS | Status: DC | PRN
  Administered 2022-06-27: 20 mg via INTRATRACHEAL

## 2022-06-27 MED ORDER — CEFAZOLIN SODIUM-DEXTROSE 2-4 GM/100ML-% IV SOLN
2.0000 g | INTRAVENOUS | Status: AC
  Administered 2022-06-27: 2 g via INTRAVENOUS

## 2022-06-27 MED ORDER — FENTANYL CITRATE (PF) 100 MCG/2ML IJ SOLN
INTRAMUSCULAR | Status: DC | PRN
  Administered 2022-06-27: 25 ug via INTRAVENOUS
  Administered 2022-06-27: 100 ug via INTRAVENOUS

## 2022-06-27 MED ORDER — IBUPROFEN 800 MG PO TABS: 800.0000 mg | ORAL_TABLET | Freq: Three times a day (TID) | ORAL | 0 refills | Status: AC

## 2022-06-27 MED ORDER — MIDAZOLAM HCL 5 MG/5ML IJ SOLN
INTRAMUSCULAR | Status: DC | PRN
  Administered 2022-06-27: 1 mg via INTRAVENOUS

## 2022-06-27 SURGICAL SUPPLY — 41 items
ADPR IRR PORT MULTIBAG TUBE (MISCELLANEOUS) ×1
APL PRP STRL LF DISP 70% ISPRP (MISCELLANEOUS) ×1
BLADE SHAVER 4.5X7 STR FR (MISCELLANEOUS) IMPLANT
BLADE SURG SZ11 CARB STEEL (BLADE) ×1 IMPLANT
CHLORAPREP W/TINT 26 (MISCELLANEOUS) ×1 IMPLANT
COOLER POLAR GLACIER W/PUMP (MISCELLANEOUS) ×1 IMPLANT
CUFF TOURN SGL QUICK 24 (TOURNIQUET CUFF) ×1
CUFF TRNQT CYL 24X4X16.5-23 (TOURNIQUET CUFF) IMPLANT
DRAPE C-ARM XRAY 36X54 (DRAPES) ×1 IMPLANT
DRAPE C-ARMOR (DRAPES) ×1 IMPLANT
DRAPE EXTREMITY T 121X128X90 (DISPOSABLE) ×1 IMPLANT
DRAPE IMP U-DRAPE 54X76 (DRAPES) ×1 IMPLANT
GAUZE SPONGE 4X4 12PLY STRL (GAUZE/BANDAGES/DRESSINGS) ×1 IMPLANT
GLOVE SRG 8 PF TXTR STRL LF DI (GLOVE) ×1 IMPLANT
GLOVE SURG ENC MOIS LTX SZ7.5 (GLOVE) ×2 IMPLANT
GLOVE SURG UNDER POLY LF SZ8 (GLOVE) ×1
GOWN STRL REUS W/ TWL LRG LVL3 (GOWN DISPOSABLE) ×1 IMPLANT
GOWN STRL REUS W/TWL LRG LVL3 (GOWN DISPOSABLE) ×1
GRAFT FILLER BONE 5ML (Knees) IMPLANT
IV LACTATED RINGER IRRG 3000ML (IV SOLUTION) ×4
IV LR IRRIG 3000ML ARTHROMATIC (IV SOLUTION) ×4 IMPLANT
KIT ACCUFILL 5CC (Knees) ×1 IMPLANT
KIT KNEE SCP 414.502 (Knees) ×1 IMPLANT
KIT TURNOVER KIT A (KITS) ×1 IMPLANT
MANIFOLD NEPTUNE II (INSTRUMENTS) ×2 IMPLANT
MAT ABSORB  FLUID 56X50 GRAY (MISCELLANEOUS) ×1
MAT ABSORB FLUID 56X50 GRAY (MISCELLANEOUS) ×2 IMPLANT
PACK ARTHROSCOPY KNEE (MISCELLANEOUS) ×1 IMPLANT
PAD ABD DERMACEA PRESS 5X9 (GAUZE/BANDAGES/DRESSINGS) ×2 IMPLANT
PAD WRAPON POLAR KNEE (MISCELLANEOUS) ×1 IMPLANT
PADDING CAST BLEND 6X4 STRL (MISCELLANEOUS) ×1 IMPLANT
SET Y ADAPTER MULIT-BAG IRRIG (MISCELLANEOUS) ×2 IMPLANT
SPONGE T-LAP 18X18 ~~LOC~~+RFID (SPONGE) IMPLANT
SUT ETHILON 3-0 FS-10 30 BLK (SUTURE) ×1
SUTURE EHLN 3-0 FS-10 30 BLK (SUTURE) ×1 IMPLANT
TOWEL OR 17X26 4PK STRL BLUE (TOWEL DISPOSABLE) ×2 IMPLANT
TUBING INFLOW SET DBFLO PUMP (TUBING) ×1 IMPLANT
TUBING OUTFLOW SET DBLFO PUMP (TUBING) ×1 IMPLANT
WAND WEREWOLF FLOW 90D (MISCELLANEOUS) IMPLANT
WATER STERILE IRR 500ML POUR (IV SOLUTION) ×1 IMPLANT
WRAPON POLAR PAD KNEE (MISCELLANEOUS) ×1

## 2022-06-27 NOTE — H&P (Signed)
Paper H&P to be scanned into permanent record. H&P reviewed. No significant changes noted.  

## 2022-06-27 NOTE — Progress Notes (Signed)
Patient's oxygen saturations noted to drop into the low 80'2 when she falls asleep. Pt husband informed RN that she wears oxygen at home sometimes. Anesthesia notified and informed patient's husband that he would need to go get home o2. While waiting for husband to return, saturations dropped to 82 percent with 2 LPM nasal cannula. It was advised that patient go to the hospital via EMS to be monitored over night and patient stated she was not going to do that. Pt agreed to sign AMA papers.

## 2022-06-27 NOTE — Anesthesia Postprocedure Evaluation (Signed)
Anesthesia Post Note  Patient: Kayla Wu  Procedure(s) Performed: Left knee arthroscopic chondroplasty, possible partial meniscectomy, and subchondroplasty to the medial femoral condyle (Left: Knee)  Patient location during evaluation: PACU Anesthesia Type: General Level of consciousness: awake Pain management: pain level controlled Vital Signs Assessment: vitals unstable and post-procedure vital signs reviewed and stable Respiratory status: spontaneous breathing, nonlabored ventilation and patient connected to nasal cannula oxygen Cardiovascular status: blood pressure returned to baseline and stable Postop Assessment: no apparent nausea or vomiting Anesthetic complications: no (see longer note as this area won't let me put in a proper note) Comments: Even sitting in chair, with 02 @2L /min/n/c, patient becomes seriously hypoxic.  Quickly drops to 83-84%, and has to be stimulated to breathe, to prevent oxygen from potentially dropping even lower. Husband went home to get her oxygen concentrator, but even with that, she needs oxygen during the night and needs monitoring. Dr. Allena Katz is here; have discussed all with him and he is aware and agrees to send her to hospital for overnight oxygen and monitoring, but patient refuses despite explanation of risks.  Patient to sign AMA form. Hx sleep apnea, but not CPAP. Husband had stated she was supposed to be on 1 L oxygen/min/n/c when sleeping, but husband says he put her on 2 liters. I have urged her to at least sit in her recliner chair and not in bed, and to use the oxygen and have her husband in his chair beside her to try to check on her and make sure she is awakened if oxygen levels drop. This is obviously not a great solution, but she and husband insist on going home, so cannot keep her if she and husband refuse.  Notified OR Director Vickki Muff as well.     No notable events documented.   Last Vitals:  Vitals:   06/27/22 1515  06/27/22 1530  BP: (!) 145/71 (!) 148/55  Pulse: 76 77  Resp: 12 11  Temp:  (!) 36.4 C  SpO2: 91% 94%    Last Pain:  Vitals:   06/27/22 1513  TempSrc:   PainSc: 5                  Donoven Pett C Ameilia Rattan

## 2022-06-27 NOTE — Transfer of Care (Signed)
Immediate Anesthesia Transfer of Care Note  Patient: Kayla Wu  Procedure(s) Performed: Left knee arthroscopic chondroplasty, possible partial meniscectomy, and subchondroplasty to the medial femoral condyle (Left: Knee)  Patient Location: PACU  Anesthesia Type: General  Level of Consciousness: awake, alert  and patient cooperative  Airway and Oxygen Therapy: Patient Spontanous Breathing and Patient connected to supplemental oxygen  Post-op Assessment: Post-op Vital signs reviewed, Patient's Cardiovascular Status Stable, Respiratory Function Stable, Patent Airway and No signs of Nausea or vomiting  Post-op Vital Signs: Reviewed and stable  Complications: No notable events documented.

## 2022-06-27 NOTE — Discharge Instructions (Signed)
Arthroscopic Knee Surgery   Post-Op Instructions   1. Bracing or crutches: Crutches will be provided at the time of discharge from the surgery center if you do not already have them.   2. Ice: You may be provided with a device (Polar Care) that allows you to ice the affected area effectively. Otherwise you can ice manually.    3. Driving:  Plan on not driving for at least one week. Please note that you are advised NOT to drive while taking narcotic pain medications as you may be impaired and unsafe to drive.   4. Activity: Ankle pumps several times an hour while awake to prevent blood clots. Weight bearing: as tolerated. Use crutches for as needed (usually ~1 week or less) until pain allows you to ambulate without a limp. Bending and straightening the knee is unlimited. Elevate knee above heart level as much as possible for one week. Avoid standing more than 5 minutes (consecutively) for the first week.  Avoid long distance travel for 2 weeks.   5. Medications:  - You have been provided a prescription for narcotic pain medicine. After surgery, take 1-2 narcotic tablets every 4 hours if needed for severe pain.  - You may take up to 3000mg/day of tylenol (acetaminophen). You can take 1000mg 3x/day. Please check your narcotic. If you have acetaminophen in your narcotic (each tablet will be 325mg), be careful not to exceed a total of 3000mg/day of acetaminophen.  - A prescription for anti-nausea medication will be provided in case the narcotic medicine causes nausea - take 1 tablet every 6 hours only if nauseated.  - Take ibuprofen 800 mg every 8 hours WITH food to reduce post-operative knee swelling. DO NOT STOP IBUPROFEN POST-OP UNTIL INSTRUCTED TO DO SO at first post-op office visit (10-14 days after surgery). However, please discontinue if you have any abdominal discomfort after taking this.  - Take enteric coated aspirin 325 mg once daily for 2 weeks to prevent blood clots.   6. Bandages: The  physical therapist should change the bandages at the first post-op appointment. If needed, the dressing supplies have been provided to you.   7. Physical Therapy: 1-2 times per week for 6 weeks. Therapy typically starts on post operative Day 3 or 4. You have been provided an order for physical therapy. The therapist will provide home exercises.   8. Work: May return to full work usually around 2 weeks after 1st post-operative visit. May do light duty/desk job in approximately 1-2 weeks when off of narcotics, pain is well-controlled, and swelling has decreased. Labor intensive jobs may require 4-6 weeks to return.      9. Post-Op Appointments: Your first post-op appointment will be with Dr. Fronnie Urton in approximately 2 weeks time.    If you find that they have not been scheduled please call the Orthopaedic Appointment front desk at [phone].  

## 2022-06-27 NOTE — Anesthesia Procedure Notes (Signed)
Procedure Name: LMA Insertion Date/Time: 06/27/2022 1:34 PM  Performed by: Emeterio Reeve, CRNAPre-anesthesia Checklist: Patient identified, Emergency Drugs available, Suction available, Patient being monitored and Timeout performed Patient Re-evaluated:Patient Re-evaluated prior to induction Oxygen Delivery Method: Circle system utilized Preoxygenation: Pre-oxygenation with 100% oxygen Induction Type: IV induction LMA: LMA inserted LMA Size: 4.0 Tube size: 4.0 mm Number of attempts: 1 Dental Injury: Teeth and Oropharynx as per pre-operative assessment

## 2022-06-27 NOTE — Op Note (Addendum)
Operative Note    SURGERY DATE: 06/27/2022   PRE-OP DIAGNOSIS:  1.  Left subchondral insufficiency fracture of medial femoral condyle 2.  Left tricompartmental degenerative changes   POST-OP DIAGNOSIS:  1.  Left subchondral insufficiency fracture of medial femoral condyle 2.  Left knee severe synovitis 3.  Left tricompartmental degenerative changes   PROCEDURES:  1. Left knee subchondroplasty of the medial femoral condyle (percutaneous treatment of medial femoral condyle insufficiency fracture) 2. Left knee partial synovectomy of patellofemoral, medial, and lateral compartments  3. Left knee chondroplasty of medial compartment    SURGEON: Rosealee Albee, MD   ANESTHESIA: Gen   ESTIMATED BLOOD LOSS: minimal   TOTAL IV FLUIDS: per anesthesia   INDICATION(S):  Kayla Wu is a 77 y.o. female with signs and symptoms as well as MRI finding of severe bone marrow edema and subchondral insufficiency fracture of the medial femoral condyle in the setting of degenerative changes of the medial compartment. The patient underwent a trial of nonsurgical management in the form of activity modifications, corticosteroid and viscosupplementation injection, and medical management without improvement in symptoms.  After discussion of risks, benefits, and alternatives to surgery, the patient elected to proceed.  The patient understands that she may still some persistent pain and may still undergo an arthroplasty type procedure in the future.   OPERATIVE FINDINGS:    Examination under anesthesia: A careful examination under anesthesia was performed.  Passive range of motion was: Hyperextension: 2.  Extension: 0.  Flexion: 120.  Lachman: normal. Pivot Shift: normal.  Posterior drawer: normal.  Varus stability in full extension: normal.  Varus stability in 30 degrees of flexion: normal.  Valgus stability in full extension: normal.  Valgus stability in 30 degrees of flexion: normal.   Intra-operative  findings: A thorough arthroscopic examination of the knee was performed.  The findings are: 1. Suprapatellar pouch: Severe synovitis diffusely 2. Undersurface of median ridge: Grade 2 degenerative changes 3. Medial patellar facet: Grade 1-2 degenerative changes 4. Lateral patellar facet: Grade 1-2 degenerative changes 5. Trochlea: Grade 1-2 degenerative changes 6. Lateral gutter/popliteus tendon: Significant synovitis about entire lateral gutter 7. Hoffa's fat pad: Significant synovitis 8. Medial gutter/plica: Significant synovitis about the entire medial gutter 9. ACL: Normal 10. PCL: Normal 11. Medial meniscus: Degenerative appearance without frank tear  12. Medial compartment cartilage: Large area of the medial femoral condyle measuring approximately 25 x 15 mm of grade 4 degenerative changes with surrounding diffuse grade 1 softening; medial tibial plateau with areas of grade 2-3 degenerative changes  13. Lateral meniscus: Significant degeneration with of the central fibers anteriorly and posteriorly 14. Lateral compartment cartilage: Grade 1 degenerative changes to the tibial plateau and lateral femoral condyle   OPERATIVE REPORT:     I identified Kayla Wu in the pre-operative holding area. I marked the operative knee with my initials. I reviewed the risks and benefits of the proposed surgical intervention and the patient (and/or patient's guardian) wished to proceed.  The patient underwent regional anesthesia in the preoperative holding area.  The patient was transferred to the operative suite and placed in the supine position with all bony prominences padded.  Anesthesia was administered. Appropriate IV antibiotics were administered prior to incision. The extremity was then prepped and draped in standard fashion. A time out was performed confirming the correct extremity, correct patient, and correct procedure.  Using fluoroscopic guidance and correlation with the patient's MRI, a  small stab incision was made over the medial femoral condyle.  A trocar with cannula was percutaneously drilled into the site of the subchondral insufficiency fracture on the medial femoral condyle such that all of the flutes were within bone.  Calcium phosphate mixture was appropriately mixed and 3cc of calcium phosphate were injected through the cannula into the medial femoral condyle subchondral insufficiency fracture.  Appropriate filling was seen on fluoroscopy.  Cannula was left in place until calcium phosphate material had completely hardened.  Arthroscopy portals were marked. Local anesthetic was injected to the planned portal sites. The anterolateral portal was established with an 11 blade. The arthroscope was placed in the anterolateral portal and then into the suprapatellar pouch.  A diagnostic knee scope was completed with the above findings.  We confirmed that there was no intra-articular extravasation of calcium phosphate material.   Next the medial portal was established under needle localization.  There was severe synovitis about the patellofemoral compartment, medial and lateral gutters, and Hoffa's fat pad.  A partial synovectomy was performed of each of these compartments using a combination of an oscillating shaver and ArthroCare wand until all synovitic appearing material was removed.  Next, a chondroplasty was performed of the medial compartment such that there were stable cartilage edges without any loose fragments of cartilage.  An oscillating shaver was used to debride the frayed edges of the lateral meniscus.  This did not involve significant resection of the meniscus.  The arthroscope was withdrawn from the joint.   The incisions were closed with 3-0 Nylon suture. Sterile dressings included Xeroform, 4x4s, Sof-Rol, and Bias wrap. A Polarcare was placed.  The patient was then awakened and taken to the PACU hemodynamically stable without complication.     POSTOPERATIVE PLAN: The  patient will be discharged home today. Aspirin 325 mg daily x 2 weeks for DVT prophylaxis.  Physical therapy will start on POD#3-4. Weight-bearing as tolerated. Follow up in 2 weeks per protocol.

## 2022-07-01 ENCOUNTER — Encounter: Payer: Self-pay | Admitting: Orthopedic Surgery

## 2022-07-02 ENCOUNTER — Ambulatory Visit
Admission: RE | Admit: 2022-07-02 | Discharge: 2022-07-02 | Disposition: A | Discharge status: Home / Self Care | Payer: Medicare HMO | Source: Ambulatory Visit | Attending: Internal Medicine | Admitting: Internal Medicine

## 2022-07-02 DIAGNOSIS — Z1231 Encounter for screening mammogram for malignant neoplasm of breast: Secondary | ICD-10-CM | POA: Insufficient documentation

## 2022-07-02 DIAGNOSIS — M25562 Pain in left knee: Secondary | ICD-10-CM | POA: Diagnosis not present

## 2022-07-02 DIAGNOSIS — Z9889 Other specified postprocedural states: Secondary | ICD-10-CM | POA: Diagnosis not present

## 2022-07-02 DIAGNOSIS — M25662 Stiffness of left knee, not elsewhere classified: Secondary | ICD-10-CM | POA: Diagnosis not present

## 2022-07-02 DIAGNOSIS — G8929 Other chronic pain: Secondary | ICD-10-CM | POA: Diagnosis not present

## 2022-07-02 DIAGNOSIS — R531 Weakness: Secondary | ICD-10-CM | POA: Diagnosis not present

## 2022-07-03 DIAGNOSIS — R8290 Unspecified abnormal findings in urine: Secondary | ICD-10-CM | POA: Diagnosis not present

## 2022-07-03 DIAGNOSIS — E7800 Pure hypercholesterolemia, unspecified: Secondary | ICD-10-CM | POA: Diagnosis not present

## 2022-07-03 DIAGNOSIS — N1831 Chronic kidney disease, stage 3a: Secondary | ICD-10-CM | POA: Diagnosis not present

## 2022-07-03 DIAGNOSIS — E1122 Type 2 diabetes mellitus with diabetic chronic kidney disease: Secondary | ICD-10-CM | POA: Diagnosis not present

## 2022-07-03 DIAGNOSIS — Z794 Long term (current) use of insulin: Secondary | ICD-10-CM | POA: Diagnosis not present

## 2022-07-03 DIAGNOSIS — I10 Essential (primary) hypertension: Secondary | ICD-10-CM | POA: Diagnosis not present

## 2022-07-03 DIAGNOSIS — Z79899 Other long term (current) drug therapy: Secondary | ICD-10-CM | POA: Diagnosis not present

## 2022-07-07 DIAGNOSIS — Z9889 Other specified postprocedural states: Secondary | ICD-10-CM | POA: Diagnosis not present

## 2022-07-07 DIAGNOSIS — M25662 Stiffness of left knee, not elsewhere classified: Secondary | ICD-10-CM | POA: Diagnosis not present

## 2022-07-07 DIAGNOSIS — R531 Weakness: Secondary | ICD-10-CM | POA: Diagnosis not present

## 2022-07-07 DIAGNOSIS — M25562 Pain in left knee: Secondary | ICD-10-CM | POA: Diagnosis not present

## 2022-07-07 DIAGNOSIS — C569 Malignant neoplasm of unspecified ovary: Secondary | ICD-10-CM | POA: Diagnosis not present

## 2022-07-07 DIAGNOSIS — G8929 Other chronic pain: Secondary | ICD-10-CM | POA: Diagnosis not present

## 2022-07-10 DIAGNOSIS — N1831 Chronic kidney disease, stage 3a: Secondary | ICD-10-CM | POA: Diagnosis not present

## 2022-07-10 DIAGNOSIS — Z794 Long term (current) use of insulin: Secondary | ICD-10-CM | POA: Diagnosis not present

## 2022-07-10 DIAGNOSIS — E1159 Type 2 diabetes mellitus with other circulatory complications: Secondary | ICD-10-CM | POA: Diagnosis not present

## 2022-07-10 DIAGNOSIS — E1129 Type 2 diabetes mellitus with other diabetic kidney complication: Secondary | ICD-10-CM | POA: Diagnosis not present

## 2022-07-10 DIAGNOSIS — R809 Proteinuria, unspecified: Secondary | ICD-10-CM | POA: Diagnosis not present

## 2022-07-10 DIAGNOSIS — E1122 Type 2 diabetes mellitus with diabetic chronic kidney disease: Secondary | ICD-10-CM | POA: Diagnosis not present

## 2022-07-10 DIAGNOSIS — E119 Type 2 diabetes mellitus without complications: Secondary | ICD-10-CM | POA: Diagnosis not present

## 2022-07-11 DIAGNOSIS — G8929 Other chronic pain: Secondary | ICD-10-CM | POA: Diagnosis not present

## 2022-07-11 DIAGNOSIS — M25562 Pain in left knee: Secondary | ICD-10-CM | POA: Diagnosis not present

## 2022-07-14 DIAGNOSIS — G8929 Other chronic pain: Secondary | ICD-10-CM | POA: Diagnosis not present

## 2022-07-14 DIAGNOSIS — M25662 Stiffness of left knee, not elsewhere classified: Secondary | ICD-10-CM | POA: Diagnosis not present

## 2022-07-14 DIAGNOSIS — R531 Weakness: Secondary | ICD-10-CM | POA: Diagnosis not present

## 2022-07-14 DIAGNOSIS — M25562 Pain in left knee: Secondary | ICD-10-CM | POA: Diagnosis not present

## 2022-07-14 DIAGNOSIS — Z9889 Other specified postprocedural states: Secondary | ICD-10-CM | POA: Diagnosis not present

## 2022-07-16 DIAGNOSIS — R531 Weakness: Secondary | ICD-10-CM | POA: Diagnosis not present

## 2022-07-16 DIAGNOSIS — M25662 Stiffness of left knee, not elsewhere classified: Secondary | ICD-10-CM | POA: Diagnosis not present

## 2022-07-16 DIAGNOSIS — Z9889 Other specified postprocedural states: Secondary | ICD-10-CM | POA: Diagnosis not present

## 2022-07-16 DIAGNOSIS — M25562 Pain in left knee: Secondary | ICD-10-CM | POA: Diagnosis not present

## 2022-07-16 DIAGNOSIS — G8929 Other chronic pain: Secondary | ICD-10-CM | POA: Diagnosis not present

## 2022-07-22 DIAGNOSIS — M25562 Pain in left knee: Secondary | ICD-10-CM | POA: Diagnosis not present

## 2022-07-22 DIAGNOSIS — M25662 Stiffness of left knee, not elsewhere classified: Secondary | ICD-10-CM | POA: Diagnosis not present

## 2022-07-22 DIAGNOSIS — Z9889 Other specified postprocedural states: Secondary | ICD-10-CM | POA: Diagnosis not present

## 2022-07-22 DIAGNOSIS — R531 Weakness: Secondary | ICD-10-CM | POA: Diagnosis not present

## 2022-07-22 DIAGNOSIS — G8929 Other chronic pain: Secondary | ICD-10-CM | POA: Diagnosis not present

## 2022-07-23 DIAGNOSIS — M84452A Pathological fracture, left femur, initial encounter for fracture: Secondary | ICD-10-CM | POA: Diagnosis not present

## 2022-07-30 DIAGNOSIS — R531 Weakness: Secondary | ICD-10-CM | POA: Diagnosis not present

## 2022-07-30 DIAGNOSIS — M25662 Stiffness of left knee, not elsewhere classified: Secondary | ICD-10-CM | POA: Diagnosis not present

## 2022-07-30 DIAGNOSIS — M25562 Pain in left knee: Secondary | ICD-10-CM | POA: Diagnosis not present

## 2022-07-30 DIAGNOSIS — Z9889 Other specified postprocedural states: Secondary | ICD-10-CM | POA: Diagnosis not present

## 2022-07-30 DIAGNOSIS — G8929 Other chronic pain: Secondary | ICD-10-CM | POA: Diagnosis not present

## 2022-08-04 DIAGNOSIS — M25562 Pain in left knee: Secondary | ICD-10-CM | POA: Diagnosis not present

## 2022-08-04 DIAGNOSIS — R531 Weakness: Secondary | ICD-10-CM | POA: Diagnosis not present

## 2022-08-04 DIAGNOSIS — G8929 Other chronic pain: Secondary | ICD-10-CM | POA: Diagnosis not present

## 2022-08-04 DIAGNOSIS — Z9889 Other specified postprocedural states: Secondary | ICD-10-CM | POA: Diagnosis not present

## 2022-08-04 DIAGNOSIS — M25662 Stiffness of left knee, not elsewhere classified: Secondary | ICD-10-CM | POA: Diagnosis not present

## 2022-08-06 DIAGNOSIS — M25562 Pain in left knee: Secondary | ICD-10-CM | POA: Diagnosis not present

## 2022-08-06 DIAGNOSIS — Z9889 Other specified postprocedural states: Secondary | ICD-10-CM | POA: Diagnosis not present

## 2022-08-06 DIAGNOSIS — M25662 Stiffness of left knee, not elsewhere classified: Secondary | ICD-10-CM | POA: Diagnosis not present

## 2022-08-06 DIAGNOSIS — R531 Weakness: Secondary | ICD-10-CM | POA: Diagnosis not present

## 2022-08-06 DIAGNOSIS — G8929 Other chronic pain: Secondary | ICD-10-CM | POA: Diagnosis not present

## 2022-08-11 ENCOUNTER — Other Ambulatory Visit: Payer: Self-pay | Admitting: Cardiovascular Disease

## 2022-08-11 NOTE — Telephone Encounter (Signed)
Pt scheduled on 9/10

## 2022-08-11 NOTE — Telephone Encounter (Signed)
Please schedule 12 month F/U appointment. Thank you! 

## 2022-08-20 DIAGNOSIS — M1712 Unilateral primary osteoarthritis, left knee: Secondary | ICD-10-CM | POA: Diagnosis not present

## 2022-08-20 DIAGNOSIS — Z9889 Other specified postprocedural states: Secondary | ICD-10-CM | POA: Diagnosis not present

## 2022-08-21 DIAGNOSIS — Z6834 Body mass index (BMI) 34.0-34.9, adult: Secondary | ICD-10-CM | POA: Diagnosis not present

## 2022-08-21 DIAGNOSIS — Z0000 Encounter for general adult medical examination without abnormal findings: Secondary | ICD-10-CM | POA: Diagnosis not present

## 2022-08-21 DIAGNOSIS — I10 Essential (primary) hypertension: Secondary | ICD-10-CM | POA: Diagnosis not present

## 2022-08-21 DIAGNOSIS — E7800 Pure hypercholesterolemia, unspecified: Secondary | ICD-10-CM | POA: Diagnosis not present

## 2022-08-21 DIAGNOSIS — E118 Type 2 diabetes mellitus with unspecified complications: Secondary | ICD-10-CM | POA: Diagnosis not present

## 2022-08-21 DIAGNOSIS — E669 Obesity, unspecified: Secondary | ICD-10-CM | POA: Diagnosis not present

## 2022-09-17 DIAGNOSIS — J449 Chronic obstructive pulmonary disease, unspecified: Secondary | ICD-10-CM | POA: Diagnosis not present

## 2022-09-23 DIAGNOSIS — J449 Chronic obstructive pulmonary disease, unspecified: Secondary | ICD-10-CM | POA: Diagnosis not present

## 2022-10-08 DIAGNOSIS — I10 Essential (primary) hypertension: Secondary | ICD-10-CM | POA: Diagnosis not present

## 2022-10-08 DIAGNOSIS — Z7985 Long-term (current) use of injectable non-insulin antidiabetic drugs: Secondary | ICD-10-CM | POA: Diagnosis not present

## 2022-10-08 DIAGNOSIS — Z8542 Personal history of malignant neoplasm of other parts of uterus: Secondary | ICD-10-CM | POA: Diagnosis not present

## 2022-10-08 DIAGNOSIS — C563 Malignant neoplasm of bilateral ovaries: Secondary | ICD-10-CM | POA: Diagnosis not present

## 2022-10-08 DIAGNOSIS — E119 Type 2 diabetes mellitus without complications: Secondary | ICD-10-CM | POA: Diagnosis not present

## 2022-10-08 DIAGNOSIS — Z9221 Personal history of antineoplastic chemotherapy: Secondary | ICD-10-CM | POA: Diagnosis not present

## 2022-10-08 DIAGNOSIS — Z8505 Personal history of malignant neoplasm of liver: Secondary | ICD-10-CM | POA: Diagnosis not present

## 2022-10-08 DIAGNOSIS — Z08 Encounter for follow-up examination after completed treatment for malignant neoplasm: Secondary | ICD-10-CM | POA: Diagnosis not present

## 2022-10-08 DIAGNOSIS — C569 Malignant neoplasm of unspecified ovary: Secondary | ICD-10-CM | POA: Diagnosis not present

## 2022-10-08 DIAGNOSIS — E7800 Pure hypercholesterolemia, unspecified: Secondary | ICD-10-CM | POA: Diagnosis not present

## 2022-10-13 NOTE — Progress Notes (Unsigned)
Cardiology Office Note  Date:  10/13/2022   ID:  Kayla Wu, DOB 08-28-1945, MRN [id number]  PCP:  Kayla Arbour, MD   No chief complaint on file.   HPI:  77 year old female with a history of  remote tobacco abuse (42 pack years),  COPD, followed by pulmonary diabetes,  hypertension,  hyperlipidemia, GERD, obesity, and ovarian cancer,  COVID infection May 2022 Aortic atherosclerosis on CT scan who presents for follow-up after recent hospitalization for COVID-19 and paroxysmal atrial fibrillation.  LOV 09/2021   In follow-up today reports she continues to have shortness of breath, chest tightness Followed by pulmonary, recent chest CTA showing coronary calcification No PE  Reports that she is off warfarin, stopped it after she learned there was no PE on chest CT scan Unable to afford NOACs  Legs cramping at rest, etiology unclear On lipitor  Sedentary, shopping Consider pulmonary rehab Followed by   CT chest: June 2023 scattered coronary artery calcifications.  EKG personally reviewed by myself on todays visit NSr rate 59 bpm no significant ST-T wave  Prior events, developed atrial fibrillation during COVID May 2022 Propranolol previously held by pulmonary Has made full recovery Maintained on warfarin, managed by [facility]  Echocardiogram May 2022 normal ejection fraction 60% normal right heart pressures  In follow-up today Main complaint is chronic low back pain, knee pain Sedentary, unable to exercise Presents today in a wheelchair Scheduled to receive cortisone shots next week  history of noncardiac chest pain with stress testing in 2016, which was normal.  Echocardiogram in 2019 showed normal LV function.    admitted to [facility] on Jun 25 2020 after being found to be hypoxic and COVID-positive at urgent care.   treated with remdesivir, steroids, zinc, nebulizers, and vitamin C.   slow improvement in respiratory status.    May 27, just 1  day prior to discharge, she developed atrial fibrillation with rapid ventricular response and rates in the 160s.    asymptomatic.   converted to sinus rhythm with PACs within 3 hours.   recommendation to initiate Eliquis and oral diltiazem.    Echocardiogram  06/2020 showed an EF of 60 to 65% without regional wall motion abnormalities, grade 1 diastolic dysfunction, and no significant valvular disease.     PMH:   has a past medical history of Atrial fibrillation with RVR (HCC) (06/29/2020), Chronic hypoxemic respiratory failure (HCC), Chronic urticaria, COPD (chronic obstructive pulmonary disease) (HCC), COVID-19 virus infection (06/2020), DDD (degenerative disc disease), Diabetes mellitus (HCC), Diastolic dysfunction, GERD (gastroesophageal reflux disease), Grade I diastolic dysfunction, Hepatic steatosis, Hypertension, Menopausal state, Morbid obesity (HCC), Non-cardiac chest pain, Ovarian cancer (HCC) (2012), Oxygen desaturation during sleep, PAF (paroxysmal atrial fibrillation) (HCC), Personal history of chemotherapy (2012), Pneumonia (1987), Pulmonary embolus (HCC) (06/2020), Rectovaginal fistula, and Sleep apnea.  PSH:    Past Surgical History:  Procedure Laterality Date   ABDOMINAL HYSTERECTOMY     APPENDECTOMY     CHOLECYSTECTOMY     ESOPHAGOGASTRODUODENOSCOPY (EGD) WITH PROPOFOL N/A 06/28/2021   Procedure: ESOPHAGOGASTRODUODENOSCOPY (EGD) WITH PROPOFOL;  Surgeon: Regis Bill, MD;  Location: [facility] ENDOSCOPY;  Service: Endoscopy;  Laterality: N/A;  DM, ON WARFARIN   FINGER ARTHROPLASTY Left 12/22/2014   Procedure: FINGER ARTHROPLASTY;  Surgeon: Myra Rude, MD;  Location: [facility] ORS;  Service: Orthopedics;  Laterality: Left;   hysterectomy     KNEE ARTHROSCOPY WITH SUBCHONDROPLASTY Left 06/27/2022   Procedure: Left knee arthroscopic chondroplasty, possible partial meniscectomy, and subchondroplasty to the medial femoral condyle;  Surgeon: Signa Kell, MD;  Location: [facility];  Service: Orthopedics;  Laterality: Left;  Diabetic    Current Outpatient Medications  Medication Sig Dispense Refill   acetaminophen (TYLENOL) 500 MG tablet Take 2 tablets (1,000 mg total) by mouth every 8 (eight) hours. 90 tablet 2   alendronate (FOSAMAX) 70 MG tablet Take 70 mg by mouth once a week. Take with a full glass of water on an empty stomach.     bisoprolol (ZEBETA) 5 MG tablet TAKE 1 TABLET EVERY DAY 90 tablet 0   calcitonin, salmon, (MIACALCIN/FORTICAL) 200 UNIT/ACT nasal spray Place 1 spray into alternate nostrils daily.     Calcium-Phosphorus-Vitamin D (CITRACAL +D3 PO) Take by mouth daily.     diltiazem (CARDIZEM CD) 180 MG 24 hr capsule Take 1 capsule (180 mg total) by mouth daily. 90 capsule 0   diphenhydrAMINE (BENADRYL) 50 MG capsule Take 1 capsule 1 hour prior to procedure 1 capsule 0   Fluticasone-Umeclidin-Vilant (TRELEGY ELLIPTA) 100-62.5-25 MCG/ACT AEPB Inhale into the lungs. (Patient not taking: Reported on 06/19/2022)     gabapentin (NEURONTIN) 600 MG tablet Take 600 mg by mouth 2 (two) times daily.     HYDROcodone-acetaminophen (NORCO) 5-325 MG tablet Take 1-2 tablets by mouth every 4 (four) hours as needed for moderate pain or severe pain. 15 tablet 0   hydrOXYzine (ATARAX) 50 MG tablet      insulin NPH-regular Human (70-30) 100 UNIT/ML injection Inject 20 Units into the skin as needed.     LORazepam (ATIVAN) 1 MG tablet Take 1 mg by mouth 2 (two) times daily as needed for anxiety or sleep.     losartan (COZAAR) 100 MG tablet Take 100 mg by mouth daily.     magnesium oxide (MAG-OX) 400 MG tablet Take 400 mg by mouth 2 (two) times daily.     metFORMIN (GLUCOPHAGE-XR) 500 MG 24 hr tablet Take 1,500 mg by mouth daily with supper.     metoprolol tartrate (LOPRESSOR) 25 MG tablet Take 1 tablet (25 mg total) by mouth once for 1 dose. Take 2 hours prior to your CT. 1 tablet 0   omeprazole (PRILOSEC) 40 MG capsule Take 1 capsule by mouth daily.      ondansetron (ZOFRAN-ODT) 4 MG disintegrating tablet Take 1 tablet (4 mg total) by mouth every 8 (eight) hours as needed for nausea or vomiting. 20 tablet 0   predniSONE (DELTASONE) 50 MG tablet Take 50 mg 13 hours, 7 hour, and 1 hour prior to procedure 3 tablet 0   rosuvastatin (CRESTOR) 10 MG tablet Take 10 mg by mouth daily.     Semaglutide, 2 MG/DOSE, (OZEMPIC, 2 MG/DOSE,) 8 MG/3ML SOPN Inject 2 mg into the skin once a week. sunday     TRULICITY 1.5 MG/0.5ML SOPN Inject 1.5 mg into the skin once a week. (Patient not taking: Reported on 06/19/2022)     venlafaxine XR (EFFEXOR-XR) 150 MG 24 hr capsule Take 150 mg by mouth daily. (take with 37.5mg  capsule to equal 187.5mg  total)     venlafaxine XR (EFFEXOR-XR) 37.5 MG 24 hr capsule Take 37.5 mg by mouth daily. (take with 150mg  capsule to equal 187.5mg  total)     vitamin B-12 (CYANOCOBALAMIN) 1000 MCG tablet Take 1,000 mcg by mouth daily.     No current facility-administered medications for this visit.    Allergies:   Iodinated contrast media   Social History:  The patient  reports that she quit smoking about 15 years ago. Her smoking  use included cigarettes. She started smoking about 57 years ago. She has a 84 pack-year smoking history. She has never used smokeless tobacco. She reports that she does not drink alcohol and does not use drugs.   Family History:   family history includes Breast cancer (age of onset: 80) in her mother; Colon cancer in her sister; Emphysema in her father; Throat cancer in her brother; Uterine cancer in her sister.    Review of Systems: Review of Systems  Constitutional: Negative.   HENT: Negative.    Respiratory: Negative.    Cardiovascular: Negative.   Gastrointestinal: Negative.   Musculoskeletal: Negative.   Neurological: Negative.   Psychiatric/Behavioral: Negative.    All other systems reviewed and are negative.    PHYSICAL EXAM: VS:  There were no vitals taken for this visit. , BMI There is no height  or weight on file to calculate BMI. GEN: Well nourished, well developed, in no acute distress HEENT: normal Neck: no JVD, carotid bruits, or masses Cardiac: RRR; no murmurs, rubs, or gallops,no edema  Respiratory:  clear to auscultation bilaterally, normal work of breathing GI: soft, nontender, nondistended, + BS MS: no deformity or atrophy Skin: warm and dry, no rash Neuro:  Strength and sensation are intact Psych: euthymic mood, full affect   Recent Labs: No results found for requested labs within last 365 days.    Lipid Panel Lab Results  Component Value Date   TRIG 110 06/25/2020      Wt Readings from Last 3 Encounters:  06/27/22 191 lb 9 oz (86.9 kg)  12/23/21 198 lb 4.8 oz (89.9 kg)  09/18/21 196 lb 6.4 oz (89.1 kg)     ASSESSMENT AND PLAN:  Problem List Items Addressed This Visit   None  Paroxysmal atrial fibrillation Prior episode of atrial fibrillation in the setting of COVID Discharged from the hospital on Eliquis, Was changed to warfarin Reports that she took herself off warfarin as she does not have a PE on CT scan  Severe emphysema/obstructive sleep apnea overlap Prior history COVID-19, hospitalized May 2022 High probability PE on VQ scan, was treated with warfarin Recent CT scan June 2023, no PE  When last seen by pulmonary August 01, 2021 it was recommended she stay on warfarin.  On today's visit she reports she is not on warfarin, she indicates that she stopped as there was no PE on CT scan  Shortness of breath, angina, chest tightness Long smoking history coronary calcification on chest CT scan She is concerned about blockages , we have recommended cardiac CTA for further evaluation  Diabetes type 2 We have encouraged continued exercise, careful diet management in an effort to lose weight.  Aortic atherosclerosis Seen on CT scan, cholesterol at goal May be having statin myalgias, having leg cramps.  May need a vacation from the Lipitor for  several weeks   Total encounter time more than 30 minutes  Greater than 50% was spent in counseling and coordination of care with the patient    Signed, Dossie Wu, M.D., Ph.D. [facility] [place], [place] [phone]

## 2022-10-14 ENCOUNTER — Encounter: Payer: Self-pay | Admitting: Cardiovascular Disease

## 2022-10-14 ENCOUNTER — Ambulatory Visit: Payer: Medicare HMO | Attending: Cardiovascular Disease | Admitting: Cardiovascular Disease

## 2022-10-14 ENCOUNTER — Other Ambulatory Visit: Payer: Self-pay | Admitting: Cardiovascular Disease

## 2022-10-14 VITALS — BP 130/60 | HR 53 | Ht 64.0 in | Wt 186.1 lb

## 2022-10-14 DIAGNOSIS — J9601 Acute respiratory failure with hypoxia: Secondary | ICD-10-CM | POA: Diagnosis not present

## 2022-10-14 DIAGNOSIS — I480 Paroxysmal atrial fibrillation: Secondary | ICD-10-CM | POA: Diagnosis not present

## 2022-10-14 DIAGNOSIS — E782 Mixed hyperlipidemia: Secondary | ICD-10-CM

## 2022-10-14 DIAGNOSIS — I10 Essential (primary) hypertension: Secondary | ICD-10-CM

## 2022-10-14 DIAGNOSIS — R072 Precordial pain: Secondary | ICD-10-CM

## 2022-10-14 DIAGNOSIS — Z7984 Long term (current) use of oral hypoglycemic drugs: Secondary | ICD-10-CM

## 2022-10-14 DIAGNOSIS — U071 COVID-19: Secondary | ICD-10-CM

## 2022-10-14 DIAGNOSIS — E119 Type 2 diabetes mellitus without complications: Secondary | ICD-10-CM | POA: Diagnosis not present

## 2022-10-14 NOTE — Patient Instructions (Signed)
Medication Instructions:  Xarelto 20 mg daily Patient assistance  If you need a refill on your cardiac medications before your next appointment, please call your pharmacy.   Lab work: No new labs needed  Testing/Procedures: No new testing needed  Follow-Up: At [facility], you and your health needs are our priority.  As part of our continuing mission to provide you with exceptional heart care, we have created designated Provider Care Teams.  These Care Teams include your primary Cardiologist (physician) and Advanced Practice Providers (APPs -  Physician Assistants and Nurse Practitioners) who all work together to provide you with the care you need, when you need it.  You will need a follow up appointment in 12 months  Providers on your designated Care Team:   Nicolasa Ducking, NP Eula Listen, PA-C Cadence Fransico Michael, [place]  COVID-19 Vaccine Information can be found at: [email or web address] For questions related to vaccine distribution or appointments, please email [email or web address] or call [phone].

## 2022-10-16 ENCOUNTER — Telehealth: Payer: Self-pay | Admitting: Cardiovascular Disease

## 2022-10-16 DIAGNOSIS — I4891 Unspecified atrial fibrillation: Secondary | ICD-10-CM

## 2022-10-16 MED ORDER — RIVAROXABAN 20 MG PO TABS
20.0000 mg | ORAL_TABLET | Freq: Every day | ORAL | 1 refills | Status: DC
Start: 2022-10-16 — End: 2023-03-31

## 2022-10-16 NOTE — Telephone Encounter (Signed)
Refill request

## 2022-10-16 NOTE — Telephone Encounter (Signed)
*  STAT* If patient is at the pharmacy, call can be transferred to refill team.   1. Which medications need to be refilled? (please list name of each medication and dose if known) Xarelto 20 MG  2. Which pharmacy/location (including street and city if local pharmacy) is medication to be sent to? [facility] - [place], [place] - [address]  3. Do they need a 30 day or 90 day supply?  90 day supply

## 2022-10-16 NOTE — Telephone Encounter (Addendum)
Xarelto 20mg  refill request received. Pt is 77 years old, weight-84.4kg, Crea-0.7 on 07/03/22 via [facility] from [place], last seen by Dr. Mariah Milling on 10/14/22, Diagnosis-Afib, CrCl-89.68 mL/min; Dose is appropriate based on dosing criteria.  No anticoagulants are on the pt's med list. Called pt and had to leave a message.   Per last OV note it states: Paroxysmal atrial fibrillation Prior episode of atrial fibrillation in the setting of COVID Discharged from the hospital on Eliquis, Was changed to warfarin Reports that she took herself off warfarin as she does not have a PE on CT scan Patient assistance forms filled out for Xarelto 20 mg daily  Spoke with pharmD and clarified and stated okay tro send as discussed in note.  At 244pm, spoke with pt and she stated she was told to start a blood thinner by Dr. Mariah Milling. She states she completed the assistance paperwork and called a number and she states she is not able to get any help with it.  She asked me to call her back cause she needs to call [facility] again for prices on eliquis & xarelto since her & Dr Mariah Milling discussed both.   Pt states she would like xarelto sent as it is affordable, confirmed pharmacy as well. Xarelto 20mg  refill sent to requested pharmacy.

## 2022-10-27 ENCOUNTER — Other Ambulatory Visit: Payer: Self-pay | Admitting: Cardiovascular Disease

## 2022-10-30 DIAGNOSIS — M1712 Unilateral primary osteoarthritis, left knee: Secondary | ICD-10-CM | POA: Diagnosis not present

## 2022-10-30 DIAGNOSIS — I2699 Other pulmonary embolism without acute cor pulmonale: Secondary | ICD-10-CM | POA: Diagnosis not present

## 2022-11-02 DIAGNOSIS — M1712 Unilateral primary osteoarthritis, left knee: Principal | ICD-10-CM | POA: Insufficient documentation

## 2022-11-02 DIAGNOSIS — M189 Osteoarthritis of first carpometacarpal joint, unspecified: Secondary | ICD-10-CM | POA: Insufficient documentation

## 2022-11-03 ENCOUNTER — Telehealth: Payer: Self-pay | Admitting: Cardiovascular Disease

## 2022-11-03 NOTE — Telephone Encounter (Signed)
Pre-operative Risk Assessment    Patient Name: Kayla Wu  DOB: 27-Jul-1945 MRN: [id number]      Request for Surgical Clearance    Procedure:   TOTAL LEFT KNEE ARTHROPLASTY  Date of Surgery:  Clearance TBD                               Surgeon:  DR Francesco Sor Surgeon's Group or Practice Name:  [facility] Phone number:  [phone] Fax number:  [phone]  Type of Clearance Requested:   - Medical   Type of Anesthesia:  Not Indicated   Additional requests/questions:    Signed, Dalia Heading   11/03/2022, 12:42 PM

## 2022-11-03 NOTE — Telephone Encounter (Signed)
Patient Name: Kayla Wu  DOB: 08/14/1945 MRN: [id number]  Primary Cardiologist: Julien Nordmann, MD  Chart reviewed as part of pre-operative protocol coverage.   Please contact requesting provider regarding Xarelto and need for holding prior to procedure.  Thank you   Napoleon Form, Leodis Rains, NP 11/03/2022, 12:58 PM

## 2022-11-03 NOTE — Telephone Encounter (Signed)
Pre-operative Risk Assessment    Patient Name: Kayla Wu  DOB: 12-08-1945 MRN: [id number]     Request for Surgical Clearance    Procedure:  Left Total Knee Arthroplasty  Date of Surgery:  Clearance TBD                                 Surgeon:  Dr. Francesco Sor Surgeon's Group or Practice Name:  [facility]  Phone number:  [phone] Fax number:  [phone]   Type of Clearance Requested:   - Medical    Type of Anesthesia:  Not Indicated   Additional requests/questions:    Signed, Narda Amber   11/03/2022, 3:43 PM

## 2022-11-03 NOTE — Telephone Encounter (Signed)
Pharmacy please advise on holding Xarelto prior to left total knee replacement scheduled for TBD. Thank you.

## 2022-11-03 NOTE — Telephone Encounter (Signed)
Per requesting office will need to hold Xarelto x 3 DAYS PRIOR.   [facility] AND FAX# WERE ENTERED INCORRECTLY:   CORRECT PH#: [phone]  CORRECT FAX#: [phone]

## 2022-11-04 ENCOUNTER — Telehealth: Payer: Self-pay

## 2022-11-04 NOTE — Telephone Encounter (Signed)
Patient with diagnosis of afib on Xarelto for anticoagulation.    Procedure:  TOTAL LEFT KNEE ARTHROPLASTY  Date of procedure: TBD   CHA2DS2-VASc Score = 6   This indicates a 9.7% annual risk of stroke. The patient's score is based upon: CHF History: 0 HTN History: 1 Diabetes History: 1 Stroke History: 0 Vascular Disease History: 1 Age Score: 2 Gender Score: 1      Patient with hx of afib in the setting of COVID (May 2022) and PE (Aug 2022).  CrCl 69 ml/min Platelet count 403  Per office protocol, patient can hold Xarelto for 3 days prior to procedure.    **This guidance is not considered finalized until pre-operative APP has relayed final recommendations.**

## 2022-11-04 NOTE — Telephone Encounter (Signed)
Name: Kayla Wu  DOB: 1945-04-30  MRN: [id number]  Primary Cardiologist: Julien Nordmann, MD   Preoperative team, please contact this patient and set up a phone call appointment for further preoperative risk assessment. Please obtain consent and complete medication review. Thank you for your help.  I confirm that guidance regarding antiplatelet and oral anticoagulation therapy has been completed and, if necessary, noted below.  Per office protocol, patient can hold Xarelto for 3 days prior to procedure   I also confirmed the patient resides in the state of [place]. As per [facility] Medical Board telemedicine laws, the patient must reside in the state in which the provider is licensed.   Napoleon Form, Leodis Rains, NP 11/04/2022, 7:57 AM [facility]

## 2022-11-04 NOTE — Telephone Encounter (Signed)
Patient agreeable with virtual appt; med list reviewed and consent given.

## 2022-11-04 NOTE — Telephone Encounter (Signed)
  Patient Consent for Virtual Visit         Kayla Wu has provided verbal consent on 11/04/2022 for a virtual visit (video or telephone).   CONSENT FOR VIRTUAL VISIT FOR:  Kayla Wu  By participating in this virtual visit I agree to the following:  I hereby voluntarily request, consent and authorize [facility] and its employed or contracted physicians, physician assistants, nurse practitioners or other licensed health care professionals (the Practitioner), to provide me with telemedicine health care services (the "Services") as deemed necessary by the treating Practitioner. I acknowledge and consent to receive the Services by the Practitioner via telemedicine. I understand that the telemedicine visit will involve communicating with the Practitioner through live audiovisual communication technology and the disclosure of certain medical information by electronic transmission. I acknowledge that I have been given the opportunity to request an in-person assessment or other available alternative prior to the telemedicine visit and am voluntarily participating in the telemedicine visit.  I understand that I have the right to withhold or withdraw my consent to the use of telemedicine in the course of my care at any time, without affecting my right to future care or treatment, and that the Practitioner or I may terminate the telemedicine visit at any time. I understand that I have the right to inspect all information obtained and/or recorded in the course of the telemedicine visit and may receive copies of available information for a reasonable fee.  I understand that some of the potential risks of receiving the Services via telemedicine include:  Delay or interruption in medical evaluation due to technological equipment failure or disruption; Information transmitted may not be sufficient (e.g. poor resolution of images) to allow for appropriate medical decision making by the  Practitioner; and/or  In rare instances, security protocols could fail, causing a breach of personal health information.  Furthermore, I acknowledge that it is my responsibility to provide information about my medical history, conditions and care that is complete and accurate to the best of my ability. I acknowledge that Practitioner's advice, recommendations, and/or decision may be based on factors not within their control, such as incomplete or inaccurate data provided by me or distortions of diagnostic images or specimens that may result from electronic transmissions. I understand that the practice of medicine is not an exact science and that Practitioner makes no warranties or guarantees regarding treatment outcomes. I acknowledge that a copy of this consent can be made available to me via my patient portal [facility] MyChart), or I can request a printed copy by calling the office of [facility].    I understand that my insurance will be billed for this visit.   I have read or had this consent read to me. I understand the contents of this consent, which adequately explains the benefits and risks of the Services being provided via telemedicine.  I have been provided ample opportunity to ask questions regarding this consent and the Services and have had my questions answered to my satisfaction. I give my informed consent for the services to be provided through the use of telemedicine in my medical care

## 2022-11-18 ENCOUNTER — Ambulatory Visit: Payer: Medicare HMO

## 2022-11-18 NOTE — Telephone Encounter (Signed)
Patient was seen in the office by Dr. Mariah Milling on 10/14/2022 therefore virtual visit for preoperative evaluation is not needed. I called her to verify that she has had no concerning changes in her medical history and remains active with no concerning symptoms. She is able to achieve > 4 METS activity without concerning symptoms. Her RCRI for MACE is low at 0.4%,  therefore according to [facility] guidelines, she can proceed without further testing. She verbalized understanding and agreement to hold Xarelto for 3 days prior to knee surgery.  Levi Aland, NP-C  11/18/2022, 12:01 PM [address], [address] Office [phone] Fax [phone]

## 2022-11-25 DIAGNOSIS — J431 Panlobular emphysema: Secondary | ICD-10-CM | POA: Diagnosis not present

## 2022-11-25 DIAGNOSIS — E7800 Pure hypercholesterolemia, unspecified: Secondary | ICD-10-CM | POA: Diagnosis not present

## 2022-11-25 DIAGNOSIS — E119 Type 2 diabetes mellitus without complications: Secondary | ICD-10-CM | POA: Diagnosis not present

## 2022-11-25 DIAGNOSIS — Z79899 Other long term (current) drug therapy: Secondary | ICD-10-CM | POA: Diagnosis not present

## 2022-11-25 DIAGNOSIS — I10 Essential (primary) hypertension: Secondary | ICD-10-CM | POA: Diagnosis not present

## 2022-11-25 DIAGNOSIS — R5381 Other malaise: Secondary | ICD-10-CM | POA: Diagnosis not present

## 2022-11-25 DIAGNOSIS — R5383 Other fatigue: Secondary | ICD-10-CM | POA: Diagnosis not present

## 2022-11-26 DIAGNOSIS — I871 Compression of vein: Secondary | ICD-10-CM | POA: Insufficient documentation

## 2022-11-26 DIAGNOSIS — R8290 Unspecified abnormal findings in urine: Secondary | ICD-10-CM | POA: Diagnosis not present

## 2022-11-26 DIAGNOSIS — M1990 Unspecified osteoarthritis, unspecified site: Secondary | ICD-10-CM | POA: Insufficient documentation

## 2022-11-26 NOTE — H&P (View-Only) (Signed)
MRN : [id number]  Kayla Wu is a 77 y.o. (Jul 18, 1945) female who presents with chief complaint of legs hurt and swell.  History of Present Illness:   The patient presents to the office for evaluation of past DVT and PE in association with DJD requiring joint replacement surgery.  PE/DVT was identified during a hospitalization at [facility] in 2021 and was treated with anticoagulation.  The presenting symptoms were shortness of breath associated with pain and swelling in the lower extremity.  She is scheduled for a left total knee replacement December 22, 2022.  The patient notes the leg continues to be mildly painful with dependency and still swells with long periods of dependency.  Symptoms are much better with elevation.  The patient notes minimal edema in the morning which increases to some degree throughout the day.    The patient has not been using compression therapy at this point.  No SOB or pleuritic chest pains.  No cough or hemoptysis.  The patient is currently on anticoagulation.  She takes Xarelto 20 mg p.o. daily.  No blood per rectum or blood in any sputum.  No excessive bruising per the patient.    No recent shortening of the patient's walking distance or new symptoms consistent with claudication.  No history of rest pain symptoms. No new ulcers or wounds of the lower extremities have occurred.  The patient denies amaurosis fugax or recent TIA symptoms. There are no recent neurological changes noted. No recent episodes of angina or shortness of breath documented.   Duplex ultrasound of the bilateral lower extremities venous system dated September 26, 2020 suggested obstruction proximal to the inguinal ligament but DVT is not identified on the left side.    Duplex ultrasound venous system bilateral lower extremities dated January 20, 2020 is negative for DVT.  CT angiogram of the chest for suspected PE dated Jun 07, 2014 was negative for PE.  No outpatient  medications have been marked as taking for the 11/27/22 encounter (Appointment) with Gilda Crease, Latina Craver, MD.    Past Medical History:  Diagnosis Date   Atrial fibrillation with RVR (HCC) 06/29/2020   Chronic hypoxemic respiratory failure (HCC)    Chronic urticaria    COPD (chronic obstructive pulmonary disease) (HCC)    COVID-19 virus infection 06/2020   DDD (degenerative disc disease)    Diabetes mellitus (HCC)    Diastolic dysfunction    a. 01/2018 Echo: Nl EF. Triv AI/MR/PR. Mild TR; b. 06/2020 Echo: EF 60-65%, no rwma, gr1 DD. Nl RV size/fxn. RVSP 14.66mmHg.   GERD (gastroesophageal reflux disease)    Grade I diastolic dysfunction    Hepatic steatosis    Hypertension    Menopausal state    Morbid obesity (HCC)    Non-cardiac chest pain    a. 07/2014 MV: EF 75%, no ischemia/artifact.   Ovarian cancer (HCC) 2012   s/p chemo    Oxygen desaturation during sleep    PAF (paroxysmal atrial fibrillation) (HCC)    a. Dx 06/2020 in setting of COVID infection; b. CHA2DS2VASc = 5-->eliquis.   Personal history of chemotherapy 2012   ovarian   Pneumonia 1987   Pulmonary embolus (HCC) 06/2020   Rectovaginal fistula    Sleep apnea     Past Surgical History:  Procedure Laterality Date   ABDOMINAL HYSTERECTOMY     APPENDECTOMY     CHOLECYSTECTOMY     ESOPHAGOGASTRODUODENOSCOPY (EGD) WITH PROPOFOL N/A 06/28/2021   Procedure: ESOPHAGOGASTRODUODENOSCOPY (  EGD) WITH PROPOFOL;  Surgeon: Regis Bill, MD;  Location[facility] ENDOSCOPY;  Service: Endoscopy;  Laterality: N/A;  DM, ON WARFARIN   FINGER ARTHROPLASTY Left 12/22/2014   Procedure: FINGER ARTHROPLASTY;  Surgeon: Myra Rude, MD;  Location: [facility] ORS;  Service: Orthopedics;  Laterality: Left;   hysterectomy     KNEE ARTHROSCOPY WITH SUBCHONDROPLASTY Left 06/27/2022   Procedure: Left knee arthroscopic chondroplasty, possible partial meniscectomy, and subchondroplasty to the medial femoral condyle;  Surgeon: Signa Kell, MD;   Location: [facility];  Service: Orthopedics;  Laterality: Left;  Diabetic    Social History Social History   Tobacco Use   Smoking status: Former    Current packs/day: 0.00    Average packs/day: 2.0 packs/day for 42.0 years (84.0 ttl pk-yrs)    Types: Cigarettes    Start date: 07/04/1965    Quit date: 07/05/2007    Years since quitting: 15.4   Smokeless tobacco: Never  Vaping Use   Vaping status: Never Used  Substance Use Topics   Alcohol use: No    Comment: occasional glass of wine once a month   Drug use: No    Family History Family History  Problem Relation Age of Onset   Breast cancer Mother 31   Emphysema Father        smoked   Colon cancer Sister    Uterine cancer Sister    Throat cancer Brother     Allergies  Allergen Reactions   Iodinated Contrast Media Hives, Shortness Of Breath and Itching    Patient states she had itching, hives, and SOB after injection.      REVIEW OF SYSTEMS (Negative unless checked)  Constitutional: [] Weight loss  [] Fever  [] Chills Cardiac: [] Chest pain   [] Chest pressure   [] Palpitations   [] Shortness of breath when laying flat   [] Shortness of breath with exertion. Vascular:  [] Pain in legs with walking   [x] Pain in legs at rest  [] History of DVT   [] Phlebitis   [x] Swelling in legs   [] Varicose veins   [] Non-healing ulcers Pulmonary:   [] Uses home oxygen   [] Productive cough   [] Hemoptysis   [] Wheeze  [] COPD   [] Asthma Neurologic:  [] Dizziness   [] Seizures   [] History of stroke   [] History of TIA  [] Aphasia   [] Vissual changes   [] Weakness or numbness in arm   [] Weakness or numbness in leg Musculoskeletal:   [] Joint swelling   [x] Joint pain   [] Low back pain Hematologic:  [] Easy bruising  [] Easy bleeding   [] Hypercoagulable state   [] Anemic Gastrointestinal:  [] Diarrhea   [] Vomiting  [x] Gastroesophageal reflux/heartburn   [] Difficulty swallowing. Genitourinary:  [] Chronic kidney disease   [] Difficult urination  [] Frequent  urination   [] Blood in urine Skin:  [] Rashes   [] Ulcers  Psychological:  [] History of anxiety   []  History of major depression.  Physical Examination  There were no vitals filed for this visit. There is no height or weight on file to calculate BMI. Gen: WD/WN, NAD Head: [place]/AT, No temporalis wasting.  Ear/Nose/Throat: Hearing grossly intact, nares w/o erythema or drainage, pinna without lesions Eyes: PER, EOMI, sclera nonicteric.  Neck: Supple, no gross masses.  No JVD.  Pulmonary:  Good air movement, no audible wheezing, no use of accessory muscles.  Cardiac: RRR, precordium not hyperdynamic. Vascular:  scattered varicosities present bilaterally.  Moderate venous stasis changes to the legs bilaterally.  2+ soft pitting edema. CEAP C4sEpAsPr   Vessel Right Left  Radial Palpable Palpable  Gastrointestinal:  soft, non-distended. No guarding/no peritoneal signs.  Musculoskeletal: M/S 5/5 throughout.  No deformity.  Neurologic: CN 2-12 intact. Pain and light touch intact in extremities.  Symmetrical.  Speech is fluent. Motor exam as listed above. Psychiatric: Judgment intact, Mood & affect appropriate for pt's clinical situation. Dermatologic: Venous rashes no ulcers noted.  No changes consistent with cellulitis. Lymph : No lichenification or skin changes of chronic lymphedema.  CBC Lab Results  Component Value Date   WBC 12.0 (H) 10/09/2020   HGB 11.9 (L) 10/09/2020   HCT 35.8 (L) 10/09/2020   MCV 95.2 10/09/2020   PLT 440 (H) 10/09/2020    BMET    Component Value Date/Time   NA 137 09/05/2021 1244   K 4.4 09/05/2021 1244   CL 107 09/05/2021 1244   CO2 21 (L) 09/05/2021 1244   GLUCOSE 97 09/05/2021 1244   BUN 18 09/05/2021 1244   CREATININE 0.89 09/05/2021 1244   CALCIUM 9.0 09/05/2021 1244   GFRNONAA >60 09/05/2021 1244   CrCl cannot be calculated (Patient's most recent lab result is older than the maximum 21 days allowed.).  COAG No results found for: "INR",  "PROTIME"  Radiology No results found.   Assessment/Plan 1. Venous obstruction The patient will continue anticoagulation for now as there have not been any problems or complications at this point.  IVC filter is strongly indicated prior to high risk orthopedic surgery.  Especially given the history of PE / DVT.  IVC filter placement will be done the week for surgery.  November 12 or 13.  Risk and benefits were reviewed the patient.  Indications for the procedure were reviewed.  All questions were answered, the patient agrees to proceed.   Elevation was stressed, such as the use of a recliner.  I have reviewed with the patient DVT and post phlebitic changes such as swelling and why it  causes symptoms such as pain.  I recommended to the patient to wear graduated compression stockings, beginning after three full days of anticoagulation.  Graduated compression should be worn on a daily basis. The patient should wear compression beginning first thing in the morning and removing them in the evening. The patient is instructed specifically not to sleep in the stockings.  In addition, behavioral modification including elevation during the day and avoidance of prolonged dependency will be initiated.    The patient will follow-up with me 30 days after the joint replacement surgery to discuss removal (this was also discussed today and the patient agrees with the plan to have the filter removed).   2. Primary osteoarthritis involving multiple joints Continue medications to treat the patient's degenerative disease as already ordered, these medications have been reviewed and there are no changes at this time.  She is currently scheduled with Dr. Ernest Pine for a left total knee replacement on December 22, 2022  Continued activity and therapy was stressed.  3. Mixed hyperlipidemia Continue statin as ordered and reviewed, no changes at this time  4. Benign essential HTN Continue antihypertensive medications  as already ordered, these medications have been reviewed and there are no changes at this time.  5. Atrial fibrillation with RVR (HCC) Continue antiarrhythmia medications as already ordered, these medications have been reviewed and there are no changes at this time.  Continue anticoagulation as ordered by Cardiology Service    Levora Dredge, MD  11/26/2022 10:50 AM

## 2022-11-26 NOTE — Progress Notes (Signed)
MRN : [id number]  Kayla Wu is a 77 y.o. (Jul 18, 1945) female who presents with chief complaint of legs hurt and swell.  History of Present Illness:   The patient presents to the office for evaluation of past DVT and PE in association with DJD requiring joint replacement surgery.  PE/DVT was identified during a hospitalization at [facility] in 2021 and was treated with anticoagulation.  The presenting symptoms were shortness of breath associated with pain and swelling in the lower extremity.  She is scheduled for a left total knee replacement December 22, 2022.  The patient notes the leg continues to be mildly painful with dependency and still swells with long periods of dependency.  Symptoms are much better with elevation.  The patient notes minimal edema in the morning which increases to some degree throughout the day.    The patient has not been using compression therapy at this point.  No SOB or pleuritic chest pains.  No cough or hemoptysis.  The patient is currently on anticoagulation.  She takes Xarelto 20 mg p.o. daily.  No blood per rectum or blood in any sputum.  No excessive bruising per the patient.    No recent shortening of the patient's walking distance or new symptoms consistent with claudication.  No history of rest pain symptoms. No new ulcers or wounds of the lower extremities have occurred.  The patient denies amaurosis fugax or recent TIA symptoms. There are no recent neurological changes noted. No recent episodes of angina or shortness of breath documented.   Duplex ultrasound of the bilateral lower extremities venous system dated September 26, 2020 suggested obstruction proximal to the inguinal ligament but DVT is not identified on the left side.    Duplex ultrasound venous system bilateral lower extremities dated January 20, 2020 is negative for DVT.  CT angiogram of the chest for suspected PE dated Jun 07, 2014 was negative for PE.  No outpatient  medications have been marked as taking for the 11/27/22 encounter (Appointment) with Gilda Crease, Latina Craver, MD.    Past Medical History:  Diagnosis Date   Atrial fibrillation with RVR (HCC) 06/29/2020   Chronic hypoxemic respiratory failure (HCC)    Chronic urticaria    COPD (chronic obstructive pulmonary disease) (HCC)    COVID-19 virus infection 06/2020   DDD (degenerative disc disease)    Diabetes mellitus (HCC)    Diastolic dysfunction    a. 01/2018 Echo: Nl EF. Triv AI/MR/PR. Mild TR; b. 06/2020 Echo: EF 60-65%, no rwma, gr1 DD. Nl RV size/fxn. RVSP 14.66mmHg.   GERD (gastroesophageal reflux disease)    Grade I diastolic dysfunction    Hepatic steatosis    Hypertension    Menopausal state    Morbid obesity (HCC)    Non-cardiac chest pain    a. 07/2014 MV: EF 75%, no ischemia/artifact.   Ovarian cancer (HCC) 2012   s/p chemo    Oxygen desaturation during sleep    PAF (paroxysmal atrial fibrillation) (HCC)    a. Dx 06/2020 in setting of COVID infection; b. CHA2DS2VASc = 5-->eliquis.   Personal history of chemotherapy 2012   ovarian   Pneumonia 1987   Pulmonary embolus (HCC) 06/2020   Rectovaginal fistula    Sleep apnea     Past Surgical History:  Procedure Laterality Date   ABDOMINAL HYSTERECTOMY     APPENDECTOMY     CHOLECYSTECTOMY     ESOPHAGOGASTRODUODENOSCOPY (EGD) WITH PROPOFOL N/A 06/28/2021   Procedure: ESOPHAGOGASTRODUODENOSCOPY (  EGD) WITH PROPOFOL;  Surgeon: Regis Bill, MD;  Location: [facility] ENDOSCOPY;  Service: Endoscopy;  Laterality: N/A;  DM, ON WARFARIN   FINGER ARTHROPLASTY Left 12/22/2014   Procedure: FINGER ARTHROPLASTY;  Surgeon: Myra Rude, MD;  Location: [facility] ORS;  Service: Orthopedics;  Laterality: Left;   hysterectomy     KNEE ARTHROSCOPY WITH SUBCHONDROPLASTY Left 06/27/2022   Procedure: Left knee arthroscopic chondroplasty, possible partial meniscectomy, and subchondroplasty to the medial femoral condyle;  Surgeon: Signa Kell, MD;   Location: [facility];  Service: Orthopedics;  Laterality: Left;  Diabetic    Social History Social History   Tobacco Use   Smoking status: Former    Current packs/day: 0.00    Average packs/day: 2.0 packs/day for 42.0 years (84.0 ttl pk-yrs)    Types: Cigarettes    Start date: 07/04/1965    Quit date: 07/05/2007    Years since quitting: 15.4   Smokeless tobacco: Never  Vaping Use   Vaping status: Never Used  Substance Use Topics   Alcohol use: No    Comment: occasional glass of wine once a month   Drug use: No    Family History Family History  Problem Relation Age of Onset   Breast cancer Mother 31   Emphysema Father        smoked   Colon cancer Sister    Uterine cancer Sister    Throat cancer Brother     Allergies  Allergen Reactions   Iodinated Contrast Media Hives, Shortness Of Breath and Itching    Patient states she had itching, hives, and SOB after injection.      REVIEW OF SYSTEMS (Negative unless checked)  Constitutional: [] Weight loss  [] Fever  [] Chills Cardiac: [] Chest pain   [] Chest pressure   [] Palpitations   [] Shortness of breath when laying flat   [] Shortness of breath with exertion. Vascular:  [] Pain in legs with walking   [x] Pain in legs at rest  [] History of DVT   [] Phlebitis   [x] Swelling in legs   [] Varicose veins   [] Non-healing ulcers Pulmonary:   [] Uses home oxygen   [] Productive cough   [] Hemoptysis   [] Wheeze  [] COPD   [] Asthma Neurologic:  [] Dizziness   [] Seizures   [] History of stroke   [] History of TIA  [] Aphasia   [] Vissual changes   [] Weakness or numbness in arm   [] Weakness or numbness in leg Musculoskeletal:   [] Joint swelling   [x] Joint pain   [] Low back pain Hematologic:  [] Easy bruising  [] Easy bleeding   [] Hypercoagulable state   [] Anemic Gastrointestinal:  [] Diarrhea   [] Vomiting  [x] Gastroesophageal reflux/heartburn   [] Difficulty swallowing. Genitourinary:  [] Chronic kidney disease   [] Difficult urination  [] Frequent  urination   [] Blood in urine Skin:  [] Rashes   [] Ulcers  Psychological:  [] History of anxiety   []  History of major depression.  Physical Examination  There were no vitals filed for this visit. There is no height or weight on file to calculate BMI. Gen: WD/WN, NAD Head: [place]/AT, No temporalis wasting.  Ear/Nose/Throat: Hearing grossly intact, nares w/o erythema or drainage, pinna without lesions Eyes: PER, EOMI, sclera nonicteric.  Neck: Supple, no gross masses.  No JVD.  Pulmonary:  Good air movement, no audible wheezing, no use of accessory muscles.  Cardiac: RRR, precordium not hyperdynamic. Vascular:  scattered varicosities present bilaterally.  Moderate venous stasis changes to the legs bilaterally.  2+ soft pitting edema. CEAP C4sEpAsPr   Vessel Right Left  Radial Palpable Palpable  Gastrointestinal:  soft, non-distended. No guarding/no peritoneal signs.  Musculoskeletal: M/S 5/5 throughout.  No deformity.  Neurologic: CN 2-12 intact. Pain and light touch intact in extremities.  Symmetrical.  Speech is fluent. Motor exam as listed above. Psychiatric: Judgment intact, Mood & affect appropriate for pt's clinical situation. Dermatologic: Venous rashes no ulcers noted.  No changes consistent with cellulitis. Lymph : No lichenification or skin changes of chronic lymphedema.  CBC Lab Results  Component Value Date   WBC 12.0 (H) 10/09/2020   HGB 11.9 (L) 10/09/2020   HCT 35.8 (L) 10/09/2020   MCV 95.2 10/09/2020   PLT 440 (H) 10/09/2020    BMET    Component Value Date/Time   NA 137 09/05/2021 1244   K 4.4 09/05/2021 1244   CL 107 09/05/2021 1244   CO2 21 (L) 09/05/2021 1244   GLUCOSE 97 09/05/2021 1244   BUN 18 09/05/2021 1244   CREATININE 0.89 09/05/2021 1244   CALCIUM 9.0 09/05/2021 1244   GFRNONAA >60 09/05/2021 1244   CrCl cannot be calculated (Patient's most recent lab result is older than the maximum 21 days allowed.).  COAG No results found for: "INR",  "PROTIME"  Radiology No results found.   Assessment/Plan 1. Venous obstruction The patient will continue anticoagulation for now as there have not been any problems or complications at this point.  IVC filter is strongly indicated prior to high risk orthopedic surgery.  Especially given the history of PE / DVT.  IVC filter placement will be done the week for surgery.  November 12 or 13.  Risk and benefits were reviewed the patient.  Indications for the procedure were reviewed.  All questions were answered, the patient agrees to proceed.   Elevation was stressed, such as the use of a recliner.  I have reviewed with the patient DVT and post phlebitic changes such as swelling and why it  causes symptoms such as pain.  I recommended to the patient to wear graduated compression stockings, beginning after three full days of anticoagulation.  Graduated compression should be worn on a daily basis. The patient should wear compression beginning first thing in the morning and removing them in the evening. The patient is instructed specifically not to sleep in the stockings.  In addition, behavioral modification including elevation during the day and avoidance of prolonged dependency will be initiated.    The patient will follow-up with me 30 days after the joint replacement surgery to discuss removal (this was also discussed today and the patient agrees with the plan to have the filter removed).   2. Primary osteoarthritis involving multiple joints Continue medications to treat the patient's degenerative disease as already ordered, these medications have been reviewed and there are no changes at this time.  She is currently scheduled with Dr. Ernest Pine for a left total knee replacement on December 22, 2022  Continued activity and therapy was stressed.  3. Mixed hyperlipidemia Continue statin as ordered and reviewed, no changes at this time  4. Benign essential HTN Continue antihypertensive medications  as already ordered, these medications have been reviewed and there are no changes at this time.  5. Atrial fibrillation with RVR (HCC) Continue antiarrhythmia medications as already ordered, these medications have been reviewed and there are no changes at this time.  Continue anticoagulation as ordered by Cardiology Service    Levora Dredge, MD  11/26/2022 10:50 AM

## 2022-11-27 ENCOUNTER — Encounter (INDEPENDENT_AMBULATORY_CARE_PROVIDER_SITE_OTHER): Payer: Self-pay | Admitting: Vascular Surgery

## 2022-11-27 ENCOUNTER — Ambulatory Visit (INDEPENDENT_AMBULATORY_CARE_PROVIDER_SITE_OTHER): Payer: Medicare HMO | Admitting: Vascular Surgery

## 2022-11-27 VITALS — BP 131/69 | HR 55 | Resp 16 | Wt 187.0 lb

## 2022-11-27 DIAGNOSIS — M150 Primary generalized (osteo)arthritis: Secondary | ICD-10-CM

## 2022-11-27 DIAGNOSIS — E782 Mixed hyperlipidemia: Secondary | ICD-10-CM | POA: Diagnosis not present

## 2022-11-27 DIAGNOSIS — I10 Essential (primary) hypertension: Secondary | ICD-10-CM | POA: Diagnosis not present

## 2022-11-27 DIAGNOSIS — I871 Compression of vein: Secondary | ICD-10-CM | POA: Diagnosis not present

## 2022-11-27 DIAGNOSIS — I4891 Unspecified atrial fibrillation: Secondary | ICD-10-CM | POA: Diagnosis not present

## 2022-12-02 ENCOUNTER — Encounter (INDEPENDENT_AMBULATORY_CARE_PROVIDER_SITE_OTHER): Payer: Self-pay | Admitting: Vascular Surgery

## 2022-12-08 ENCOUNTER — Telehealth (INDEPENDENT_AMBULATORY_CARE_PROVIDER_SITE_OTHER): Payer: Self-pay

## 2022-12-08 NOTE — Telephone Encounter (Signed)
Spoke with the patient and she is scheduled for a IVC filter placement with a 12:00 pm arrival time to the [facility] with Dr. Gilda Crease. Pre-procedure instructions were discussed and will be sent to Mychart and mailed.

## 2022-12-14 NOTE — Discharge Instructions (Signed)

## 2022-12-15 ENCOUNTER — Other Ambulatory Visit: Payer: Self-pay

## 2022-12-15 ENCOUNTER — Encounter: Payer: Self-pay | Admitting: Urgent Care

## 2022-12-15 ENCOUNTER — Encounter
Admission: RE | Admit: 2022-12-15 | Discharge: 2022-12-15 | Disposition: A | Discharge status: Home / Self Care | Payer: Medicare HMO | Source: Ambulatory Visit | Attending: Orthopedic Surgery | Admitting: Orthopedic Surgery

## 2022-12-15 VITALS — BP 140/64 | HR 58 | Temp 97.6°F | Resp 18 | Ht 64.0 in | Wt 186.0 lb

## 2022-12-15 DIAGNOSIS — R8290 Unspecified abnormal findings in urine: Secondary | ICD-10-CM | POA: Insufficient documentation

## 2022-12-15 DIAGNOSIS — R8271 Bacteriuria: Secondary | ICD-10-CM | POA: Insufficient documentation

## 2022-12-15 DIAGNOSIS — E119 Type 2 diabetes mellitus without complications: Secondary | ICD-10-CM | POA: Diagnosis not present

## 2022-12-15 DIAGNOSIS — M1712 Unilateral primary osteoarthritis, left knee: Secondary | ICD-10-CM | POA: Diagnosis not present

## 2022-12-15 DIAGNOSIS — R8281 Pyuria: Secondary | ICD-10-CM | POA: Insufficient documentation

## 2022-12-15 DIAGNOSIS — Z01812 Encounter for preprocedural laboratory examination: Secondary | ICD-10-CM | POA: Diagnosis not present

## 2022-12-15 LAB — URINALYSIS, ROUTINE W REFLEX MICROSCOPIC
Bilirubin Urine: NEGATIVE
Glucose, UA: 50 mg/dL — AB
Hgb urine dipstick: NEGATIVE
Ketones, ur: 5 mg/dL — AB
Nitrite: NEGATIVE
Protein, ur: 30 mg/dL — AB
Specific Gravity, Urine: 1.03 (ref 1.005–1.030)
WBC, UA: >50 WBC/hpf (ref 0–5)
pH: 5 (ref 5.0–8.0)

## 2022-12-15 LAB — SURGICAL PCR SCREEN
MRSA, PCR: NEGATIVE
Staphylococcus aureus: NEGATIVE

## 2022-12-15 LAB — SEDIMENTATION RATE: Sed Rate: 35 mm/h — ABNORMAL HIGH (ref 0–30)

## 2022-12-15 NOTE — Patient Instructions (Addendum)
Your procedure is scheduled on: 12/22/2022  Report to the Registration Desk on the 1st floor of the [facility]. To find out your arrival time, please call [phone] between 1PM - 3PM on: 12/19/2022  If your arrival time is 6:00 am, do not arrive before that time as the [facility] entrance doors do not open until 6:00 am.  REMEMBER: Instructions that are not followed completely may result in serious medical risk, up to and including death; or upon the discretion of your surgeon and anesthesiologist your surgery may need to be rescheduled.  Do not eat food after midnight the night before surgery.  No gum chewing or hard candies.  You may however, drink CLEAR liquids up to 2 hours before you are scheduled to arrive for your surgery. Do not drink anything within 2 hours of your scheduled arrival time.  Clear liquids include: - water  -G2  **Type 1 and Type 2 diabetics should only drink water.**  In addition, your doctor has ordered for you to drink the provided:   Gatorade G2 Drinking this carbohydrate drink up to two hours before surgery helps to reduce insulin resistance and improve patient outcomes. Please complete drinking 2 hours before scheduled arrival time.  One week prior to surgery: Stop Anti-inflammatories (NSAIDS) such as Advil, Aleve, Ibuprofen, Motrin, Naproxen, Naprosyn and Aspirin based products such as Excedrin, Goody's Powder, BC Powder. Stop ANY OVER THE COUNTER supplements until after surgery like  ferrous sulfate ,vitamin B-12  You may however, continue to take Tylenol if needed for pain up until the day of surgery.   Stop Xarelto 3 days prior to surgery- Last dose is  Nov. 14/ 2024  Metformin- stop two days prior to surgery. Last dose is Nov. 15/ 2024  Stop Ozempic 7 days prior to surgery- last Dose should be Nov 11/2022    Continue taking all of your other prescription medications up until the day of surgery.   ON THE DAY OF SURGERY ONLY TAKE THESE  MEDICATIONS WITH SIPS OF WATER:  diltiazem (CARDIZEM CD)  venlafaxine XR omeprazole (PRILOSEC)  gabapentin (NEURONTIN)      No Alcohol for 24 hours before or after surgery.  No Smoking including e-cigarettes for 24 hours before surgery.  No chewable tobacco products for at least 6 hours before surgery.  No nicotine patches on the day of surgery.  Do not use any "recreational" drugs for at least a week (preferably 2 weeks) before your surgery.  Please be advised that the combination of cocaine and anesthesia may have negative outcomes, up to and including death. If you test positive for cocaine, your surgery will be cancelled.  On the morning of surgery brush your teeth with toothpaste and water, you may rinse your mouth with mouthwash if you wish. Do not swallow any toothpaste or mouthwash.  Use CHG Soap or wipes as directed on instruction sheet provided for you .  Do not wear jewelry, make-up, hairpins, clips or nail polish.  For welded (permanent) jewelry: bracelets, anklets, waist bands, etc.  Please have this removed prior to surgery.  If it is not removed, there is a chance that hospital personnel will need to cut it off on the day of surgery.  Do not wear lotions, powders, or perfumes.   Do not shave body hair from the neck down 48 hours before surgery.  Contact lenses, hearing aids and dentures may not be worn into surgery.  Do not bring valuables to the hospital. [facility] is  not responsible for any missing/lost belongings or valuables.     Notify your doctor if there is any change in your medical condition (cold, fever, infection).  Wear comfortable clothing (specific to your surgery type) to the hospital.  After surgery, you can help prevent lung complications by doing breathing exercises.  Take deep breaths and cough every 1-2 hours. Your doctor may order a device called an Incentive Spirometer to help you take deep breaths.  If you are being admitted to  the hospital overnight, leave your suitcase in the car. After surgery it may be brought to your room.  In case of increased patient census, it may be necessary for you, the patient, to continue your postoperative care in the Same Day Surgery department.  If you are being discharged the day of surgery, you will not be allowed to drive home. You will need a responsible individual to drive you home and stay with you for 24 hours after surgery.   Please call the Pre-admissions Testing Dept. at [phone] if you have any questions about these instructions.  Surgery Visitation Policy:  Patients having surgery or a procedure may have two visitors.  Children under the age of 44 must have an adult with them who is not the patient.  Inpatient Visitation:    Visiting hours are 7 a.m. to 8 p.m. Up to four visitors are allowed at one time in a patient room. The visitors may rotate out with other people during the day.  One visitor age 52 or older may stay with the patient overnight and must be in the room by 8 p.m.    Preoperative Educational Videos for Total Hip, Knee and Shoulder Replacements  To better prepare for surgery, please view our videos that explain the physical activity and discharge planning required to have the best surgical recovery at [facility].  [email or web address]  Questions? Call [phone] or email [email or web address]     How to Use an Incentive Spirometer  An incentive spirometer is a tool that measures how well you are filling your lungs with each breath. Learning to take long, deep breaths using this tool can help you keep your lungs clear and active. This may help to reverse or lessen your chance of developing breathing (pulmonary) problems, especially infection. You may be asked to use a spirometer: After a surgery. If you have a lung problem or a history of smoking. After a long period of time when you have  been unable to move or be active. If the spirometer includes an indicator to show the highest number that you have reached, your health care provider or respiratory therapist will help you set a goal. Keep a log of your progress as told by your health care provider. What are the risks? Breathing too quickly may cause dizziness or cause you to pass out. Take your time so you do not get dizzy or light-headed. If you are in pain, you may need to take pain medicine before doing incentive spirometry. It is harder to take a deep breath if you are having pain. How to use your incentive spirometer  Sit up on the edge of your bed or on a chair. Hold the incentive spirometer so that it is in an upright position. Before you use the spirometer, breathe out normally. Place the mouthpiece in your mouth. Make sure your lips are closed tightly around it. Breathe in slowly and as deeply as you can through your mouth, causing the piston or the  ball to rise toward the top of the chamber. Hold your breath for 3-5 seconds, or for as long as possible. If the spirometer includes a coach indicator, use this to guide you in breathing. Slow down your breathing if the indicator goes above the marked areas. Remove the mouthpiece from your mouth and breathe out normally. The piston or ball will return to the bottom of the chamber. Rest for a few seconds, then repeat the steps 10 or more times. Take your time and take a few normal breaths between deep breaths so that you do not get dizzy or light-headed. Do this every 1-2 hours when you are awake. If the spirometer includes a goal marker to show the highest number you have reached (best effort), use this as a goal to work toward during each repetition. After each set of 10 deep breaths, cough a few times. This will help to make sure that your lungs are clear. If you have an incision on your chest or abdomen from surgery, place a pillow or a rolled-up towel firmly against the  incision when you cough. This can help to reduce pain while taking deep breaths and coughing. General tips When you are able to get out of bed: Walk around often. Continue to take deep breaths and cough in order to clear your lungs. Keep using the incentive spirometer until your health care provider says it is okay to stop using it. If you have been in the hospital, you may be told to keep using the spirometer at home. Contact a health care provider if: You are having difficulty using the spirometer. You have trouble using the spirometer as often as instructed. Your pain medicine is not giving enough relief for you to use the spirometer as told. You have a fever. Get help right away if: You develop shortness of breath. You develop a cough with bloody mucus from the lungs. You have fluid or blood coming from an incision site after you cough. Summary An incentive spirometer is a tool that can help you learn to take long, deep breaths to keep your lungs clear and active. You may be asked to use a spirometer after a surgery, if you have a lung problem or a history of smoking, or if you have been inactive for a long period of time. Use your incentive spirometer as instructed every 1-2 hours while you are awake. If you have an incision on your chest or abdomen, place a pillow or a rolled-up towel firmly against your incision when you cough. This will help to reduce pain. Get help right away if you have shortness of breath, you cough up bloody mucus, or blood comes from your incision when you cough. This information is not intended to replace advice given to you by your health care provider. Make sure you discuss any questions you have with your health care provider. Document Revised: 04/11/2019 Document Reviewed: 04/11/2019 Elsevier Patient Education  2023 Elsevier Inc.       Pre-operative 5 CHG Bath Instructions   You can play a key role in reducing the risk of infection after surgery.  Your skin needs to be as free of germs as possible. You can reduce the number of germs on your skin by washing with CHG (chlorhexidine gluconate) soap before surgery. CHG is an antiseptic soap that kills germs and continues to kill germs even after washing.   DO NOT use if you have an allergy to chlorhexidine/CHG or antibacterial soaps. If your skin becomes  reddened or irritated, stop using the CHG and notify one of our RNs at [phone].   Please shower with the CHG soap starting 4 days before surgery using the following schedule:        Please keep in mind the following:  DO NOT shave, including legs and underarms, starting the day of your first shower.   You may shave your face at any point before/day of surgery.  Place clean sheets on your bed the day you start using CHG soap. Use a clean washcloth (not used since being washed) for each shower. DO NOT sleep with pets once you start using the CHG.   CHG Shower Instructions:  If you choose to wash your hair and private area, wash first with your normal shampoo/soap.  After you use shampoo/soap, rinse your hair and body thoroughly to remove shampoo/soap residue.  Turn the water OFF and apply about 3 tablespoons (45 ml) of CHG soap to a CLEAN washcloth.  Apply CHG soap ONLY FROM YOUR NECK DOWN TO YOUR TOES (washing for 3-5 minutes)  DO NOT use CHG soap on face, private areas, open wounds, or sores.  Pay special attention to the area where your surgery is being performed.  If you are having back surgery, having someone wash your back for you may be helpful. Wait 2 minutes after CHG soap is applied, then you may rinse off the CHG soap.  Pat dry with a clean towel  Put on clean clothes/pajamas   If you choose to wear lotion, please use ONLY the CHG-compatible lotions on the back of this paper.     Additional instructions for the day of surgery: DO NOT APPLY any lotions, deodorants, cologne, or perfumes.   Put on clean/comfortable  clothes.  Brush your teeth.  Ask your nurse before applying any prescription medications to the skin.      CHG Compatible Lotions   Aveeno Moisturizing lotion  Cetaphil Moisturizing Cream  Cetaphil Moisturizing Lotion  Clairol Herbal Essence Moisturizing Lotion, Dry Skin  Clairol Herbal Essence Moisturizing Lotion, Extra Dry Skin  Clairol Herbal Essence Moisturizing Lotion, Normal Skin  Curel Age Defying Therapeutic Moisturizing Lotion with Alpha Hydroxy  Curel Extreme Care Body Lotion  Curel Soothing Hands Moisturizing Hand Lotion  Curel Therapeutic Moisturizing Cream, Fragrance-Free  Curel Therapeutic Moisturizing Lotion, Fragrance-Free  Curel Therapeutic Moisturizing Lotion, Original Formula  Eucerin Daily Replenishing Lotion  Eucerin Dry Skin Therapy Plus Alpha Hydroxy Crme  Eucerin Dry Skin Therapy Plus Alpha Hydroxy Lotion  Eucerin Original Crme  Eucerin Original Lotion  Eucerin Plus Crme Eucerin Plus Lotion  Eucerin TriLipid Replenishing Lotion  Keri Anti-Bacterial Hand Lotion  Keri Deep Conditioning Original Lotion Dry Skin Formula Softly Scented  Keri Deep Conditioning Original Lotion, Fragrance Free Sensitive Skin Formula  Keri Lotion Fast Absorbing Fragrance Free Sensitive Skin Formula  Keri Lotion Fast Absorbing Softly Scented Dry Skin Formula  Keri Original Lotion  Keri Skin Renewal Lotion Keri Silky Smooth Lotion  Keri Silky Smooth Sensitive Skin Lotion  Nivea Body Creamy Conditioning Oil  Nivea Body Extra Enriched Teacher, adult education Moisturizing Lotion Nivea Crme  Nivea Skin Firming Lotion  NutraDerm 30 Skin Lotion  NutraDerm Skin Lotion  NutraDerm Therapeutic Skin Cream  NutraDerm Therapeutic Skin Lotion  ProShield Protective Hand Cream  Provon moisturizing lotion

## 2022-12-16 ENCOUNTER — Ambulatory Visit
Admission: RE | Admit: 2022-12-16 | Discharge: 2022-12-16 | Disposition: A | Discharge status: Home / Self Care | Payer: Medicare HMO | Attending: Vascular Surgery | Admitting: Vascular Surgery

## 2022-12-16 ENCOUNTER — Encounter
Admission: RE | Disposition: A | Discharge status: Home / Self Care | Payer: Self-pay | Source: Home / Self Care | Attending: Vascular Surgery

## 2022-12-16 DIAGNOSIS — Z408 Encounter for other prophylactic surgery: Secondary | ICD-10-CM | POA: Diagnosis not present

## 2022-12-16 DIAGNOSIS — I480 Paroxysmal atrial fibrillation: Secondary | ICD-10-CM | POA: Insufficient documentation

## 2022-12-16 DIAGNOSIS — Z7901 Long term (current) use of anticoagulants: Secondary | ICD-10-CM | POA: Insufficient documentation

## 2022-12-16 DIAGNOSIS — I10 Essential (primary) hypertension: Secondary | ICD-10-CM | POA: Diagnosis not present

## 2022-12-16 DIAGNOSIS — Z86711 Personal history of pulmonary embolism: Secondary | ICD-10-CM | POA: Diagnosis not present

## 2022-12-16 DIAGNOSIS — I82409 Acute embolism and thrombosis of unspecified deep veins of unspecified lower extremity: Secondary | ICD-10-CM

## 2022-12-16 DIAGNOSIS — Z8616 Personal history of COVID-19: Secondary | ICD-10-CM | POA: Diagnosis not present

## 2022-12-16 DIAGNOSIS — Z87891 Personal history of nicotine dependence: Secondary | ICD-10-CM | POA: Diagnosis not present

## 2022-12-16 DIAGNOSIS — Z86718 Personal history of other venous thrombosis and embolism: Secondary | ICD-10-CM | POA: Diagnosis not present

## 2022-12-16 DIAGNOSIS — M159 Polyosteoarthritis, unspecified: Secondary | ICD-10-CM | POA: Diagnosis not present

## 2022-12-16 DIAGNOSIS — E782 Mixed hyperlipidemia: Secondary | ICD-10-CM | POA: Insufficient documentation

## 2022-12-16 HISTORY — PX: IVC FILTER INSERTION: CATH118245

## 2022-12-16 LAB — C-REACTIVE PROTEIN: CRP: 0.7 mg/dL (ref <1.0)

## 2022-12-16 LAB — GLUCOSE, CAPILLARY: Glucose-Capillary: 108 mg/dL — ABNORMAL HIGH (ref 70–99)

## 2022-12-16 SURGERY — IVC FILTER INSERTION
Anesthesia: Moderate Sedation

## 2022-12-16 MED ORDER — FAMOTIDINE 20 MG PO TABS
40.0000 mg | ORAL_TABLET | Freq: Once | ORAL | Status: AC | PRN
  Administered 2022-12-16: 40 mg via ORAL

## 2022-12-16 MED ORDER — HEPARIN SOD (PORK) LOCK FLUSH 100 UNIT/ML IV SOLN: 500.0000 [IU] | Freq: Once | INTRAVENOUS | Status: DC

## 2022-12-16 MED ORDER — FENTANYL CITRATE (PF) 100 MCG/2ML IJ SOLN
INTRAMUSCULAR | Status: DC | PRN
  Administered 2022-12-16: 50 ug via INTRAVENOUS

## 2022-12-16 MED ORDER — METHYLPREDNISOLONE SODIUM SUCC 125 MG IJ SOLR
125.0000 mg | Freq: Once | INTRAMUSCULAR | Status: AC | PRN
  Administered 2022-12-16: 125 mg via INTRAVENOUS

## 2022-12-16 MED ORDER — METHYLPREDNISOLONE SODIUM SUCC 125 MG IJ SOLR
INTRAMUSCULAR | Status: AC
Start: 2022-12-16 — End: ?
  Filled 2022-12-16: qty 2

## 2022-12-16 MED ORDER — HEPARIN SOD (PORK) LOCK FLUSH 100 UNIT/ML IV SOLN
INTRAVENOUS | Status: AC
  Filled 2022-12-16: qty 5

## 2022-12-16 MED ORDER — FAMOTIDINE 20 MG PO TABS
ORAL_TABLET | ORAL | Status: AC
Start: 2022-12-16 — End: ?
  Filled 2022-12-16: qty 2

## 2022-12-16 MED ORDER — HEPARIN (PORCINE) IN NACL 1000-0.9 UT/500ML-% IV SOLN
INTRAVENOUS | Status: DC | PRN
  Administered 2022-12-16: 500 mL

## 2022-12-16 MED ORDER — ONDANSETRON HCL 4 MG/2ML IJ SOLN: 4.0000 mg | Freq: Four times a day (QID) | INTRAMUSCULAR | Status: DC | PRN

## 2022-12-16 MED ORDER — HYDROMORPHONE HCL 1 MG/ML IJ SOLN: 1.0000 mg | Freq: Once | INTRAMUSCULAR | Status: DC | PRN

## 2022-12-16 MED ORDER — MIDAZOLAM HCL 5 MG/5ML IJ SOLN
INTRAMUSCULAR | Status: AC
  Filled 2022-12-16: qty 5

## 2022-12-16 MED ORDER — LIDOCAINE HCL (PF) 1 % IJ SOLN
INTRAMUSCULAR | Status: DC | PRN
  Administered 2022-12-16: 10 mL via INTRADERMAL

## 2022-12-16 MED ORDER — DIPHENHYDRAMINE HCL 50 MG/ML IJ SOLN
50.0000 mg | Freq: Once | INTRAMUSCULAR | Status: AC | PRN
  Administered 2022-12-16: 50 mg via INTRAVENOUS

## 2022-12-16 MED ORDER — MIDAZOLAM HCL 2 MG/ML PO SYRP: 8.0000 mg | ORAL_SOLUTION | Freq: Once | ORAL | Status: DC | PRN

## 2022-12-16 MED ORDER — HEPARIN SODIUM (PORCINE) 1000 UNIT/ML IJ SOLN
INTRAMUSCULAR | Status: AC
  Filled 2022-12-16: qty 10

## 2022-12-16 MED ORDER — MIDAZOLAM HCL 2 MG/2ML IJ SOLN
INTRAMUSCULAR | Status: DC | PRN
  Administered 2022-12-16: 2 mg via INTRAVENOUS

## 2022-12-16 MED ORDER — CEFAZOLIN SODIUM-DEXTROSE 2-4 GM/100ML-% IV SOLN: 2.0000 g | INTRAVENOUS | Status: DC

## 2022-12-16 MED ORDER — IODIXANOL 320 MG/ML IV SOLN
INTRAVENOUS | Status: DC | PRN
  Administered 2022-12-16: 15 mL via INTRAVENOUS

## 2022-12-16 MED ORDER — SODIUM CHLORIDE 0.9 % IV SOLN
INTRAVENOUS | Status: DC
  Administered 2022-12-16: 250 mL via INTRAVENOUS

## 2022-12-16 MED ORDER — FENTANYL CITRATE (PF) 100 MCG/2ML IJ SOLN
INTRAMUSCULAR | Status: AC
  Filled 2022-12-16: qty 2

## 2022-12-16 MED ORDER — DIPHENHYDRAMINE HCL 50 MG/ML IJ SOLN
INTRAMUSCULAR | Status: AC
Start: 2022-12-16 — End: ?
  Filled 2022-12-16: qty 1

## 2022-12-16 SURGICAL SUPPLY — 8 items
COVER PROBE ULTRASOUND 5X96 (MISCELLANEOUS) IMPLANT
KIT FEM OPTION ELITE FILTER (Filter) IMPLANT
NDL ENTRY 21GA 7CM ECHOTIP (NEEDLE) IMPLANT
NEEDLE ENTRY 21GA 7CM ECHOTIP (NEEDLE) ×1
PACK ANGIOGRAPHY (CUSTOM PROCEDURE TRAY) ×1 IMPLANT
SET INTRO CAPELLA COAXIAL (SET/KITS/TRAYS/PACK) IMPLANT
WIRE GUIDERIGHT .035X150 (WIRE) IMPLANT
WIRE SUPRACORE 190CM (WIRE) IMPLANT

## 2022-12-16 NOTE — Op Note (Signed)
[facility]   OPERATIVE NOTE    PRE-OPERATIVE DIAGNOSIS: Hx of DVT with PE; DJD with upcoming total knee replacement  POST-OPERATIVE DIAGNOSIS: Same  PROCEDURE: 1.   Ultrasound guidance for vascular access to the right common femoral vein 2.   Catheter placement into the inferior vena cava 3.   Inferior venacavogram 4.   Placement of a option Elite IVC filter  SURGEON: Levora Dredge  ASSISTANT(S): None  ANESTHESIA: Conscious sedation was administered by the interventional radiology RN under my direct supervision. IV Versed plus fentanyl were utilized. Continuous ECG, pulse oximetry and blood pressure was monitored throughout the entire procedure. Conscious sedation was for a total of 10 minutes.  ESTIMATED BLOOD LOSS: minimal  FINDING(S): 1.  Patent IVC  SPECIMEN(S):  none  INDICATIONS:   Kayla Wu is a 77 y.o. y.o. female who presents with a history of DVT and PE.  She has upcoming total knee replacement and therefore requires cessation of anticoagulation therapy.  Inferior vena cava filter is indicated for this reason.  Risks and benefits including filter thrombosis, migration, fracture, bleeding, and infection were all discussed.  We discussed that all IVC filters that we place can be removed if desired from the patient once the need for the filter has passed.    DESCRIPTION: After obtaining full informed written consent, the patient was brought back to the vascular suite. The skin was sterilely prepped and draped in a sterile surgical field was created. Ultrasound was placed in a sterile sleeve. The right common femoral vein was echolucent and compressible indicating patency. Image was recorded for the permanent record. The puncture was made under continuous real-time ultrasound guidance.  The right common femoral vein was accessed under direct ultrasound guidance without difficulty with a micropuncture needle. Microwire was then advanced under  fluoroscopic guidance without difficulty. Micro-sheath was then inserted and a J-wire was then placed. The dilator is passed over the wire and the delivery sheath was placed into the inferior vena cava.  Inferior venacavogram was performed. This demonstrated a patent IVC with the level of the renal veins at L1.  The filter was then deployed into the inferior vena cava at the level of L2 just below the renal veins. The delivery sheath was then removed. Pressure was held. Sterile dressings were placed. The patient tolerated the procedure well and was taken to the recovery room in stable condition.  Interpretation: Inferior vena cava is opacified the bolus injection contrast.  It is approximately 20 mm in diameter.  The renal blushes are at the L1 level.  The cava is widely patent.  Filter is deployed in an upright position without difficulty.  COMPLICATIONS: None  CONDITION: Stable  Levora Dredge  12/16/2022, 3:32 PM

## 2022-12-16 NOTE — Interval H&P Note (Signed)
History and Physical Interval Note:  12/16/2022 3:02 PM  Kayla Wu  has presented today for surgery, with the diagnosis of IVC filter placement   DVT.  The various methods of treatment have been discussed with the patient and family. After consideration of risks, benefits and other options for treatment, the patient has consented to  Procedure(s): IVC FILTER INSERTION (N/A) as a surgical intervention.  The patient's history has been reviewed, patient examined, no change in status, stable for surgery.  I have reviewed the patient's chart and labs.  Questions were answered to the patient's satisfaction.     Levora Dredge

## 2022-12-16 NOTE — Interval H&P Note (Signed)
History and Physical Interval Note:  12/16/2022 3:27 PM  Kayla Wu  has presented today for surgery, with the diagnosis of IVC filter placement   DVT.  The various methods of treatment have been discussed with the patient and family. After consideration of risks, benefits and other options for treatment, the patient has consented to  Procedure(s): IVC FILTER INSERTION (N/A) as a surgical intervention.  The patient's history has been reviewed, patient examined, no change in status, stable for surgery.  I have reviewed the patient's chart and labs.  Questions were answered to the patient's satisfaction.     Levora Dredge

## 2022-12-16 NOTE — Discharge Instructions (Signed)
Inferior Vena Cava Filter Insertion, Care After The following information offers guidance on how to care for yourself after your procedure. Your health care provider may also give you more specific instructions. If you have problems or questions, contact your health care provider. What can I expect after the procedure? After the procedure, it is common to have: Mild pain and bruising around the area where the long, thin tube (catheter) was inserted in your neck or groin to deliver the filter. Tiredness (fatigue). Follow these instructions at home: Insertion site care Follow instructions from your health care provider about how to take care of your catheter insertion site. Make sure you: Wash your hands with soap and water for at least 20 seconds before and after you change your bandage (dressing). If soap and water are not available, use hand sanitizer. Remove your dressing in 48 hours You may shower tomorrow Keep the dressing and the insertion site clean and dry. Check your insertion site every day for signs of infection. Check for: More redness, swelling, or pain. Fluid or blood. Warmth. Pus or a bad smell. Do not take baths, swim, or use a hot tub for 7 days after your procedure.  Activity: Avoid strenuous exercise or activities that take a lot of effort for 1 week. Do not go back to school or work for 3 days. Avoid any heavy lifting for 1 week.  General instructions If you were given a sedative during the procedure, it can affect you for several hours. Do not drive or operate machinery for 24 hours Take over-the-counter and prescription medicines only as told by your health care provider. Wear compression stockings as told by your health care provider. These stockings help to prevent blood clots and reduce swelling in your legs. Keep all follow-up visits. This is important. Contact a health care provider if: You have any of these signs of infection: More redness, swelling, or pain  around your catheter insertion site. Fluid or blood coming from your insertion site. Warmth coming from your insertion site. Pus or a bad smell coming from your insertion site. A fever. You are dizzy. You have nausea and vomiting. You develop a rash. Get help right away if: You have blood coming from your catheter insertion site (active bleeding). If you have bleeding from the insertion site, lie down, apply pressure to the area with a clean cloth or gauze, and get help right away. You have chest pain, a cough, or difficulty breathing. You have shortness of breath, feel faint, or pass out. You cough up blood. You have severe pain in your abdomen. You develop swelling and discoloration or pain in your legs. Your legs become pale and cold or blue. You have weakness, difficulty moving your arms or legs, or balance problems. You develop problems with speech or vision. These symptoms may be an emergency. Get help right away. Call 911. Do not wait to see if the symptoms will go away. Do not drive yourself to the hospital. Summary Follow instructions from your health care provider about how to take care of your catheter insertion site. After the procedure, it is common to have mild pain and bruising where a catheter was inserted at your neck or groin. Every day, check for signs of infection at the catheter insertion site. Get help right away if you have active bleeding, chest pain, or trouble breathing. This information is not intended to replace advice given to you by your health care provider. Make sure you discuss any questions you  have with your health care provider. Document Revised: 02/05/2021 Document Reviewed: 01/19/2019 Elsevier Patient Education  2023 [employer].

## 2022-12-17 ENCOUNTER — Encounter: Payer: Self-pay | Admitting: Vascular Surgery

## 2022-12-17 DIAGNOSIS — H2513 Age-related nuclear cataract, bilateral: Secondary | ICD-10-CM | POA: Diagnosis not present

## 2022-12-17 DIAGNOSIS — H35033 Hypertensive retinopathy, bilateral: Secondary | ICD-10-CM | POA: Diagnosis not present

## 2022-12-17 DIAGNOSIS — Z0100 Encounter for examination of eyes and vision without abnormal findings: Secondary | ICD-10-CM | POA: Diagnosis not present

## 2022-12-17 DIAGNOSIS — H524 Presbyopia: Secondary | ICD-10-CM | POA: Diagnosis not present

## 2022-12-18 ENCOUNTER — Encounter: Payer: Self-pay | Admitting: Cardiovascular Disease

## 2022-12-18 ENCOUNTER — Encounter: Payer: Self-pay | Admitting: Vascular Surgery

## 2022-12-18 LAB — URINE CULTURE: Culture: >=100000 — AB

## 2022-12-18 NOTE — Progress Notes (Signed)
  [facility] Perioperative Services: Pre-Admission/Anesthesia Testing  Abnormal Lab Notification   Date: 12/18/22  Name: Kayla Wu MRN:   [id number]  Re: Abnormal labs noted during PAT appointment   Notified:  Provider Name Provider Role Notification Mode  Francesco Sor, MD Orthopedics (Surgeon) Routed and/or faxed via [facility]   Abnormal Lab Value(s):   Lab Results  Component Value Date   COLORURINE AMBER (A) 12/15/2022   APPEARANCEUR HAZY (A) 12/15/2022   LABSPEC 1.030 12/15/2022   PHURINE 5.0 12/15/2022   GLUCOSEU 50 (A) 12/15/2022   HGBUR NEGATIVE 12/15/2022   BILIRUBINUR NEGATIVE 12/15/2022   KETONESUR 5 (A) 12/15/2022   PROTEINUR 30 (A) 12/15/2022   NITRITE NEGATIVE 12/15/2022   LEUKOCYTESUR MODERATE (A) 12/15/2022   EPIU 0-5 12/15/2022   WBCU >50 12/15/2022   RBCU 0-5 12/15/2022   BACTERIA RARE (A) 12/15/2022   CULT >=100,000 COLONIES/mL ESCHERICHIA COLI (A) 12/15/2022   Clinical Information and Notes:  Patient is scheduled for COMPUTER ASSISTED TOTAL KNEE ARTHROPLASTY (Left: Knee) on 12/22/2022.    UA performed in PAT consistent with/concerning for infection.  (+) leukocytosis noted on CBC; WBC 12.0 Renal function: Cr 0..9 mg/dL on 02/72/5366 Urine C&S added to assess for pathogenically significant growth.   Impression and Plan:  [name] with a UA that was (+) for infection; reflex culture sent. Preliminary culture (+) for significant E.coli colony count; final susceptibilities pending. Will plan on forwarding final culture results to MD as they become available to me. Sending results for review and consideration of preoperative treatment as deemed appropriate by Dr. Ernest Pine.   Quentin Mulling, MSN, APRN, FNP-C, CEN [facility]  Perioperative Services Nurse Practitioner Phone: [phone] Fax: [phone] 12/18/22 8:17 AM  NOTE: This note has been prepared using Dragon dictation software. Despite my  best ability to proofread, there is always the potential that unintentional transcriptional errors may still occur from this process.

## 2022-12-18 NOTE — Progress Notes (Signed)
  Perioperative Services Pre-Admission/Anesthesia Testing    Date: 12/18/22  Name: Kayla Wu MRN:   [id number]  Re: GLP-1 clearance and provider recommendations   Planned Surgical Procedure(s):    Case: [id number] Date/Time: 12/22/22 0700   Procedure: COMPUTER ASSISTED TOTAL KNEE ARTHROPLASTY (Left: Knee)   Anesthesia type: Choice   Pre-op diagnosis: Primary osteoarthritis of left knee M17.12   Location: [facility] OR ROOM 01 / [facility] ANEST[facility]   Surgeons: Donato Heinz, MD      Clinical Notes:  Patient is scheduled for the above procedure with the indicated provider/surgeon. In review of her medication reconciliation it was noted that patient is on a prescribed GLP-1 medication. Per guidelines issued by the American Society of Anesthesiologists (ASA), it is recommended that these medications be held for 7 days prior to the patient undergoing any type of elective surgical procedure. The patient is taking the following GLP-1 medication:  [x]  SEMAGLUTIDE   []  EXENATIDE  []  LIRAGLUTIDE   []  LIXISENATIDE  []  DULAGLUTIDE     []  TIRZEPATIDE (GLP-1/GIP)  Reached out to prescribing provider Kayla Sheen, MD) to make them aware of the guidelines from anesthesia. Given that this patient takes the prescribed GLP-1 medication for her  diabetes diagnosis, rather than for weight loss, recommendations from the prescribing provider were solicited. Prescribing provider made aware of the following so that informed decision/POC can be developed for this patient that may be taking medications belonging to these drug classes:  Oral GLP-1 medications will be held 1 day prior to surgery.  Injectable GLP-1 medications will be held 7 days prior to surgery.  Metformin is routinely held 48 hours prior to surgery due to renal concerns, potential need for contrasted imaging perioperatively, and the potential for tissue hypoxia leading to drug induced lactic acidosis.  All SGLT2i medications are  held 72 hours prior to surgery as they can be associated with the increased potential for developing euglycemic diabetic ketoacidosis (EDKA).   Impression and Plan:  Kayla Wu is on a prescribed GLP-1 medication, which induces the known side effect of decreased gastric emptying. Efforts are bring made to mitigate the risk of perioperative hyperglycemic events, as elevated blood glucose levels have been found to contribute to intra/postoperative complications. Additionally, hyperglycemic extremes can potentially necessitate the postponing of a patient's elective case in order to better optimize perioperative glycemic control, again with the aforementioned guidelines in place. With this in mind, recommendations have been sought from the prescribing provider, who has cleared patient to proceed with holding the prescribed GLP-1 as per the guidelines from the ASA.   Provider recommending: no further recommendations received from the prescribing provider.  Copy of signed clearance and recommendations placed on patient's chart for inclusion in their medical record and for review by the surgical/anesthetic team on the day of her procedure.   Quentin Mulling, MSN, APRN, FNP-C, CEN [facility]  Perioperative Services Nurse Practitioner Phone: [phone] Fax: [phone] 12/18/22 8:09 AM  NOTE: This note has been prepared using Dragon dictation software. Despite my best ability to proofread, there is always the potential that unintentional transcriptional errors may still occur from this process.

## 2022-12-19 ENCOUNTER — Encounter: Payer: Self-pay | Admitting: Orthopedic Surgery

## 2022-12-19 NOTE — Progress Notes (Signed)
Perioperative / Anesthesia Services  Pre-Admission Testing Clinical Review / Pre-Operative Anesthesia Consult  Date: 12/19/22  Patient Demographics:  Name: Kayla Wu DOB:   September 26, 1945 MRN:   [id number]  Planned Surgical Procedure(s):    Case: [id number] Date/Time: 12/22/22 0700   Procedure: COMPUTER ASSISTED TOTAL KNEE ARTHROPLASTY (Left: Knee)   Anesthesia type: Choice   Pre-op diagnosis: Primary osteoarthritis of left knee M17.12   Location: [facility] OR ROOM 01 / [facility]   Surgeons: Donato Heinz, MD     NOTE: Available PAT nursing documentation and vital signs have been reviewed. Clinical nursing staff has updated patient's PMH/PSHx, current medication list, and drug allergies/intolerances to ensure comprehensive history available to assist in medical decision making as it pertains to the aforementioned surgical procedure and anticipated anesthetic course. Extensive review of available clinical information personally performed. [place] PMH and PSHx updated with any diagnoses/procedures that  may have been inadvertently omitted during her intake with the pre-admission testing department's nursing staff.  Clinical Discussion:  Kayla Wu is a 77 y.o. female who is submitted for pre-surgical anesthesia review and clearance prior to her undergoing the above procedure. Patient is a Former Smoker (84 pack years; quit 07/2007). Pertinent PMH includes: CAD, PAF, diastolic dysfunction, VTE, aortic atherosclerosis, chest pain, HTN, HLD, T2DM, COPD, GERD (on daily PPI), nocturnal hypoxia (on supplemental oxygen), remote ovarian cancer, OA, lumbar DDD, depression, anxiety (on BZO), insomnia (on hypnotic).  Patient is followed by cardiology Mariah Milling, MD). She was last seen in the cardiology clinic on 10/14/2022; notes reviewed. At the time of her clinic visit, patient doing well overall from a cardiovascular perspective. Patient denied any chest pain, shortness of  breath, PND, orthopnea, palpitations, significant peripheral edema, weakness, fatigue, vertiginous symptoms, or presyncope/syncope. Patient with a past medical history significant for cardiovascular diagnoses. Documented physical exam was grossly benign, providing no evidence of acute exacerbation and/or decompensation of the patient's known cardiovascular conditions.  Myocardial perfusion imaging study was performed on 07/31/2014 revealing a normal left ventricular systolic function with a hyperdynamic LVEF of 75%.  There were no regional wall motion abnormalities.  SPECT images demonstrated no evidence of stress-induced myocardial ischemia or arrhythmia; no scintigraphic evidence of scar. TID ratio 0.79.  Study determined to be normal and low risk.  Most recent TTE performed on 06/29/2020 revealed a normal left ventricular systolic function with an EF of 60 to 65%.  There were no regional wall motion abnormalities. Left ventricular diastolic Doppler parameters consistent with abnormal relaxation (G1DD).  Right ventricular size and function was normal.  RVSP 14.9 mmHg. All transvalvular gradients were noted to be normal providing no evidence suggestive of valvular stenosis. Aorta normal in size with no evidence of aneurysmal dilatation.  Coronary CTA was performed on 09/16/2021 that demonstrated an Agatston coronary artery calcium score of 340. This placed patient in the 79th percentile for age, sex, and race matched controls. Calcium depositions noted to be isolated mainly in the proximal LAD (25-49%) and proximal LCx (<25%) distributions.  Study demonstrates normal coronary origin with LEFT dominance.  Patient diagnosed with atrial fibrillation in 06/2020 in the setting of a concurrent SARS-CoV-2 infection; CHA2DS2-VASc Score = 8 (age x 2, sex, HTN, VTE x 2, vascular disease history, T2DM). Her rate and rhythm are currently being maintained on oral diltiazem. She is chronically anticoagulated using  rivaroxaban; reported to be compliant with therapy with no evidence or reports of GI/GU bleeding. Blood pressure reasonably controlled at 130/60 mmHg on  currently prescribed beta-blocker (bisoprolol) and ARB (losartan) therapies.  Patient is on rosuvastatin for her HLD diagnosis and ASCVD prevention. T2DM well controlled on currently prescribed regimen; last HgbA1c was 6.2% when checked on 07/03/2022. Since patient was last seen by her cardiologist, A1c level has been rechecked with further improvement down to 5.8% on 11/25/2022.  Patient does not have an OSAH diagnosis, however she does experience nocturnal hypoxia requiring supplemental oxygen (1-2 L/[place]).  Functional capacity somewhat limited by patient's age and multiple medical comorbidities.  She has arthritides that limit her movement to some extent.  With that said, patient still able to complete her ADLs/IADLs independently without significant cardiovascular limitation.  Patient felt to be able to achieve at least 4 METS of physical activity without experiencing any significant degrees of angina/anginal equivalent symptoms. No changes were made to her medication regimen during her visit with cardiology.  Patient scheduled to follow-up with outpatient cardiology in 1 year or sooner if needed.  Kayla Wu is scheduled for an elective COMPUTER ASSISTED TOTAL KNEE ARTHROPLASTY (Left: Knee) on 12/22/2022 with Dr. Francesco Sor, MD.  Given patient's past medical history significant for cardiovascular diagnoses, presurgical cardiac clearance was sought by the PAT team.  Per cardiology, "patient was seen in the office by Dr. Mariah Milling on 10/14/2022 therefore virtual visit for preoperative evaluation is not needed. I called her to verify that she has had no concerning changes in her medical history and remains active with no concerning symptoms. She is able to achieve > 4 METS activity without concerning symptoms. Her RCRI for MACE is low at 0.4%,  therefore  according to [facility] guidelines, she can proceed without further testing".  Again, this patient is on daily oral anticoagulation therapy using a DOAC medication.  She has been instructed on recommendations for holding her rivaroxaban for 3 days prior to her procedure with plans to restart as soon as postoperative bleeding risk felt to be minimized by her attending surgeon. The patient has been instructed that her last dose of her rivaroxaban should be on 12/18/2022.  Patient denies previous perioperative complications with anesthesia in the past. In review of the available records, it is noted that patient underwent a general anesthetic course here at [facility] ([facility] in 06/2022 without documented complications.      12/16/2022    4:45 PM 12/16/2022    4:30 PM 12/16/2022    4:15 PM  Vitals with BMI  Systolic 143 145 244  Diastolic 59 54 56  Pulse 61 60 61    Providers/Specialists:   NOTE: Primary physician provider listed below. Patient may have been seen by APP or partner within same practice.   PROVIDER ROLE / SPECIALTY LAST OV  Hooten, Illene Labrador, MD Orthopedics (Surgeon) 12/15/2022  Marguarite Arbour, MD Primary Care Provider 11/25/2022  Julien Nordmann, MD Cardiology 10/14/2022; update call with preop APP on 11/18/2022  Levora Dredge, MD Vascular Surgery 11/27/2022   Allergies:  Iodinated contrast media and Iohexol  Current Home Medications:   No current facility-administered medications for this encounter.    acetaminophen (TYLENOL) 500 MG tablet   bisoprolol (ZEBETA) 5 MG tablet   diltiazem (CARDIZEM CD) 180 MG 24 hr capsule   gabapentin (NEURONTIN) 600 MG tablet   HYDROcodone-acetaminophen (NORCO) 10-325 MG tablet   hydrOXYzine (ATARAX) 50 MG tablet   insulin NPH-regular Human (70-30) 100 UNIT/ML injection   LORazepam (ATIVAN) 1 MG tablet   losartan (COZAAR) 100 MG tablet   magnesium  oxide (MAG-OX) 400 MG tablet   metFORMIN  (GLUCOPHAGE-XR) 500 MG 24 hr tablet   omeprazole (PRILOSEC) 40 MG capsule   rivaroxaban (XARELTO) 20 MG TABS tablet   rosuvastatin (CRESTOR) 10 MG tablet   Semaglutide, 2 MG/DOSE, (OZEMPIC, 2 MG/DOSE,) 8 MG/3ML SOPN   senna (SENOKOT) 8.6 MG TABS tablet   venlafaxine XR (EFFEXOR-XR) 150 MG 24 hr capsule   venlafaxine XR (EFFEXOR-XR) 37.5 MG 24 hr capsule   vitamin B-12 (CYANOCOBALAMIN) 1000 MCG tablet   bisacodyl (DULCOLAX) 5 MG EC tablet   Camphor-Eucalyptus-Menthol ([employer] MEDICATED CHEST RUB EX)   Emollient (LUBRIDERM) LOTN   ferrous sulfate 325 (65 FE) MG EC tablet   ibuprofen (ADVIL) 200 MG tablet   Polyethylene Glycol 3350 (MIRALAX PO)   zolpidem (AMBIEN) 10 MG tablet   History:   Past Medical History:  Diagnosis Date   Anxiety    a.) on BZO PRN (lorazepam)   Aortic atherosclerosis (HCC)    CAD (coronary artery disease)    a.) MV 05/23/2014: EF 75%, no ischemia/artifact; b.) cCTA 09/16/2021: Ca2+ 340 (79th %ile) in pLAD and pLCx distributions   Chronic hypoxemic respiratory failure (HCC)    Chronic urticaria    COPD (chronic obstructive pulmonary disease) (HCC)    DDD (degenerative disc disease), lumbar    Depression    Diastolic dysfunction    a.) TTE 05/23/2014: EF >55%, no RWMAs, G1DD, triv AR, mild MR, RVSP 37.1; b.) TTE 01/19/2018: EF >55%, mild LVH, no RWMAs, G1DD, mild LAE, triv AR/MR/PR, mild TR, RVSP 36.1; c.) TTE 06/29/2020: EF 60-65%, no RWMAs, G1DD, RVSP 14.9   GERD (gastroesophageal reflux disease)    Hepatic steatosis    History of 2019 novel coronavirus disease (COVID-19) 06/25/2020   HLD (hyperlipidemia)    Hypertension    Insomnia    a.) on hypnotic (zolpidem)   Menopausal state    Morbid obesity (HCC)    Non-cardiac chest pain    On rivaroxaban therapy    Osteoarthritis    Ovarian cancer (HCC) 2012   a.) s/p Tx with systemic chemotherapy   Oxygen desaturation during sleep    a.) on 1-2 L/[place] supplemental oxygen while sleeping   PAF (paroxysmal  atrial fibrillation) (HCC) 06/2020   a.) Dx'd in setting of SARS-CoV-2 infection; b.) CHA2DS2-VASc = 8 (age x2, sex, HTN, VTE x2, vascular disease history, T2DM) as of 12/19/2022; c.) cardiac rate/rhythm maintained on oral diltiazem; chronically anticoagulated using rivaroxaban   Pneumonia 1987   Rectovaginal fistula    Sleep apnea    a.) does not utilize nocturnal PAP therapy   T2DM (type 2 diabetes mellitus) (HCC)    VTE (venous thromboembolism) 06/2020   a.) developed in setting of SARS-CoV-2 infection and A.fib with RVR   Past Surgical History:  Procedure Laterality Date   ABDOMINAL HYSTERECTOMY     APPENDECTOMY     CHOLECYSTECTOMY     ESOPHAGOGASTRODUODENOSCOPY (EGD) WITH PROPOFOL N/A 06/28/2021   Procedure: ESOPHAGOGASTRODUODENOSCOPY (EGD) WITH PROPOFOL;  Surgeon: Regis Bill, MD;  Location: [facility] ENDOSCOPY;  Service: Endoscopy;  Laterality: N/A;  DM, ON WARFARIN   FINGER ARTHROPLASTY Left 12/22/2014   Procedure: FINGER ARTHROPLASTY;  Surgeon: Myra Rude, MD;  Location: [facility] ORS;  Service: Orthopedics;  Laterality: Left;   IVC FILTER INSERTION N/A 12/16/2022   Procedure: IVC FILTER INSERTION;  Surgeon: Renford Dills, MD;  Location: [facility] INVASIVE CV LAB;  Service: Cardiovascular;  Laterality: N/A;   KNEE ARTHROSCOPY WITH SUBCHONDROPLASTY Left 06/27/2022   Procedure: Left  knee arthroscopic chondroplasty, possible partial meniscectomy, and subchondroplasty to the medial femoral condyle;  Surgeon: Signa Kell, MD;  Location[facility];  Service: Orthopedics;  Laterality: Left;  Diabetic   Family History  Problem Relation Age of Onset   Breast cancer Mother 56   Emphysema Father        smoked   Colon cancer Sister    Uterine cancer Sister    Throat cancer Brother    Social History   Tobacco Use   Smoking status: Former    Current packs/day: 0.00    Average packs/day: 2.0 packs/day for 42.0 years (84.0 ttl pk-yrs)    Types: Cigarettes    Start  date: 07/04/1965    Quit date: 07/05/2007    Years since quitting: 15.4   Smokeless tobacco: Never  Vaping Use   Vaping status: Never Used  Substance Use Topics   Alcohol use: No    Comment: occasional glass of wine once a month   Drug use: No    Pertinent Clinical Results:  LABS:   Component Ref Range & Units 11/25/2022  WBC (White Blood Cell Count) 4.1 - 10.2 10^3/uL 9.7  RBC (Red Blood Cell Count) 4.04 - 5.48 10^6/uL 3.73 Low   Hemoglobin 12.0 - 15.0 gm/dL 23.5 Low   Hematocrit 36.1 - 47.0 % 35.2  MCV (Mean Corpuscular Volume) 80.0 - 100.0 fl 94.4  MCH (Mean Corpuscular Hemoglobin) 27.0 - 31.2 pg 31.4 High   MCHC (Mean Corpuscular Hemoglobin Concentration) 32.0 - 36.0 gm/dL 44.3  Platelet Count 154 - 450 10^3/uL 383  RDW-CV (Red Cell Distribution Width) 11.6 - 14.8 % 12.9  MPV (Mean Platelet Volume) 9.4 - 12.4 fl 8.8 Low   Neutrophils 1.50 - 7.80 10^3/uL 5.61  Lymphocytes 1.00 - 3.60 10^3/uL 2.88  Monocytes 0.00 - 1.50 10^3/uL 0.88  Eosinophils 0.00 - 0.55 10^3/uL 0.27  Basophils 0.00 - 0.09 10^3/uL 0.09  Neutrophil % 32.0 - 70.0 % 57.6  Lymphocyte % 10.0 - 50.0 % 29.6  Monocyte % 4.0 - 13.0 % 9  Eosinophil % 1.0 - 5.0 % 2.8  Basophil% 0.0 - 2.0 % 0.9  Immature Granulocyte % <=0.7 % 0.1  Immature Granulocyte Count <=0.06 10^3/L 0.01  Resulting Agency [facility] - [facility]  Specimen Collected: 11/25/22 15:34   Performed by: [facility] Last Resulted: 11/25/22 16:29  Received From: [facility]  Result Received: 12/02/22 13:35   Component Ref Range & Units 11/25/2022  Glucose 70 - 110 mg/dL 008  Sodium 676 - 195 mmol/L 137  Potassium 3.6 - 5.1 mmol/L 4.9  Chloride 97 - 109 mmol/L 102  Carbon Dioxide (CO2) 22.0 - 32.0 mmol/L 27.5  Urea Nitrogen (BUN) 7 - 25 mg/dL 21  Creatinine 0.6 - 1.1 mg/dL 0.9  Glomerular Filtration Rate (eGFR) >60 mL/min/1.73sq m 65  Calcium 8.7 - 10.3 mg/dL 8.9  AST 8 - 39  U/L 11  ALT 5 - 38 U/L 8  Alk Phos (alkaline Phosphatase) 34 - 104 U/L 90  Albumin 3.5 - 4.8 g/dL 4.1  Bilirubin, Total 0.3 - 1.2 mg/dL 0.3  Protein, Total 6.1 - 7.9 g/dL 6.5  A/G Ratio 1.0 - 5.0 gm/dL 1.7  Resulting Agency [facility]  Specimen Collected: 11/25/22 15:34   Performed by: [facility] Last Resulted: 11/26/22 10:05  Received From: [facility]  Result Received: 12/02/22 13:35     ECG: Date: 10/14/2022 Time ECG obtained:  1520 PM Rate: 53 bpm Rhythm: sinus bradycardia Axis (leads I and aVF): Normal Intervals: PR 158 ms. QRS 86 ms. QTc 433 ms. ST segment and T wave changes: No evidence of acute ST segment elevation or depression.   Comparison: Similar to previous tracing obtained on 09/03/2021   IMAGING / PROCEDURES: DG KNEE 3 VIEWS LEFT performed on 06/27/2022 Significant narrowing of the medial cartilage space with bone-on-bone articulation and associated varus alignment.   Osteophyte formation is noted.  Subchondral sclerosis is noted.   No evidence of fracture or dislocation.   CT CORONARY MORPH W/CTA COR W/SCORE W/CA W/CM &/OR WO/CM performed on 09/16/2021 No active cardiopulmonary abnormalities. Stable appearance of pleuroparenchymal scarring within the lateral right lung base. There is a 5 mm subpleural nodule within the lateral left lower lobe which is unchanged from 07/19/2021 but not confidently identified on more remote studies. No follow-up needed if patient is low-risk.This recommendation follows the consensus statement: Guidelines for Management of Incidental Pulmonary Nodules Detected on CT Images: From the Fleischner Society 2017; Radiology 2017; 284:228-243. Coronary calcium score of 340. This was 79th percentile for age and sex matched control. Normal coronary origin with Left dominance. Calcified plaque in the proximal LAD causing mild stenosis (25-49%). Minimal proximal LCx stenosis  (<25%). CAD-RADS 2. Mild non-obstructive CAD (25-49%). Consider non-atherosclerotic causes of chest pain. Consider preventive therapy and risk factor modification.  CT ANGIO CHEST PULMONARY EMBOLISM (PE) W OR WO CONTRAST performed on 07/19/2021 There is no evidence of acute central pulmonary artery embolism. Evaluation of small peripheral branches is limited by motion artifacts. There is small linear calcific density in the margin of one of the subsegmental branches in the left lower lobe which may suggest calcification related to previous episode of pulmonary embolism or granuloma in the lung immediately adjacent to the pulmonary artery branch. There is 6 mm nodular density in the left lower lobe which appears stable. There is questionable new 7 mm subpleural faint nodular density in the left lower lobe. Non-contrast chest CT at 3-6 months is recommended. If the nodules are stable at time of repeat CT, then future CT at 18-24 months (from today's scan) is considered optional for low-risk patients, but is recommended for high-risk patients. This recommendation follows the consensus statement: Guidelines for Management of Incidental Pulmonary Nodules Detected on CT Images: From the Fleischner Society 2017; Radiology 2017; 284:228-243. There is no focal pulmonary consolidation.  There is no pleural effusion.  There are scattered coronary artery calcifications.  MR LUMBAR SPINE WO CONTRAST performed on 03/20/2021 Lumbar spondylosis At L4-L5, there is multifactorial moderate left subarticular narrowing with potential to affect the descending left L5 nerve root. Mild relative narrowing of the central canal. Bilateral neural foraminal narrowing (mild right, severe left). At L5-S1, there is multifactorial bilateral neural foraminal narrowing (severe right, moderate left). There is some apparent  impingement of the exiting left L5 nerve root. No more than mild spinal canal stenosis at the remaining levels.  Additional sites of foraminal stenosis, as detailed and greatest bilaterally at L3-L4 (moderate at this level). Disc degeneration is greatest at L2-L3, L4-L5 and L5-S1 (moderate at these levels). Multilevel degenerative endplate edema, greatest at L4-L5.  TRANSTHORACIC ECHOCARDIOGRAM performed on 06/29/2020 Left ventricular ejection fraction, by estimation, is 60 to 65%. The left ventricle has normal function. The left ventricle has no regional  wall motion abnormalities. Left ventricular diastolic parameters are consistent with Grade I diastolic dysfunction (impaired relaxation).  Right ventricular systolic function is normal. The right  ventricular size is normal. There is normal pulmonary artery systolic pressure. The  estimated right ventricular systolic pressure is 14.9 mmHg.   Impression and Plan:  Kayla Wu has been referred for pre-anesthesia review and clearance prior to her undergoing the planned anesthetic and procedural courses. Available labs, pertinent testing, and imaging results were personally reviewed by me in preparation for upcoming operative/procedural course. [facility] medical record has been updated following extensive record review and patient interview with PAT staff.   This patient has been appropriately cleared by cardiology with an overall ACCEPTABLE risk of experiencing significant perioperative cardiovascular complications. Based on clinical review performed today (12/19/22), barring any significant acute changes in the patient's overall condition, it is anticipated that she will be able to proceed with the planned surgical intervention. Any acute changes in clinical condition may necessitate her procedure being postponed and/or cancelled. Patient will meet with anesthesia team (MD and/or CRNA) on the day of her procedure for preoperative evaluation/assessment. Questions regarding anesthetic course will be fielded at that time.   Pre-surgical instructions were  reviewed with the patient during her PAT appointment, and questions were fielded to satisfaction by PAT clinical staff. She has been instructed on which medications that she will need to hold prior to surgery, as well as the ones that have been deemed safe/appropriate to take on the day of her procedure. As part of the general education provided by PAT, patient made aware both verbally and in writing, that she would need to abstain from the use of any illegal substances during her perioperative course.  She was advised that failure to follow the provided instructions could necessitate case cancellation or result in serious perioperative complications up to and including death. Patient encouraged to contact PAT and/or her surgeon's office to discuss any questions or concerns that may arise prior to surgery; verbalized understanding.   Quentin Mulling, MSN, APRN, FNP-C, CEN [facility]  Perioperative Services Nurse Practitioner Phone: [phone] Fax: [phone] 12/19/22 2:54 PM  NOTE: This note has been prepared using Dragon dictation software. Despite my best ability to proofread, there is always the potential that unintentional transcriptional errors may still occur from this process.

## 2022-12-22 ENCOUNTER — Encounter: Payer: Self-pay | Admitting: Orthopedic Surgery

## 2022-12-22 ENCOUNTER — Encounter
Admission: RE | Disposition: A | Discharge status: Home / Self Care | Payer: Self-pay | Source: Ambulatory Visit | Attending: Orthopedic Surgery

## 2022-12-22 ENCOUNTER — Observation Stay
Admission: RE | Admit: 2022-12-22 | Discharge: 2022-12-23 | Disposition: A | Discharge status: Home / Self Care | Payer: Medicare HMO | Source: Ambulatory Visit | Attending: Orthopedic Surgery | Admitting: Orthopedic Surgery

## 2022-12-22 ENCOUNTER — Ambulatory Visit: Payer: Medicare HMO | Admitting: Urgent Care

## 2022-12-22 ENCOUNTER — Observation Stay: Payer: Medicare HMO

## 2022-12-22 ENCOUNTER — Other Ambulatory Visit: Payer: Self-pay

## 2022-12-22 DIAGNOSIS — J449 Chronic obstructive pulmonary disease, unspecified: Secondary | ICD-10-CM | POA: Insufficient documentation

## 2022-12-22 DIAGNOSIS — M1712 Unilateral primary osteoarthritis, left knee: Principal | ICD-10-CM | POA: Insufficient documentation

## 2022-12-22 DIAGNOSIS — Z471 Aftercare following joint replacement surgery: Secondary | ICD-10-CM | POA: Diagnosis not present

## 2022-12-22 DIAGNOSIS — Z7901 Long term (current) use of anticoagulants: Secondary | ICD-10-CM | POA: Diagnosis not present

## 2022-12-22 DIAGNOSIS — I480 Paroxysmal atrial fibrillation: Secondary | ICD-10-CM | POA: Diagnosis not present

## 2022-12-22 DIAGNOSIS — I700 Atherosclerosis of aorta: Secondary | ICD-10-CM | POA: Insufficient documentation

## 2022-12-22 DIAGNOSIS — Z86711 Personal history of pulmonary embolism: Secondary | ICD-10-CM | POA: Diagnosis not present

## 2022-12-22 DIAGNOSIS — Z96652 Presence of left artificial knee joint: Secondary | ICD-10-CM

## 2022-12-22 DIAGNOSIS — Z794 Long term (current) use of insulin: Secondary | ICD-10-CM | POA: Insufficient documentation

## 2022-12-22 DIAGNOSIS — Z8543 Personal history of malignant neoplasm of ovary: Secondary | ICD-10-CM | POA: Insufficient documentation

## 2022-12-22 DIAGNOSIS — I10 Essential (primary) hypertension: Secondary | ICD-10-CM | POA: Insufficient documentation

## 2022-12-22 DIAGNOSIS — Z7984 Long term (current) use of oral hypoglycemic drugs: Secondary | ICD-10-CM | POA: Diagnosis not present

## 2022-12-22 DIAGNOSIS — M25462 Effusion, left knee: Secondary | ICD-10-CM | POA: Diagnosis not present

## 2022-12-22 DIAGNOSIS — E119 Type 2 diabetes mellitus without complications: Secondary | ICD-10-CM | POA: Diagnosis not present

## 2022-12-22 DIAGNOSIS — Z87891 Personal history of nicotine dependence: Secondary | ICD-10-CM | POA: Diagnosis not present

## 2022-12-22 DIAGNOSIS — Z79899 Other long term (current) drug therapy: Secondary | ICD-10-CM | POA: Diagnosis not present

## 2022-12-22 DIAGNOSIS — Z8616 Personal history of COVID-19: Secondary | ICD-10-CM | POA: Diagnosis not present

## 2022-12-22 HISTORY — DX: Unspecified osteoarthritis, unspecified site: M19.90

## 2022-12-22 HISTORY — DX: Atherosclerosis of aorta: I70.0

## 2022-12-22 HISTORY — DX: Depression, unspecified: F32.A

## 2022-12-22 HISTORY — DX: Anxiety disorder, unspecified: F41.9

## 2022-12-22 HISTORY — DX: Hyperlipidemia, unspecified: E78.5

## 2022-12-22 HISTORY — DX: Insomnia, unspecified: G47.00

## 2022-12-22 HISTORY — DX: Long term (current) use of anticoagulants: Z79.01

## 2022-12-22 HISTORY — DX: Atherosclerotic heart disease of native coronary artery without angina pectoris: I25.10

## 2022-12-22 HISTORY — PX: KNEE ARTHROPLASTY: SHX992

## 2022-12-22 HISTORY — DX: Type 2 diabetes mellitus without complications: E11.9

## 2022-12-22 HISTORY — DX: Other intervertebral disc degeneration, lumbar region without mention of lumbar back pain or lower extremity pain: M51.369

## 2022-12-22 LAB — GLUCOSE, CAPILLARY
Glucose-Capillary: 117 mg/dL — ABNORMAL HIGH (ref 70–99)
Glucose-Capillary: 173 mg/dL — ABNORMAL HIGH (ref 70–99)
Glucose-Capillary: 169 mg/dL — ABNORMAL HIGH (ref 70–99)
Glucose-Capillary: 128 mg/dL — ABNORMAL HIGH (ref 70–99)

## 2022-12-22 SURGERY — ARTHROPLASTY, KNEE, TOTAL, USING IMAGELESS COMPUTER-ASSISTED NAVIGATION
Anesthesia: General | Site: Knee | Laterality: Left

## 2022-12-22 MED ORDER — GABAPENTIN 300 MG PO CAPS
ORAL_CAPSULE | ORAL | Status: AC
  Filled 2022-12-22: qty 2

## 2022-12-22 MED ORDER — FLEET ENEMA RE ENEM: 1.0000 | ENEMA | Freq: Once | RECTAL | Status: DC | PRN

## 2022-12-22 MED ORDER — METOCLOPRAMIDE HCL 10 MG PO TABS
10.0000 mg | ORAL_TABLET | Freq: Three times a day (TID) | ORAL | Status: DC
  Administered 2022-12-22 – 2022-12-23 (×3): 10 mg via ORAL

## 2022-12-22 MED ORDER — BISOPROLOL FUMARATE 5 MG PO TABS
5.0000 mg | ORAL_TABLET | Freq: Every day | ORAL | Status: DC
Start: 2022-12-23 — End: 2022-12-23
  Administered 2022-12-23: 5 mg via ORAL
  Filled 2022-12-22: qty 1

## 2022-12-22 MED ORDER — PROPOFOL 1000 MG/100ML IV EMUL
INTRAVENOUS | Status: AC
  Filled 2022-12-22: qty 100

## 2022-12-22 MED ORDER — ACETAMINOPHEN 10 MG/ML IV SOLN
INTRAVENOUS | Status: DC | PRN
  Administered 2022-12-22: 1000 mg via INTRAVENOUS

## 2022-12-22 MED ORDER — OXYCODONE HCL 5 MG PO TABS: 5.0000 mg | ORAL_TABLET | ORAL | Status: DC | PRN

## 2022-12-22 MED ORDER — TRAMADOL HCL 50 MG PO TABS
ORAL_TABLET | ORAL | Status: AC
  Filled 2022-12-22: qty 2

## 2022-12-22 MED ORDER — INSULIN ASPART 100 UNIT/ML IJ SOLN
INTRAMUSCULAR | Status: AC
  Filled 2022-12-22: qty 1

## 2022-12-22 MED ORDER — HYDROXYZINE HCL 50 MG PO TABS
50.0000 mg | ORAL_TABLET | Freq: Every evening | ORAL | Status: DC | PRN
  Filled 2022-12-22 (×2): qty 1

## 2022-12-22 MED ORDER — METOCLOPRAMIDE HCL 10 MG PO TABS
ORAL_TABLET | ORAL | Status: AC
  Filled 2022-12-22: qty 1

## 2022-12-22 MED ORDER — CELECOXIB 200 MG PO CAPS
ORAL_CAPSULE | ORAL | Status: AC
  Filled 2022-12-22: qty 2

## 2022-12-22 MED ORDER — ACETAMINOPHEN 10 MG/ML IV SOLN
INTRAVENOUS | Status: AC
  Filled 2022-12-22: qty 100

## 2022-12-22 MED ORDER — PROPOFOL 10 MG/ML IV BOLUS
INTRAVENOUS | Status: AC
  Filled 2022-12-22: qty 20

## 2022-12-22 MED ORDER — GABAPENTIN 300 MG PO CAPS
ORAL_CAPSULE | ORAL | Status: AC
  Filled 2022-12-22: qty 1

## 2022-12-22 MED ORDER — FERROUS SULFATE 325 (65 FE) MG PO TABS
325.0000 mg | ORAL_TABLET | Freq: Three times a day (TID) | ORAL | Status: DC
  Administered 2022-12-22 – 2022-12-23 (×2): 325 mg via ORAL

## 2022-12-22 MED ORDER — SODIUM CHLORIDE 0.9 % IR SOLN
Status: DC | PRN
  Administered 2022-12-22: 3000 mL

## 2022-12-22 MED ORDER — OXYCODONE HCL 5 MG/5ML PO SOLN: 5.0000 mg | Freq: Once | ORAL | Status: DC | PRN

## 2022-12-22 MED ORDER — PANTOPRAZOLE SODIUM 40 MG PO TBEC
DELAYED_RELEASE_TABLET | ORAL | Status: AC
  Filled 2022-12-22: qty 1

## 2022-12-22 MED ORDER — DIPHENHYDRAMINE HCL 12.5 MG/5ML PO ELIX: 12.5000 mg | ORAL_SOLUTION | ORAL | Status: DC | PRN

## 2022-12-22 MED ORDER — CELECOXIB 200 MG PO CAPS
ORAL_CAPSULE | ORAL | Status: AC
  Filled 2022-12-22: qty 1

## 2022-12-22 MED ORDER — METFORMIN HCL ER 500 MG PO TB24
1500.0000 mg | ORAL_TABLET | Freq: Every day | ORAL | Status: DC
  Administered 2022-12-22: 1500 mg via ORAL

## 2022-12-22 MED ORDER — CEFAZOLIN SODIUM-DEXTROSE 2-4 GM/100ML-% IV SOLN
INTRAVENOUS | Status: AC
  Filled 2022-12-22: qty 100

## 2022-12-22 MED ORDER — MENTHOL 3 MG MT LOZG: 1.0000 | LOZENGE | OROMUCOSAL | Status: DC | PRN

## 2022-12-22 MED ORDER — SODIUM CHLORIDE (PF) 0.9 % IJ SOLN
INTRAMUSCULAR | Status: DC | PRN
  Administered 2022-12-22: 120 mL

## 2022-12-22 MED ORDER — ZOLPIDEM TARTRATE 5 MG PO TABS
ORAL_TABLET | ORAL | Status: AC
  Filled 2022-12-22: qty 1

## 2022-12-22 MED ORDER — BUPIVACAINE HCL (PF) 0.5 % IJ SOLN
INTRAMUSCULAR | Status: DC | PRN
  Administered 2022-12-22: 3 mL

## 2022-12-22 MED ORDER — FENTANYL CITRATE (PF) 100 MCG/2ML IJ SOLN
25.0000 ug | INTRAMUSCULAR | Status: DC | PRN
  Administered 2022-12-22: 25 ug via INTRAVENOUS

## 2022-12-22 MED ORDER — VENLAFAXINE HCL ER 37.5 MG PO CP24: 37.5000 mg | ORAL_CAPSULE | Freq: Every day | ORAL | Status: DC

## 2022-12-22 MED ORDER — FENTANYL CITRATE (PF) 100 MCG/2ML IJ SOLN
INTRAMUSCULAR | Status: AC
  Filled 2022-12-22: qty 2

## 2022-12-22 MED ORDER — SODIUM CHLORIDE 0.9 % IV SOLN: INTRAVENOUS | Status: DC | PRN

## 2022-12-22 MED ORDER — ORAL CARE MOUTH RINSE: 15.0000 mL | Freq: Once | OROMUCOSAL | Status: AC

## 2022-12-22 MED ORDER — ONDANSETRON HCL 4 MG/2ML IJ SOLN
INTRAMUSCULAR | Status: DC | PRN
  Administered 2022-12-22: 4 mg via INTRAVENOUS

## 2022-12-22 MED ORDER — CELECOXIB 200 MG PO CAPS
400.0000 mg | ORAL_CAPSULE | Freq: Once | ORAL | Status: AC
  Administered 2022-12-22: 400 mg via ORAL

## 2022-12-22 MED ORDER — OXYCODONE HCL 5 MG PO TABS
10.0000 mg | ORAL_TABLET | ORAL | Status: DC | PRN
  Administered 2022-12-22 (×2): 10 mg via ORAL

## 2022-12-22 MED ORDER — CHLORHEXIDINE GLUCONATE 0.12 % MT SOLN
15.0000 mL | Freq: Once | OROMUCOSAL | Status: AC
  Administered 2022-12-22: 15 mL via OROMUCOSAL

## 2022-12-22 MED ORDER — INSULIN ASPART 100 UNIT/ML IJ SOLN
0.0000 [IU] | Freq: Three times a day (TID) | INTRAMUSCULAR | Status: DC
  Administered 2022-12-22: 3 [IU] via SUBCUTANEOUS
  Administered 2022-12-23: 2 [IU] via SUBCUTANEOUS

## 2022-12-22 MED ORDER — INSULIN ASPART 100 UNIT/ML IJ SOLN: 0.0000 [IU] | Freq: Every day | INTRAMUSCULAR | Status: DC

## 2022-12-22 MED ORDER — VENLAFAXINE HCL ER 150 MG PO CP24
150.0000 mg | ORAL_CAPSULE | Freq: Every day | ORAL | Status: DC
  Administered 2022-12-23: 150 mg via ORAL
  Filled 2022-12-22: qty 1

## 2022-12-22 MED ORDER — GABAPENTIN 300 MG PO CAPS
600.0000 mg | ORAL_CAPSULE | Freq: Two times a day (BID) | ORAL | Status: DC
  Administered 2022-12-22 – 2022-12-23 (×2): 600 mg via ORAL

## 2022-12-22 MED ORDER — SENNOSIDES-DOCUSATE SODIUM 8.6-50 MG PO TABS
1.0000 | ORAL_TABLET | Freq: Two times a day (BID) | ORAL | Status: DC
  Administered 2022-12-22 – 2022-12-23 (×2): 1 via ORAL

## 2022-12-22 MED ORDER — TRANEXAMIC ACID-NACL 1000-0.7 MG/100ML-% IV SOLN
1000.0000 mg | INTRAVENOUS | Status: DC
  Administered 2022-12-22: 1000 mg via INTRAVENOUS

## 2022-12-22 MED ORDER — DILTIAZEM HCL ER COATED BEADS 180 MG PO CP24
180.0000 mg | ORAL_CAPSULE | Freq: Every day | ORAL | Status: DC
  Administered 2022-12-23: 180 mg via ORAL

## 2022-12-22 MED ORDER — TRANEXAMIC ACID-NACL 1000-0.7 MG/100ML-% IV SOLN
INTRAVENOUS | Status: DC | PRN
  Administered 2022-12-22: 1000 mg via INTRAVENOUS

## 2022-12-22 MED ORDER — ACETAMINOPHEN 10 MG/ML IV SOLN
INTRAVENOUS | Status: AC
Start: 2022-12-22 — End: ?
  Filled 2022-12-22: qty 100

## 2022-12-22 MED ORDER — DEXAMETHASONE SODIUM PHOSPHATE 10 MG/ML IJ SOLN
INTRAMUSCULAR | Status: AC
  Filled 2022-12-22: qty 1

## 2022-12-22 MED ORDER — VENLAFAXINE HCL ER 150 MG PO CP24: 150.0000 mg | ORAL_CAPSULE | Freq: Every day | ORAL | Status: DC

## 2022-12-22 MED ORDER — SURGIPHOR WOUND IRRIGATION SYSTEM - OPTIME
TOPICAL | Status: DC | PRN
  Administered 2022-12-22: 450 mL via TOPICAL

## 2022-12-22 MED ORDER — ALBUMIN HUMAN 5 % IV SOLN: INTRAVENOUS | Status: DC | PRN

## 2022-12-22 MED ORDER — PHENOL 1.4 % MT LIQD: 1.0000 | OROMUCOSAL | Status: DC | PRN

## 2022-12-22 MED ORDER — FENTANYL CITRATE (PF) 100 MCG/2ML IJ SOLN
INTRAMUSCULAR | Status: DC | PRN
  Administered 2022-12-22: 50 ug via INTRAVENOUS

## 2022-12-22 MED ORDER — SODIUM CHLORIDE 0.9% FLUSH: 10.0000 mL | Freq: Two times a day (BID) | INTRAVENOUS | Status: DC

## 2022-12-22 MED ORDER — CELECOXIB 200 MG PO CAPS
200.0000 mg | ORAL_CAPSULE | Freq: Two times a day (BID) | ORAL | Status: DC
  Administered 2022-12-22 – 2022-12-23 (×2): 200 mg via ORAL

## 2022-12-22 MED ORDER — MAGNESIUM OXIDE 400 MG PO TABS
400.0000 mg | ORAL_TABLET | Freq: Four times a day (QID) | ORAL | Status: DC
  Administered 2022-12-22 – 2022-12-23 (×3): 400 mg via ORAL
  Filled 2022-12-22 (×6): qty 1

## 2022-12-22 MED ORDER — HYDROMORPHONE HCL 1 MG/ML IJ SOLN: 0.5000 mg | INTRAMUSCULAR | Status: DC | PRN

## 2022-12-22 MED ORDER — ACETAMINOPHEN 325 MG PO TABS: 325.0000 mg | ORAL_TABLET | Freq: Four times a day (QID) | ORAL | Status: DC | PRN

## 2022-12-22 MED ORDER — VENLAFAXINE HCL ER 37.5 MG PO CP24
37.5000 mg | ORAL_CAPSULE | Freq: Every day | ORAL | Status: DC
  Administered 2022-12-23: 37.5 mg via ORAL
  Filled 2022-12-22: qty 1

## 2022-12-22 MED ORDER — OXYCODONE HCL 5 MG PO TABS
ORAL_TABLET | ORAL | Status: AC
  Filled 2022-12-22: qty 2

## 2022-12-22 MED ORDER — ONDANSETRON HCL 4 MG/2ML IJ SOLN: 4.0000 mg | Freq: Once | INTRAMUSCULAR | Status: DC | PRN

## 2022-12-22 MED ORDER — ROSUVASTATIN CALCIUM 20 MG PO TABS
10.0000 mg | ORAL_TABLET | Freq: Every day | ORAL | Status: DC
  Administered 2022-12-22 – 2022-12-23 (×2): 10 mg via ORAL

## 2022-12-22 MED ORDER — ROSUVASTATIN CALCIUM 20 MG PO TABS
ORAL_TABLET | ORAL | Status: AC
  Filled 2022-12-22: qty 1

## 2022-12-22 MED ORDER — BUPIVACAINE HCL (PF) 0.25 % IJ SOLN
INTRAMUSCULAR | Status: AC
  Filled 2022-12-22: qty 60

## 2022-12-22 MED ORDER — OXYCODONE HCL 5 MG PO TABS: 5.0000 mg | ORAL_TABLET | Freq: Once | ORAL | Status: DC | PRN

## 2022-12-22 MED ORDER — TRANEXAMIC ACID-NACL 1000-0.7 MG/100ML-% IV SOLN
INTRAVENOUS | Status: AC
  Filled 2022-12-22: qty 100

## 2022-12-22 MED ORDER — ONDANSETRON HCL 4 MG/2ML IJ SOLN: 4.0000 mg | Freq: Four times a day (QID) | INTRAMUSCULAR | Status: DC | PRN

## 2022-12-22 MED ORDER — LOSARTAN POTASSIUM 50 MG PO TABS
ORAL_TABLET | ORAL | Status: AC
  Filled 2022-12-22: qty 2

## 2022-12-22 MED ORDER — ALUM & MAG HYDROXIDE-SIMETH 200-200-20 MG/5ML PO SUSP: 30.0000 mL | ORAL | Status: DC | PRN

## 2022-12-22 MED ORDER — RIVAROXABAN 20 MG PO TABS
20.0000 mg | ORAL_TABLET | Freq: Every day | ORAL | Status: DC
  Filled 2022-12-22: qty 1

## 2022-12-22 MED ORDER — PROPOFOL 500 MG/50ML IV EMUL
INTRAVENOUS | Status: DC | PRN
  Administered 2022-12-22: 75 ug/kg/min via INTRAVENOUS

## 2022-12-22 MED ORDER — LORAZEPAM 1 MG PO TABS
1.0000 mg | ORAL_TABLET | Freq: Every day | ORAL | Status: DC
  Administered 2022-12-22 – 2022-12-23 (×2): 1 mg via ORAL
  Filled 2022-12-22 (×2): qty 1

## 2022-12-22 MED ORDER — ZOLPIDEM TARTRATE 5 MG PO TABS
5.0000 mg | ORAL_TABLET | Freq: Every evening | ORAL | Status: DC | PRN
  Administered 2022-12-22: 5 mg via ORAL

## 2022-12-22 MED ORDER — STERILE WATER FOR IRRIGATION IR SOLN
Status: DC | PRN
Start: 2022-12-22 — End: 2022-12-22
  Administered 2022-12-22: 1000 mL

## 2022-12-22 MED ORDER — BUPIVACAINE LIPOSOME 1.3 % IJ SUSP
INTRAMUSCULAR | Status: AC
  Filled 2022-12-22: qty 20

## 2022-12-22 MED ORDER — MAGNESIUM HYDROXIDE 400 MG/5ML PO SUSP
30.0000 mL | Freq: Every day | ORAL | Status: DC
Start: 2022-12-22 — End: 2022-12-23
  Administered 2022-12-23: 30 mL via ORAL

## 2022-12-22 MED ORDER — TRAMADOL HCL 50 MG PO TABS
50.0000 mg | ORAL_TABLET | ORAL | Status: DC | PRN
  Administered 2022-12-22: 100 mg via ORAL

## 2022-12-22 MED ORDER — BISACODYL 10 MG RE SUPP: 10.0000 mg | Freq: Every day | RECTAL | Status: DC | PRN

## 2022-12-22 MED ORDER — ACETAMINOPHEN 10 MG/ML IV SOLN
1000.0000 mg | Freq: Four times a day (QID) | INTRAVENOUS | Status: DC
  Administered 2022-12-22 – 2022-12-23 (×3): 1000 mg via INTRAVENOUS

## 2022-12-22 MED ORDER — GABAPENTIN 300 MG PO CAPS: 300.0000 mg | ORAL_CAPSULE | Freq: Once | ORAL | Status: DC

## 2022-12-22 MED ORDER — LOSARTAN POTASSIUM 50 MG PO TABS
100.0000 mg | ORAL_TABLET | Freq: Every day | ORAL | Status: DC
  Administered 2022-12-22 – 2022-12-23 (×2): 100 mg via ORAL
  Filled 2022-12-22: qty 2

## 2022-12-22 MED ORDER — PHENYLEPHRINE HCL-NACL 20-0.9 MG/250ML-% IV SOLN
INTRAVENOUS | Status: AC
  Filled 2022-12-22: qty 250

## 2022-12-22 MED ORDER — PANTOPRAZOLE SODIUM 40 MG PO TBEC
40.0000 mg | DELAYED_RELEASE_TABLET | Freq: Two times a day (BID) | ORAL | Status: DC
Start: 2022-12-22 — End: 2022-12-23
  Administered 2022-12-22 – 2022-12-23 (×2): 40 mg via ORAL

## 2022-12-22 MED ORDER — CEFAZOLIN SODIUM-DEXTROSE 2-4 GM/100ML-% IV SOLN
2.0000 g | INTRAVENOUS | Status: AC
  Administered 2022-12-22: 2 g via INTRAVENOUS

## 2022-12-22 MED ORDER — SODIUM CHLORIDE 0.9 % IV SOLN
INTRAVENOUS | Status: DC
Start: 2022-12-22 — End: 2022-12-23

## 2022-12-22 MED ORDER — TRANEXAMIC ACID-NACL 1000-0.7 MG/100ML-% IV SOLN
1000.0000 mg | Freq: Once | INTRAVENOUS | Status: AC
  Administered 2022-12-22: 1000 mg via INTRAVENOUS

## 2022-12-22 MED ORDER — CHLORHEXIDINE GLUCONATE 4 % EX SOLN
60.0000 mL | Freq: Once | CUTANEOUS | Status: AC
  Administered 2022-12-22: 4 via TOPICAL

## 2022-12-22 MED ORDER — CEFAZOLIN SODIUM-DEXTROSE 2-4 GM/100ML-% IV SOLN
2.0000 g | Freq: Four times a day (QID) | INTRAVENOUS | Status: AC
  Administered 2022-12-22 (×2): 2 g via INTRAVENOUS

## 2022-12-22 MED ORDER — FERROUS SULFATE 325 (65 FE) MG PO TABS
ORAL_TABLET | ORAL | Status: AC
  Filled 2022-12-22: qty 1

## 2022-12-22 MED ORDER — CHLORHEXIDINE GLUCONATE 0.12 % MT SOLN
OROMUCOSAL | Status: AC
Start: 2022-12-22 — End: ?
  Filled 2022-12-22: qty 15

## 2022-12-22 MED ORDER — LORAZEPAM 0.5 MG PO TABS
1.0000 mg | ORAL_TABLET | Freq: Every day | ORAL | Status: DC
  Filled 2022-12-22: qty 2

## 2022-12-22 MED ORDER — ALBUMIN HUMAN 5 % IV SOLN
INTRAVENOUS | Status: AC
  Filled 2022-12-22: qty 250

## 2022-12-22 MED ORDER — LACTATED RINGERS IV SOLN: INTRAVENOUS | Status: AC

## 2022-12-22 MED ORDER — SENNOSIDES-DOCUSATE SODIUM 8.6-50 MG PO TABS
ORAL_TABLET | ORAL | Status: AC
  Filled 2022-12-22: qty 1

## 2022-12-22 MED ORDER — METFORMIN HCL ER 500 MG PO TB24
ORAL_TABLET | ORAL | Status: AC
  Filled 2022-12-22: qty 3

## 2022-12-22 MED ORDER — ONDANSETRON HCL 4 MG PO TABS
4.0000 mg | ORAL_TABLET | Freq: Four times a day (QID) | ORAL | Status: DC | PRN
Start: 2022-12-22 — End: 2022-12-23

## 2022-12-22 MED ORDER — DEXAMETHASONE SODIUM PHOSPHATE 10 MG/ML IJ SOLN: 8.0000 mg | Freq: Once | INTRAMUSCULAR | Status: DC

## 2022-12-22 MED ORDER — PHENYLEPHRINE HCL-NACL 20-0.9 MG/250ML-% IV SOLN
INTRAVENOUS | Status: DC | PRN
  Administered 2022-12-22: 20 ug/min via INTRAVENOUS

## 2022-12-22 MED ORDER — ACETAMINOPHEN 10 MG/ML IV SOLN
1000.0000 mg | Freq: Once | INTRAVENOUS | Status: DC | PRN
Start: 2022-12-22 — End: 2022-12-22

## 2022-12-22 SURGICAL SUPPLY — 70 items
ATTUNE PS FEM LT SZ 4 CEM KNEE (Femur) IMPLANT
ATTUNE PSRP INSR SZ4 5 KNEE (Insert) IMPLANT
BASEPLATE TIBIAL ROTATING SZ 4 (Knees) IMPLANT
BATTERY INSTRU NAVIGATION (MISCELLANEOUS) ×4 IMPLANT
BIT DRILL QUICK REL 1/8 2PK SL (BIT) ×1 IMPLANT
BLADE SAW 70X12.5 (BLADE) ×1 IMPLANT
BLADE SAW 90X13X1.19 OSCILLAT (BLADE) ×1 IMPLANT
BLADE SAW 90X25X1.19 OSCILLAT (BLADE) ×1 IMPLANT
BONE CEMENT GENTAMICIN (Cement) ×2 IMPLANT
BRUSH SCRUB EZ PLAIN DRY (MISCELLANEOUS) ×1 IMPLANT
CEMENT BONE GENTAMICIN 40 (Cement) IMPLANT
COOLER POLAR GLACIER W/PUMP (MISCELLANEOUS) ×1 IMPLANT
CUFF TOURN SGL QUICK 24 (TOURNIQUET CUFF) ×1
CUFF TOURN SGL QUICK 30 (TOURNIQUET CUFF)
CUFF TRNQT CYL 24X4X16.5-23 (TOURNIQUET CUFF) IMPLANT
CUFF TRNQT CYL 30X4X21-28X (TOURNIQUET CUFF) IMPLANT
DRAPE SHEET LG 3/4 BI-LAMINATE (DRAPES) ×1 IMPLANT
DRSG AQUACEL AG ADV 3.5X14 (GAUZE/BANDAGES/DRESSINGS) ×1 IMPLANT
DRSG MEPILEX SACRM 8.7X9.8 (GAUZE/BANDAGES/DRESSINGS) ×1 IMPLANT
DRSG TEGADERM 4X4.75 (GAUZE/BANDAGES/DRESSINGS) ×1 IMPLANT
DURAPREP 26ML APPLICATOR (WOUND CARE) ×2 IMPLANT
ELECT CAUTERY BLADE 6.4 (BLADE) ×1 IMPLANT
ELECT REM PT RETURN 9FT ADLT (ELECTROSURGICAL) ×1
ELECTRODE REM PT RTRN 9FT ADLT (ELECTROSURGICAL) ×1 IMPLANT
EVACUATOR 1/8 PVC DRAIN (DRAIN) ×1 IMPLANT
EX-PIN ORTHOLOCK NAV 4X150 (PIN) ×2 IMPLANT
GAUZE XEROFORM 1X8 LF (GAUZE/BANDAGES/DRESSINGS) ×1 IMPLANT
GLOVE BIOGEL M STRL SZ7.5 (GLOVE) ×6 IMPLANT
GLOVE SURG UNDER POLY LF SZ8 (GLOVE) ×2 IMPLANT
GOWN STRL REUS W/ TWL LRG LVL3 (GOWN DISPOSABLE) ×1 IMPLANT
GOWN STRL REUS W/ TWL XL LVL3 (GOWN DISPOSABLE) ×1 IMPLANT
GOWN STRL REUS W/TWL LRG LVL3 (GOWN DISPOSABLE) ×1
GOWN STRL REUS W/TWL XL LVL3 (GOWN DISPOSABLE) ×1
GOWN TOGA ZIPPER T7+ PEEL AWAY (MISCELLANEOUS) ×1 IMPLANT
HANDPIECE INTERPULSE COAX TIP (DISPOSABLE) IMPLANT
HOLDER FOLEY CATH W/STRAP (MISCELLANEOUS) ×1 IMPLANT
HOOD PEEL AWAY T7 (MISCELLANEOUS) ×1 IMPLANT
IV NS IRRIG 3000ML ARTHROMATIC (IV SOLUTION) ×1 IMPLANT
KIT TURNOVER KIT A (KITS) ×1 IMPLANT
KNIFE SCULPS 14X20 (INSTRUMENTS) ×1 IMPLANT
MANIFOLD NEPTUNE II (INSTRUMENTS) ×2 IMPLANT
NDL SPNL 20GX3.5 QUINCKE YW (NEEDLE) ×2 IMPLANT
NEEDLE SPNL 20GX3.5 QUINCKE YW (NEEDLE) ×2 IMPLANT
PACK TOTAL KNEE (MISCELLANEOUS) ×1 IMPLANT
PAD ABD DERMACEA PRESS 5X9 (GAUZE/BANDAGES/DRESSINGS) ×2 IMPLANT
PAD ARMBOARD 7.5X6 YLW CONV (MISCELLANEOUS) ×3 IMPLANT
PAD WRAPON POLAR KNEE (MISCELLANEOUS) ×1 IMPLANT
PATELLA MEDIAL ATTUN 35MM KNEE (Knees) IMPLANT
PENCIL SMOKE EVACUATOR COATED (MISCELLANEOUS) ×1 IMPLANT
PIN DRILL FIX HALF THREAD (BIT) ×2 IMPLANT
PIN FIXATION 1/8DIA X 3INL (PIN) ×1 IMPLANT
PULSAVAC PLUS IRRIG FAN TIP (DISPOSABLE)
SOLUTION IRRIG SURGIPHOR (IV SOLUTION) ×1 IMPLANT
SPONGE DRAIN TRACH 4X4 STRL 2S (GAUZE/BANDAGES/DRESSINGS) ×1 IMPLANT
STAPLER SKIN PROX 35W (STAPLE) ×1 IMPLANT
STOCKINETTE BIAS CUT 6 980064 (GAUZE/BANDAGES/DRESSINGS) ×1 IMPLANT
STRAP TIBIA SHORT (MISCELLANEOUS) ×1 IMPLANT
SUCTION TUBE FRAZIER 10FR DISP (SUCTIONS) ×1 IMPLANT
SUT VIC AB 0 CT1 36 (SUTURE) ×1 IMPLANT
SUT VIC AB 1 CT1 36 (SUTURE) ×2 IMPLANT
SUT VIC AB 2-0 CT2 27 (SUTURE) ×1 IMPLANT
SYR 30ML LL (SYRINGE) ×2 IMPLANT
TIP FAN IRRIG PULSAVAC PLUS (DISPOSABLE) ×1 IMPLANT
TOWEL OR 17X26 4PK STRL BLUE (TOWEL DISPOSABLE) ×1 IMPLANT
TOWER CARTRIDGE SMART MIX (DISPOSABLE) ×1 IMPLANT
TRAP FLUID SMOKE EVACUATOR (MISCELLANEOUS) ×1 IMPLANT
TRAY FOLEY MTR SLVR 16FR STAT (SET/KITS/TRAYS/PACK) ×1 IMPLANT
TUBING CONNECTING 10 (TUBING) ×2 IMPLANT
WATER STERILE IRR 1000ML POUR (IV SOLUTION) ×1 IMPLANT
WRAPON POLAR PAD KNEE (MISCELLANEOUS) ×1

## 2022-12-22 NOTE — Anesthesia Preprocedure Evaluation (Addendum)
Anesthesia Evaluation  Patient identified by MRN, date of birth, ID band Patient awake    Reviewed: Allergy & Precautions, NPO status , Patient's Chart, lab work & pertinent test results  History of Anesthesia Complications Negative for: history of anesthetic complications  Airway Mallampati: III   Neck ROM: Full    Dental  (+) Implants   Pulmonary COPD, former smoker (quit 2009)   Pulmonary exam normal breath sounds clear to auscultation       Cardiovascular hypertension, Normal cardiovascular exam+ dysrhythmias (a fib on Xarelto, last dose 12/18/22)  Rhythm:Regular Rate:Normal  Hx PE 2022 s/p IVC 12/16/22  ECG 10/14/22:  Sinus bradycardia When compared with ECG of 29-Jun-2020 09:13, Sinus rhythm has replaced Atrial fibrillation Vent. rate has decreased BY 81 BPM ST no longer depressed in Anterior leads  Echo 09/26/20:    1. The left ventricle is normal in size with normal wall thickness.    2. The left ventricular systolic function is normal, LVEF is visually estimated at 60-65%.    3. The right ventricle is normal in size, with normal systolic function; no evidence of right heart strain.    Neuro/Psych negative neurological ROS     GI/Hepatic negative GI ROS,,,  Endo/Other  diabetes, Type 2  Obesity   Renal/GU      Musculoskeletal  (+) Arthritis ,    Abdominal   Peds  Hematology negative hematology ROS (+)   Anesthesia Other Findings Last dose of Ozempic 12/14/22.   Cardiology note 10/14/22:  Paroxysmal atrial fibrillation Prior episode of atrial fibrillation in the setting of COVID Discharged from the hospital on Eliquis, Was changed to warfarin Reports that she took herself off warfarin as she does not have a PE on CT scan Patient assistance forms filled out for Xarelto 20 mg daily   Severe emphysema/obstructive sleep apnea overlap Followed by pulmonary Prior history COVID-19, hospitalized May  2022 High probability PE on VQ scan, was treated with warfarin CT scan June 2023, no PE  Reports breathing is stable   Shortness of breath, angina, chest tightness Long smoking history coronary calcification on chest CT scan Cardiac CTA with nonobstructive coronary disease August 2023   Diabetes type 2 We have encouraged continued exercise, careful diet management in an effort to lose weight.  A1c 6.1   Aortic atherosclerosis Seen on CT scan, cholesterol at goal Non-smoker, cholesterol controlled, diabetes numbers improving  Reproductive/Obstetrics Ovarian CA                             Anesthesia Physical Anesthesia Plan  ASA: 3  Anesthesia Plan: Spinal   Post-op Pain Management:    Induction: Intravenous  PONV Risk Score and Plan: 3 and Propofol infusion, TIVA, Treatment may vary due to age or medical condition and Ondansetron  Airway Management Planned: Natural Airway and Nasal Cannula  Additional Equipment:   Intra-op Plan:   Post-operative Plan:   Informed Consent: I have reviewed the patients History and Physical, chart, labs and discussed the procedure including the risks, benefits and alternatives for the proposed anesthesia with the patient or authorized representative who has indicated his/her understanding and acceptance.       Plan Discussed with: CRNA  Anesthesia Plan Comments: (Plan for spinal and GA with natural airway, LMA/GETA backup.  Patient consented for risks of anesthesia including but not limited to:  - adverse reactions to medications - damage to eyes, teeth, lips or other oral mucosa -  nerve damage due to positioning  - sore throat or hoarseness - headache, bleeding, infection, nerve damage 2/2 spinal - damage to heart, brain, nerves, lungs, other parts of body or loss of life  Informed patient about role of CRNA in peri- and intra-operative care.  Patient voiced understanding.)        Anesthesia Quick  Evaluation

## 2022-12-22 NOTE — Progress Notes (Signed)
Patient awake/alert x4. Moving bil lower ext without event, Pulses intact +2, dressing c/d/I with polar care. Medicated for discomfort as ordered.

## 2022-12-22 NOTE — Progress Notes (Signed)
Subjective: Day of Surgery Procedure(s) (LRB): COMPUTER ASSISTED TOTAL KNEE ARTHROPLASTY (Left) Patient reports pain as mild.   Patient seen in rounds with Dr. Ernest Pine. Patient is well, and has had no acute complaints or problems.  Denies any CP, SOB, N/B, fevers or chills. We will start therapy today.  Plan is to go Home after hospital stay.  Objective: Vital signs in last 24 hours: Temp:  [97.2 F (36.2 C)-98 F (36.7 C)] 97.3 F (36.3 C) (11/18 1302) Pulse Rate:  [46-52] 50 (11/18 1302) Resp:  [14-17] 16 (11/18 1302) BP: (115-140)/(37-55) 133/52 (11/18 1302) SpO2:  [95 %-99 %] 98 % (11/18 1302) Weight:  [83.9 kg] 83.9 kg (11/18 1217)  Intake/Output from previous day:  Intake/Output Summary (Last 24 hours) at 12/22/2022 1534 Last data filed at 12/22/2022 1458 Gross per 24 hour  Intake 1725 ml  Output 405 ml  Net 1320 ml    Intake/Output this shift: Total I/O In: 1725 [P.O.:225; I.V.:950; IV Piggyback:550] Out: 405 [Urine:320; Drains:35; Blood:50]  Labs: No results for input(s): "HGB" in the last 72 hours. No results for input(s): "WBC", "RBC", "HCT", "PLT" in the last 72 hours. No results for input(s): "NA", "K", "CL", "CO2", "BUN", "CREATININE", "GLUCOSE", "CALCIUM" in the last 72 hours. No results for input(s): "LABPT", "INR" in the last 72 hours.  EXAM General - Patient is Alert, Appropriate, and Oriented Extremity - Neurologically intact ABD soft Neurovascular intact Sensation intact distally Intact pulses distally Dorsiflexion/Plantar flexion intact No cellulitis present Compartment soft Dressing - dressing C/D/I and no drainage Motor Function - intact, moving foot and toes well on exam.  Able to plantar and dorsiflex with good strength and range of motion.  She is neurovascular intact all dermatomes down her left lower extremity. JP Drain pulled without difficulty. Intact  Past Medical History:  Diagnosis Date   Anxiety    a.) on BZO PRN (lorazepam)    Aortic atherosclerosis (HCC)    CAD (coronary artery disease)    a.) MV 05/23/2014: EF 75%, no ischemia/artifact; b.) cCTA 09/16/2021: Ca2+ 340 (79th %ile) in pLAD and pLCx distributions   Chronic hypoxemic respiratory failure (HCC)    Chronic urticaria    COPD (chronic obstructive pulmonary disease) (HCC)    DDD (degenerative disc disease), lumbar    Depression    Diastolic dysfunction    a.) TTE 05/23/2014: EF >55%, no RWMAs, G1DD, triv AR, mild MR, RVSP 37.1; b.) TTE 01/19/2018: EF >55%, mild LVH, no RWMAs, G1DD, mild LAE, triv AR/MR/PR, mild TR, RVSP 36.1; c.) TTE 06/29/2020: EF 60-65%, no RWMAs, G1DD, RVSP 14.9   GERD (gastroesophageal reflux disease)    Hepatic steatosis    History of 2019 novel coronavirus disease (COVID-19) 06/25/2020   HLD (hyperlipidemia)    Hypertension    Insomnia    a.) on hypnotic (zolpidem)   Menopausal state    Morbid obesity (HCC)    Non-cardiac chest pain    On rivaroxaban therapy    Osteoarthritis    Ovarian cancer (HCC) 2012   a.) s/p Tx with systemic chemotherapy   Oxygen desaturation during sleep    a.) on 1-2 L/[place] supplemental oxygen while sleeping   PAF (paroxysmal atrial fibrillation) (HCC) 06/2020   a.) Dx'd in setting of SARS-CoV-2 infection; b.) CHA2DS2-VASc = 8 (age x2, sex, HTN, VTE x2, vascular disease history, T2DM) as of 12/19/2022; c.) cardiac rate/rhythm maintained on oral diltiazem; chronically anticoagulated using rivaroxaban   Pneumonia 1987   Rectovaginal fistula    Sleep apnea  a.) does not utilize nocturnal PAP therapy   T2DM (type 2 diabetes mellitus) (HCC)    VTE (venous thromboembolism) 06/2020   a.) developed in setting of SARS-CoV-2 infection and A.fib with RVR    Assessment/Plan: Day of Surgery Procedure(s) (LRB): COMPUTER ASSISTED TOTAL KNEE ARTHROPLASTY (Left) Principal Problem:   History of total knee arthroplasty, left  Estimated body mass index is 31.75 kg/m as calculated from the following:   Height  as of this encounter: 5\' 4"  (1.626 m).   Weight as of this encounter: 83.9 kg. Advance diet Up with therapy  Patient will continue to work with physical therapy to pass postoperative PT protocols, ROM and strengthening  Discussed with the patient continuing to utilize Polar Care  Patient will use bone foam in 20-30 minute intervals  Patient will wear TED hose bilaterally to help prevent DVT and clot formation  Discussed the Aquacel bandage.  This bandage will stay in place 7 days postoperatively.  Can be replaced with honeycomb bandages that will be sent home with the patient  Discussed sending the patient home with tramadol and oxycodone for as needed pain management.  Patient will also be sent home with Celebrex to help with swelling and inflammation.  Patient will take at home Xarelto  daily for DVT prophylaxis  JP drain removed without difficulty, intact  Weight-Bearing as tolerated to left leg  Patient will follow-up with [facility] orthopedics in 2 weeks for staple removal and reevaluation  Rayburn Go, PA-C [facility] 12/22/2022, 3:34 PM

## 2022-12-22 NOTE — Op Note (Signed)
OPERATIVE NOTE  DATE OF SURGERY:  12/22/2022  PATIENT NAME:  ALYNNE BRADSHAW   DOB: 08-06-1945  MRN: [id number]  PRE-OPERATIVE DIAGNOSIS: Degenerative arthrosis of the left knee, primary  POST-OPERATIVE DIAGNOSIS:  Same  PROCEDURE:  Left total knee arthroplasty using computer-assisted navigation  SURGEON:  Jena Gauss. M.D.  ASSISTANT:  Gean Birchwood, PA-C (present and scrubbed throughout the case, critical for assistance with exposure, retraction, instrumentation, and closure)  ANESTHESIA: spinal  ESTIMATED BLOOD LOSS: 50 mL  FLUIDS REPLACED: 700 mL of crystalloid,  250 mL of colloid  TOURNIQUET TIME: 73 minutes  DRAINS: 2 medium Hemovac drains  SOFT TISSUE RELEASES: Anterior cruciate ligament, posterior cruciate ligament, deep medial collateral ligament, patellofemoral ligament  IMPLANTS UTILIZED: DePuy Attune size 4 posterior stabilized femoral component (cemented), size 4 rotating platform tibial component (cemented), 35 mm medialized dome patella (cemented), and a 5 mm stabilized rotating platform polyethylene insert.  INDICATIONS FOR SURGERY: KERSTAN BUELOW is a 77 y.o. year old female with a long history of progressive knee pain. X-rays demonstrated severe degenerative changes in tricompartmental fashion. The patient had not seen any significant improvement despite conservative nonsurgical intervention. After discussion of the risks and benefits of surgical intervention, the patient expressed understanding of the risks benefits and agree with plans for total knee arthroplasty.   The risks, benefits, and alternatives were discussed at length including but not limited to the risks of infection, bleeding, nerve injury, stiffness, blood clots, the need for revision surgery, cardiopulmonary complications, among others, and they were willing to proceed.  PROCEDURE IN DETAIL: The patient was brought into the operating room and, after adequate spinal anesthesia was  achieved, a tourniquet was placed on the patient's upper thigh. The patient's knee and leg were cleaned and prepped with alcohol and DuraPrep and draped in the usual sterile fashion. A "timeout" was performed as per usual protocol. The lower extremity was exsanguinated using an Esmarch, and the tourniquet was inflated to 300 mmHg. An anterior longitudinal incision was made followed by a standard mid vastus approach. The deep fibers of the medial collateral ligament were elevated in a subperiosteal fashion off of the medial flare of the tibia so as to maintain a continuous soft tissue sleeve. The patella was subluxed laterally and the patellofemoral ligament was incised. Inspection of the knee demonstrated severe degenerative changes with full-thickness loss of articular cartilage. Osteophytes were debrided using a rongeur. Anterior and posterior cruciate ligaments were excised. Two 4.0 mm Schanz pins were inserted in the femur and into the tibia for attachment of the array of trackers used for computer-assisted navigation. Hip center was identified using a circumduction technique. Distal landmarks were mapped using the computer. The distal femur and proximal tibia were mapped using the computer. The distal femoral cutting guide was positioned using computer-assisted navigation so as to achieve a 5 distal valgus cut. The femur was sized and it was felt that a size 4 femoral component was appropriate. A size 4 femoral cutting guide was positioned and the anterior cut was performed and verified using the computer. This was followed by completion of the posterior and chamfer cuts. Femoral cutting guide for the central box was then positioned in the center box cut was performed.  Attention was then directed to the proximal tibia. Medial and lateral menisci were excised. The extramedullary tibial cutting guide was positioned using computer-assisted navigation so as to achieve a 0 varus-valgus alignment and 3  posterior slope. The cut was performed and verified using  the computer. The proximal tibia was sized and it was felt that a size 4 tibial tray was appropriate. Tibial and femoral trials were inserted followed by insertion of a 5 mm polyethylene insert. This allowed for excellent mediolateral soft tissue balancing both in flexion and in full extension. Finally, the patella was cut and prepared so as to accommodate a 35 mm medialized dome patella. A patella trial was placed and the knee was placed through a range of motion with excellent patellar tracking appreciated. The femoral trial was removed after debridement of posterior osteophytes. The central post-hole for the tibial component was reamed followed by insertion of a keel punch. Tibial trials were then removed. Cut surfaces of bone were irrigated with copious amounts of normal saline using pulsatile lavage and then suctioned dry. Polymethylmethacrylate cement with gentamicin was prepared in the usual fashion using a vacuum mixer. Cement was applied to the cut surface of the proximal tibia as well as along the undersurface of a size 4 rotating platform tibial component. Tibial component was positioned and impacted into place. Excess cement was removed using Personal assistant. Cement was then applied to the cut surfaces of the femur as well as along the posterior flanges of the size 4 femoral component. The femoral component was positioned and impacted into place. Excess cement was removed using Personal assistant. A 5 mm polyethylene trial was inserted and the knee was brought into full extension with steady axial compression applied. Finally, cement was applied to the backside of a 35 mm medialized dome patella and the patellar component was positioned and patellar clamp applied. Excess cement was removed using Personal assistant. After adequate curing of the cement, the tourniquet was deflated after a total tourniquet time of 73 minutes. Hemostasis was achieved using  electrocautery. The knee was irrigated with copious amounts of normal saline using pulsatile lavage followed by 450 ml of Surgiphor and then suctioned dry. 20 mL of 1.3% Exparel and 60 mL of 0.25% Marcaine in 40 mL of normal saline was injected along the posterior capsule, medial and lateral gutters, and along the arthrotomy site. A 5 mm stabilized rotating platform polyethylene insert was inserted and the knee was placed through a range of motion with excellent mediolateral soft tissue balancing appreciated and excellent patellar tracking noted. 2 medium drains were placed in the wound bed and brought out through separate stab incisions. The medial parapatellar portion of the incision was reapproximated using interrupted sutures of #1 Vicryl. Subcutaneous tissue was approximated in layers using first #0 Vicryl followed #2-0 Vicryl. The skin was approximated with skin staples. A sterile dressing was applied.  The patient tolerated the procedure well and was transported to the recovery room in stable condition.    Vernona Peake P. Angie Fava., M.D.

## 2022-12-22 NOTE — Discharge Summary (Signed)
Physician Discharge Summary  Subjective: Day of Surgery Procedure(s) (LRB): COMPUTER ASSISTED TOTAL KNEE ARTHROPLASTY (Left) Patient reports pain as mild.   Patient seen in rounds with Dr. Ernest Pine. Patient is well, and has had no acute complaints or problems.  Denies any CP, SOB, N/B, fevers or chills. We will continue therapy today.  Patient is ready to go home  Physician Discharge Summary  Patient ID: Kayla Wu MRN: [id number] DOB/AGE: Jul 10, 1945 77 y.o.  Admit date: 12/22/2022 Discharge date: 12/22/2022  Admission Diagnoses:  Discharge Diagnoses:  Principal Problem:   History of total knee arthroplasty, left   Discharged Condition: good  Hospital Course: Patient presented to the hospital on 12/22/2018 for for an elective left total knee arthroplasty performed by Dr. Ernest Pine.  The patient was given 1 g of TXA and 2 g of Ancef perioperatively.  She tolerated the procedure well without any complications or issues.  See procedural note below for details.  Postoperatively, the patient did well.  She was able to pass her PT protocols postop day 1.  She was able to void her bladder.  Her JP drain was removed without difficulty, intact.  Her vital signs are stable and physical exam is unremarkable.  She is not having any CP, SOB, N/B, fevers or chills.  Patient is stable for discharge home.  PROCEDURE:  Left total knee arthroplasty using computer-assisted navigation   SURGEON:  Jena Gauss. M.D.   ASSISTANT:  Gean Birchwood, PA-C (present and scrubbed throughout the case, critical for assistance with exposure, retraction, instrumentation, and closure)   ANESTHESIA: spinal   ESTIMATED BLOOD LOSS: 50 mL   FLUIDS REPLACED: 700 mL of crystalloid,  250 mL of colloid   TOURNIQUET TIME: 73 minutes   DRAINS: 2 medium Hemovac drains   SOFT TISSUE RELEASES: Anterior cruciate ligament, posterior cruciate ligament, deep medial collateral ligament, patellofemoral ligament    IMPLANTS UTILIZED: DePuy Attune size 4 posterior stabilized femoral component (cemented), size 4 rotating platform tibial component (cemented), 35 mm medialized dome patella (cemented), and a 5 mm stabilized rotating platform polyethylene insert.  Treatments: none  Discharge Exam: Blood pressure (!) 133/52, pulse (!) 50, temperature (!) 97.3 F (36.3 C), temperature source Temporal, resp. rate 16, height 5\' 4"  (1.626 m), weight 83.9 kg, SpO2 98%.   Disposition: home There are no questions and answers to display.         Allergies as of 12/22/2022       Reactions   Iodinated Contrast Media Hives, Shortness Of Breath, Itching   Patient states she had itching, hives, and SOB after injection.    Iohexol Itching   Breakthrough contrast reaction.  Patient took premedication (13 hr prep) prior to contrasted scan for prior episode itching /nausea/ headache on 01/25/2019 with contrast. Patient experienced breakthrough severe itching and redness of the skin after contrast admin after having taken prep. See Contrast reaction note 04/29/2019 for further details.     Med Rec must be completed prior to using this [facility]***        Durable Medical Equipment  (From admission, onward)           Start     Ordered   12/22/22 1115  DME Walker rolling  Once       Question:  Patient needs a walker to treat with the following condition  Answer:  Total knee replacement status   12/22/22 1114   12/22/22 1115  DME Bedside commode  Once  Comments: Patient is not able to walk the distance required to go the bathroom, or he/she is unable to safely negotiate stairs required to access the bathroom.  A 3in1 BSC will alleviate this problem  Question:  Patient needs a bedside commode to treat with the following condition  Answer:  Total knee replacement status   12/22/22 1114            Follow-up Information     Rayburn Go, PA-C Follow up on 01/06/2023.   Specialty: Orthopedic  Surgery Why: at 1:45pm Contact information: [address] [place] [address] [phone]         Donato Heinz, MD Follow up on 02/05/2023.   Specialty: Orthopedic Surgery Why: at 9:45am Contact information: [address] [facility] [place] [address] [phone]                 Signed: Gean Birchwood 12/22/2022, 3:38 PM   Objective: Vital signs in last 24 hours: Temp:  [97.2 F (36.2 C)-98 F (36.7 C)] 97.3 F (36.3 C) (11/18 1302) Pulse Rate:  [46-52] 50 (11/18 1302) Resp:  [14-17] 16 (11/18 1302) BP: (115-140)/(37-55) 133/52 (11/18 1302) SpO2:  [95 %-99 %] 98 % (11/18 1302) Weight:  [83.9 kg] 83.9 kg (11/18 1217)  Intake/Output from previous day:  Intake/Output Summary (Last 24 hours) at 12/22/2022 1538 Last data filed at 12/22/2022 1458 Gross per 24 hour  Intake 1725 ml  Output 405 ml  Net 1320 ml    Intake/Output this shift: Total I/O In: 1725 [P.O.:225; I.V.:950; IV Piggyback:550] Out: 405 [Urine:320; Drains:35; Blood:50]  Labs: No results for input(s): "HGB" in the last 72 hours. No results for input(s): "WBC", "RBC", "HCT", "PLT" in the last 72 hours. No results for input(s): "NA", "K", "CL", "CO2", "BUN", "CREATININE", "GLUCOSE", "CALCIUM" in the last 72 hours. No results for input(s): "LABPT", "INR" in the last 72 hours.  EXAM: General - Patient is Alert, Appropriate, and Oriented Extremity - Neurologically intact ABD soft Neurovascular intact Sensation intact distally Intact pulses distally Dorsiflexion/Plantar flexion intact No cellulitis present Compartment soft Dressing - dressing C/D/I and no drainage Motor Function - intact, moving foot and toes well on exam.  Able to plantar and dorsiflex with good strength and range of motion.  She is neurovascular intact all dermatomes down her left lower extremity. JP Drain pulled without difficulty. Intact  Assessment/Plan: Day of Surgery Procedure(s)  (LRB): COMPUTER ASSISTED TOTAL KNEE ARTHROPLASTY (Left) Procedure(s) (LRB): COMPUTER ASSISTED TOTAL KNEE ARTHROPLASTY (Left) Past Medical History:  Diagnosis Date   Anxiety    a.) on BZO PRN (lorazepam)   Aortic atherosclerosis (HCC)    CAD (coronary artery disease)    a.) MV 05/23/2014: EF 75%, no ischemia/artifact; b.) cCTA 09/16/2021: Ca2+ 340 (79th %ile) in pLAD and pLCx distributions   Chronic hypoxemic respiratory failure (HCC)    Chronic urticaria    COPD (chronic obstructive pulmonary disease) (HCC)    DDD (degenerative disc disease), lumbar    Depression    Diastolic dysfunction    a.) TTE 05/23/2014: EF >55%, no RWMAs, G1DD, triv AR, mild MR, RVSP 37.1; b.) TTE 01/19/2018: EF >55%, mild LVH, no RWMAs, G1DD, mild LAE, triv AR/MR/PR, mild TR, RVSP 36.1; c.) TTE 06/29/2020: EF 60-65%, no RWMAs, G1DD, RVSP 14.9   GERD (gastroesophageal reflux disease)    Hepatic steatosis    History of 2019 novel coronavirus disease (COVID-19) 06/25/2020   HLD (hyperlipidemia)    Hypertension    Insomnia  a.) on hypnotic (zolpidem)   Menopausal state    Morbid obesity (HCC)    Non-cardiac chest pain    On rivaroxaban therapy    Osteoarthritis    Ovarian cancer (HCC) 2012   a.) s/p Tx with systemic chemotherapy   Oxygen desaturation during sleep    a.) on 1-2 L/[place] supplemental oxygen while sleeping   PAF (paroxysmal atrial fibrillation) (HCC) 06/2020   a.) Dx'd in setting of SARS-CoV-2 infection; b.) CHA2DS2-VASc = 8 (age x2, sex, HTN, VTE x2, vascular disease history, T2DM) as of 12/19/2022; c.) cardiac rate/rhythm maintained on oral diltiazem; chronically anticoagulated using rivaroxaban   Pneumonia 1987   Rectovaginal fistula    Sleep apnea    a.) does not utilize nocturnal PAP therapy   T2DM (type 2 diabetes mellitus) (HCC)    VTE (venous thromboembolism) 06/2020   a.) developed in setting of SARS-CoV-2 infection and A.fib with RVR   Principal Problem:   History of total knee  arthroplasty, left  Estimated body mass index is 31.75 kg/m as calculated from the following:   Height as of this encounter: 5\' 4"  (1.626 m).   Weight as of this encounter: 83.9 kg.  Patient will continue to work with HHPT on gait, ROM and strengthening   Discussed with the patient continuing to utilize [employer]   Patient will use bone foam in 20-30 minute intervals   Patient will wear TED hose bilaterally to help prevent DVT and clot formation   Discussed the Aquacel bandage.  This bandage will stay in place 7 days postoperatively.  Can be replaced with honeycomb bandages that will be sent home with the patient   Discussed sending the patient home with tramadol and oxycodone for as needed pain management.  Patient will also be sent home with Celebrex to help with swelling and inflammation.  Patient will take at home Xarelto for DVT prophylaxis   JP drain removed without difficulty, intact   Weight-Bearing as tolerated to left leg   Patient will follow-up with [facility] clinic orthopedics in 2 weeks for staple removal and reevaluation  Diet - Regular diet Follow up - in 2 weeks Activity - WBAT Disposition - Home Condition Upon Discharge - Good DVT Prophylaxis - Xarelto and TED hose  Danise Edge, PA-C Orthopaedic Surgery 12/22/2022, 3:38 PM

## 2022-12-22 NOTE — Transfer of Care (Signed)
Immediate Anesthesia Transfer of Care Note  Patient: Kayla Wu  Procedure(s) Performed: COMPUTER ASSISTED TOTAL KNEE ARTHROPLASTY (Left: Knee)  Patient Location: PACU  Anesthesia Type:MAC and Spinal  Level of Consciousness: awake, alert , and oriented  Airway & Oxygen Therapy: Patient Spontanous Breathing and Patient connected to face mask oxygen  Post-op Assessment: Report given to RN and Post -op Vital signs reviewed and stable  Post vital signs: Reviewed and stable  Last Vitals:  Vitals Value Taken Time  BP 115/37 12/22/22 1049  Temp 36.6 C 12/22/22 1049  Pulse 47 12/22/22 1050  Resp 16 12/22/22 1050  SpO2 98 % 12/22/22 1050  Vitals shown include unfiled device data.  Last Pain:  Vitals:   12/22/22 0635  TempSrc: Temporal  PainSc: 0-No pain         Complications: No notable events documented.

## 2022-12-22 NOTE — H&P (Signed)
ORTHOPAEDIC HISTORY & PHYSICAL Latanya Maudlin, PA - 12/15/2022 2:15 PM EST Formatting of this note is different from the original. Images from the original note were not included. Chief Complaint Chief Complaint Patient presents with Knee Pain H & P LEFT KNEE  Reason for Visit Kayla Wu is a 77 y.o. who presents today for a history and physical. The patient is to undergo a left total knee arthroplasty on 12/22/2022. Since her last visit at the clinic there has been no improvement in her condition. The patient expresses her desire to proceed with surgery.  She reported the onset of left knee pain in February. She does not recall any specific trauma or aggravating event. She localizes most of the pain along the medial aspect of the knee. She reports some swelling, no locking, and some giving way of the knee. The pain is aggravated by any weight bearing. The knee pain limits the patient's ability to ambulate long distances. The patient has not appreciated any significant improvement despite intraarticular corticosteroid injection, viscosupplementation, activity modification, arthroscopic subchondroplasty, or narcotic analgesia. She is not using any ambulatory aids. The patient states that the knee pain has progressed to the point that it is significantly interfering with her activities of daily living.  Past Medical History Past Medical History: Diagnosis Date Aortic atherosclerosis (CMS-HCC) on CT scan 01/2020 Atrial fibrillation with RVR (CMS/HHS-HCC) 06/29/2020 Chronic anemia, unspecified 01/04/2018 Chronic obstructive pulmonary disease with acute exacerbation (CMS/HHS-HCC) 07/08/2012 Overview: Followed in Pulmonary clinic/ [facility]/ [place] - 07/08/2012 Walked RA x 3 laps @ 185 ft each stopped due to End of study, no desats Last Assessment & Plan: Clinically mild to moderate and really not limiting her at this point. As I explained to this patient in detail: although there  may be significant copd present, it does not appear to be limiting activity toler COPD (chronic obstructive pulmonary disease) (CMS/HHS-HCC) 04/20/2013 COVID-19 06/2020 Degenerative disc disease Essential hypertension, benign GERD (gastroesophageal reflux disease) Hepatic steatosis Other and unspecified hyperlipidemia 04/20/2013 Ovarian cancer (diagnosis 2012) with recurrences in 2017 and 2021 Stage IC endometrioid of the ovary. Chemotherapy 2012 + 2017 + 2021. Panlobular emphysema (CMS-HCC) 04/20/2013 Paroxysmal atrial fibrillation (CMS/HHS-HCC) Pulmonary embolism (CMS/HHS-HCC) 09/25/2020 Pulmonary embolus (CMS/HHS-HCC) 06/2020 + V/Q [facility] Rectovaginal fistula 06/2015 Type 2 diabetes mellitus (CMS/HHS-HCC)  Past Surgical History Past Surgical History: Procedure Laterality Date COLONOSCOPY 08/29/2002 Int Hemorrhoids, Diverticulosis HYSTERECTOMY SUPRACERVICAL ABDOMINAL W/REMOVAL TUBES &/OR OVARIES 2012  bilateral salpingoophorectomy 2012 Xlap-BSO clon 03/28/2013 Adenomatous Polyps: CBF 03/2016; Recall Ltr mailed 01/31/2016 (dw) OMENTECTOMY 2017 Infragastric omentectomy, supralevator posterior exenteration, intraperitoneal rectosigmoid resection, mobilization splenic flexure, tumor debulking from anterior abdominal wall, bladder peritoneum, rectosigmoid. Ablation tumor from liver surface. COLONOSCOPY 01/27/2019 Negative colon biopsy @[facility] /Repeat 70yrs/CL EGD 01/27/2019 Fundic gland polyps/Barrett's/@ [facility]/Repeat 9yrs/CL EGD @ [facility] 06/28/2021 Barrett's Esophagus/PHx [facility]/Repeat 3-43yrs/CTL Left knee subchondroplasty of the medial femoral condyle (percutaneous treatment of medial femoral condyle insufficiency fracture) partial synovectomy of patellofemoral, medial, and lateral compartments, chondroplasty of medial compartment Left 06/27/2022 Dr. Allena Katz APPENDECTOMY bowel obstruction 2012 CHOLECYSTECTOMY  Past Family History Family History Problem Relation Age of  Onset Stroke Mother Cervical cancer Sister  Medications Current Outpatient Medications Medication Sig Dispense Refill insulin NPH-REGULAR (HUMULIN 70/30) 100 unit/mL (70-30) injection Inject 25 Units subcutaneously once daily as needed ((blood sugar of 130 or higher).) albuterol 90 mcg/actuation inhaler Inhale 2 inhalations into the lungs every 4 (four) hours as needed for Wheezing 1 each 5 calcitonin, salmon, (MIACALCIN) 200 unit/actuation nasal spray Place 1 spray into  the left nostril once daily for 30 days 3.7 mL 0 calcium citrate-vitamin D3 (CITRACAL + D MAXIMUM) 315 mg-6.25 mcg (250 unit) tablet Take 2 tablets by mouth 2 (two) times daily with meals 120 tablet 11 cyanocobalamin (VITAMIN B12) 1000 MCG tablet Take 1,000 mcg by mouth once daily dilTIAZem (CARDIZEM CD) 180 MG CD capsule Take 180 mg by mouth once daily gabapentin (NEURONTIN) 600 MG tablet Take 1 tablet (600 mg total) by mouth 3 (three) times daily as needed 90 tablet 3 HYDROcodone-acetaminophen (NORCO) 10-325 mg tablet Take 1 tablet by mouth 2 (two) times daily 60 tablet 0 hydrOXYzine (ATARAX) 50 MG tablet Take 50 mg by mouth 3 (three) times daily as needed for Itching LORazepam (ATIVAN) 1 MG tablet TAKE 1 TABLET TWICE DAILY AS NEEDED FOR ANXIETY OR SLEEP 60 tablet 2 losartan (COZAAR) 100 MG tablet take 1 tablet every day 90 tablet 3 MAGNESIUM ORAL Take 400 mg by mouth 3 (three) times daily magnesium oxide (MAG-OX) 400 mg (241.3 mg magnesium) tablet Take 400 mg by mouth 4 (four) times daily metFORMIN (GLUCOPHAGE-XR) 500 MG XR tablet TAKE 3 TABLETS EVERY DAY WITH DINNER 270 tablet 3 omeprazole (PRILOSEC) 40 MG DR capsule TAKE 1 CAPSULE (40 MG TOTAL) BY MOUTH 2 (TWO) TIMES DAILY BEFORE MEALS 180 capsule 3 polyethylene glycol (MIRALAX) powder Take 17 g by mouth once daily Mix in 4-8ounces of fluid prior to taking. 510 g 0 rivaroxaban (XARELTO) 20 mg tablet Take 20 mg by mouth daily with dinner rosuvastatin (CRESTOR) 10 MG  tablet TAKE 1 TABLET ONE TIME DAILY 90 tablet 3 semaglutide (OZEMPIC) 2 mg/dose (8 mg/3 mL) pen injector Inject 2 mg subcutaneously once a week Getting from patient assistance sennosides (SENOKOT) 8.6 mg tablet Take 3 tablets by mouth once daily sennosides-docusate (SENOKOT-S) 8.6-50 mg tablet Take 2 tablets by mouth 2 (two) times daily 120 tablet 2 venlafaxine (EFFEXOR-XR) 150 MG XR capsule Take 1 capsule (150 mg total) by mouth once daily 90 capsule 3 venlafaxine (EFFEXOR-XR) 37.5 MG XR capsule TAKE 1 CAPSULE EVERY DAY 90 capsule 3 zolpidem (AMBIEN) 10 mg tablet Take 1 tablet (10 mg total) by mouth at bedtime as needed for Sleep 90 tablet 0  No current facility-administered medications for this visit.  Allergies Allergies Allergen Reactions Iodinated Contrast Media Hives, Itching and Shortness Of Breath Patient states she had itching, hives, and SOB after injection. She states she was given Benadryl before and after injection and still had reaction. Iohexol Itching Breakthrough contrast reaction.  Patient took premedication (13 hr prep) prior to contrasted scan for prior episode itching /nausea/ headache on 01/25/2019 with contrast. Patient experienced breakthrough severe itching and redness of the skin after contrast admin after having taken prep. See Contrast reaction note 04/29/2019 for further details.  Breakthrough contrast reaction. Patient took premedication (13 hr prep) prior to contrasted scan for prior episode itching /nausea/ headache on 01/25/2019 with contrast. Patient experienced breakthrough severe itching and redness of the skin after contrast admin after having taken prep. See Contrast reaction note 04/29/2019 for further details.   Review of Systems A comprehensive 14 point ROS was performed, reviewed, and the pertinent orthopaedic findings are documented in the HPI.  Exam BP 120/70 (BP Location: Left upper arm, Patient Position: Sitting, BP Cuff Size: Adult)  Ht 157.5  cm (5\' 2" )  Wt 83.7 kg (184 lb 9.6 oz)  BMI 33.76 kg/m  General: Well-developed well-nourished female seen in no acute distress.  HEENT: Atraumatic,normocephalic. Pupils are equal and reactive to light.  Oropharynx is clear with moist mucosa  Lungs: Clear to auscultation bilaterally  Cardiovascular: Regular rate and rhythm. Normal S1, S2. No murmurs. No appreciable gallops or rubs. Peripheral pulses are palpable.  Abdomen: Soft, non-tender, nondistended. Bowel sounds present  Extremity: Left Knee: Soft tissue swelling: mild Effusion: none Erythema: none Crepitance: minimal Tenderness: medial Alignment: relative varus Mediolateral laxity: medial pseudolaxity Posterior sag: negative Patellar tracking: Good tracking without evidence of subluxation or tilt Atrophy: No significant atrophy. Quadriceps tone was fair to good. Range of motion: 0/0/108 degrees   Neurological:  The patient is alert and oriented Sensation to light touch appears to be intact and within normal limits Gross motor strength appeared to be equal to 5/5  Vascular :  Peripheral pulses felt to be palpable. Capillary refill appears to be intact and within normal limits  X-ray  1. X-rays taken in [facility] on 10/30/2022 showed significant narrowing of the medial cartilage space with bone-on-bone articulation being noted along with varus alignment. Osteophyte formation as well as subchondral sclerosis was noted. Patella is tracking well. No acute bony abnormalities is noted.  Impression  1. Degenerative arthrosis left knee  Plan  1. Patient's medication was gone over on today's visit past 2. Past medical history reviewed 3. Postop rehab course discussed 4. Patient is planning on going to outpatient rehab following surgery. This is tentatively scheduled for 12/25/2022 at 3:30 PM.  This note was generated in part with voice recognition software and I apologize for any typographical errors that  were not detected and corrected   Tera Partridge PA Electronically signed by Latanya Maudlin, PA at 12/15/2022 3:17 PM EST

## 2022-12-22 NOTE — Progress Notes (Signed)
Patient is not able to walk the distance required to go the bathroom, or he/she is unable to safely negotiate stairs required to access the bathroom.  A 3in1 BSC will alleviate this problem   Liliann File P. Brighton Pilley, Jr. M.D.  

## 2022-12-22 NOTE — Plan of Care (Signed)
Reviewed with patient  verbalizes understanding of plan of care

## 2022-12-22 NOTE — Anesthesia Postprocedure Evaluation (Signed)
Anesthesia Post Note  Patient: Kayla Wu  Procedure(s) Performed: COMPUTER ASSISTED TOTAL KNEE ARTHROPLASTY (Left: Knee)  Patient location during evaluation: PACU Anesthesia Type: Spinal Level of consciousness: oriented and awake and alert Pain management: pain level controlled Vital Signs Assessment: post-procedure vital signs reviewed and stable Respiratory status: spontaneous breathing, respiratory function stable and patient connected to nasal cannula oxygen Cardiovascular status: blood pressure returned to baseline and stable Postop Assessment: no headache, no backache, no apparent nausea or vomiting and spinal receding Anesthetic complications: no   No notable events documented.   Last Vitals:  Vitals:   12/22/22 1100 12/22/22 1115  BP: (!) 123/54   Pulse: (!) 46   Resp: 16   Temp:  36.7 C  SpO2: 99%     Last Pain:  Vitals:   12/22/22 1100  TempSrc:   PainSc: 0-No pain                 Reed Breech

## 2022-12-22 NOTE — Evaluation (Signed)
Physical Therapy Evaluation Patient Details Name: Kayla Wu MRN: [id number] DOB: January 17, 1946 Today's Date: 12/22/2022  History of Present Illness  Pt is 77 y/o admitted 12/22/22 for Left Total Knee Arthroplasty. Procedure date 12/22/22.  Clinical Impression  Pt received in bed on 2L of O2 and agreed to PT session. Pt reports that she currently has a pain scale of 0/10. Pt performed bed mobility SUP, STS with the use of RW (2wheels) CGA on RA, and amb in hallway with RW ~184ft CGA on RA. At the end of session, pt reported pain scale increased to a 5/10. Vitals at the end of session read 87% SpO2, 2L of O2 Kayla Wu was placed back on. Goniometric measurements for left knee flexion: 77 degrees while seated EOB. Goniometric measurements for left knee extension: 3 degrees while seated EOB. Pt tolerated Tx well and will continue to benefit from skilled PT sessions to improve strength, ROM, activity tolerance and functional mobility following D/C.      If plan is discharge home, recommend the following: A little help with walking and/or transfers;Help with stairs or ramp for entrance;Assist for transportation   Can travel by private vehicle        Equipment Recommendations None recommended by PT  Recommendations for Other Services       Functional Status Assessment Patient has had a recent decline in their functional status and demonstrates the ability to make significant improvements in function in a reasonable and predictable amount of time.     Precautions / Restrictions Precautions Precautions: Knee Precaution Booklet Issued: No Restrictions Weight Bearing Restrictions: Yes LLE Weight Bearing: Weight bearing as tolerated      Mobility  Bed Mobility Overal bed mobility: Needs Assistance Bed Mobility: Supine to Sit     Supine to sit: Supervision, Used rails, HOB elevated     General bed mobility comments: Pt performed bed mobility SUP and did not report any s/sx relative to  dizziness while seated EOB    Transfers Overall transfer level: Needs assistance Equipment used: Rolling walker (2 wheels) Transfers: Sit to/from Stand Sit to Stand: Contact guard assist           General transfer comment: Pt performed STS CGA with the use of RW (2wheels) and did not report any s/sx relative to dizziness. VC necessary for RW management.    Ambulation/Gait Ambulation/Gait assistance: Contact guard assist Gait Distance (Feet): 100 Feet Assistive device: Rolling walker (2 wheels) Gait Pattern/deviations: Step-through pattern, Antalgic Gait velocity: decreased     General Gait Details: Pt amb in hallway with the use of RW (2wheels) CGA and demonstrated understanding for WB precautions. Pt reported pain increased to a scale of 5/10. VC necessary for RW management.  Stairs            Wheelchair Mobility     Tilt Bed    Modified Rankin (Stroke Patients Only)       Balance Overall balance assessment: Needs assistance Sitting-balance support: Feet supported Sitting balance-Leahy Scale: Good     Standing balance support: Bilateral upper extremity supported, During functional activity Standing balance-Leahy Scale: Good                               Pertinent Vitals/Pain Pain Assessment Pain Assessment: 0-10 Pain Score: 5  Pain Location: Left knee Pain Descriptors / Indicators: Aching, Constant Pain Intervention(s): Monitored during session, Ice applied    Home Living Family/patient expects to be  discharged to:: Private residence Living Arrangements: Spouse/significant other Available Help at Discharge: Family;Available 24 hours/day Type of Home: House Home Access: Ramped entrance       Home Layout: One level Home Equipment: Agricultural consultant (2 wheels);Cane - single point;BSC/3in1;Shower seat;Grab bars - toilet;Grab bars - tub/shower      Prior Function Prior Level of Function : Independent/Modified Independent              Mobility Comments: Pt reports IND prior to admission. Pt states that with long distances, she would use 2L of O2. ADLs Comments: Pt reports IND prior to admission     Extremity/Trunk Assessment   Upper Extremity Assessment Upper Extremity Assessment: Overall WFL for tasks assessed    Lower Extremity Assessment Lower Extremity Assessment: LLE deficits/detail LLE Deficits / Details: Total Knee Arthroplasty       Communication   Communication Communication: No apparent difficulties Cueing Techniques: Verbal cues  Cognition Arousal: Alert Behavior During Therapy: WFL for tasks assessed/performed Overall Cognitive Status: Within Functional Limits for tasks assessed                                 General Comments: AOx4. Pt pleasant and willing to participate in PT session.        General Comments      Exercises Total Joint Exercises Quad Sets: AROM, Strengthening, 5 reps, Left, Seated Heel Slides: AROM, Strengthening, Left, 5 reps, Seated Straight Leg Raises: AROM, Strengthening, Left, 5 reps, Seated Goniometric ROM: Left knee extension= 3 degrees; Left knee flexion= 77 degrees.   Assessment/Plan    PT Assessment Patient needs continued PT services  PT Problem List Decreased strength;Decreased range of motion;Decreased mobility;Decreased knowledge of use of DME;Pain       PT Treatment Interventions DME instruction;Gait training;Functional mobility training;Therapeutic exercise;Modalities    PT Goals (Current goals can be found in the Care Plan section)  Acute Rehab PT Goals Patient Stated Goal: To go home PT Goal Formulation: With patient/family Time For Goal Achievement: 01/05/23 Potential to Achieve Goals: Good    Frequency BID     Co-evaluation               AM-PAC PT "6 Clicks" Mobility  Outcome Measure Help needed turning from your back to your side while in a flat bed without using bedrails?: None Help needed moving from lying on  your back to sitting on the side of a flat bed without using bedrails?: A Little Help needed moving to and from a bed to a chair (including a wheelchair)?: A Little Help needed standing up from a chair using your arms (e.g., wheelchair or bedside chair)?: A Little Help needed to walk in hospital room?: A Little Help needed climbing 3-5 steps with a railing? : A Little 6 Click Score: 19    End of Session Equipment Utilized During Treatment: Gait belt Activity Tolerance: Patient tolerated treatment well Patient left: in chair;with call bell/phone within reach;with family/visitor present Nurse Communication: Mobility status PT Visit Diagnosis: Other abnormalities of gait and mobility (R26.89);Muscle weakness (generalized) (M62.81);Difficulty in walking, not elsewhere classified (R26.2);Pain Pain - Right/Left: Left Pain - part of body: Knee    Time: 3716-9678 PT Time Calculation (min) (ACUTE ONLY): 19 min   Charges:   PT Evaluation $PT Eval Low Complexity: 1 Low PT Treatments $Gait Training: 8-22 mins PT General Charges $$ ACUTE PT VISIT: 1 Visit  Kortland Nichols [employer] SPT, LAT, ATC  Aivan Fillingim [place] 12/22/2022, 4:55 PM

## 2022-12-22 NOTE — Interval H&P Note (Signed)
History and Physical Interval Note:  12/22/2022 6:23 AM  Kayla Wu  has presented today for surgery, with the diagnosis of Primary osteoarthritis of left knee M17.12.  The various methods of treatment have been discussed with the patient and family. After consideration of risks, benefits and other options for treatment, the patient has consented to  Procedure(s): COMPUTER ASSISTED TOTAL KNEE ARTHROPLASTY (Left) as a surgical intervention.  The patient's history has been reviewed, patient examined, no change in status, stable for surgery.  I have reviewed the patient's chart and labs.  Questions were answered to the patient's satisfaction.     Talissa Apple P Cleota Pellerito

## 2022-12-22 NOTE — Anesthesia Procedure Notes (Signed)
Spinal  Patient location during procedure: OR Start time: 08/17/2022 7:34 AM End time: 08/19/2022 7:34 AM Reason for block: surgical anesthesia Staffing Performed: other anesthesia staff  Other anesthesia staff: Lindell Noe, RN Performed by: Lanell Matar, CRNA Authorized by: Reed Breech, MD   Preanesthetic Checklist Completed: patient identified, IV checked, site marked, risks and benefits discussed, surgical consent, monitors and equipment checked, pre-op evaluation and timeout performed Spinal Block Patient position: sitting Prep: DuraPrep Patient monitoring: heart rate, cardiac monitor, continuous pulse ox and blood pressure Approach: midline Location: L3-4 Injection technique: single-shot Needle Needle type: Sprotte  Needle gauge: 24 G Needle length: 9 cm Assessment Sensory level: T4 Events: CSF return

## 2022-12-23 ENCOUNTER — Encounter: Payer: Self-pay | Admitting: Orthopedic Surgery

## 2022-12-23 DIAGNOSIS — Z8616 Personal history of COVID-19: Secondary | ICD-10-CM | POA: Diagnosis not present

## 2022-12-23 DIAGNOSIS — Z79899 Other long term (current) drug therapy: Secondary | ICD-10-CM | POA: Diagnosis not present

## 2022-12-23 DIAGNOSIS — E119 Type 2 diabetes mellitus without complications: Secondary | ICD-10-CM | POA: Diagnosis not present

## 2022-12-23 DIAGNOSIS — J449 Chronic obstructive pulmonary disease, unspecified: Secondary | ICD-10-CM | POA: Diagnosis not present

## 2022-12-23 DIAGNOSIS — Z86711 Personal history of pulmonary embolism: Secondary | ICD-10-CM | POA: Diagnosis not present

## 2022-12-23 DIAGNOSIS — I10 Essential (primary) hypertension: Secondary | ICD-10-CM | POA: Diagnosis not present

## 2022-12-23 DIAGNOSIS — I480 Paroxysmal atrial fibrillation: Secondary | ICD-10-CM | POA: Diagnosis not present

## 2022-12-23 DIAGNOSIS — Z8543 Personal history of malignant neoplasm of ovary: Secondary | ICD-10-CM | POA: Diagnosis not present

## 2022-12-23 DIAGNOSIS — M1712 Unilateral primary osteoarthritis, left knee: Secondary | ICD-10-CM | POA: Diagnosis not present

## 2022-12-23 LAB — GLUCOSE, CAPILLARY: Glucose-Capillary: 133 mg/dL — ABNORMAL HIGH (ref 70–99)

## 2022-12-23 MED ORDER — ROSUVASTATIN CALCIUM 20 MG PO TABS
ORAL_TABLET | ORAL | Status: AC
  Filled 2022-12-23: qty 1

## 2022-12-23 MED ORDER — OXYCODONE HCL 5 MG PO TABS: 5.0000 mg | ORAL_TABLET | ORAL | 0 refills | Status: DC | PRN

## 2022-12-23 MED ORDER — GABAPENTIN 300 MG PO CAPS
ORAL_CAPSULE | ORAL | Status: AC
  Filled 2022-12-23: qty 2

## 2022-12-23 MED ORDER — MAGNESIUM HYDROXIDE 400 MG/5ML PO SUSP
ORAL | Status: AC
Start: 2022-12-23 — End: ?
  Filled 2022-12-23: qty 30

## 2022-12-23 MED ORDER — INSULIN ASPART 100 UNIT/ML IJ SOLN
INTRAMUSCULAR | Status: AC
  Filled 2022-12-23: qty 1

## 2022-12-23 MED ORDER — DILTIAZEM HCL ER COATED BEADS 180 MG PO CP24
ORAL_CAPSULE | ORAL | Status: AC
  Filled 2022-12-23: qty 1

## 2022-12-23 MED ORDER — SENNOSIDES-DOCUSATE SODIUM 8.6-50 MG PO TABS
ORAL_TABLET | ORAL | Status: AC
Start: 2022-12-23 — End: ?
  Filled 2022-12-23: qty 1

## 2022-12-23 MED ORDER — FERROUS SULFATE 325 (65 FE) MG PO TABS
ORAL_TABLET | ORAL | Status: AC
  Filled 2022-12-23: qty 1

## 2022-12-23 MED ORDER — CELECOXIB 200 MG PO CAPS
ORAL_CAPSULE | ORAL | Status: AC
  Filled 2022-12-23: qty 1

## 2022-12-23 MED ORDER — METOCLOPRAMIDE HCL 10 MG PO TABS
ORAL_TABLET | ORAL | Status: AC
  Filled 2022-12-23: qty 1

## 2022-12-23 MED ORDER — CELECOXIB 200 MG PO CAPS: 200.0000 mg | ORAL_CAPSULE | Freq: Two times a day (BID) | ORAL | 1 refills | Status: DC

## 2022-12-23 MED ORDER — TRAMADOL HCL 50 MG PO TABS: 50.0000 mg | ORAL_TABLET | ORAL | 0 refills | Status: AC | PRN

## 2022-12-23 MED ORDER — PANTOPRAZOLE SODIUM 40 MG PO TBEC
DELAYED_RELEASE_TABLET | ORAL | Status: AC
Start: 2022-12-23 — End: ?
  Filled 2022-12-23: qty 1

## 2022-12-23 MED ORDER — ACETAMINOPHEN 10 MG/ML IV SOLN
INTRAVENOUS | Status: AC
  Filled 2022-12-23: qty 100

## 2022-12-23 NOTE — Plan of Care (Signed)
  Problem: Activity: Goal: Ability to avoid complications of mobility impairment will improve Outcome: Progressing   Problem: Pain Management: Goal: Pain level will decrease with appropriate interventions Outcome: Progressing   Problem: Skin Integrity: Goal: Will show signs of wound healing Outcome: Progressing   

## 2022-12-23 NOTE — Evaluation (Signed)
Occupational Therapy Evaluation Patient Details Name: Kayla Wu MRN: [id number] DOB: 1945-05-21 Today's Date: 12/23/2022   History of Present Illness Pt is 77 y/o admitted 12/22/22 for Left Total Knee Arthroplasty. Procedure date 12/22/22.   Clinical Impression   Pt was seen for OT evaluation this date. Prior to hospital admission, pt was Independent with ADL's. Pt lives with husband and he helps with IADL's as needed. Upon arrival to room pt supine in bed with husband at bedside, agreeable to tx. Pt educated on the [facility] care system and given handout. Pt and husband verbalized understanding. Pt completed all bed mobility with supervision. Pt completed upper and lower body dressing from sit/to stand with supervision. No AD used during session, no noted LOB. Pt offered bathroom use, pt declined. Pt returned to bed with all needs met. OT signing off. Do not anticipate the need for follow up OT services upon acute hospital DC.      If plan is discharge home, recommend the following: A little help with bathing/dressing/bathroom;Assistance with cooking/housework;Help with stairs or ramp for entrance;Assist for transportation;A little help with walking and/or transfers    Functional Status Assessment  Patient has had a recent decline in their functional status and demonstrates the ability to make significant improvements in function in a reasonable and predictable amount of time.  Equipment Recommendations  None recommended by OT    Recommendations for Other Services       Precautions / Restrictions Precautions Precautions: Knee Precaution Booklet Issued: No Restrictions Weight Bearing Restrictions: Yes LLE Weight Bearing: Weight bearing as tolerated      Mobility Bed Mobility Overal bed mobility: Needs Assistance Bed Mobility: Supine to Sit, Sit to Supine     Supine to sit: Supervision, Used rails, HOB elevated Sit to supine: Supervision     Patient Response:  Cooperative  Transfers Overall transfer level: Needs assistance Equipment used: None Transfers: Sit to/from Stand Sit to Stand: Contact guard assist                  Balance Overall balance assessment: Needs assistance Sitting-balance support: Feet supported Sitting balance-Leahy Scale: Good     Standing balance support: During functional activity, No upper extremity supported Standing balance-Leahy Scale: Good                             ADL either performed or assessed with clinical judgement   ADL Overall ADL's : Needs assistance/impaired                                     Functional mobility during ADLs: Supervision/safety;Set up General ADL Comments: Pt educated in [facility] care system and given handout. Pt and husband verbalized understanding. Pt completed upper and lower body dressing from sit/to stand with supervision. No AD used during session, no noted LOB. Pt offered bathroom use, pt declined.     Vision Patient Visual Report: No change from baseline       Perception         Praxis         Pertinent Vitals/Pain Pain Assessment Pain Assessment: Faces Faces Pain Scale: Hurts a little bit Pain Descriptors / Indicators: Sore Pain Intervention(s): Ice applied, Monitored during session     Extremity/Trunk Assessment Upper Extremity Assessment Upper Extremity Assessment: Overall WFL for tasks assessed   Lower Extremity Assessment Lower Extremity Assessment:  LLE deficits/detail LLE Deficits / Details: Total Knee Arthroplasty   Cervical / Trunk Assessment Cervical / Trunk Assessment: Normal   Communication Communication Communication: No apparent difficulties Cueing Techniques: Verbal cues   Cognition Arousal: Alert Behavior During Therapy: WFL for tasks assessed/performed Overall Cognitive Status: Within Functional Limits for tasks assessed                                 General Comments: AOx4. Pt  pleasant and willing to participate in OT session.     General Comments       Exercises     Shoulder Instructions      Home Living Family/patient expects to be discharged to:: Private residence Living Arrangements: Spouse/significant other Available Help at Discharge: Family;Available 24 hours/day Type of Home: House Home Access: Ramped entrance     Home Layout: One level     Bathroom Shower/Tub: Tub/shower unit;Walk-in shower   Bathroom Toilet: Handicapped height     Home Equipment: Agricultural consultant (2 wheels);Cane - single point;BSC/3in1;Shower seat;Grab bars - toilet;Grab bars - tub/shower;Hand held shower head;Rollator (4 wheels)          Prior Functioning/Environment Prior Level of Function : Independent/Modified Independent             Mobility Comments: Pt reports IND prior to admission. Pt states that with long distances, she would use 2L of O2. ADLs Comments: Pt reports IND with all ADL's and husband helps with IADL's        OT Problem List:        OT Treatment/Interventions:      OT Goals(Current goals can be found in the care plan section) Acute Rehab OT Goals Patient Stated Goal: to go home OT Goal Formulation: With patient/family Time For Goal Achievement: 12/23/22 Potential to Achieve Goals: Good  OT Frequency:      Co-evaluation              AM-PAC OT "6 Clicks" Daily Activity     Outcome Measure Help from another person eating meals?: None Help from another person taking care of personal grooming?: None Help from another person toileting, which includes using toliet, bedpan, or urinal?: A Little Help from another person bathing (including washing, rinsing, drying)?: A Little Help from another person to put on and taking off regular upper body clothing?: None Help from another person to put on and taking off regular lower body clothing?: A Little 6 Click Score: 21   End of Session Nurse Communication: Mobility status  Activity  Tolerance: Patient tolerated treatment well Patient left: in bed;with call bell/phone within reach;with bed alarm set;with family/visitor present                   Time: 4098-1191 OT Time Calculation (min): 18 min Charges:     Butch Penny, SOT

## 2022-12-23 NOTE — Progress Notes (Signed)
Physical Therapy Treatment Patient Details Name: Kayla Wu MRN: [id number] DOB: 1945/03/20 Today's Date: 12/23/2022   History of Present Illness Pt is 77 y/o admitted 12/22/22 for Left Total Knee Arthroplasty. Procedure date 12/22/22.    PT Comments  Pt received in bed with husband at bedside and agreed to PT session. Pt denies pain or discomfort, however expresses concern for SOB with amb. Pt performed bed mobility SUP, STS with the use of RW (2wheels) CGA, amb in hallway with RW and completed stair negotiation CGA. Goniometric measurements read: Left knee flexion= 93 degrees; Left knee extension= 0 degrees. Pt demonstrated/expressed understanding for PT TKR HEP packet and reported that their first outpatient appointment will occur tomorrow. During amb pt used their at home O2 to amb in hallway and perform stair negotiation on 2L [place]. Vitals throughout session read: 92-96% SpO2 on 2L of O2 [place]. Due to generalized fatigue following amb and stair negotiation, pt ended session in bed on 2L of O2. With all goals met, pt will no longer need skilled PT sessions in the acute care setting.    If plan is discharge home, recommend the following:     Can travel by private vehicle        Equipment Recommendations       Recommendations for Other Services       Precautions / Restrictions Precautions Precautions: Knee Precaution Booklet Issued: Yes (comment) Precaution Comments: PT TKR HEP packet provided Restrictions Weight Bearing Restrictions: Yes LLE Weight Bearing: Weight bearing as tolerated     Mobility  Bed Mobility Overal bed mobility: Needs Assistance Bed Mobility: Supine to Sit, Sit to Supine     Supine to sit: Supervision, Used rails, HOB elevated Sit to supine: Supervision, HOB elevated   General bed mobility comments: Pt performed bed mobility SUP and did not report any s/sx relative to dizziness while seated EOB    Transfers Overall transfer level: Needs  assistance Equipment used: Rolling walker (2 wheels) Transfers: Sit to/from Stand Sit to Stand: Contact guard assist           General transfer comment: Pt performed STS CGA with the use of RW (2wheels) and did not report any s/sx relative to dizziness.    Ambulation/Gait Ambulation/Gait assistance: Contact guard assist Gait Distance (Feet): 150 Feet Assistive device: Rolling walker (2 wheels) Gait Pattern/deviations: Step-through pattern Gait velocity: decreased     General Gait Details: Pt amb in hallway with the use of RW (2wheels) CGA on 2L of O2 and demonstrated understanding for WB precautions. VC necessary for RW management   Stairs Stairs: Yes Stairs assistance: Contact guard assist Stair Management: Two rails, Step to pattern Number of Stairs: 8 General stair comments: Pt performed stair negotiation CGA and demonstrated/expressed comfortability with activity   Wheelchair Mobility     Tilt Bed    Modified Rankin (Stroke Patients Only)       Balance Overall balance assessment: Needs assistance Sitting-balance support: Feet supported Sitting balance-Leahy Scale: Good     Standing balance support: During functional activity, Bilateral upper extremity supported Standing balance-Leahy Scale: Good                              Cognition Arousal: Alert Behavior During Therapy: WFL for tasks assessed/performed Overall Cognitive Status: Within Functional Limits for tasks assessed  General Comments: Pt pleasant and willing to participate in PT session.        Exercises Total Joint Exercises Ankle Circles/Pumps: AROM, Strengthening, Both, 5 reps, Seated Quad Sets: AROM, Strengthening, 5 reps, Left, Seated Short Arc Quad: AROM, Strengthening, Left, 5 reps, Seated Heel Slides: AROM, Strengthening, Left, 5 reps, Seated Hip ABduction/ADduction: AROM, Strengthening, Left, 5 reps, Seated Straight Leg  Raises: AROM, Strengthening, Left, 5 reps, Seated Long Arc Quad: AROM, Strengthening, Left, 5 reps, Seated Goniometric ROM: Left knee flexion= 93 degrees; Left knee extension= 0 degrees    General Comments        Pertinent Vitals/Pain Pain Assessment Pain Assessment: No/denies pain Pain Location: Left knee Pain Intervention(s): Monitored during session, Ice applied    Home Living Family/patient expects to be discharged to:: Private residence Living Arrangements: Spouse/significant other Available Help at Discharge: Family;Available 24 hours/day Type of Home: House Home Access: Ramped entrance       Home Layout: One level Home Equipment: Agricultural consultant (2 wheels);Cane - single point;BSC/3in1;Shower seat;Grab bars - toilet;Grab bars - tub/shower;Hand held shower head;Rollator (4 wheels)      Prior Function            PT Goals (current goals can now be found in the care plan section) Acute Rehab PT Goals Patient Stated Goal: To go home PT Goal Formulation: With patient/family Time For Goal Achievement: 01/05/23 Potential to Achieve Goals: Good Progress towards PT goals: Goals met/education completed, patient discharged from PT    Frequency    BID      PT Plan      Co-evaluation              AM-PAC PT "6 Clicks" Mobility   Outcome Measure  Help needed turning from your back to your side while in a flat bed without using bedrails?: A Little Help needed moving from lying on your back to sitting on the side of a flat bed without using bedrails?: A Little Help needed moving to and from a bed to a chair (including a wheelchair)?: A Little Help needed standing up from a chair using your arms (e.g., wheelchair or bedside chair)?: A Little Help needed to walk in hospital room?: A Little Help needed climbing 3-5 steps with a railing? : A Little 6 Click Score: 18    End of Session Equipment Utilized During Treatment: Gait belt;Oxygen Activity Tolerance: Patient  tolerated treatment well Patient left: in bed;with call bell/phone within reach;with family/visitor present Nurse Communication: Mobility status PT Visit Diagnosis: Other abnormalities of gait and mobility (R26.89);Muscle weakness (generalized) (M62.81);Difficulty in walking, not elsewhere classified (R26.2);Pain Pain - Right/Left: Left Pain - part of body: Knee     Time: 0910-0933 PT Time Calculation (min) (ACUTE ONLY): 23 min  Charges:    $Gait Training: 8-22 mins $Therapeutic Exercise: 8-22 mins PT General Charges $$ ACUTE PT VISIT: 1 Visit                     Lloyd Cullinan [place] SPT, LAT, ATC   Aviance Cooperwood [facility] 12/23/2022, 1:14 PM

## 2022-12-23 NOTE — TOC Initial Note (Signed)
Transition of Care [facility]) - Initial/Assessment Note    Patient Details  Name: Kayla Wu MRN: [id number] Date of Birth: [birth year]  Transition of Care [facility]) CM/SW Contact:    Marlowe Sax, RN Phone Number: 12/23/2022, 9:04 AM  Clinical Narrative:                 The patient is from home with spouse, she is set up with [facility] prior to surgery by surgeons office, she has DME at home [employer] (2 wheels);Cane - single point;BSC/3in1;Shower seat;Grab bars - toilet;Grab bars - tub/shower   Expected Discharge Plan: Home w Home Health Services Barriers to Discharge: No Barriers Identified   Patient Goals and CMS Choice            Expected Discharge Plan and Services   Discharge Planning Services: CM Consult   Living arrangements for the past 2 months: Single Family Home Expected Discharge Date: 12/23/22               DME Arranged: N/A DME Agency: NA       HH Arranged: PT, OT HH Agency: [facility] Date HH Agency Contacted: 12/23/22 Time HH Agency Contacted: [phone] Representative spoke with at [facility] Agency: [place]  Prior Living Arrangements/Services Living arrangements for the past 2 months: Single Family Home Lives with:: Spouse   Do you feel safe going back to the place where you live?: Yes      Need for Family Participation in Patient Care: Yes (Comment)   Current home services: DME Adult nurse (2 wheels);Cane - single point;BSC/3in1;Shower seat;Grab bars - toilet;Grab bars - tub/shower) Criminal Activity/Legal Involvement Pertinent to Current Situation/Hospitalization: No - Comment as needed  Activities of Daily Living   ADL Screening (condition at time of admission) Independently performs ADLs?: Yes (appropriate for developmental age) Is the patient deaf or have difficulty hearing?: No Does the patient have difficulty seeing, even when wearing glasses/contacts?: No Does the patient have difficulty concentrating, remembering, or  making decisions?: No  Permission Sought/Granted   Permission granted to share information with : Yes, Verbal Permission Granted              Emotional Assessment Appearance:: Appears stated age Attitude/Demeanor/Rapport: Gracious Affect (typically observed): Accepting, Pleasant Orientation: : Oriented to Self, Oriented to Place, Oriented to  Time, Oriented to Situation Alcohol / Substance Use: Not Applicable Psych Involvement: No (comment)  Admission diagnosis:  History of total knee arthroplasty, left [Z96.652] Patient Active Problem List   Diagnosis Date Noted   Aortic atherosclerosis (HCC) 12/22/2022   History of total knee arthroplasty, left 12/22/2022   Venous obstruction 11/26/2022   DJD (degenerative joint disease) 11/26/2022   Osteoarthritis of carpometacarpal (CMC) joint of thumb 11/02/2022   Primary osteoarthritis of left knee 11/02/2022   Elevated INR 04/22/2021   Pulmonary embolism (HCC) 09/25/2020   Constipation 09/23/2020   Atrial fibrillation with RVR (HCC) 06/29/2020   Acute hypoxemic respiratory failure due to COVID-19 (HCC) 06/25/2020   Sepsis due to urinary tract infection (HCC) 06/25/2020   COPD with acute exacerbation (HCC) 01/19/2020   Diabetes mellitus (HCC)    Hypomagnesemia 02/25/2019   Localized, primary osteoarthritis of hand 01/19/2018   Chronic anemia 01/04/2018   Tremor 03/20/2016   Low serum cortisol level 07/24/2015   Encounter for antineoplastic chemotherapy 03/16/2015   Fistula of vagina to large intestine 02/26/2015   Rectal vaginal fistula 02/26/2015   Type II diabetes mellitus with complication (HCC) 10/11/2014   Dyspnea on exertion  07/19/2014   Chronic obstructive pulmonary disease (HCC) 04/20/2013   Narrowing of intervertebral disc space 04/20/2013   Benign essential HTN 04/20/2013   HLD (hyperlipidemia) 04/20/2013   Benign hypertension 04/20/2013   Panlobular emphysema (HCC) 04/20/2013   Pure hypercholesterolemia 04/20/2013    Multilevel degenerative disc disease 04/20/2013   COPD pft's pending 07/08/2012   Chronic obstructive pulmonary disease with acute exacerbation (HCC) 07/08/2012   Disorder of magnesium metabolism 01/09/2011   GERD (gastroesophageal reflux disease) 01/09/2011   Malignant neoplasm of ovary (HCC) 12/16/2010   PCP:  Marguarite Arbour, MD Pharmacy:   [facility] [address], [place] - [address] [place] [address] Phone: [phone] Fax: [phone]  [facility] - [place], [place] - [address] [place] [address] Phone: [phone] Fax: [phone]     Social Determinants of Health (SDOH) Social History: SDOH Screenings   Food Insecurity: No Food Insecurity (12/22/2022)  Housing: Low Risk  (12/22/2022)  Transportation Needs: No Transportation Needs (12/22/2022)  Utilities: Not At Risk (12/22/2022)  Depression (PHQ2-9): High Risk (12/25/2021)  Financial Resource Strain: Low Risk  (08/21/2022)   Received [facility]  Tobacco Use: Medium Risk (12/22/2022)   SDOH Interventions:     Readmission Risk Interventions     No data to display

## 2022-12-23 NOTE — Progress Notes (Signed)
Patient given discharge instructions. TED hose placed on bilateral LE; two honeycomb dressings sent with patient. Patient wheeled downstairs via wheelchair to private vehicle. Husband providing transportation.

## 2022-12-24 DIAGNOSIS — G8929 Other chronic pain: Secondary | ICD-10-CM | POA: Diagnosis not present

## 2022-12-24 DIAGNOSIS — M25562 Pain in left knee: Secondary | ICD-10-CM | POA: Diagnosis not present

## 2022-12-24 DIAGNOSIS — Z96652 Presence of left artificial knee joint: Secondary | ICD-10-CM | POA: Diagnosis not present

## 2022-12-24 DIAGNOSIS — R531 Weakness: Secondary | ICD-10-CM | POA: Diagnosis not present

## 2022-12-24 DIAGNOSIS — M25662 Stiffness of left knee, not elsewhere classified: Secondary | ICD-10-CM | POA: Diagnosis not present

## 2022-12-26 DIAGNOSIS — M25662 Stiffness of left knee, not elsewhere classified: Secondary | ICD-10-CM | POA: Diagnosis not present

## 2022-12-26 DIAGNOSIS — G8929 Other chronic pain: Secondary | ICD-10-CM | POA: Diagnosis not present

## 2022-12-26 DIAGNOSIS — Z96652 Presence of left artificial knee joint: Secondary | ICD-10-CM | POA: Diagnosis not present

## 2022-12-26 DIAGNOSIS — R531 Weakness: Secondary | ICD-10-CM | POA: Diagnosis not present

## 2022-12-26 DIAGNOSIS — M25562 Pain in left knee: Secondary | ICD-10-CM | POA: Diagnosis not present

## 2022-12-29 DIAGNOSIS — N179 Acute kidney failure, unspecified: Secondary | ICD-10-CM | POA: Diagnosis not present

## 2022-12-29 DIAGNOSIS — I10 Essential (primary) hypertension: Secondary | ICD-10-CM | POA: Diagnosis not present

## 2022-12-29 DIAGNOSIS — R1110 Vomiting, unspecified: Secondary | ICD-10-CM | POA: Diagnosis not present

## 2022-12-29 DIAGNOSIS — E875 Hyperkalemia: Secondary | ICD-10-CM | POA: Diagnosis not present

## 2022-12-29 DIAGNOSIS — E785 Hyperlipidemia, unspecified: Secondary | ICD-10-CM | POA: Diagnosis not present

## 2022-12-29 DIAGNOSIS — E860 Dehydration: Secondary | ICD-10-CM | POA: Diagnosis not present

## 2022-12-29 DIAGNOSIS — I959 Hypotension, unspecified: Secondary | ICD-10-CM | POA: Diagnosis not present

## 2022-12-29 DIAGNOSIS — I480 Paroxysmal atrial fibrillation: Secondary | ICD-10-CM | POA: Diagnosis not present

## 2022-12-29 DIAGNOSIS — J439 Emphysema, unspecified: Secondary | ICD-10-CM | POA: Diagnosis not present

## 2022-12-29 DIAGNOSIS — F32A Depression, unspecified: Secondary | ICD-10-CM | POA: Diagnosis not present

## 2022-12-29 DIAGNOSIS — M25562 Pain in left knee: Secondary | ICD-10-CM | POA: Diagnosis not present

## 2022-12-29 DIAGNOSIS — K5900 Constipation, unspecified: Secondary | ICD-10-CM | POA: Diagnosis not present

## 2022-12-29 DIAGNOSIS — N200 Calculus of kidney: Secondary | ICD-10-CM | POA: Diagnosis not present

## 2022-12-29 DIAGNOSIS — E119 Type 2 diabetes mellitus without complications: Secondary | ICD-10-CM | POA: Diagnosis not present

## 2022-12-29 DIAGNOSIS — R55 Syncope and collapse: Secondary | ICD-10-CM | POA: Diagnosis not present

## 2022-12-29 DIAGNOSIS — K219 Gastro-esophageal reflux disease without esophagitis: Secondary | ICD-10-CM | POA: Diagnosis not present

## 2022-12-29 DIAGNOSIS — I9589 Other hypotension: Secondary | ICD-10-CM | POA: Diagnosis not present

## 2022-12-29 DIAGNOSIS — R001 Bradycardia, unspecified: Secondary | ICD-10-CM | POA: Diagnosis not present

## 2022-12-29 DIAGNOSIS — Z96652 Presence of left artificial knee joint: Secondary | ICD-10-CM | POA: Diagnosis not present

## 2022-12-29 DIAGNOSIS — G8929 Other chronic pain: Secondary | ICD-10-CM | POA: Diagnosis not present

## 2022-12-29 DIAGNOSIS — R110 Nausea: Secondary | ICD-10-CM | POA: Diagnosis not present

## 2022-12-29 DIAGNOSIS — E861 Hypovolemia: Secondary | ICD-10-CM | POA: Diagnosis not present

## 2022-12-29 DIAGNOSIS — J431 Panlobular emphysema: Secondary | ICD-10-CM | POA: Diagnosis not present

## 2022-12-29 DIAGNOSIS — K529 Noninfective gastroenteritis and colitis, unspecified: Secondary | ICD-10-CM | POA: Diagnosis not present

## 2022-12-29 DIAGNOSIS — G4733 Obstructive sleep apnea (adult) (pediatric): Secondary | ICD-10-CM | POA: Diagnosis not present

## 2023-01-06 DIAGNOSIS — M25562 Pain in left knee: Secondary | ICD-10-CM | POA: Diagnosis not present

## 2023-01-06 DIAGNOSIS — Z9889 Other specified postprocedural states: Secondary | ICD-10-CM | POA: Diagnosis not present

## 2023-01-06 DIAGNOSIS — M25662 Stiffness of left knee, not elsewhere classified: Secondary | ICD-10-CM | POA: Diagnosis not present

## 2023-01-06 DIAGNOSIS — G8929 Other chronic pain: Secondary | ICD-10-CM | POA: Diagnosis not present

## 2023-01-06 DIAGNOSIS — R531 Weakness: Secondary | ICD-10-CM | POA: Diagnosis not present

## 2023-01-09 DIAGNOSIS — E119 Type 2 diabetes mellitus without complications: Secondary | ICD-10-CM | POA: Diagnosis not present

## 2023-01-09 DIAGNOSIS — E7800 Pure hypercholesterolemia, unspecified: Secondary | ICD-10-CM | POA: Diagnosis not present

## 2023-01-09 DIAGNOSIS — I10 Essential (primary) hypertension: Secondary | ICD-10-CM | POA: Diagnosis not present

## 2023-01-09 DIAGNOSIS — Z79899 Other long term (current) drug therapy: Secondary | ICD-10-CM | POA: Diagnosis not present

## 2023-01-09 DIAGNOSIS — Z794 Long term (current) use of insulin: Secondary | ICD-10-CM | POA: Diagnosis not present

## 2023-01-13 DIAGNOSIS — Z9889 Other specified postprocedural states: Secondary | ICD-10-CM | POA: Diagnosis not present

## 2023-01-13 DIAGNOSIS — M25662 Stiffness of left knee, not elsewhere classified: Secondary | ICD-10-CM | POA: Diagnosis not present

## 2023-01-13 DIAGNOSIS — G8929 Other chronic pain: Secondary | ICD-10-CM | POA: Diagnosis not present

## 2023-01-13 DIAGNOSIS — R531 Weakness: Secondary | ICD-10-CM | POA: Diagnosis not present

## 2023-01-13 DIAGNOSIS — M25562 Pain in left knee: Secondary | ICD-10-CM | POA: Diagnosis not present

## 2023-01-14 ENCOUNTER — Other Ambulatory Visit (INDEPENDENT_AMBULATORY_CARE_PROVIDER_SITE_OTHER): Payer: Self-pay | Admitting: Vascular Surgery

## 2023-01-14 DIAGNOSIS — Z86718 Personal history of other venous thrombosis and embolism: Secondary | ICD-10-CM

## 2023-01-14 DIAGNOSIS — Z95828 Presence of other vascular implants and grafts: Secondary | ICD-10-CM

## 2023-01-14 NOTE — Progress Notes (Signed)
MRN : [id number]  Kayla Wu is a 77 y.o. (Jun 06, 1945) female who presents with chief complaint of legs hurt and swell.  History of Present Illness:   The patient presents to the office for follow-up status post IVC filter placement prior to joint replacement surgery.    The patient is status post successful total left knee replacement on December 22, 2022.  The patient has a history of DVT and therefore was at high risk for recurrence in the perioperative timeframe after undergoing a total joint replacement.    The patient notes the affected leg is not increasingly swollen or painful.  No signs and symptoms consistent with recurrence of DVT in the lower extremity.  Patient does note that as a baseline the affected extremity tends to swell with prolonged dependence, symptoms are much better with elevation.  The patient notes minimal edema in the morning which steadily worsens throughout the day.  The patient has been doing well with rehab and their mobility is increasing.  The patient has not been using compression therapy at this point.  The patient is not currently on anticoagulation.  No SOB or pleuritic chest pains.  No cough or hemoptysis.  No blood per rectum or blood in any sputum.  No excessive bruising per the patient.   No recent shortening of the patient's walking distance or new symptoms consistent with claudication.  No history of rest pain symptoms. No new ulcers or wounds of the lower extremities have occurred.  The patient denies amaurosis fugax or recent TIA symptoms. There are no recent neurological changes noted. No recent episodes of angina or shortness of breath documented.   Duplex ultrasound of the venous system bilateral lower extremities done today is negative for DVT.   No outpatient medications have been marked as taking for the 01/15/23 encounter (Appointment) with Gilda Crease, Latina Craver, MD.    Past Medical History:  Diagnosis Date    Anxiety    a.) on BZO PRN (lorazepam)   Aortic atherosclerosis (HCC)    CAD (coronary artery disease)    a.) MV 05/23/2014: EF 75%, no ischemia/artifact; b.) cCTA 09/16/2021: Ca2+ 340 (79th %ile) in pLAD and pLCx distributions   Chronic hypoxemic respiratory failure (HCC)    Chronic urticaria    COPD (chronic obstructive pulmonary disease) (HCC)    DDD (degenerative disc disease), lumbar    Depression    Diastolic dysfunction    a.) TTE 05/23/2014: EF >55%, no RWMAs, G1DD, triv AR, mild MR, RVSP 37.1; b.) TTE 01/19/2018: EF >55%, mild LVH, no RWMAs, G1DD, mild LAE, triv AR/MR/PR, mild TR, RVSP 36.1; c.) TTE 06/29/2020: EF 60-65%, no RWMAs, G1DD, RVSP 14.9   GERD (gastroesophageal reflux disease)    Hepatic steatosis    History of 2019 novel coronavirus disease (COVID-19) 06/25/2020   HLD (hyperlipidemia)    Hypertension    Insomnia    a.) on hypnotic (zolpidem)   Menopausal state    Morbid obesity (HCC)    Non-cardiac chest pain    On rivaroxaban therapy    Osteoarthritis    Ovarian cancer (HCC) 2012   a.) s/p Tx with systemic chemotherapy   Oxygen desaturation during sleep    a.) on 1-2 L/Garza-[place] II supplemental oxygen while sleeping   PAF (paroxysmal atrial fibrillation) (HCC) 06/2020   a.) Dx'd in setting of SARS-CoV-2 infection; b.) CHA2DS2-VASc = 8 (age x2, sex, HTN, VTE x2, vascular disease history, T2DM) as of 12/19/2022; c.) cardiac rate/rhythm  maintained on oral diltiazem; chronically anticoagulated using rivaroxaban   Pneumonia 1987   Rectovaginal fistula    Sleep apnea    a.) does not utilize nocturnal PAP therapy   T2DM (type 2 diabetes mellitus) (HCC)    VTE (venous thromboembolism) 06/2020   a.) developed in setting of SARS-CoV-2 infection and A.fib with RVR    Past Surgical History:  Procedure Laterality Date   ABDOMINAL HYSTERECTOMY     APPENDECTOMY     CHOLECYSTECTOMY     ESOPHAGOGASTRODUODENOSCOPY (EGD) WITH PROPOFOL N/A 06/28/2021   Procedure:  ESOPHAGOGASTRODUODENOSCOPY (EGD) WITH PROPOFOL;  Surgeon: Regis Bill, MD;  Location: [facility] ENDOSCOPY;  Service: Endoscopy;  Laterality: N/A;  DM, ON WARFARIN   FINGER ARTHROPLASTY Left 12/22/2014   Procedure: FINGER ARTHROPLASTY;  Surgeon: Myra Rude, MD;  Location: [facility] ORS;  Service: Orthopedics;  Laterality: Left;   IVC FILTER INSERTION N/A 12/16/2022   Procedure: IVC FILTER INSERTION;  Surgeon: Renford Dills, MD;  Location: [facility] INVASIVE CV LAB;  Service: Cardiovascular;  Laterality: N/A;   KNEE ARTHROPLASTY Left 12/22/2022   Procedure: COMPUTER ASSISTED TOTAL KNEE ARTHROPLASTY;  Surgeon: Donato Heinz, MD;  Location: [facility] ORS;  Service: Orthopedics;  Laterality: Left;   KNEE ARTHROSCOPY WITH SUBCHONDROPLASTY Left 06/27/2022   Procedure: Left knee arthroscopic chondroplasty, possible partial meniscectomy, and subchondroplasty to the medial femoral condyle;  Surgeon: Signa Kell, MD;  Location: [facility];  Service: Orthopedics;  Laterality: Left;  Diabetic    Social History Social History   Tobacco Use   Smoking status: Former    Current packs/day: 0.00    Average packs/day: 2.0 packs/day for 42.0 years (84.0 ttl pk-yrs)    Types: Cigarettes    Start date: 07/04/1965    Quit date: 07/05/2007    Years since quitting: 15.5   Smokeless tobacco: Never  Vaping Use   Vaping status: Never Used  Substance Use Topics   Alcohol use: No    Comment: occasional glass of wine once a month   Drug use: No    Family History Family History  Problem Relation Age of Onset   Breast cancer Mother 37   Emphysema Father        smoked   Colon cancer Sister    Uterine cancer Sister    Throat cancer Brother     Allergies  Allergen Reactions   Iodinated Contrast Media Hives, Shortness Of Breath and Itching    Patient states she had itching, hives, and SOB after injection.    Iohexol Itching    Breakthrough contrast reaction.  Patient took premedication (13 hr prep)  prior to contrasted scan for prior episode itching /nausea/ headache on 01/25/2019 with contrast. Patient experienced breakthrough severe itching and redness of the skin after contrast admin after having taken prep. See Contrast reaction note 04/29/2019 for further details.     REVIEW OF SYSTEMS (Negative unless checked)  Constitutional: [] Weight loss  [] Fever  [] Chills Cardiac: [] Chest pain   [] Chest pressure   [] Palpitations   [] Shortness of breath when laying flat   [] Shortness of breath with exertion. Vascular:  [] Pain in legs with walking   [x] Pain in legs at rest  [] History of DVT   [] Phlebitis   [x] Swelling in legs   [] Varicose veins   [] Non-healing ulcers Pulmonary:   [] Uses home oxygen   [] Productive cough   [] Hemoptysis   [] Wheeze  [] COPD   [] Asthma Neurologic:  [] Dizziness   [] Seizures   [] History of stroke   [] History of  TIA  [] Aphasia   [] Vissual changes   [] Weakness or numbness in arm   [] Weakness or numbness in leg Musculoskeletal:   [] Joint swelling   [] Joint pain   [] Low back pain Hematologic:  [] Easy bruising  [] Easy bleeding   [] Hypercoagulable state   [] Anemic Gastrointestinal:  [] Diarrhea   [] Vomiting  [] Gastroesophageal reflux/heartburn   [] Difficulty swallowing. Genitourinary:  [] Chronic kidney disease   [] Difficult urination  [] Frequent urination   [] Blood in urine Skin:  [] Rashes   [] Ulcers  Psychological:  [] History of anxiety   []  History of major depression.  Physical Examination  There were no vitals filed for this visit. There is no height or weight on file to calculate BMI. Gen: WD/WN, NAD Head: [place]/AT, No temporalis wasting.  Ear/Nose/Throat: Hearing grossly intact, nares w/o erythema or drainage, pinna without lesions Eyes: PER, EOMI, sclera nonicteric.  Neck: Supple, no gross masses.  No JVD.  Pulmonary:  Good air movement, no audible wheezing, no use of accessory muscles.  Cardiac: RRR, precordium not hyperdynamic. Vascular:  scattered varicosities  present bilaterally.  Moderate venous stasis changes to the legs bilaterally.  2+ soft pitting edema. CEAP C4sEpAsPr   Vessel Right Left  Radial Palpable Palpable  Gastrointestinal: soft, non-distended. No guarding/no peritoneal signs.  Musculoskeletal: M/S 5/5 throughout.  No deformity.  Neurologic: CN 2-12 intact. Pain and light touch intact in extremities.  Symmetrical.  Speech is fluent. Motor exam as listed above. Psychiatric: Judgment intact, Mood & affect appropriate for pt's clinical situation. Dermatologic: Venous rashes no ulcers noted.  No changes consistent with cellulitis. Lymph : No lichenification or skin changes of chronic lymphedema.  CBC Lab Results  Component Value Date   WBC 12.0 (H) 10/09/2020   HGB 11.9 (L) 10/09/2020   HCT 35.8 (L) 10/09/2020   MCV 95.2 10/09/2020   PLT 440 (H) 10/09/2020    BMET    Component Value Date/Time   NA 137 09/05/2021 1244   K 4.4 09/05/2021 1244   CL 107 09/05/2021 1244   CO2 21 (L) 09/05/2021 1244   GLUCOSE 97 09/05/2021 1244   BUN 18 09/05/2021 1244   CREATININE 0.89 09/05/2021 1244   CALCIUM 9.0 09/05/2021 1244   GFRNONAA >60 09/05/2021 1244   CrCl cannot be calculated (Patient's most recent lab result is older than the maximum 21 days allowed.).  COAG No results found for: "INR", "PROTIME"  Radiology DG Knee Left Port  Result Date: 12/22/2022 CLINICAL DATA:  Status post total left knee arthroplasty. EXAM: PORTABLE LEFT KNEE - 1-2 VIEW COMPARISON:  MRI left knee 04/25/2022 FINDINGS: Interval total left knee arthroplasty. No perihardware lucency is seen to indicate hardware failure or loosening. Superior approach surgical drain overlies the suprapatellar joint space. Anterior surgical skin staples. Postoperative anterior subcutaneous air. Old screw tracts within the distal femur and proximal tibia. No acute fracture or dislocation. Small joint effusion. IMPRESSION: Interval total left knee arthroplasty without evidence of  hardware failure. Electronically Signed   By: Neita Garnet M.D.   On: 12/22/2022 14:23   PERIPHERAL VASCULAR CATHETERIZATION  Result Date: 12/16/2022 See surgical note for result.    Assessment/Plan 1. Chronic pulmonary embolism, unspecified pulmonary embolism type, unspecified whether acute cor pulmonale present (HCC) (Primary) The patient has done well status post joint replacement surgery.  Therefore, I recommend that we remove the IVC filter.  Risk and benefits were reviewed all questions answered patient has agreed to proceed.  Patient will continue to elevate to minimize swelling.  Given the history  of DVT and the possibility of post phlebitic changes such as swelling and discomfort the patient should wear graduated compression stockings.  The compression should be worn on a daily basis.  In addition, behavioral modification including elevation during the day and avoidance of prolonged dependency is helpful.    The patient will follow-up with me after the filter removal.  2. Primary osteoarthritis involving multiple joints Continue medications to treat the patient's degenerative disease as already ordered, these medications have been reviewed and there are no changes at this time.  Continued activity and therapy was stressed.  3. Benign hypertension Continue antihypertensive medications as already ordered, these medications have been reviewed and there are no changes at this time.  4. Atrial fibrillation with RVR (HCC) Continue antiarrhythmia medications as already ordered, these medications have been reviewed and there are no changes at this time.  Continue anticoagulation as ordered by Cardiology Service  5. Chronic obstructive pulmonary disease, unspecified COPD type (HCC) Continue pulmonary medications and aerosols as already ordered, these medications have been reviewed and there are no changes at this time.     Levora Dredge, MD  01/14/2023 1:53 PM

## 2023-01-15 ENCOUNTER — Ambulatory Visit (INDEPENDENT_AMBULATORY_CARE_PROVIDER_SITE_OTHER): Payer: Medicare HMO

## 2023-01-15 ENCOUNTER — Encounter (INDEPENDENT_AMBULATORY_CARE_PROVIDER_SITE_OTHER): Payer: Self-pay | Admitting: Vascular Surgery

## 2023-01-15 ENCOUNTER — Ambulatory Visit (INDEPENDENT_AMBULATORY_CARE_PROVIDER_SITE_OTHER): Payer: Medicare HMO | Admitting: Vascular Surgery

## 2023-01-15 VITALS — BP 168/78 | HR 73 | Resp 18 | Ht 64.0 in | Wt 180.6 lb

## 2023-01-15 DIAGNOSIS — M150 Primary generalized (osteo)arthritis: Secondary | ICD-10-CM

## 2023-01-15 DIAGNOSIS — Z86718 Personal history of other venous thrombosis and embolism: Secondary | ICD-10-CM

## 2023-01-15 DIAGNOSIS — J449 Chronic obstructive pulmonary disease, unspecified: Secondary | ICD-10-CM

## 2023-01-15 DIAGNOSIS — Z95828 Presence of other vascular implants and grafts: Secondary | ICD-10-CM

## 2023-01-15 DIAGNOSIS — I2782 Chronic pulmonary embolism: Secondary | ICD-10-CM | POA: Diagnosis not present

## 2023-01-15 DIAGNOSIS — I10 Essential (primary) hypertension: Secondary | ICD-10-CM

## 2023-01-15 DIAGNOSIS — E1129 Type 2 diabetes mellitus with other diabetic kidney complication: Secondary | ICD-10-CM | POA: Diagnosis not present

## 2023-01-15 DIAGNOSIS — I4891 Unspecified atrial fibrillation: Secondary | ICD-10-CM

## 2023-01-15 DIAGNOSIS — Z794 Long term (current) use of insulin: Secondary | ICD-10-CM | POA: Diagnosis not present

## 2023-01-15 DIAGNOSIS — N1831 Chronic kidney disease, stage 3a: Secondary | ICD-10-CM | POA: Diagnosis not present

## 2023-01-15 DIAGNOSIS — E1159 Type 2 diabetes mellitus with other circulatory complications: Secondary | ICD-10-CM | POA: Diagnosis not present

## 2023-01-15 DIAGNOSIS — E1122 Type 2 diabetes mellitus with diabetic chronic kidney disease: Secondary | ICD-10-CM | POA: Diagnosis not present

## 2023-01-15 DIAGNOSIS — R809 Proteinuria, unspecified: Secondary | ICD-10-CM | POA: Diagnosis not present

## 2023-01-16 ENCOUNTER — Encounter (INDEPENDENT_AMBULATORY_CARE_PROVIDER_SITE_OTHER): Payer: Self-pay | Admitting: Vascular Surgery

## 2023-01-16 DIAGNOSIS — R531 Weakness: Secondary | ICD-10-CM | POA: Diagnosis not present

## 2023-01-16 DIAGNOSIS — Z9889 Other specified postprocedural states: Secondary | ICD-10-CM | POA: Diagnosis not present

## 2023-01-16 DIAGNOSIS — G8929 Other chronic pain: Secondary | ICD-10-CM | POA: Diagnosis not present

## 2023-01-16 DIAGNOSIS — M25562 Pain in left knee: Secondary | ICD-10-CM | POA: Diagnosis not present

## 2023-01-16 DIAGNOSIS — M25662 Stiffness of left knee, not elsewhere classified: Secondary | ICD-10-CM | POA: Diagnosis not present

## 2023-01-20 DIAGNOSIS — M25562 Pain in left knee: Secondary | ICD-10-CM | POA: Diagnosis not present

## 2023-01-20 DIAGNOSIS — G8929 Other chronic pain: Secondary | ICD-10-CM | POA: Diagnosis not present

## 2023-01-21 DIAGNOSIS — E118 Type 2 diabetes mellitus with unspecified complications: Secondary | ICD-10-CM | POA: Diagnosis not present

## 2023-01-21 DIAGNOSIS — E7800 Pure hypercholesterolemia, unspecified: Secondary | ICD-10-CM | POA: Diagnosis not present

## 2023-01-21 DIAGNOSIS — D649 Anemia, unspecified: Secondary | ICD-10-CM | POA: Diagnosis not present

## 2023-01-21 DIAGNOSIS — I10 Essential (primary) hypertension: Secondary | ICD-10-CM | POA: Diagnosis not present

## 2023-01-22 DIAGNOSIS — M25562 Pain in left knee: Secondary | ICD-10-CM | POA: Diagnosis not present

## 2023-01-22 DIAGNOSIS — G8929 Other chronic pain: Secondary | ICD-10-CM | POA: Diagnosis not present

## 2023-01-26 DIAGNOSIS — R531 Weakness: Secondary | ICD-10-CM | POA: Diagnosis not present

## 2023-01-26 DIAGNOSIS — M25662 Stiffness of left knee, not elsewhere classified: Secondary | ICD-10-CM | POA: Diagnosis not present

## 2023-01-26 DIAGNOSIS — G8929 Other chronic pain: Secondary | ICD-10-CM | POA: Diagnosis not present

## 2023-01-26 DIAGNOSIS — Z9889 Other specified postprocedural states: Secondary | ICD-10-CM | POA: Diagnosis not present

## 2023-01-26 DIAGNOSIS — M25562 Pain in left knee: Secondary | ICD-10-CM | POA: Diagnosis not present

## 2023-01-27 DIAGNOSIS — D649 Anemia, unspecified: Secondary | ICD-10-CM | POA: Diagnosis not present

## 2023-01-30 DIAGNOSIS — R531 Weakness: Secondary | ICD-10-CM | POA: Diagnosis not present

## 2023-01-30 DIAGNOSIS — M25562 Pain in left knee: Secondary | ICD-10-CM | POA: Diagnosis not present

## 2023-01-30 DIAGNOSIS — G8929 Other chronic pain: Secondary | ICD-10-CM | POA: Diagnosis not present

## 2023-01-30 DIAGNOSIS — M25662 Stiffness of left knee, not elsewhere classified: Secondary | ICD-10-CM | POA: Diagnosis not present

## 2023-01-30 DIAGNOSIS — Z9889 Other specified postprocedural states: Secondary | ICD-10-CM | POA: Diagnosis not present

## 2023-02-03 DIAGNOSIS — M25562 Pain in left knee: Secondary | ICD-10-CM | POA: Diagnosis not present

## 2023-02-03 DIAGNOSIS — G8929 Other chronic pain: Secondary | ICD-10-CM | POA: Diagnosis not present

## 2023-02-03 DIAGNOSIS — R531 Weakness: Secondary | ICD-10-CM | POA: Diagnosis not present

## 2023-02-03 DIAGNOSIS — M25662 Stiffness of left knee, not elsewhere classified: Secondary | ICD-10-CM | POA: Diagnosis not present

## 2023-02-03 DIAGNOSIS — Z9889 Other specified postprocedural states: Secondary | ICD-10-CM | POA: Diagnosis not present

## 2023-02-05 DIAGNOSIS — Z96652 Presence of left artificial knee joint: Secondary | ICD-10-CM | POA: Diagnosis not present

## 2023-02-06 DIAGNOSIS — Z794 Long term (current) use of insulin: Secondary | ICD-10-CM | POA: Diagnosis not present

## 2023-02-06 DIAGNOSIS — E1122 Type 2 diabetes mellitus with diabetic chronic kidney disease: Secondary | ICD-10-CM | POA: Diagnosis not present

## 2023-02-06 DIAGNOSIS — N1831 Chronic kidney disease, stage 3a: Secondary | ICD-10-CM | POA: Diagnosis not present

## 2023-02-16 ENCOUNTER — Telehealth (INDEPENDENT_AMBULATORY_CARE_PROVIDER_SITE_OTHER): Payer: Self-pay

## 2023-02-16 NOTE — Telephone Encounter (Signed)
 Spoke with the patient and she is scheduled with Dr. Gilda Crease for a IVC filter removal on 02/24/23 with a 1:00 pm arrival time to the [facility]. Pre-procedure instructions were discussed and will be sent to Mychart and mailed.

## 2023-02-18 ENCOUNTER — Ambulatory Visit: Payer: Medicare HMO | Admitting: Dermatology

## 2023-02-24 ENCOUNTER — Other Ambulatory Visit: Payer: Self-pay

## 2023-02-24 ENCOUNTER — Encounter
Admission: RE | Disposition: A | Discharge status: Home / Self Care | Payer: Self-pay | Source: Home / Self Care | Attending: Vascular Surgery

## 2023-02-24 ENCOUNTER — Ambulatory Visit
Admission: RE | Admit: 2023-02-24 | Discharge: 2023-02-24 | Disposition: A | Discharge status: Home / Self Care | Payer: Medicare HMO | Attending: Vascular Surgery | Admitting: Vascular Surgery

## 2023-02-24 ENCOUNTER — Encounter: Payer: Self-pay | Admitting: Vascular Surgery

## 2023-02-24 DIAGNOSIS — I10 Essential (primary) hypertension: Secondary | ICD-10-CM | POA: Insufficient documentation

## 2023-02-24 DIAGNOSIS — Z96652 Presence of left artificial knee joint: Secondary | ICD-10-CM | POA: Diagnosis not present

## 2023-02-24 DIAGNOSIS — I4891 Unspecified atrial fibrillation: Secondary | ICD-10-CM | POA: Diagnosis not present

## 2023-02-24 DIAGNOSIS — Z7901 Long term (current) use of anticoagulants: Secondary | ICD-10-CM | POA: Insufficient documentation

## 2023-02-24 DIAGNOSIS — J449 Chronic obstructive pulmonary disease, unspecified: Secondary | ICD-10-CM | POA: Insufficient documentation

## 2023-02-24 DIAGNOSIS — Z86711 Personal history of pulmonary embolism: Secondary | ICD-10-CM

## 2023-02-24 DIAGNOSIS — M159 Polyosteoarthritis, unspecified: Secondary | ICD-10-CM | POA: Diagnosis not present

## 2023-02-24 DIAGNOSIS — Z86718 Personal history of other venous thrombosis and embolism: Secondary | ICD-10-CM | POA: Diagnosis not present

## 2023-02-24 DIAGNOSIS — I82409 Acute embolism and thrombosis of unspecified deep veins of unspecified lower extremity: Secondary | ICD-10-CM

## 2023-02-24 DIAGNOSIS — Z9889 Other specified postprocedural states: Secondary | ICD-10-CM | POA: Diagnosis not present

## 2023-02-24 DIAGNOSIS — Z4589 Encounter for adjustment and management of other implanted devices: Secondary | ICD-10-CM | POA: Insufficient documentation

## 2023-02-24 DIAGNOSIS — Z9660 Presence of unspecified orthopedic joint implant: Secondary | ICD-10-CM

## 2023-02-24 HISTORY — PX: IVC FILTER REMOVAL: CATH118246

## 2023-02-24 LAB — GLUCOSE, CAPILLARY
Glucose-Capillary: 103 mg/dL — ABNORMAL HIGH (ref 70–99)
Glucose-Capillary: 128 mg/dL — ABNORMAL HIGH (ref 70–99)

## 2023-02-24 SURGERY — IVC FILTER REMOVAL
Anesthesia: Moderate Sedation

## 2023-02-24 MED ORDER — CEFAZOLIN SODIUM-DEXTROSE 2-4 GM/100ML-% IV SOLN
2.0000 g | INTRAVENOUS | Status: AC
  Administered 2023-02-24: 2 g via INTRAVENOUS

## 2023-02-24 MED ORDER — FAMOTIDINE 20 MG PO TABS: 40.0000 mg | ORAL_TABLET | Freq: Once | ORAL | Status: AC | PRN

## 2023-02-24 MED ORDER — FENTANYL CITRATE (PF) 100 MCG/2ML IJ SOLN
INTRAMUSCULAR | Status: DC | PRN
  Administered 2023-02-24: 50 ug via INTRAVENOUS
  Administered 2023-02-24: 25 ug via INTRAVENOUS

## 2023-02-24 MED ORDER — HEPARIN (PORCINE) IN NACL 1000-0.9 UT/500ML-% IV SOLN
INTRAVENOUS | Status: DC | PRN
  Administered 2023-02-24: 500 mL

## 2023-02-24 MED ORDER — MIDAZOLAM HCL 2 MG/2ML IJ SOLN
INTRAMUSCULAR | Status: DC | PRN
  Administered 2023-02-24: 1 mg via INTRAVENOUS
  Administered 2023-02-24: .5 mg via INTRAVENOUS

## 2023-02-24 MED ORDER — METHYLPREDNISOLONE SODIUM SUCC 125 MG IJ SOLR: 125.0000 mg | Freq: Once | INTRAMUSCULAR | Status: AC | PRN

## 2023-02-24 MED ORDER — LIDOCAINE HCL (PF) 1 % IJ SOLN
INTRAMUSCULAR | Status: DC | PRN
  Administered 2023-02-24: 10 mL

## 2023-02-24 MED ORDER — CEFAZOLIN SODIUM-DEXTROSE 2-4 GM/100ML-% IV SOLN
INTRAVENOUS | Status: AC
  Filled 2023-02-24: qty 100

## 2023-02-24 MED ORDER — IODIXANOL 320 MG/ML IV SOLN
INTRAVENOUS | Status: DC | PRN
  Administered 2023-02-24: 15 mL

## 2023-02-24 MED ORDER — HYDROMORPHONE HCL 1 MG/ML IJ SOLN: 1.0000 mg | Freq: Once | INTRAMUSCULAR | Status: DC | PRN

## 2023-02-24 MED ORDER — FAMOTIDINE 20 MG PO TABS
ORAL_TABLET | ORAL | Status: AC
  Administered 2023-02-24: 40 mg via ORAL
  Filled 2023-02-24: qty 2

## 2023-02-24 MED ORDER — MIDAZOLAM HCL 2 MG/ML PO SYRP: 8.0000 mg | ORAL_SOLUTION | Freq: Once | ORAL | Status: DC | PRN

## 2023-02-24 MED ORDER — DIPHENHYDRAMINE HCL 50 MG/ML IJ SOLN
INTRAMUSCULAR | Status: AC
  Administered 2023-02-24: 50 mg via INTRAVENOUS
  Filled 2023-02-24: qty 1

## 2023-02-24 MED ORDER — FENTANYL CITRATE (PF) 100 MCG/2ML IJ SOLN
INTRAMUSCULAR | Status: AC
  Filled 2023-02-24: qty 2

## 2023-02-24 MED ORDER — SODIUM CHLORIDE 0.9 % IV SOLN: INTRAVENOUS | Status: DC

## 2023-02-24 MED ORDER — ONDANSETRON HCL 4 MG/2ML IJ SOLN: 4.0000 mg | Freq: Four times a day (QID) | INTRAMUSCULAR | Status: DC | PRN

## 2023-02-24 MED ORDER — METHYLPREDNISOLONE SODIUM SUCC 125 MG IJ SOLR
INTRAMUSCULAR | Status: AC
  Administered 2023-02-24: 125 mg via INTRAVENOUS
  Filled 2023-02-24: qty 2

## 2023-02-24 MED ORDER — DIPHENHYDRAMINE HCL 50 MG/ML IJ SOLN: 50.0000 mg | Freq: Once | INTRAMUSCULAR | Status: AC | PRN

## 2023-02-24 MED ORDER — MIDAZOLAM HCL 2 MG/2ML IJ SOLN
INTRAMUSCULAR | Status: AC
  Filled 2023-02-24: qty 2

## 2023-02-24 SURGICAL SUPPLY — 7 items
COVER PROBE ULTRASOUND 5X96 (MISCELLANEOUS) IMPLANT
KIT SNARE RETRIEVAL 3-LOOP (MISCELLANEOUS) IMPLANT
NDL ENTRY 21GA 7CM ECHOTIP (NEEDLE) IMPLANT
NEEDLE ENTRY 21GA 7CM ECHOTIP (NEEDLE) ×1
PACK ANGIOGRAPHY (CUSTOM PROCEDURE TRAY) ×1 IMPLANT
SET INTRO CAPELLA COAXIAL (SET/KITS/TRAYS/PACK) IMPLANT
WIRE GUIDERIGHT .035X150 (WIRE) IMPLANT

## 2023-02-24 NOTE — Op Note (Signed)
  OPERATIVE NOTE   PRE-OPERATIVE DIAGNOSIS: History of DVT with PE s/p TJR  POST-OPERATIVE DIAGNOSIS: Same  PROCEDURE: Retrieval of IVC Filter Inferior Vena Cavagram  SURGEON: Renford Dills, M.D.  ANESTHESIA:  Conscious sedation was administered under my direct supervision by the interventional radiology RN. IV Versed plus fentanyl were utilized. Continuous ECG, pulse oximetry and blood pressure was monitored throughout the entire procedure. Conscious sedation was for a total of 24 minutes.  ESTIMATED BLOOD LOSS: Minimal cc  FINDING(S):inferior vena cava is widely patent filter is in place in good position. Filter is removed without incident  SPECIMEN(S):  IVC filter intact  INDICATIONS:   Kayla Wu is a 78 y.o. female who presents with DVT and PE. The patient has now tolerated anticoagulation for several months. Therefore, the IVC filter is recommended to be removed. The risks and benefits were reviewed with the patient all questions were answered and they agreed to proceed with IVC filter retrieval. Oral anticoagulation will be continued.  DESCRIPTION: After obtaining full informed written consent, the patient was brought back to the Special Procedure Suite and placed in the supine position.  The patient received IV antibiotics prior to induction.  After obtaining adequate sedation, the patient was prepped and draped in the standard fashion and appropriate time out is called.   Fluoroscopy time is 1.0 minutes  Contrast used is 15 cc   Ultrasound was placed in a sterile sleeve.The right neck was then imaged with ultrasound.   Jugular vein was identified it is echolucent and homogeneous indicating patency. 1% lidocaine is then infiltrated under ultrasound visualization and subsequently a Seldinger needle is inserted under real-time ultrasound guidance.  J-wire is then advanced into the inferior vena cava under fluoroscopic guidance. With the tip of the sheath at the  confluence of the iliac veins inferior vena caval imaging is performed.  After review of the image the sheath is repositioned to above the filter and the snares introduced. Snares opened and the hook is secured without difficulty. The filter is then collapsed within the sheath and removed without difficulty.  Sheath is removed by pressures held the patient tolerated the procedure well and there were no immediate complications.  Interpretation: inferior vena cava is widely patent filter is in place in good position. Filter is removed without incident.     COMPLICATIONS: None  CONDITION: Kayla Wu, M.D. [facility] Office: [phone]   02/24/2023, 3:01 PM

## 2023-02-24 NOTE — Interval H&P Note (Signed)
History and Physical Interval Note:  02/24/2023 2:14 PM  Kayla Wu  has presented today for surgery, with the diagnosis of IVC filter removal   DVT.  The various methods of treatment have been discussed with the patient and family. After consideration of risks, benefits and other options for treatment, the patient has consented to  Procedure(s): IVC FILTER REMOVAL (N/A) as a surgical intervention.  The patient's history has been reviewed, patient examined, no change in status, stable for surgery.  I have reviewed the patient's chart and labs.  Questions were answered to the patient's satisfaction.     Levora Dredge

## 2023-02-24 NOTE — Progress Notes (Signed)
MRN : [id number]  Kayla Wu is a 78 y.o. (1945/03/14) female who presents with chief complaint of legs hurt and swell.  History of Present Illness:   Patient presents to [facility] for removal of her IVC filter.  The patient is status post IVC filter placement prior to joint replacement surgery.     The patient is status post successful total left knee replacement on December 22, 2022.   The patient has a history of DVT and therefore was at high risk for recurrence in the perioperative timeframe after undergoing a total joint replacement.     The patient notes the affected leg is not increasingly swollen or painful.  No signs and symptoms consistent with recurrence of DVT in the lower extremity.  Patient does note that as a baseline the affected extremity tends to swell with prolonged dependence, symptoms are much better with elevation.  The patient notes minimal edema in the morning which steadily worsens throughout the day.   The patient has been doing well with rehab and their mobility is increasing.   The patient has not been using compression therapy at this point.   The patient is not currently on anticoagulation.   No SOB or pleuritic chest pains.  No cough or hemoptysis.   No blood per rectum or blood in any sputum.  No excessive bruising per the patient.    No recent shortening of the patient's walking distance or new symptoms consistent with claudication.  No history of rest pain symptoms. No new ulcers or wounds of the lower extremities have occurred.   The patient denies amaurosis fugax or recent TIA symptoms. There are no recent neurological changes noted. No recent episodes of angina or shortness of breath documented.    Duplex ultrasound of the venous system bilateral lower extremities done today is negative for DVT.    No outpatient medications have been marked as taking for the 02/24/23 encounter [facility] Encounter).    Past Medical History:   Diagnosis Date   Anxiety    a.) on BZO PRN (lorazepam)   Aortic atherosclerosis (HCC)    CAD (coronary artery disease)    a.) MV 05/23/2014: EF 75%, no ischemia/artifact; b.) cCTA 09/16/2021: Ca2+ 340 (79th %ile) in pLAD and pLCx distributions   Chronic hypoxemic respiratory failure (HCC)    Chronic urticaria    COPD (chronic obstructive pulmonary disease) (HCC)    DDD (degenerative disc disease), lumbar    Depression    Diastolic dysfunction    a.) TTE 05/23/2014: EF >55%, no RWMAs, G1DD, triv AR, mild MR, RVSP 37.1; b.) TTE 01/19/2018: EF >55%, mild LVH, no RWMAs, G1DD, mild LAE, triv AR/MR/PR, mild TR, RVSP 36.1; c.) TTE 06/29/2020: EF 60-65%, no RWMAs, G1DD, RVSP 14.9   GERD (gastroesophageal reflux disease)    Hepatic steatosis    History of 2019 novel coronavirus disease (COVID-19) 06/25/2020   HLD (hyperlipidemia)    Hypertension    Insomnia    a.) on hypnotic (zolpidem)   Menopausal state    Morbid obesity (HCC)    Non-cardiac chest pain    On rivaroxaban therapy    Osteoarthritis    Ovarian cancer (HCC) 2012   a.) s/p Tx with systemic chemotherapy   Oxygen desaturation during sleep    a.) on 1-2 L/Ros[place] supplemental oxygen while sleeping   PAF (paroxysmal atrial fibrillation) (HCC) 06/2020   a.) Dx'd in setting of SARS-CoV-2 infection; b.) CHA2DS2-VASc = 8 (age x2,  sex, HTN, VTE x2, vascular disease history, T2DM) as of 12/19/2022; c.) cardiac rate/rhythm maintained on oral diltiazem; chronically anticoagulated using rivaroxaban   Pneumonia 1987   Rectovaginal fistula    Sleep apnea    a.) does not utilize nocturnal PAP therapy   T2DM (type 2 diabetes mellitus) (HCC)    VTE (venous thromboembolism) 06/2020   a.) developed in setting of SARS-CoV-2 infection and A.fib with RVR    Past Surgical History:  Procedure Laterality Date   ABDOMINAL HYSTERECTOMY     APPENDECTOMY     CHOLECYSTECTOMY     ESOPHAGOGASTRODUODENOSCOPY (EGD) WITH PROPOFOL N/A 06/28/2021    Procedure: ESOPHAGOGASTRODUODENOSCOPY (EGD) WITH PROPOFOL;  Surgeon: Regis Bill, MD;  Location: [facility];  Service: Endoscopy;  Laterality: N/A;  DM, ON WARFARIN   FINGER ARTHROPLASTY Left 12/22/2014   Procedure: FINGER ARTHROPLASTY;  Surgeon: Myra Rude, MD;  Location: [facility];  Service: Orthopedics;  Laterality: Left;   IVC FILTER INSERTION N/A 12/16/2022   Procedure: IVC FILTER INSERTION;  Surgeon: Renford Dills, MD;  Location: [facility];  Service: Cardiovascular;  Laterality: N/A;   KNEE ARTHROPLASTY Left 12/22/2022   Procedure: COMPUTER ASSISTED TOTAL KNEE ARTHROPLASTY;  Surgeon: Donato Heinz, MD;  Location: [facility] ORS;  Service: Orthopedics;  Laterality: Left;   KNEE ARTHROSCOPY WITH SUBCHONDROPLASTY Left 06/27/2022   Procedure: Left knee arthroscopic chondroplasty, possible partial meniscectomy, and subchondroplasty to the medial femoral condyle;  Surgeon: Signa Kell, MD;  Location: [facility];  Service: Orthopedics;  Laterality: Left;  Diabetic    Social History Social History   Tobacco Use   Smoking status: Former    Current packs/day: 0.00    Average packs/day: 2.0 packs/day for 42.0 years (84.0 ttl pk-yrs)    Types: Cigarettes    Start date: 07/04/1965    Quit date: 07/05/2007    Years since quitting: 15.6   Smokeless tobacco: Never  Vaping Use   Vaping status: Never Used  Substance Use Topics   Alcohol use: No    Comment: occasional glass of wine once a month   Drug use: No    Family History Family History  Problem Relation Age of Onset   Breast cancer Mother 53   Emphysema Father        smoked   Colon cancer Sister    Uterine cancer Sister    Throat cancer Brother     Allergies  Allergen Reactions   Iodinated Contrast Media Hives, Shortness Of Breath and Itching    Patient states she had itching, hives, and SOB after injection.    Iohexol Itching    Breakthrough contrast reaction.  Patient took premedication  (13 hr prep) prior to contrasted scan for prior episode itching /nausea/ headache on 01/25/2019 with contrast. Patient experienced breakthrough severe itching and redness of the skin after contrast admin after having taken prep. See Contrast reaction note 04/29/2019 for further details.     REVIEW OF SYSTEMS (Negative unless checked)  Constitutional: [] Weight loss  [] Fever  [] Chills Cardiac: [] Chest pain   [] Chest pressure   [] Palpitations   [] Shortness of breath when laying flat   [] Shortness of breath with exertion. Vascular:  [] Pain in legs with walking   [x] Pain in legs at rest  [] History of DVT   [] Phlebitis   [x] Swelling in legs   [] Varicose veins   [] Non-healing ulcers Pulmonary:   [] Uses home oxygen   [] Productive cough   [] Hemoptysis   [] Wheeze  [] COPD   [] Asthma Neurologic:  []   Dizziness   [] Seizures   [] History of stroke   [] History of TIA  [] Aphasia   [] Vissual changes   [] Weakness or numbness in arm   [] Weakness or numbness in leg Musculoskeletal:   [] Joint swelling   [] Joint pain   [] Low back pain Hematologic:  [] Easy bruising  [] Easy bleeding   [] Hypercoagulable state   [] Anemic Gastrointestinal:  [] Diarrhea   [] Vomiting  [] Gastroesophageal reflux/heartburn   [] Difficulty swallowing. Genitourinary:  [] Chronic kidney disease   [] Difficult urination  [] Frequent urination   [] Blood in urine Skin:  [] Rashes   [] Ulcers  Psychological:  [] History of anxiety   []  History of major depression.  Physical Examination  There were no vitals filed for this visit. There is no height or weight on file to calculate BMI. Gen: WD/WN, NAD Head: [place]/AT, No temporalis wasting.  Ear/Nose/Throat: Hearing grossly intact, nares w/o erythema or drainage, pinna without lesions Eyes: PER, EOMI, sclera nonicteric.  Neck: Supple, no gross masses.  No JVD.  Pulmonary:  Good air movement, no audible wheezing, no use of accessory muscles.  Cardiac: RRR, precordium not hyperdynamic. Vascular:  scattered  varicosities present bilaterally.  Moderate venous stasis changes to the legs bilaterally.  1-2+ soft pitting edema. CEAP C4sEpAsPr   Vessel Right Left  Radial Palpable Palpable  Gastrointestinal: soft, non-distended. No guarding/no peritoneal signs.  Musculoskeletal: M/S 5/5 throughout.  No deformity.  Neurologic: CN 2-12 intact. Pain and light touch intact in extremities.  Symmetrical.  Speech is fluent. Motor exam as listed above. Psychiatric: Judgment intact, Mood & affect appropriate for pt's clinical situation. Dermatologic: Venous rashes no ulcers noted.  No changes consistent with cellulitis. Lymph : No lichenification or skin changes of chronic lymphedema.  CBC Lab Results  Component Value Date   WBC 12.0 (H) 10/09/2020   HGB 11.9 (L) 10/09/2020   HCT 35.8 (L) 10/09/2020   MCV 95.2 10/09/2020   PLT 440 (H) 10/09/2020    BMET    Component Value Date/Time   NA 137 09/05/2021 1244   K 4.4 09/05/2021 1244   CL 107 09/05/2021 1244   CO2 21 (L) 09/05/2021 1244   GLUCOSE 97 09/05/2021 1244   BUN 18 09/05/2021 1244   CREATININE 0.89 09/05/2021 1244   CALCIUM 9.0 09/05/2021 1244   GFRNONAA >60 09/05/2021 1244   CrCl cannot be calculated (Patient's most recent lab result is older than the maximum 21 days allowed.).  COAG No results found for: "INR", "PROTIME"  Radiology No results found.   Assessment/Plan . Chronic pulmonary embolism, unspecified pulmonary embolism type, unspecified whether acute cor pulmonale present (HCC) (Primary) The patient has done well status post joint replacement surgery.  Therefore, I recommend that we remove the IVC filter.   Risk and benefits were reviewed all questions answered patient has agreed to proceed.   Patient will continue to elevate to minimize swelling.   Given the history of DVT and the possibility of post phlebitic changes such as swelling and discomfort the patient should wear graduated compression stockings.  The  compression should be worn on a daily basis.  In addition, behavioral modification including elevation during the day and avoidance of prolonged dependency is helpful.     The patient will follow-up with me after the filter removal.   2. Primary osteoarthritis involving multiple joints Continue medications to treat the patient's degenerative disease as already ordered, these medications have been reviewed and there are no changes at this time.   Continued activity and therapy was stressed.  3. Benign hypertension Continue antihypertensive medications as already ordered, these medications have been reviewed and there are no changes at this time.   4. Atrial fibrillation with RVR (HCC) Continue antiarrhythmia medications as already ordered, these medications have been reviewed and there are no changes at this time.   Continue anticoagulation as ordered by Cardiology Service   5. Chronic obstructive pulmonary disease, unspecified COPD type (HCC) Continue pulmonary medications and aerosols as already ordered, these medications have been reviewed and there are no changes at this time.   Levora Dredge, MD  02/24/2023 8:20 AM

## 2023-02-24 NOTE — H&P (View-Only) (Signed)
MRN : [id number]  Kayla Wu is a 78 y.o. (1945/03/14) female who presents with chief complaint of legs hurt and swell.  History of Present Illness:   Patient presents to [facility] for removal of her IVC filter.  The patient is status post IVC filter placement prior to joint replacement surgery.     The patient is status post successful total left knee replacement on December 22, 2022.   The patient has a history of DVT and therefore was at high risk for recurrence in the perioperative timeframe after undergoing a total joint replacement.     The patient notes the affected leg is not increasingly swollen or painful.  No signs and symptoms consistent with recurrence of DVT in the lower extremity.  Patient does note that as a baseline the affected extremity tends to swell with prolonged dependence, symptoms are much better with elevation.  The patient notes minimal edema in the morning which steadily worsens throughout the day.   The patient has been doing well with rehab and their mobility is increasing.   The patient has not been using compression therapy at this point.   The patient is not currently on anticoagulation.   No SOB or pleuritic chest pains.  No cough or hemoptysis.   No blood per rectum or blood in any sputum.  No excessive bruising per the patient.    No recent shortening of the patient's walking distance or new symptoms consistent with claudication.  No history of rest pain symptoms. No new ulcers or wounds of the lower extremities have occurred.   The patient denies amaurosis fugax or recent TIA symptoms. There are no recent neurological changes noted. No recent episodes of angina or shortness of breath documented.    Duplex ultrasound of the venous system bilateral lower extremities done today is negative for DVT.    No outpatient medications have been marked as taking for the 02/24/23 encounter [facility]counter).    Past Medical History:   Diagnosis Date   Anxiety    a.) on BZO PRN (lorazepam)   Aortic atherosclerosis (HCC)    CAD (coronary artery disease)    a.) MV 05/23/2014: EF 75%, no ischemia/artifact; b.) cCTA 09/16/2021: Ca2+ 340 (79th %ile) in pLAD and pLCx distributions   Chronic hypoxemic respiratory failure (HCC)    Chronic urticaria    COPD (chronic obstructive pulmonary disease) (HCC)    DDD (degenerative disc disease), lumbar    Depression    Diastolic dysfunction    a.) TTE 05/23/2014: EF >55%, no RWMAs, G1DD, triv AR, mild MR, RVSP 37.1; b.) TTE 01/19/2018: EF >55%, mild LVH, no RWMAs, G1DD, mild LAE, triv AR/MR/PR, mild TR, RVSP 36.1; c.) TTE 06/29/2020: EF 60-65%, no RWMAs, G1DD, RVSP 14.9   GERD (gastroesophageal reflux disease)    Hepatic steatosis    History of 2019 novel coronavirus disease (COVID-19) 06/25/2020   HLD (hyperlipidemia)    Hypertension    Insomnia    a.) on hypnotic (zolpidem)   Menopausal state    Morbid obesity (HCC)    Non-cardiac chest pain    On rivaroxaban therapy    Osteoarthritis    Ovarian cancer (HCC) 2012   a.) s/p Tx with systemic chemotherapy   Oxygen desaturation during sleep    a.) on 1-2 L/[place] supplemental oxygen while sleeping   PAF (paroxysmal atrial fibrillation) (HCC) 06/2020   a.) Dx'd in setting of SARS-CoV-2 infection; b.) CHA2DS2-VASc = 8 (age x2,  sex, HTN, VTE x2, vascular disease history, T2DM) as of 12/19/2022; c.) cardiac rate/rhythm maintained on oral diltiazem; chronically anticoagulated using rivaroxaban   Pneumonia 1987   Rectovaginal fistula    Sleep apnea    a.) does not utilize nocturnal PAP therapy   T2DM (type 2 diabetes mellitus) (HCC)    VTE (venous thromboembolism) 06/2020   a.) developed in setting of SARS-CoV-2 infection and A.fib with RVR    Past Surgical History:  Procedure Laterality Date   ABDOMINAL HYSTERECTOMY     APPENDECTOMY     CHOLECYSTECTOMY     ESOPHAGOGASTRODUODENOSCOPY (EGD) WITH PROPOFOL N/A 06/28/2021    Procedure: ESOPHAGOGASTRODUODENOSCOPY (EGD) WITH PROPOFOL;  Surgeon: Regis Bill, MD;  Location: [facility];  Service: Endoscopy;  Laterality: N/A;  DM, ON WARFARIN   FINGER ARTHROPLASTY Left 12/22/2014   Procedure: FINGER ARTHROPLASTY;  Surgeon: Myra Rude, MD;  Location: [facility];  Service: Orthopedics;  Laterality: Left;   IVC FILTER INSERTION N/A 12/16/2022   Procedure: IVC FILTER INSERTION;  Surgeon: Renford Dills, MD;  Location: [facility];  Service: Cardiovascular;  Laterality: N/A;   KNEE ARTHROPLASTY Left 12/22/2022   Procedure: COMPUTER ASSISTED TOTAL KNEE ARTHROPLASTY;  Surgeon: Donato Heinz, MD;  Location: [facility];  Service: Orthopedics;  Laterality: Left;   KNEE ARTHROSCOPY WITH SUBCHONDROPLASTY Left 06/27/2022   Procedure: Left knee arthroscopic chondroplasty, possible partial meniscectomy, and subchondroplasty to the medial femoral condyle;  Surgeon: Signa Kell, MD;  Location: [facility];  Service: Orthopedics;  Laterality: Left;  Diabetic    Social History Social History   Tobacco Use   Smoking status: Former    Current packs/day: 0.00    Average packs/day: 2.0 packs/day for 42.0 years (84.0 ttl pk-yrs)    Types: Cigarettes    Start date: 07/04/1965    Quit date: 07/05/2007    Years since quitting: 15.6   Smokeless tobacco: Never  Vaping Use   Vaping status: Never Used  Substance Use Topics   Alcohol use: No    Comment: occasional glass of wine once a month   Drug use: No    Family History Family History  Problem Relation Age of Onset   Breast cancer Mother 53   Emphysema Father        smoked   Colon cancer Sister    Uterine cancer Sister    Throat cancer Brother     Allergies  Allergen Reactions   Iodinated Contrast Media Hives, Shortness Of Breath and Itching    Patient states she had itching, hives, and SOB after injection.    Iohexol Itching    Breakthrough contrast reaction.  Patient took premedication  (13 hr prep) prior to contrasted scan for prior episode itching /nausea/ headache on 01/25/2019 with contrast. Patient experienced breakthrough severe itching and redness of the skin after contrast admin after having taken prep. See Contrast reaction note 04/29/2019 for further details.     REVIEW OF SYSTEMS (Negative unless checked)  Constitutional: [] Weight loss  [] Fever  [] Chills Cardiac: [] Chest pain   [] Chest pressure   [] Palpitations   [] Shortness of breath when laying flat   [] Shortness of breath with exertion. Vascular:  [] Pain in legs with walking   [x] Pain in legs at rest  [] History of DVT   [] Phlebitis   [x] Swelling in legs   [] Varicose veins   [] Non-healing ulcers Pulmonary:   [] Uses home oxygen   [] Productive cough   [] Hemoptysis   [] Wheeze  [] COPD   [] Asthma Neurologic:  []   Dizziness   [] Seizures   [] History of stroke   [] History of TIA  [] Aphasia   [] Vissual changes   [] Weakness or numbness in arm   [] Weakness or numbness in leg Musculoskeletal:   [] Joint swelling   [] Joint pain   [] Low back pain Hematologic:  [] Easy bruising  [] Easy bleeding   [] Hypercoagulable state   [] Anemic Gastrointestinal:  [] Diarrhea   [] Vomiting  [] Gastroesophageal reflux/heartburn   [] Difficulty swallowing. Genitourinary:  [] Chronic kidney disease   [] Difficult urination  [] Frequent urination   [] Blood in urine Skin:  [] Rashes   [] Ulcers  Psychological:  [] History of anxiety   []  History of major depression.  Physical Examination  There were no vitals filed for this visit. There is no height or weight on file to calculate BMI. Gen: WD/WN, NAD Head: [place]/AT, No temporalis wasting.  Ear/Nose/Throat: Hearing grossly intact, nares w/o erythema or drainage, pinna without lesions Eyes: PER, EOMI, sclera nonicteric.  Neck: Supple, no gross masses.  No JVD.  Pulmonary:  Good air movement, no audible wheezing, no use of accessory muscles.  Cardiac: RRR, precordium not hyperdynamic. Vascular:  scattered  varicosities present bilaterally.  Moderate venous stasis changes to the legs bilaterally.  1-2+ soft pitting edema. CEAP C4sEpAsPr   Vessel Right Left  Radial Palpable Palpable  Gastrointestinal: soft, non-distended. No guarding/no peritoneal signs.  Musculoskeletal: M/S 5/5 throughout.  No deformity.  Neurologic: CN 2-12 intact. Pain and light touch intact in extremities.  Symmetrical.  Speech is fluent. Motor exam as listed above. Psychiatric: Judgment intact, Mood & affect appropriate for pt's clinical situation. Dermatologic: Venous rashes no ulcers noted.  No changes consistent with cellulitis. Lymph : No lichenification or skin changes of chronic lymphedema.  CBC Lab Results  Component Value Date   WBC 12.0 (H) 10/09/2020   HGB 11.9 (L) 10/09/2020   HCT 35.8 (L) 10/09/2020   MCV 95.2 10/09/2020   PLT 440 (H) 10/09/2020    BMET    Component Value Date/Time   NA 137 09/05/2021 1244   K 4.4 09/05/2021 1244   CL 107 09/05/2021 1244   CO2 21 (L) 09/05/2021 1244   GLUCOSE 97 09/05/2021 1244   BUN 18 09/05/2021 1244   CREATININE 0.89 09/05/2021 1244   CALCIUM 9.0 09/05/2021 1244   GFRNONAA >60 09/05/2021 1244   CrCl cannot be calculated (Patient's most recent lab result is older than the maximum 21 days allowed.).  COAG No results found for: "INR", "PROTIME"  Radiology No results found.   Assessment/Plan . Chronic pulmonary embolism, unspecified pulmonary embolism type, unspecified whether acute cor pulmonale present (HCC) (Primary) The patient has done well status post joint replacement surgery.  Therefore, I recommend that we remove the IVC filter.   Risk and benefits were reviewed all questions answered patient has agreed to proceed.   Patient will continue to elevate to minimize swelling.   Given the history of DVT and the possibility of post phlebitic changes such as swelling and discomfort the patient should wear graduated compression stockings.  The  compression should be worn on a daily basis.  In addition, behavioral modification including elevation during the day and avoidance of prolonged dependency is helpful.     The patient will follow-up with me after the filter removal.   2. Primary osteoarthritis involving multiple joints Continue medications to treat the patient's degenerative disease as already ordered, these medications have been reviewed and there are no changes at this time.   Continued activity and therapy was stressed.  3. Benign hypertension Continue antihypertensive medications as already ordered, these medications have been reviewed and there are no changes at this time.   4. Atrial fibrillation with RVR (HCC) Continue antiarrhythmia medications as already ordered, these medications have been reviewed and there are no changes at this time.   Continue anticoagulation as ordered by Cardiology Service   5. Chronic obstructive pulmonary disease, unspecified COPD type (HCC) Continue pulmonary medications and aerosols as already ordered, these medications have been reviewed and there are no changes at this time.   Levora Dredge, MD  02/24/2023 8:20 AM

## 2023-02-25 ENCOUNTER — Encounter: Payer: Self-pay | Admitting: Vascular Surgery

## 2023-02-25 DIAGNOSIS — Z794 Long term (current) use of insulin: Secondary | ICD-10-CM | POA: Diagnosis not present

## 2023-02-25 DIAGNOSIS — Z1211 Encounter for screening for malignant neoplasm of colon: Secondary | ICD-10-CM | POA: Diagnosis not present

## 2023-02-25 DIAGNOSIS — I10 Essential (primary) hypertension: Secondary | ICD-10-CM | POA: Diagnosis not present

## 2023-02-25 DIAGNOSIS — E119 Type 2 diabetes mellitus without complications: Secondary | ICD-10-CM | POA: Diagnosis not present

## 2023-02-25 DIAGNOSIS — E785 Hyperlipidemia, unspecified: Secondary | ICD-10-CM | POA: Diagnosis not present

## 2023-02-25 DIAGNOSIS — Z0000 Encounter for general adult medical examination without abnormal findings: Secondary | ICD-10-CM | POA: Diagnosis not present

## 2023-02-25 DIAGNOSIS — Z87891 Personal history of nicotine dependence: Secondary | ICD-10-CM | POA: Diagnosis not present

## 2023-02-25 DIAGNOSIS — Z79899 Other long term (current) drug therapy: Secondary | ICD-10-CM | POA: Diagnosis not present

## 2023-02-25 DIAGNOSIS — J449 Chronic obstructive pulmonary disease, unspecified: Secondary | ICD-10-CM | POA: Diagnosis not present

## 2023-02-26 ENCOUNTER — Encounter: Payer: Self-pay | Admitting: Dermatology

## 2023-02-26 ENCOUNTER — Ambulatory Visit: Payer: Medicare HMO | Admitting: Dermatology

## 2023-02-26 DIAGNOSIS — R8290 Unspecified abnormal findings in urine: Secondary | ICD-10-CM | POA: Diagnosis not present

## 2023-02-26 DIAGNOSIS — L720 Epidermal cyst: Secondary | ICD-10-CM

## 2023-02-26 DIAGNOSIS — L821 Other seborrheic keratosis: Secondary | ICD-10-CM

## 2023-02-26 DIAGNOSIS — L578 Other skin changes due to chronic exposure to nonionizing radiation: Secondary | ICD-10-CM

## 2023-02-26 DIAGNOSIS — L814 Other melanin hyperpigmentation: Secondary | ICD-10-CM

## 2023-02-26 DIAGNOSIS — W908XXA Exposure to other nonionizing radiation, initial encounter: Secondary | ICD-10-CM

## 2023-02-26 DIAGNOSIS — L820 Inflamed seborrheic keratosis: Secondary | ICD-10-CM | POA: Diagnosis not present

## 2023-02-26 DIAGNOSIS — L988 Other specified disorders of the skin and subcutaneous tissue: Secondary | ICD-10-CM | POA: Diagnosis not present

## 2023-02-26 DIAGNOSIS — L729 Follicular cyst of the skin and subcutaneous tissue, unspecified: Secondary | ICD-10-CM

## 2023-02-26 NOTE — Patient Instructions (Addendum)
Cryotherapy Aftercare  Wash gently with soap and water everyday.   Apply Vaseline and Band-Aid daily until healed.      Pre-Operative Instructions  You are scheduled for a surgical procedure at [facility]. We recommend you read the following instructions. If you have any questions or concerns, please call the office at [phone].  Shower and wash the entire body with soap and water the day of your surgery paying special attention to cleansing at and around the planned surgery site.  Avoid aspirin or aspirin containing products at least fourteen (14) days prior to your surgical procedure and for at least one week (7 Days) after your surgical procedure. If you take aspirin on a regular basis for heart disease or history of stroke or for any other reason, we may recommend you continue taking aspirin but please notify us if you take this on a regular basis. Aspirin can cause more bleeding to occur during surgery as well as prolonged bleeding and bruising after surgery.   Avoid other nonsteroidal pain medications at least one week prior to surgery and at least one week prior to your surgery. These include medications such as Ibuprofen (Motrin, Advil and Nuprin), Naprosyn, Voltaren, Relafen, etc. If medications are used for therapeutic reasons, please inform us as they can cause increased bleeding or prolonged bleeding during and bruising after surgical procedures.   Please advise [place] if you are taking any "blood thinner" medications such as Coumadin or Dipyridamole or Plavix or similar medications. These cause increased bleeding and prolonged bleeding during procedures and bruising after surgical procedures. We may have to consider discontinuing these medications briefly prior to and shortly after your surgery if safe to do so.   Please inform us of all medications you are currently taking. All medications that are taken regularly should be taken the day of surgery as you always do.  Nevertheless, we need to be informed of what medications you are taking prior to surgery to know whether they will affect the procedure or cause any complications.   Please inform us of any medication allergies. Also inform us of whether you have allergies to Latex or rubber products or whether you have had any adverse reaction to Lidocaine or Epinephrine.  Please inform us of any prosthetic or artificial body parts such as artificial heart valve, joint replacements, etc., or similar condition that might require preoperative antibiotics.   We recommend avoidance of alcohol at least two weeks prior to surgery and continued avoidance for at least two weeks after surgery.   We recommend discontinuation of tobacco smoking at least two weeks prior to surgery and continued abstinence for at least two weeks after surgery.  Do not plan strenuous exercise, strenuous work or strenuous lifting for approximately four weeks after your surgery.   We request if you are unable to make your scheduled surgical appointment, please call us at least a week in advance or as soon as you are aware of a problem so that we can cancel or reschedule the appointment.   You MAY TAKE TYLENOL (acetaminophen) for pain as it is not a blood thinner.   PLEASE PLAN TO BE IN TOWN FOR TWO WEEKS FOLLOWING SURGERY, THIS IS IMPORTANT SO YOU CAN BE CHECKED FOR DRESSING CHANGES, SUTURE REMOVAL AND TO MONITOR FOR POSSIBLE COMPLICATIONS.   Due to recent changes in healthcare laws, you may see results of your pathology and/or laboratory studies on MyChart before the doctors have had a chance to review them. We understand  that in some cases there may be results that are confusing or concerning to you. Please understand that not all results are received at the same time and often the doctors may need to interpret multiple results in order to provide you with the best plan of care or course of treatment. Therefore, we ask that you please give [place] 2  business days to thoroughly review all your results before contacting the office for clarification. Should we see a critical lab result, you will be contacted sooner.   If You Need Anything After Your Visit  If you have any questions or concerns for your doctor, please call our main line at [phone] and press option 4 to reach your doctor's medical assistant. If no one answers, please leave a voicemail as directed and we will return your call as soon as possible. Messages left after 4 pm will be answered the following business day.   You may also send [place] a message via MyChart. We typically respond to MyChart messages within 1-2 business days.  For prescription refills, please ask your pharmacy to contact our office. Our fax number is [phone].  If you have an urgent issue when the clinic is closed that cannot wait until the next business day, you can page your doctor at the number below.    Please note that while we do our best to be available for urgent issues outside of office hours, we are not available 24/7.   If you have an urgent issue and are unable to reach [place], you may choose to seek medical care at your doctor's office, retail clinic, urgent care center, or emergency room.  If you have a medical emergency, please immediately call 911 or go to the emergency department.  Pager Numbers  - Dr. Gwen Pounds: [phone]  - Dr. Roseanne Reno: [phone]  - Dr. Katrinka Blazing: [phone]   In the event of inclement weather, please call our main line at [phone] for an update on the status of any delays or closures.  Dermatology Medication Tips: Please keep the boxes that topical medications come in in order to help keep track of the instructions about where and how to use these. Pharmacies typically print the medication instructions only on the boxes and not directly on the medication tubes.   If your medication is too expensive, please contact our office at [phone] option 4 or  send [place] a message through MyChart.   We are unable to tell what your co-pay for medications will be in advance as this is different depending on your insurance coverage. However, we may be able to find a substitute medication at lower cost or fill out paperwork to get insurance to cover a needed medication.   If a prior authorization is required to get your medication covered by your insurance company, please allow [place] 1-2 business days to complete this process.  Drug prices often vary depending on where the prescription is filled and some pharmacies may offer cheaper prices.  The website www.goodrx.com contains coupons for medications through different pharmacies. The prices here do not account for what the cost may be with help from insurance (it may be cheaper with your insurance), but the website can give you the price if you did not use any insurance.  - You can print the associated coupon and take it with your prescription to the pharmacy.  - You may also stop by our office during regular business hours and pick up a GoodRx coupon card.  - If you  need your prescription sent electronically to a different pharmacy, notify our office through [facility] or by phone at [phone] option 4.     Si Usted Necesita Algo Despus de Su Visita  Tambin puede enviarnos un mensaje a travs de Clinical cytogeneticist. Por lo general respondemos a los mensajes de MyChart en el transcurso de 1 a 2 das hbiles.  Para renovar recetas, por favor pida a su farmacia que se ponga en contacto con nuestra oficina. Annie Sable de fax es [place] [phone].  Si tiene un asunto urgente cuando la clnica est cerrada y que no puede esperar hasta el siguiente da hbil, puede llamar/localizar a su doctor(a) al nmero que aparece a continuacin.   Por favor, tenga en cuenta que aunque hacemos todo lo posible para estar disponibles para asuntos urgentes fuera del horario de [place], no estamos disponibles las 24 horas del  da, los 7 [address]  [address] de la [place].   Si tiene un problema urgente y no puede comunicarse con nosotros, puede optar por buscar atencin mdica  en el consultorio de su doctor(a), en una clnica privada, en un centro de atencin urgente o en una sala de emergencias.  Si tiene Engineer, drilling, por favor llame inmediatamente al 911 o vaya a la sala de emergencias.  Nmeros de bper  - Dr. Gwen Pounds: [phone]  - Dra. Roseanne Reno: [phone]  - Dr. Katrinka Blazing: [phone]   En caso de inclemencias del tiempo, por favor llame a Lacy Duverney principal al [phone] para una actualizacin sobre el [place] de cualquier retraso o cierre.  Consejos para la medicacin en dermatologa: Por favor, guarde las cajas en las que vienen los medicamentos de uso tpico para ayudarle a seguir las instrucciones sobre dnde y cmo usarlos. Las farmacias generalmente imprimen las instrucciones del medicamento slo en las cajas y no directamente en los tubos del [place].   Si su medicamento es muy caro, por favor, pngase en contacto con Rolm Gala llamando al [phone] y presione la opcin 4 o envenos un mensaje a travs de Clinical cytogeneticist.   No podemos decirle cul ser su copago por los medicamentos por adelantado ya que esto es diferente dependiendo de la cobertura de su seguro. Sin embargo, es posible que podamos encontrar un medicamento sustituto a Audiological scientist un formulario para que el seguro cubra el medicamento que se considera necesario.   Si se requiere una autorizacin previa para que su compaa de seguros [place] su medicamento, por favor permtanos de 1 a 2 das hbiles para completar [address]  Los precios de los medicamentos varan con frecuencia dependiendo del Environmental consultant de dnde se surte la receta y alguna farmacias pueden ofrecer precios ms baratos.  El sitio web www.goodrx.com tiene cupones para medicamentos de Health and safety inspector. Los precios aqu no tienen en cuenta lo que podra  costar con la ayuda del seguro (puede ser ms barato con su seguro), pero el sitio web puede darle el precio si no utiliz Tourist information centre manager.  - Puede imprimir el cupn correspondiente y llevarlo con su receta a la farmacia.  - Tambin puede pasar por nuestra oficina durante el horario de atencin regular y Education officer, museum una tarjeta de cupones de GoodRx.  - Si necesita que su receta se enve electrnicamente a una farmacia diferente, informe a nuestra oficina a travs de MyChart de [place] o por telfono llamando al [phone] y presione la opcin 4.

## 2023-02-26 NOTE — Progress Notes (Signed)
New Patient Visit   Subjective  Kayla Wu is a 78 y.o. female who presents for the following: red spot at left forearm, present for 2-3 years. Spot at right side of mouth that patient thinks could be a cyst.   No hx skin cancer.   The patient has spots, moles and lesions to be evaluated, some may be new or changing and the patient may have concern these could be cancer.   The following portions of the chart were reviewed this encounter and updated as appropriate: medications, allergies, medical history  Review of Systems:  No other skin or systemic complaints except as noted in HPI or Assessment and Plan.  Objective  Well appearing patient in no apparent distress; mood and affect are within normal limits.   A focused examination was performed of the following areas: Face, arms  Relevant exam findings are noted in the Assessment and Plan.  Left Forearm x 1 Erythematous stuck-on, waxy papule  Assessment & Plan   EPIDERMAL INCLUSION CYST Exam: Subcutaneous nodule at right perioral, 5 mm   Benign-appearing. Exam most consistent with an epidermal inclusion cyst. Discussed that a cyst is a benign growth that can grow over time and sometimes get irritated or inflamed. Recommend observation if it is not bothersome. Discussed option of surgical excision to remove it if it is growing, symptomatic, or other changes noted. Please call for new or changing lesions so they can be evaluated.  Cyst with symptoms and/or recent change.  Discussed surgical excision to remove, including resulting scar and possible recurrence.  Patient would like removed and will schedule for surgery. Pre-op information given.    LENTIGINES Exam: scattered tan macules Due to sun exposure Treatment Plan: Benign-appearing, observe. Recommend daily broad spectrum sunscreen SPF 30+ to sun-exposed areas, reapply every 2 hours as needed.  Call for any changes  SEBORRHEIC KERATOSIS - Stuck-on, waxy,  tan-brown papules and/or plaques  - Benign-appearing - Discussed benign etiology and prognosis. - Observe - Call for any changes  ACTINIC DAMAGE - chronic, secondary to cumulative UV radiation exposure/sun exposure over time - diffuse scaly erythematous macules with underlying dyspigmentation - Recommend daily broad spectrum sunscreen SPF 30+ to sun-exposed areas, reapply every 2 hours as needed.  - Recommend staying in the shade or wearing long sleeves, sun glasses (UVA+UVB protection) and wide brim hats (4-inch brim around the entire circumference of the hat). - Call for new or changing lesions.  FACIAL ELASTOSIS Exam: Rhytides and volume loss face  Treatment Plan: Recommend OTC Effaclar at bedtime as tolerated. Samples given.  Recommend daily broad spectrum sunscreen SPF 30+ to sun-exposed areas, reapply every 2 hours as needed. Call for new or changing lesions.  Staying in the shade or wearing long sleeves, sun glasses (UVA+UVB protection) and wide brim hats (4-inch brim around the entire circumference of the hat) are also recommended for sun protection.   INFLAMED SEBORRHEIC KERATOSIS Left Forearm x 1 Symptomatic, irritating, patient would like treated.  Benign-appearing.  Call clinic for new or changing lesions. RTC if doesn't clear  Destruction of lesion - Left Forearm x 1  Destruction method: cryotherapy   Informed consent: discussed and consent obtained   Lesion destroyed using liquid nitrogen: Yes   Region frozen until ice ball extended beyond lesion: Yes   Outcome: patient tolerated procedure well with no complications   Post-procedure details: wound care instructions given   Additional details:  Prior to procedure, discussed risks of blister formation, small wound, skin dyspigmentation,  or rare scar following cryotherapy. Recommend Vaseline ointment to treated areas while healing.   Return for Surgery for cyst at right perioral, with Dr. Roseanne Reno.  Anise Salvo, RMA, am acting as scribe for Willeen Niece, MD .   Documentation: I have reviewed the above documentation for accuracy and completeness, and I agree with the above.  Willeen Niece, MD

## 2023-03-17 DIAGNOSIS — K5903 Drug induced constipation: Secondary | ICD-10-CM | POA: Diagnosis not present

## 2023-03-17 DIAGNOSIS — I2699 Other pulmonary embolism without acute cor pulmonale: Secondary | ICD-10-CM | POA: Diagnosis not present

## 2023-03-17 DIAGNOSIS — I10 Essential (primary) hypertension: Secondary | ICD-10-CM | POA: Diagnosis not present

## 2023-03-17 DIAGNOSIS — J418 Mixed simple and mucopurulent chronic bronchitis: Secondary | ICD-10-CM | POA: Diagnosis not present

## 2023-03-17 DIAGNOSIS — J9611 Chronic respiratory failure with hypoxia: Secondary | ICD-10-CM | POA: Diagnosis not present

## 2023-03-17 DIAGNOSIS — R053 Chronic cough: Secondary | ICD-10-CM | POA: Diagnosis not present

## 2023-03-17 DIAGNOSIS — G4733 Obstructive sleep apnea (adult) (pediatric): Secondary | ICD-10-CM | POA: Diagnosis not present

## 2023-03-17 DIAGNOSIS — J432 Centrilobular emphysema: Secondary | ICD-10-CM | POA: Diagnosis not present

## 2023-03-18 DIAGNOSIS — C569 Malignant neoplasm of unspecified ovary: Secondary | ICD-10-CM | POA: Diagnosis not present

## 2023-03-26 ENCOUNTER — Encounter (INDEPENDENT_AMBULATORY_CARE_PROVIDER_SITE_OTHER): Payer: Self-pay | Admitting: Vascular Surgery

## 2023-03-26 ENCOUNTER — Ambulatory Visit (INDEPENDENT_AMBULATORY_CARE_PROVIDER_SITE_OTHER): Payer: Medicare HMO | Admitting: Vascular Surgery

## 2023-03-26 VITALS — BP 141/75 | HR 91 | Resp 18 | Ht 63.0 in | Wt 180.2 lb

## 2023-03-26 DIAGNOSIS — E119 Type 2 diabetes mellitus without complications: Secondary | ICD-10-CM | POA: Diagnosis not present

## 2023-03-26 DIAGNOSIS — I10 Essential (primary) hypertension: Secondary | ICD-10-CM | POA: Diagnosis not present

## 2023-03-26 DIAGNOSIS — I2782 Chronic pulmonary embolism: Secondary | ICD-10-CM | POA: Diagnosis not present

## 2023-03-26 DIAGNOSIS — M150 Primary generalized (osteo)arthritis: Secondary | ICD-10-CM | POA: Diagnosis not present

## 2023-03-26 DIAGNOSIS — J449 Chronic obstructive pulmonary disease, unspecified: Secondary | ICD-10-CM | POA: Diagnosis not present

## 2023-03-26 NOTE — Progress Notes (Signed)
 MRN : [id number]  Kayla Wu is a 78 y.o. ([birth year]) female who presents with chief complaint of legs hurt and swell.  History of Present Illness:   The patient presents to the office for follow up s/p IVC filter removal on 02/24/2023.  The patient is s/p left TKR with a history of DVT/PE.  Joint replacement was 12/22/2022.  IVC filter was placed approximately one week prior to joint surgery.     The patient notes their leg continues to be a little painful with prolonged dependency and the patient notes the leg does still swell some.  Symptoms are better with elevation.  The patient notes minimal edema in the morning which steadily worsens throughout the day.     The patient has not been using compression therapy at this point.   No SOB or pleuritic chest pains.  No cough or hemoptysis.   No blood per rectum or blood in any sputum.  No excessive bruising per the patient.   No recent shortening of the patient's walking distance or new symptoms consistent with claudication.  No history of rest pain symptoms. No new ulcers or wounds of the lower extremities have occurred.  No outpatient medications have been marked as taking for the 03/26/23 encounter (Appointment) with Gilda Crease, Latina Craver, MD.    Past Medical History:  Diagnosis Date   Anxiety    a.) on BZO PRN (lorazepam)   Aortic atherosclerosis (HCC)    CAD (coronary artery disease)    a.) MV 05/23/2014: EF 75%, no ischemia/artifact; b.) cCTA 09/16/2021: Ca2+ 340 (79th %ile) in pLAD and pLCx distributions   Chronic hypoxemic respiratory failure (HCC)    Chronic urticaria    COPD (chronic obstructive pulmonary disease) (HCC)    DDD (degenerative disc disease), lumbar    Depression    Diastolic dysfunction    a.) TTE 05/23/2014: EF >55%, no RWMAs, G1DD, triv AR, mild MR, RVSP 37.1; b.) TTE 01/19/2018: EF >55%, mild LVH, no RWMAs, G1DD, mild LAE, triv AR/MR/PR, mild TR, RVSP 36.1; c.) TTE 06/29/2020: EF 60-65%, no  RWMAs, G1DD, RVSP 14.9   GERD (gastroesophageal reflux disease)    Hepatic steatosis    History of 2019 novel coronavirus disease (COVID-19) 06/25/2020   HLD (hyperlipidemia)    Hypertension    Insomnia    a.) on hypnotic (zolpidem)   Menopausal state    Morbid obesity (HCC)    Non-cardiac chest pain    On rivaroxaban therapy    Osteoarthritis    Ovarian cancer (HCC) 2012   a.) s/p Tx with systemic chemotherapy   Oxygen desaturation during sleep    a.) on 1-2 L/[place] supplemental oxygen while sleeping   PAF (paroxysmal atrial fibrillation) (HCC) 06/2020   a.) Dx'd in setting of SARS-CoV-2 infection; b.) CHA2DS2-VASc = 8 (age x2, sex, HTN, VTE x2, vascular disease history, T2DM) as of 12/19/2022; c.) cardiac rate/rhythm maintained on oral diltiazem; chronically anticoagulated using rivaroxaban   Pneumonia 1987   Rectovaginal fistula    Sleep apnea    a.) does not utilize nocturnal PAP therapy   T2DM (type 2 diabetes mellitus) (HCC)    VTE (venous thromboembolism) 06/2020   a.) developed in setting of SARS-CoV-2 infection and A.fib with RVR    Past Surgical History:  Procedure Laterality Date   ABDOMINAL HYSTERECTOMY     APPENDECTOMY     CHOLECYSTECTOMY     ESOPHAGOGASTRODUODENOSCOPY (EGD) WITH PROPOFOL N/A 06/28/2021   Procedure: ESOPHAGOGASTRODUODENOSCOPY (  EGD) WITH PROPOFOL;  Surgeon: Regis Bill, MD;  Location: [facility] ENDOSCOPY;  Service: Endoscopy;  Laterality: N/A;  DM, ON WARFARIN   FINGER ARTHROPLASTY Left 12/22/2014   Procedure: FINGER ARTHROPLASTY;  Surgeon: Myra Rude, MD;  Location: [facility] ORS;  Service: Orthopedics;  Laterality: Left;   IVC FILTER INSERTION N/A 12/16/2022   Procedure: IVC FILTER INSERTION;  Surgeon: Renford Dills, MD;  Location: [facility] INVASIVE CV LAB;  Service: Cardiovascular;  Laterality: N/A;   IVC FILTER REMOVAL N/A 02/24/2023   Procedure: IVC FILTER REMOVAL;  Surgeon: Renford Dills, MD;  Location: [facility] INVASIVE CV LAB;  Service:  Cardiovascular;  Laterality: N/A;   KNEE ARTHROPLASTY Left 12/22/2022   Procedure: COMPUTER ASSISTED TOTAL KNEE ARTHROPLASTY;  Surgeon: Donato Heinz, MD;  Location: [facility] ORS;  Service: Orthopedics;  Laterality: Left;   KNEE ARTHROSCOPY WITH SUBCHONDROPLASTY Left 06/27/2022   Procedure: Left knee arthroscopic chondroplasty, possible partial meniscectomy, and subchondroplasty to the medial femoral condyle;  Surgeon: Signa Kell, MD;  Location: [facility];  Service: Orthopedics;  Laterality: Left;  Diabetic    Social History Social History   Tobacco Use   Smoking status: Former    Current packs/day: 0.00    Average packs/day: 2.0 packs/day for 42.0 years (84.0 ttl pk-yrs)    Types: Cigarettes    Start date: 07/04/1965    Quit date: 07/05/2007    Years since quitting: 15.7   Smokeless tobacco: Never  Vaping Use   Vaping status: Never Used  Substance Use Topics   Alcohol use: No    Comment: occasional glass of wine once a month   Drug use: No    Family History Family History  Problem Relation Age of Onset   Breast cancer Mother 66   Emphysema Father        smoked   Colon cancer Sister    Uterine cancer Sister    Throat cancer Brother     Allergies  Allergen Reactions   Iodinated Contrast Media Hives, Shortness Of Breath and Itching    Patient states she had itching, hives, and SOB after injection.    Iohexol Itching    Breakthrough contrast reaction.  Patient took premedication (13 hr prep) prior to contrasted scan for prior episode itching /nausea/ headache on 01/25/2019 with contrast. Patient experienced breakthrough severe itching and redness of the skin after contrast admin after having taken prep. See Contrast reaction note 04/29/2019 for further details.     REVIEW OF SYSTEMS (Negative unless checked)  Constitutional: [] Weight loss  [] Fever  [] Chills Cardiac: [] Chest pain   [] Chest pressure   [] Palpitations   [] Shortness of breath when laying flat    [] Shortness of breath with exertion. Vascular:  [] Pain in legs with walking   [x] Pain in legs at rest  [] History of DVT   [] Phlebitis   [x] Swelling in legs   [] Varicose veins   [] Non-healing ulcers Pulmonary:   [] Uses home oxygen   [] Productive cough   [] Hemoptysis   [] Wheeze  [] COPD   [] Asthma Neurologic:  [] Dizziness   [] Seizures   [] History of stroke   [] History of TIA  [] Aphasia   [] Vissual changes   [] Weakness or numbness in arm   [] Weakness or numbness in leg Musculoskeletal:   [] Joint swelling   [] Joint pain   [] Low back pain Hematologic:  [] Easy bruising  [] Easy bleeding   [] Hypercoagulable state   [] Anemic Gastrointestinal:  [] Diarrhea   [] Vomiting  [] Gastroesophageal reflux/heartburn   [] Difficulty swallowing. Genitourinary:  [] Chronic kidney  disease   [] Difficult urination  [] Frequent urination   [] Blood in urine Skin:  [] Rashes   [] Ulcers  Psychological:  [] History of anxiety   []  History of major depression.  Physical Examination  There were no vitals filed for this visit. There is no height or weight on file to calculate BMI. Gen: WD/WN, NAD Head: [place]/AT, No temporalis wasting.  Ear/Nose/Throat: Hearing grossly intact, nares w/o erythema or drainage, pinna without lesions Eyes: PER, EOMI, sclera nonicteric.  Neck: Supple, no gross masses.  No JVD.  Pulmonary:  Good air movement, no audible wheezing, no use of accessory muscles.  Cardiac: RRR, precordium not hyperdynamic. Vascular:  scattered varicosities present bilaterally.  Moderate venous stasis changes to the legs bilaterally.  Trace soft pitting edema. CEAP [id number]   Vessel Right Left  Radial Palpable Palpable  Gastrointestinal: soft, non-distended. No guarding/no peritoneal signs.  Musculoskeletal: M/S 5/5 throughout.  No deformity.  Neurologic: CN 2-12 intact. Pain and light touch intact in extremities.  Symmetrical.  Speech is fluent. Motor exam as listed above. Psychiatric: Judgment intact, Mood & affect  appropriate for pt's clinical situation. Dermatologic: Venous rashes no ulcers noted.  No changes consistent with cellulitis. Lymph : No lichenification or skin changes of chronic lymphedema.  CBC Lab Results  Component Value Date   WBC 12.0 (H) 10/09/2020   HGB 11.9 (L) 10/09/2020   HCT 35.8 (L) 10/09/2020   MCV 95.2 10/09/2020   PLT 440 (H) 10/09/2020    BMET    Component Value Date/Time   NA 137 09/05/2021 1244   K 4.4 09/05/2021 1244   CL 107 09/05/2021 1244   CO2 21 (L) 09/05/2021 1244   GLUCOSE 97 09/05/2021 1244   BUN 18 09/05/2021 1244   CREATININE 0.89 09/05/2021 1244   CALCIUM 9.0 09/05/2021 1244   GFRNONAA >60 09/05/2021 1244   CrCl cannot be calculated (Patient's most recent lab result is older than the maximum 21 days allowed.).  COAG No results found for: "INR", "PROTIME"  Radiology PERIPHERAL VASCULAR CATHETERIZATION Result Date: 02/24/2023 See surgical note for result.    Assessment/Plan 1. Chronic pulmonary embolism, unspecified pulmonary embolism type, unspecified whether acute cor pulmonale present (HCC) (Primary) Recommend:   No further surgery or intervention at this point in time.  IVC filter has been successfully removed.  The patient is on anticoagulation   Elevation was stressed, use of a recliner was discussed.  Graduated compression should be worn on a daily basis. The patient should wear compression beginning first thing in the morning and removing them in the evening. The patient is instructed specifically not to sleep in the stockings.  In addition, behavioral modification including elevation during the day and avoidance of prolonged dependency will be initiated.    The patient will continue anticoagulation for now as there have not been any problems or complications from anticoagulation therapy at this point.    The patient will follow-up with me as needed.  2. Chronic obstructive pulmonary disease, unspecified COPD type  (HCC) Continue pulmonary medications and aerosols as already ordered, these medications have been reviewed and there are no changes at this time.   3. Type 2 diabetes mellitus without complication, without long-term current use of insulin (HCC) Continue hypoglycemic medications as already ordered, these medications have been reviewed and there are no changes at this time.  Hgb A1C to be monitored as already arranged by primary service  4. Primary osteoarthritis involving multiple joints Continue medications to treat the patient's degenerative disease as  already ordered, these medications have been reviewed and there are no changes at this time.  Continued activity and therapy was stressed.  5. Benign essential HTN Continue antihypertensive medications as already ordered, these medications have been reviewed and there are no changes at this time.    Levora Dredge, MD  03/26/2023 1:40 PM

## 2023-03-30 ENCOUNTER — Other Ambulatory Visit: Payer: Self-pay | Admitting: Cardiovascular Disease

## 2023-03-30 DIAGNOSIS — I4891 Unspecified atrial fibrillation: Secondary | ICD-10-CM

## 2023-03-31 NOTE — Telephone Encounter (Signed)
 Prescription refill request for Xarelto received.  Indication:afib Last office visit:9/24 Weight:81.7  kg Age:78 Scr:0.9  1/25 CrCl:66.44  ml/min  Prescription refilled

## 2023-04-06 DIAGNOSIS — C563 Malignant neoplasm of bilateral ovaries: Secondary | ICD-10-CM | POA: Diagnosis not present

## 2023-04-06 DIAGNOSIS — Z8543 Personal history of malignant neoplasm of ovary: Secondary | ICD-10-CM | POA: Diagnosis not present

## 2023-04-06 DIAGNOSIS — Z08 Encounter for follow-up examination after completed treatment for malignant neoplasm: Secondary | ICD-10-CM | POA: Diagnosis not present

## 2023-04-08 DIAGNOSIS — C563 Malignant neoplasm of bilateral ovaries: Secondary | ICD-10-CM | POA: Diagnosis not present

## 2023-04-08 DIAGNOSIS — K5904 Chronic idiopathic constipation: Secondary | ICD-10-CM | POA: Diagnosis not present

## 2023-04-08 DIAGNOSIS — C569 Malignant neoplasm of unspecified ovary: Secondary | ICD-10-CM | POA: Diagnosis not present

## 2023-04-23 DIAGNOSIS — E1159 Type 2 diabetes mellitus with other circulatory complications: Secondary | ICD-10-CM | POA: Diagnosis not present

## 2023-04-23 DIAGNOSIS — I10 Essential (primary) hypertension: Secondary | ICD-10-CM | POA: Diagnosis not present

## 2023-05-11 ENCOUNTER — Encounter: Payer: Medicare HMO | Admitting: Dermatology

## 2023-05-20 ENCOUNTER — Other Ambulatory Visit: Payer: Self-pay | Admitting: Internal Medicine

## 2023-05-20 DIAGNOSIS — Z794 Long term (current) use of insulin: Secondary | ICD-10-CM | POA: Diagnosis not present

## 2023-05-20 DIAGNOSIS — Z1231 Encounter for screening mammogram for malignant neoplasm of breast: Secondary | ICD-10-CM | POA: Diagnosis not present

## 2023-05-20 DIAGNOSIS — Z79899 Other long term (current) drug therapy: Secondary | ICD-10-CM | POA: Diagnosis not present

## 2023-05-20 DIAGNOSIS — I10 Essential (primary) hypertension: Secondary | ICD-10-CM | POA: Diagnosis not present

## 2023-05-20 DIAGNOSIS — D649 Anemia, unspecified: Secondary | ICD-10-CM | POA: Diagnosis not present

## 2023-05-20 DIAGNOSIS — E119 Type 2 diabetes mellitus without complications: Secondary | ICD-10-CM | POA: Diagnosis not present

## 2023-05-20 DIAGNOSIS — M539 Dorsopathy, unspecified: Secondary | ICD-10-CM | POA: Diagnosis not present

## 2023-05-20 DIAGNOSIS — E7800 Pure hypercholesterolemia, unspecified: Secondary | ICD-10-CM | POA: Diagnosis not present

## 2023-06-23 ENCOUNTER — Encounter (INDEPENDENT_AMBULATORY_CARE_PROVIDER_SITE_OTHER): Payer: Self-pay

## 2023-07-03 ENCOUNTER — Ambulatory Visit
Admission: RE | Admit: 2023-07-03 | Discharge: 2023-07-03 | Disposition: A | Discharge status: Home / Self Care | Source: Ambulatory Visit | Attending: Internal Medicine | Admitting: Internal Medicine

## 2023-07-03 DIAGNOSIS — Z1231 Encounter for screening mammogram for malignant neoplasm of breast: Secondary | ICD-10-CM | POA: Diagnosis not present

## 2023-07-06 DIAGNOSIS — C569 Malignant neoplasm of unspecified ovary: Secondary | ICD-10-CM | POA: Diagnosis not present

## 2023-07-13 DIAGNOSIS — N1831 Chronic kidney disease, stage 3a: Secondary | ICD-10-CM | POA: Diagnosis not present

## 2023-07-13 DIAGNOSIS — Z794 Long term (current) use of insulin: Secondary | ICD-10-CM | POA: Diagnosis not present

## 2023-07-13 DIAGNOSIS — E1122 Type 2 diabetes mellitus with diabetic chronic kidney disease: Secondary | ICD-10-CM | POA: Diagnosis not present

## 2023-07-16 DIAGNOSIS — E1122 Type 2 diabetes mellitus with diabetic chronic kidney disease: Secondary | ICD-10-CM | POA: Diagnosis not present

## 2023-07-16 DIAGNOSIS — M1612 Unilateral primary osteoarthritis, left hip: Secondary | ICD-10-CM | POA: Diagnosis not present

## 2023-07-16 DIAGNOSIS — N1831 Chronic kidney disease, stage 3a: Secondary | ICD-10-CM | POA: Diagnosis not present

## 2023-07-16 DIAGNOSIS — Z794 Long term (current) use of insulin: Secondary | ICD-10-CM | POA: Diagnosis not present

## 2023-07-16 DIAGNOSIS — E1159 Type 2 diabetes mellitus with other circulatory complications: Secondary | ICD-10-CM | POA: Diagnosis not present

## 2023-07-16 DIAGNOSIS — M4726 Other spondylosis with radiculopathy, lumbar region: Secondary | ICD-10-CM | POA: Diagnosis not present

## 2023-07-16 DIAGNOSIS — R1032 Left lower quadrant pain: Secondary | ICD-10-CM | POA: Diagnosis not present

## 2023-07-16 DIAGNOSIS — M25552 Pain in left hip: Secondary | ICD-10-CM | POA: Diagnosis not present

## 2023-07-16 DIAGNOSIS — E1129 Type 2 diabetes mellitus with other diabetic kidney complication: Secondary | ICD-10-CM | POA: Diagnosis not present

## 2023-07-16 DIAGNOSIS — G8929 Other chronic pain: Secondary | ICD-10-CM | POA: Diagnosis not present

## 2023-07-16 DIAGNOSIS — R809 Proteinuria, unspecified: Secondary | ICD-10-CM | POA: Diagnosis not present

## 2023-08-04 DIAGNOSIS — Z794 Long term (current) use of insulin: Secondary | ICD-10-CM | POA: Diagnosis not present

## 2023-08-04 DIAGNOSIS — I10 Essential (primary) hypertension: Secondary | ICD-10-CM | POA: Diagnosis not present

## 2023-08-04 DIAGNOSIS — N1831 Chronic kidney disease, stage 3a: Secondary | ICD-10-CM | POA: Diagnosis not present

## 2023-08-04 DIAGNOSIS — E1122 Type 2 diabetes mellitus with diabetic chronic kidney disease: Secondary | ICD-10-CM | POA: Diagnosis not present

## 2023-08-13 DIAGNOSIS — E66812 Obesity, class 2: Secondary | ICD-10-CM | POA: Diagnosis not present

## 2023-08-13 DIAGNOSIS — J441 Chronic obstructive pulmonary disease with (acute) exacerbation: Secondary | ICD-10-CM | POA: Diagnosis not present

## 2023-08-13 DIAGNOSIS — E119 Type 2 diabetes mellitus without complications: Secondary | ICD-10-CM | POA: Diagnosis not present

## 2023-08-13 DIAGNOSIS — Z0000 Encounter for general adult medical examination without abnormal findings: Secondary | ICD-10-CM | POA: Diagnosis not present

## 2023-08-13 DIAGNOSIS — I10 Essential (primary) hypertension: Secondary | ICD-10-CM | POA: Diagnosis not present

## 2023-08-13 DIAGNOSIS — Z1211 Encounter for screening for malignant neoplasm of colon: Secondary | ICD-10-CM | POA: Diagnosis not present

## 2023-08-13 DIAGNOSIS — Z79899 Other long term (current) drug therapy: Secondary | ICD-10-CM | POA: Diagnosis not present

## 2023-08-13 DIAGNOSIS — J449 Chronic obstructive pulmonary disease, unspecified: Secondary | ICD-10-CM | POA: Diagnosis not present

## 2023-08-13 DIAGNOSIS — Z1331 Encounter for screening for depression: Secondary | ICD-10-CM | POA: Diagnosis not present

## 2023-08-13 DIAGNOSIS — E7800 Pure hypercholesterolemia, unspecified: Secondary | ICD-10-CM | POA: Diagnosis not present

## 2023-08-27 DIAGNOSIS — G8929 Other chronic pain: Secondary | ICD-10-CM | POA: Diagnosis not present

## 2023-08-27 DIAGNOSIS — M25552 Pain in left hip: Secondary | ICD-10-CM | POA: Diagnosis not present

## 2023-08-27 DIAGNOSIS — Z96652 Presence of left artificial knee joint: Secondary | ICD-10-CM | POA: Diagnosis not present

## 2023-08-27 DIAGNOSIS — M4726 Other spondylosis with radiculopathy, lumbar region: Secondary | ICD-10-CM | POA: Diagnosis not present

## 2023-08-27 DIAGNOSIS — M4807 Spinal stenosis, lumbosacral region: Secondary | ICD-10-CM | POA: Diagnosis not present

## 2023-08-27 DIAGNOSIS — M1612 Unilateral primary osteoarthritis, left hip: Secondary | ICD-10-CM | POA: Diagnosis not present

## 2023-08-27 DIAGNOSIS — R1032 Left lower quadrant pain: Secondary | ICD-10-CM | POA: Diagnosis not present

## 2023-09-03 DIAGNOSIS — M1612 Unilateral primary osteoarthritis, left hip: Secondary | ICD-10-CM | POA: Diagnosis not present

## 2023-09-03 DIAGNOSIS — M25452 Effusion, left hip: Secondary | ICD-10-CM | POA: Diagnosis not present

## 2023-09-07 ENCOUNTER — Other Ambulatory Visit: Payer: Self-pay | Admitting: Student

## 2023-09-07 DIAGNOSIS — G8929 Other chronic pain: Secondary | ICD-10-CM

## 2023-09-07 DIAGNOSIS — M1612 Unilateral primary osteoarthritis, left hip: Secondary | ICD-10-CM

## 2023-09-07 DIAGNOSIS — M4807 Spinal stenosis, lumbosacral region: Secondary | ICD-10-CM

## 2023-09-10 ENCOUNTER — Ambulatory Visit
Admission: RE | Admit: 2023-09-10 | Discharge: 2023-09-10 | Disposition: A | Discharge status: Home / Self Care | Source: Ambulatory Visit | Attending: Student | Admitting: Student

## 2023-09-10 DIAGNOSIS — G8929 Other chronic pain: Secondary | ICD-10-CM

## 2023-09-10 DIAGNOSIS — M4807 Spinal stenosis, lumbosacral region: Secondary | ICD-10-CM | POA: Diagnosis not present

## 2023-09-10 DIAGNOSIS — M1612 Unilateral primary osteoarthritis, left hip: Secondary | ICD-10-CM | POA: Insufficient documentation

## 2023-09-10 DIAGNOSIS — M48061 Spinal stenosis, lumbar region without neurogenic claudication: Secondary | ICD-10-CM | POA: Diagnosis not present

## 2023-09-10 DIAGNOSIS — M25552 Pain in left hip: Secondary | ICD-10-CM | POA: Diagnosis not present

## 2023-09-10 DIAGNOSIS — S73192A Other sprain of left hip, initial encounter: Secondary | ICD-10-CM | POA: Diagnosis not present

## 2023-09-10 DIAGNOSIS — R609 Edema, unspecified: Secondary | ICD-10-CM | POA: Diagnosis not present

## 2023-09-10 DIAGNOSIS — M5127 Other intervertebral disc displacement, lumbosacral region: Secondary | ICD-10-CM | POA: Diagnosis not present

## 2023-09-10 DIAGNOSIS — M47816 Spondylosis without myelopathy or radiculopathy, lumbar region: Secondary | ICD-10-CM | POA: Diagnosis not present

## 2023-09-10 DIAGNOSIS — R1032 Left lower quadrant pain: Secondary | ICD-10-CM | POA: Insufficient documentation

## 2023-09-10 DIAGNOSIS — M5136 Other intervertebral disc degeneration, lumbar region with discogenic back pain only: Secondary | ICD-10-CM | POA: Diagnosis not present

## 2023-09-20 ENCOUNTER — Other Ambulatory Visit: Payer: Self-pay | Admitting: Cardiovascular Disease

## 2023-10-07 DIAGNOSIS — C569 Malignant neoplasm of unspecified ovary: Secondary | ICD-10-CM | POA: Diagnosis not present

## 2023-10-07 DIAGNOSIS — Z08 Encounter for follow-up examination after completed treatment for malignant neoplasm: Secondary | ICD-10-CM | POA: Diagnosis not present

## 2023-10-07 DIAGNOSIS — Z8543 Personal history of malignant neoplasm of ovary: Secondary | ICD-10-CM | POA: Diagnosis not present

## 2023-10-07 DIAGNOSIS — Z8505 Personal history of malignant neoplasm of liver: Secondary | ICD-10-CM | POA: Diagnosis not present

## 2023-10-07 DIAGNOSIS — Z9221 Personal history of antineoplastic chemotherapy: Secondary | ICD-10-CM | POA: Diagnosis not present

## 2023-10-07 DIAGNOSIS — Z95828 Presence of other vascular implants and grafts: Secondary | ICD-10-CM | POA: Diagnosis not present

## 2023-10-09 ENCOUNTER — Other Ambulatory Visit: Payer: Self-pay | Admitting: Cardiovascular Disease

## 2023-10-20 DIAGNOSIS — M48062 Spinal stenosis, lumbar region with neurogenic claudication: Secondary | ICD-10-CM | POA: Diagnosis not present

## 2023-10-20 DIAGNOSIS — M5416 Radiculopathy, lumbar region: Secondary | ICD-10-CM | POA: Diagnosis not present

## 2023-10-23 DIAGNOSIS — M25452 Effusion, left hip: Secondary | ICD-10-CM | POA: Diagnosis not present

## 2023-10-23 DIAGNOSIS — M1612 Unilateral primary osteoarthritis, left hip: Secondary | ICD-10-CM | POA: Diagnosis not present

## 2023-10-23 DIAGNOSIS — M25552 Pain in left hip: Secondary | ICD-10-CM | POA: Diagnosis not present

## 2023-11-05 DIAGNOSIS — M9905 Segmental and somatic dysfunction of pelvic region: Secondary | ICD-10-CM | POA: Diagnosis not present

## 2023-11-05 DIAGNOSIS — M5451 Vertebrogenic low back pain: Secondary | ICD-10-CM | POA: Diagnosis not present

## 2023-11-05 DIAGNOSIS — M9902 Segmental and somatic dysfunction of thoracic region: Secondary | ICD-10-CM | POA: Diagnosis not present

## 2023-11-05 DIAGNOSIS — M9903 Segmental and somatic dysfunction of lumbar region: Secondary | ICD-10-CM | POA: Diagnosis not present

## 2023-11-10 DIAGNOSIS — M9905 Segmental and somatic dysfunction of pelvic region: Secondary | ICD-10-CM | POA: Diagnosis not present

## 2023-11-10 DIAGNOSIS — M9902 Segmental and somatic dysfunction of thoracic region: Secondary | ICD-10-CM | POA: Diagnosis not present

## 2023-11-10 DIAGNOSIS — M9903 Segmental and somatic dysfunction of lumbar region: Secondary | ICD-10-CM | POA: Diagnosis not present

## 2023-11-10 DIAGNOSIS — M5416 Radiculopathy, lumbar region: Secondary | ICD-10-CM | POA: Diagnosis not present

## 2023-11-10 DIAGNOSIS — M5451 Vertebrogenic low back pain: Secondary | ICD-10-CM | POA: Diagnosis not present

## 2023-11-10 DIAGNOSIS — M48062 Spinal stenosis, lumbar region with neurogenic claudication: Secondary | ICD-10-CM | POA: Diagnosis not present

## 2023-11-13 DIAGNOSIS — M9902 Segmental and somatic dysfunction of thoracic region: Secondary | ICD-10-CM | POA: Diagnosis not present

## 2023-11-13 DIAGNOSIS — M9903 Segmental and somatic dysfunction of lumbar region: Secondary | ICD-10-CM | POA: Diagnosis not present

## 2023-11-13 DIAGNOSIS — M9905 Segmental and somatic dysfunction of pelvic region: Secondary | ICD-10-CM | POA: Diagnosis not present

## 2023-11-13 DIAGNOSIS — M5451 Vertebrogenic low back pain: Secondary | ICD-10-CM | POA: Diagnosis not present

## 2023-11-16 DIAGNOSIS — M5451 Vertebrogenic low back pain: Secondary | ICD-10-CM | POA: Diagnosis not present

## 2023-11-16 DIAGNOSIS — M9902 Segmental and somatic dysfunction of thoracic region: Secondary | ICD-10-CM | POA: Diagnosis not present

## 2023-11-16 DIAGNOSIS — M9903 Segmental and somatic dysfunction of lumbar region: Secondary | ICD-10-CM | POA: Diagnosis not present

## 2023-11-16 DIAGNOSIS — M9905 Segmental and somatic dysfunction of pelvic region: Secondary | ICD-10-CM | POA: Diagnosis not present

## 2023-11-18 DIAGNOSIS — E7800 Pure hypercholesterolemia, unspecified: Secondary | ICD-10-CM | POA: Diagnosis not present

## 2023-11-18 DIAGNOSIS — Z794 Long term (current) use of insulin: Secondary | ICD-10-CM | POA: Diagnosis not present

## 2023-11-18 DIAGNOSIS — Z6835 Body mass index (BMI) 35.0-35.9, adult: Secondary | ICD-10-CM | POA: Diagnosis not present

## 2023-11-18 DIAGNOSIS — E66812 Obesity, class 2: Secondary | ICD-10-CM | POA: Diagnosis not present

## 2023-11-18 DIAGNOSIS — E119 Type 2 diabetes mellitus without complications: Secondary | ICD-10-CM | POA: Diagnosis not present

## 2023-11-18 DIAGNOSIS — Z79899 Other long term (current) drug therapy: Secondary | ICD-10-CM | POA: Diagnosis not present

## 2023-11-18 DIAGNOSIS — I10 Essential (primary) hypertension: Secondary | ICD-10-CM | POA: Diagnosis not present

## 2023-11-19 DIAGNOSIS — M5416 Radiculopathy, lumbar region: Secondary | ICD-10-CM | POA: Diagnosis not present

## 2023-11-19 DIAGNOSIS — M48062 Spinal stenosis, lumbar region with neurogenic claudication: Secondary | ICD-10-CM | POA: Diagnosis not present

## 2023-11-24 DIAGNOSIS — I10 Essential (primary) hypertension: Secondary | ICD-10-CM | POA: Diagnosis not present

## 2023-11-24 DIAGNOSIS — E1159 Type 2 diabetes mellitus with other circulatory complications: Secondary | ICD-10-CM | POA: Diagnosis not present

## 2023-11-25 DIAGNOSIS — Z86711 Personal history of pulmonary embolism: Secondary | ICD-10-CM | POA: Diagnosis not present

## 2023-11-25 DIAGNOSIS — C569 Malignant neoplasm of unspecified ovary: Secondary | ICD-10-CM | POA: Diagnosis not present

## 2023-11-25 DIAGNOSIS — J4489 Other specified chronic obstructive pulmonary disease: Secondary | ICD-10-CM | POA: Diagnosis not present

## 2023-11-26 ENCOUNTER — Telehealth: Payer: Self-pay

## 2023-11-26 NOTE — Telephone Encounter (Signed)
 Spoke with Ms. Tretter and she reports her insurance will no longer be accepted at [facility] so she needs to transfer care. She remains under every 3 month surveillance. Last seen 10/2024. We will arrange her next exam 01/2025 with labs. We will call her once arranged.

## 2023-11-26 NOTE — Telephone Encounter (Signed)
 Referrals received from Dr. Aleskrov for radiation and medical oncology for history of ovarian cancer. Voicemail left with Kayla Wu to return call to clarify as she receives all of her oncology care at [facility].

## 2023-11-27 ENCOUNTER — Other Ambulatory Visit: Payer: Self-pay

## 2023-11-27 ENCOUNTER — Telehealth: Payer: Self-pay

## 2023-11-27 DIAGNOSIS — C569 Malignant neoplasm of unspecified ovary: Secondary | ICD-10-CM

## 2023-11-27 NOTE — Telephone Encounter (Signed)
 Spoke with Kayla Wu and provided appointment with gyn oncology 01/06/2024 at 11. We will proceed with port flush/lab following.

## 2023-12-02 ENCOUNTER — Other Ambulatory Visit: Payer: Self-pay | Admitting: Cardiovascular Disease

## 2023-12-03 ENCOUNTER — Ambulatory Visit: Attending: Nurse Practitioner | Admitting: Nurse Practitioner

## 2023-12-03 ENCOUNTER — Encounter: Payer: Self-pay | Admitting: Nurse Practitioner

## 2023-12-03 VITALS — BP 124/40 | HR 75 | Temp 98.2°F | Ht 64.0 in | Wt 190.4 lb

## 2023-12-03 DIAGNOSIS — E782 Mixed hyperlipidemia: Secondary | ICD-10-CM | POA: Diagnosis not present

## 2023-12-03 DIAGNOSIS — I82409 Acute embolism and thrombosis of unspecified deep veins of unspecified lower extremity: Secondary | ICD-10-CM

## 2023-12-03 DIAGNOSIS — I480 Paroxysmal atrial fibrillation: Secondary | ICD-10-CM

## 2023-12-03 DIAGNOSIS — E119 Type 2 diabetes mellitus without complications: Secondary | ICD-10-CM

## 2023-12-03 DIAGNOSIS — R9431 Abnormal electrocardiogram [ECG] [EKG]: Secondary | ICD-10-CM | POA: Diagnosis not present

## 2023-12-03 DIAGNOSIS — I10 Essential (primary) hypertension: Secondary | ICD-10-CM | POA: Diagnosis not present

## 2023-12-03 NOTE — Patient Instructions (Signed)
 Medication Instructions:   Your physician recommends that you continue on your current medications as directed. Please refer to the Current Medication list given to you today.  *If you need a refill on your cardiac medications before your next appointment, please call your pharmacy*  Lab Work:  Your provider would like for you to have the following labs drawn: TODAY.  -CMET -Magnesium     If you have labs (blood work) drawn today and your tests are completely normal, you will receive your results only by: MyChart Message (if you have MyChart) OR A paper copy in the mail If you have any lab test that is abnormal or we need to change your treatment, we will call you to review the results.  Testing/Procedures:  NONE  Follow-Up: At [facility], you and your health needs are our priority.  As part of our continuing mission to provide you with exceptional heart care, our providers are all part of one team.  This team includes your primary Cardiologist (physician) and Advanced Practice Providers or APPs (Physician Assistants and Nurse Practitioners) who all work together to provide you with the care you need, when you need it.  Your next appointment:   6 month(s)  Provider:   You may see Timothy Gollan, MD or one of the following Advanced Practice Providers on your designated Care Team:    Lonni Meager, NP   We recommend signing up for the patient portal called MyChart.  Sign up information is provided on this After Visit Summary.  MyChart is used to connect with patients for Virtual Visits (Telemedicine).  Patients are able to view lab/test results, encounter notes, upcoming appointments, etc.  Non-urgent messages can be sent to your provider as well.   To learn more about what you can do with MyChart, go to [email or web address].

## 2023-12-03 NOTE — Telephone Encounter (Signed)
 Pt contact pt for future appointment. Pt overdue for follow up. Pt requesting refills.

## 2023-12-03 NOTE — Progress Notes (Signed)
 Office Visit    Patient Name: Kayla Wu Date of Encounter: 12/03/2023  Primary Care Provider:  Auston Reyes BIRCH, MD Primary Cardiologist:  Evalene Lunger, MD    Chief Complaint    78 y.o. female with a history of paroxysmal atrial fibrillation, remote tobacco abuse (42 pack years), COPD, diabetes, hypertension, hyperlipidemia, GERD, obesity, metastatic ovarian cancer, and lower extremity DVT in the setting of COVID-19 infection, who presents for follow-up related to paroxysmal atrial fibrillation.  Past Medical History   Subjective   Past Medical History:  Diagnosis Date   Anxiety    a.) on BZO PRN (lorazepam )   Aortic atherosclerosis    CAD (coronary artery disease) - non-obstructive    a.) MV 05/23/2014: EF 75%, no ischemia/artifact; b.) cCTA 09/16/2021: Ca2+ 340 (79th %ile). LM nl, LAD 25-40p, LCX dominant, <25p, RCA nondom, nl.   Chronic hypoxemic respiratory failure (HCC)    Chronic urticaria    COPD (chronic obstructive pulmonary disease) (HCC)    DDD (degenerative disc disease), lumbar    Depression    Diastolic dysfunction    a.) TTE 05/23/2014: EF >55%, no RWMAs, G1DD, triv AR, mild MR, RVSP 37.1; b.) TTE 01/19/2018: EF >55%, mild LVH, no RWMAs, G1DD, mild LAE, triv AR/MR/PR, mild TR, RVSP 36.1; c.) TTE 06/29/2020: EF 60-65%, no RWMAs, G1DD, RVSP 14.9   GERD (gastroesophageal reflux disease)    Hepatic steatosis    History of 2019 novel coronavirus disease (COVID-19) 06/25/2020   HLD (hyperlipidemia)    Hypertension    Insomnia    a.) on hypnotic (zolpidem )   Menopausal state    Morbid obesity (HCC)    Non-cardiac chest pain    On rivaroxaban  therapy    Osteoarthritis    Ovarian cancer (HCC) 2012   a.) s/p Tx with systemic chemotherapy   Oxygen  desaturation during sleep    a.) on 1-2 L/[place] supplemental oxygen  while sleeping   PAF (paroxysmal atrial fibrillation) (HCC) 06/2020   a. Dx'd in setting of SARS-CoV-2 infection; b. CHA2DS2-VASc = 8 (age  x2, sex, HTN, VTE x2, vascular disease history, T2DM) -->Xarelto .   Pneumonia 1987   Rectovaginal fistula    Sleep apnea    a.) does not utilize nocturnal PAP therapy   T2DM (type 2 diabetes mellitus) (HCC)    VTE (venous thromboembolism) 06/2020   a.) developed in setting of SARS-CoV-2 infection and A.fib with RVR->s/p IVC filter 12/2022 w/ retrieval 02/2023 (surrounding knee surgery).   Past Surgical History:  Procedure Laterality Date   ABDOMINAL HYSTERECTOMY     APPENDECTOMY     CHOLECYSTECTOMY     ESOPHAGOGASTRODUODENOSCOPY (EGD) WITH PROPOFOL  N/A 06/28/2021   Procedure: ESOPHAGOGASTRODUODENOSCOPY (EGD) WITH PROPOFOL ;  Surgeon: Maryruth Ole DASEN, MD;  Location: [facility];  Service: Endoscopy;  Laterality: N/A;  DM, ON WARFARIN   FINGER ARTHROPLASTY Left 12/22/2014   Procedure: FINGER ARTHROPLASTY;  Surgeon: Lonni Sharps, MD;  Location: [facility];  Service: Orthopedics;  Laterality: Left;   IVC FILTER INSERTION N/A 12/16/2022   Procedure: IVC FILTER INSERTION;  Surgeon: Jama Cordella MATSU, MD;  Location: [facility];  Service: Cardiovascular;  Laterality: N/A;   IVC FILTER REMOVAL N/A 02/24/2023   Procedure: IVC FILTER REMOVAL;  Surgeon: Jama Cordella MATSU, MD;  Location: [facility];  Service: Cardiovascular;  Laterality: N/A;   KNEE ARTHROPLASTY Left 12/22/2022   Procedure: COMPUTER ASSISTED TOTAL KNEE ARTHROPLASTY;  Surgeon: Mardee Lynwood SQUIBB, MD;  Location: [facility];  Service: Orthopedics;  Laterality: Left;   KNEE ARTHROSCOPY WITH SUBCHONDROPLASTY Left 06/27/2022   Procedure: Left knee arthroscopic chondroplasty, possible partial meniscectomy, and subchondroplasty to the medial femoral condyle;  Surgeon: Tobie Priest, MD;  Location: [facility];  Service: Orthopedics;  Laterality: Left;  Diabetic    Allergies  Allergies  Allergen Reactions   Iodinated Contrast Media Hives, Shortness Of Breath and Itching    Patient states she had itching,  hives, and SOB after injection.    Iohexol  Itching    Breakthrough contrast reaction.  Patient took premedication (13 hr prep) prior to contrasted scan for prior episode itching /nausea/ headache on 01/25/2019 with contrast. Patient experienced breakthrough severe itching and redness of the skin after contrast admin after having taken prep. See Contrast reaction note 04/29/2019 for further details.  Breakthrough contrast reaction.    Patient took premedication (13 hr prep) prior to contrasted scan for prior episode itching /nausea/ headache on 01/25/2019 with contrast. Patient experienced breakthrough severe itching and redness of the skin after contrast admin after having taken prep. See Contrast reaction note 04/29/2019 for further details.    Breakthrough contrast reaction. Patient took premedication (13 hr prep) prior to contrasted scan for prior episode itching /nausea/ headache on 01/25/2019 with contrast. Patient experienced breakthrough severe itching and redness of the skin after contrast admin after having taken prep. See Contrast reaction note 04/29/2019 for further details.       History of Present Illness      78 y.o. y/o female with a history of paroxysmal atrial fibrillation, noncardiac chest pain, remote tobacco abuse (42 pack years), COPD, diabetes, hypertension, hyperlipidemia, GERD, obesity, ovarian cancer, and lower extremity DVT in the setting of COVID-19 infection.  She was previously evaluated with stress testing in the setting of chest pain in 2016, which was normal.  Echocardiogram in 2019 showed normal LV function.  In May 2022, she was admitted with hypoxia and COVID-19 infection.  During hospitalization, she developed atrial fibrillation with rapid ventricular response, which was asymptomatic and subsequently converted to sinus rhythm within 3 hours.  She was placed on oral diltiazem  and Eliquis  therapy, which was later changed to warfarin and more recently rivaroxaban .   Echocardiogram during admission showed an EF of 60-65% without regional wall motion abnormalities, grade 1 diastolic dysfunction, and no significant valvular disease.  In August 2023, she reported chest pain and dyspnea.  Coronary CT angiogram showed calcium  score of 340 (79th percentile), and nonobstructive LAD and circumflex disease.   Ms. Deckard was last seen in cardiology clinic in 10/2022.  Over the past year, she has been experiencing groin pain in the setting of left hip arthritis status post injection about 6 weeks ago.  Pain has already recurred.  Because of her left hip pain, activity is limited.  She has chronic relatively stable dyspnea on exertion.  She does not experience chest pain.  She denies Objective   Home Medications    Current Outpatient Medications  Medication Sig Dispense Refill   acetaminophen  (TYLENOL ) 650 MG CR tablet Take 650 mg by mouth every 8 (eight) hours as needed.     albuterol  (VENTOLIN  HFA) 108 (90 Base) MCG/ACT inhaler Inhale 1-2 puffs into the lungs every 4 (four) hours as needed.     bisacodyl  (DULCOLAX) 5 MG EC tablet Take 5 mg by mouth as needed for moderate constipation.     bisoprolol  (ZEBETA ) 5 MG tablet TAKE 1 TABLET EVERY DAY 90 tablet 2   Camphor-Eucalyptus-Menthol  ([employer] MEDICATED CHEST RUB EX)  Apply 1 Application topically as needed. Bottom of feet     chlorthalidone (HYGROTON) 25 MG tablet Take 1 tablet by mouth daily.     diltiazem  (CARDIZEM  CD) 180 MG 24 hr capsule TAKE 1 CAPSULE EVERY DAY 30 capsule 0   Emollient (LUBRIDERM) LOTN Apply 1 Application topically daily as needed.     ferrous sulfate  325 (65 FE) MG EC tablet Take 325 mg by mouth 3 (three) times daily with meals.     gabapentin  (NEURONTIN ) 600 MG tablet Take 600 mg by mouth 2 (two) times daily.     HYDROcodone -acetaminophen  (NORCO) 10-325 MG tablet Take 1 tablet by mouth 2 (two) times daily as needed for moderate pain (pain score 4-6).     hydrOXYzine  (ATARAX ) 50 MG tablet Take 50 mg  by mouth at bedtime as needed (sleep).     insulin  NPH-regular Human (70-30) 100 UNIT/ML injection Inject 25 Units into the skin daily as needed (blood sugar of 130 or higher).     LORazepam  (ATIVAN ) 1 MG tablet Take 1 mg by mouth daily.     losartan  (COZAAR ) 100 MG tablet Take 100 mg by mouth daily.     magnesium  oxide (MAG-OX) 400 MG tablet Take 400 mg by mouth in the morning, at noon, in the evening, and at bedtime.     metFORMIN  (GLUCOPHAGE -XR) 500 MG 24 hr tablet Take 1,500 mg by mouth daily with supper.     omeprazole (PRILOSEC) 40 MG capsule Take 40 mg by mouth daily.     oxyCODONE  (OXY IR/ROXICODONE ) 5 MG immediate release tablet Take 1 tablet (5 mg total) by mouth every 4 (four) hours as needed for moderate pain (pain score 4-6) (pain score 4-6). 30 tablet 0   Polyethylene Glycol 3350 (MIRALAX PO) Take 17 g by mouth as needed.     rosuvastatin  (CRESTOR ) 10 MG tablet Take 10 mg by mouth daily.     Semaglutide, 2 MG/DOSE, (OZEMPIC, 2 MG/DOSE,) 8 MG/3ML SOPN Inject 2 mg into the skin once a week.     senna (SENOKOT) 8.6 MG TABS tablet Take 3 tablets by mouth daily.     Spacer/Aero-Holding Chambers (EASIVENT) inhaler Use as instructed with albuterol  inhaler     traMADol  (ULTRAM ) 50 MG tablet Take 1-2 tablets (50-100 mg total) by mouth every 4 (four) hours as needed for moderate pain (pain score 4-6). 30 tablet 0   venlafaxine  XR (EFFEXOR -XR) 150 MG 24 hr capsule Take 150 mg by mouth daily. (take with 37.5mg  capsule to equal 187.5mg  total)     vitamin B-12 (CYANOCOBALAMIN ) 1000 MCG tablet Take 1,000 mcg by mouth daily.     Vitamin D , Ergocalciferol , (DRISDOL) 1.25 MG (50000 UNIT) CAPS capsule Take 50,000 Units by mouth once a week.     XARELTO  20 MG TABS tablet TAKE 1 TABLET EVERY DAY WITH SUPPER 90 tablet 3   zolpidem  (AMBIEN ) 10 MG tablet Take 10 mg by mouth at bedtime as needed for sleep.     celecoxib  (CELEBREX ) 200 MG capsule Take 1 capsule (200 mg total) by mouth 2 (two) times daily.  (Patient not taking: Reported on 12/03/2023) 60 capsule 1   venlafaxine  XR (EFFEXOR -XR) 37.5 MG 24 hr capsule Take 37.5 mg by mouth daily. (take with 150mg  capsule to equal 187.5mg  total) (Patient not taking: Reported on 12/03/2023)     No current facility-administered medications for this visit.     Physical Exam    VS:  BP (!) 124/40 (BP Location: Left Arm, Patient  Position: Sitting, Cuff Size: Normal)   Pulse 75   Temp 98.2 F (36.8 C)   Ht 5' 4 (1.626 m)   Wt 190 lb 6 oz (86.4 kg)   SpO2 94%   BMI 32.68 kg/m  , BMI Body mass index is 32.68 kg/m.          GEN: Well nourished, well developed, in no acute distress. HEENT: normal. Neck: Supple, no JVD, carotid bruits, or masses. Cardiac: RRR, no murmurs, rubs, or gallops. No clubbing, cyanosis, edema.  Radials 2+/PT 2+ and equal bilaterally.  Respiratory:  Respirations regular and unlabored, clear to auscultation bilaterally. GI: Soft, nontender, nondistended, BS + x 4. MS: no deformity or atrophy. Skin: warm and dry, no rash. Neuro:  Strength and sensation are intact. Psych: Normal affect.  Accessory Clinical Findings    ECG personally reviewed by me today - EKG Interpretation Date/Time:  Thursday December 03 2023 15:19:15 EDT Ventricular Rate:  75 PR Interval:  170 QRS Duration:  84 QT Interval:  458 QTC Calculation: 511 R Axis:   2  Text Interpretation: Normal sinus rhythm Prolonged QT Confirmed by Vivienne Bruckner [phone]) on 12/03/2023 3:36:31 PM  - no acute changes.  Labs dated 08/13/2023 from [facility]:  Hemoglobin 11.4, hematocrit 34.2, WBC 9.3, platelets 381 Sodium 138, potassium 4.2, chloride 101, CO2 26.6, BUN 21, creatinine 0.9, glucose 136 Calcium  9.2, BUN 4.4, total protein 7.2 Total bilirubin 0.3, alkaline phosphatase 84, AST 17, ALT 17 Total cholesterol 169, triglycerides 248, HDL 56.5, LDL 63  Hemoglobin A1c 6.5 July 13, 2023    Assessment & Plan    1.  Paroxysmal atrial fibrillation:  Diagnosed in the setting of COVID-19 in early 2022.  Denies palpitations.  Maintaining sinus rhythm today on calcium  channel blocker and beta-blocker therapy.  She is chronically coagulated with rivaroxaban .  Following up complete metabolic panel and magnesium  today in the setting of prolonged QT.  2.  Prolonged QT: QTc is 511.  Patient says she has a history of hypomagnesemia and is on Mag-Ox 400 milligrams 3 times daily.  Follow-up complete metabolic panel and magnesium  today.  She is on venlafaxine , and pending lab work, this may need to be adjusted or switched to an alternate agent given potential contribution to QT prolongation.  3.  Primary hypertension: Stable on beta-blocker, calcium  channel blocker, ARB, and diuretic therapy.  4.  Hyperlipidemia: On rosuvastatin  with an LDL of 63 earlier this year.  LFTs were normal at that time.  4.  Type 2 diabetes mellitus: A1c of 6.5 in June.  She is on metformin , insulin , and semaglutide and followed by primary care.  5.  History of lower extremity DVT: On chronic Xarelto .  6.  Left hip pain/degenerative joint disease: Followed by Ortho.  7.  Disposition: Follow-up complete metabolic panel and magnesium  today.  Follow-up in clinic in 4 to 6 months or sooner if necessary.  Bruckner Vivienne, NP 12/03/2023, 5:33 PM

## 2023-12-04 ENCOUNTER — Ambulatory Visit: Payer: Self-pay | Admitting: Nurse Practitioner

## 2023-12-04 DIAGNOSIS — M25452 Effusion, left hip: Secondary | ICD-10-CM | POA: Diagnosis not present

## 2023-12-04 DIAGNOSIS — M4726 Other spondylosis with radiculopathy, lumbar region: Secondary | ICD-10-CM | POA: Diagnosis not present

## 2023-12-04 DIAGNOSIS — M1612 Unilateral primary osteoarthritis, left hip: Secondary | ICD-10-CM | POA: Diagnosis not present

## 2023-12-04 LAB — COMPREHENSIVE METABOLIC PANEL WITH GFR
ALT: 9 IU/L (ref 0–32)
AST: 11 IU/L (ref 0–40)
Albumin: 4.6 g/dL (ref 3.8–4.8)
Alkaline Phosphatase: 92 IU/L (ref 49–135)
BUN/Creatinine Ratio: 33 — ABNORMAL HIGH (ref 12–28)
BUN: 38 mg/dL — ABNORMAL HIGH (ref 8–27)
Bilirubin Total: 0.2 mg/dL (ref 0.0–1.2)
CO2: 22 mmol/L (ref 20–29)
Calcium: 9.3 mg/dL (ref 8.7–10.3)
Chloride: 96 mmol/L (ref 96–106)
Creatinine, Ser: 1.15 mg/dL — ABNORMAL HIGH (ref 0.57–1.00)
Globulin, Total: 2.4 g/dL (ref 1.5–4.5)
Glucose: 148 mg/dL — ABNORMAL HIGH (ref 70–99)
Potassium: 3.7 mmol/L (ref 3.5–5.2)
Sodium: 139 mmol/L (ref 134–144)
Total Protein: 7 g/dL (ref 6.0–8.5)
eGFR: 48 mL/min/1.73 — ABNORMAL LOW (ref >59)

## 2023-12-04 LAB — MAGNESIUM: Magnesium: 1.7 mg/dL (ref 1.6–2.3)

## 2023-12-04 NOTE — Progress Notes (Signed)
 [facility] Chief Complaint:   Chief Complaint  Patient presents with  . Left Hip - Pain, Follow-up    History of Present Illness:    Kayla Wu is a 78 y.o. female that presents to clinic today for follow up evaluation and management of ongoing left hip and groin pain.  They were last evaluated by myself on 10/23/2023.  At that time, the plan was to use a walker to unload the hip, over-the-counter medicine, then follow-up for reevaluation.  She had a left hip joint aspiration and steroid injection on 09/03/2023 which provided excellent symptom relief for about 6 weeks.  She has had recurrence of symptoms without new injury so she comes in today for reevaluation .  She is again accompanied by her husband.  At the time of this visit, there was no new information to review.  Her most recent evaluation by physiatry was on 10/20/2023 where she was coming in for her chronic pains.  There was question of spinal stenosis with neurogenic claudication pain since she was reporting some bilateral low back and buttock pains worse with standing and walking.  It was felt that her chronic groin pains were related to her hip joint.  She had a bilateral L3-4 transforaminal epidural steroid injection on 10/20/2023 as well as bilateral low L4-5 transforaminal epidural steroid injections which did not significantly help her groin pain.  She has also been prescribed prednisone  which did not help her pain either.  Her most recent available labs from 08/13/2023 show creatinine 0.9, normal electrolytes, normal liver function, albumin  4.4, stable decreased to hemoglobin.  Last A1c 6.5 was on 07/13/2023.  Today, the patient reports their symptoms are mostly in her left groin.  She has some pain at rest now but mostly pain with walking, getting in and out of the car.  Her symptoms are sharp.  She currently rates pain severity as a 8/10.  She reports associated limping, catching.  She  denies associated swelling, locking, instability, numbness or tingling, weakness, fevers or chills, night sweats, weight loss, skin color change.  She has also used Tiger balm, acetaminophen , gabapentin , prednisone  in addition to previous epidural steroid injections and hip joint aspiration and steroid injection.  She reported excellent symptom improvement from hip joint aspiration and steroid injection on 09/03/2023.  No change to her groin symptoms from bilateral L3-4 epidural steroid injection on 10/20/2023 and bilateral L4-5 epidural steroid injections on 11/19/2023.  She feels like her symptoms are significantly limiting her function.  Medications, Past Medical/Surgical/Family/Social History:   Current Outpatient Medications  Medication Sig Dispense Refill  . acetaminophen  (TYLENOL ) 650 MG ER tablet Take 650 mg by mouth every 8 (eight) hours as needed for Pain    . albuterol  MDI, PROVENTIL , VENTOLIN , PROAIR , HFA 90 mcg/actuation inhaler 1-2 puff as needed every 4 hours for shortness of breath or wheezing 3 each 1  . calcitonin, salmon, (MIACALCIN) 200 unit/actuation nasal spray Place 1 spray into the left nostril once daily for 30 days 3.7 mL 0  . chlorthalidone 25 MG tablet Take 1 tablet (25 mg total) by mouth once daily 90 tablet 3  . cyanocobalamin  (VITAMIN B12) 1000 MCG tablet Take 1,000 mcg by mouth once daily    . dilTIAZem  (CARDIZEM  CD) 180 MG CD capsule Take 180 mg by mouth once daily    . ergocalciferol , vitamin D2, 1,250 mcg (50,000 unit) capsule Take 1 capsule (50,000 Units total) by mouth once a week for  30 days 12 capsule 3  . ferrous sulfate  27 mg iron Tab Take 27 mg by mouth once daily    . fluticasone propion-salmeteroL (ADVAIR DISKUS) 250-50 mcg/dose diskus inhaler Inhale 1 Puff into the lungs every 12 (twelve) hours 60 each 6  . gabapentin  (NEURONTIN ) 600 MG tablet Take 1 tablet (600 mg total) by mouth 3 (three) times daily as needed 270 tablet 3  . HYDROcodone -acetaminophen   (NORCO) 10-325 mg tablet Take 1 tablet by mouth every 8 (eight) hours as needed for Pain 90 tablet 0  . hydrOXYzine  (ATARAX ) 50 MG tablet Take 1 tablet (50 mg total) by mouth 3 (three) times daily as needed for Itching or Anxiety 180 tablet 1  . inhalational spacer (AEROCHAMBER) spacer Use as instructed with albuterol  inhaler 1 each 2  . insulin  NPH-REGULAR (HUMULIN 70/30) 100 unit/mL (70-30) injection Inject 20 Units subcutaneously once daily as needed ((blood sugar of 130 or higher).)    . losartan  (COZAAR ) 50 MG tablet Take 1 tablet (50 mg total) by mouth 2 (two) times daily 180 tablet 3  . magnesium  oxide (MAG-OX) 400 mg (241.3 mg magnesium ) tablet Take 400 mg by mouth 2 (two) times daily    . metFORMIN  (GLUCOPHAGE -XR) 500 MG XR tablet Take 1 tablet (500 mg total) by mouth daily with dinner 90 tablet 3  . omeprazole (PRILOSEC) 40 MG DR capsule TAKE 1 CAPSULE TWICE DAILY BEFORE MEALS 180 capsule 3  . rivaroxaban  (XARELTO ) 20 mg tablet Take 20 mg by mouth daily with dinner    . rosuvastatin  (CRESTOR ) 10 MG tablet TAKE 1 TABLET ONE TIME DAILY 90 tablet 3  . semaglutide (OZEMPIC) 2 mg/dose (8 mg/3 mL) pen injector Inject 2 mg subcutaneously once a week Getting from patient assistance    . sennosides-docusate (SENOKOT-S) 8.6-50 mg tablet Take 2 tablets by mouth 2 (two) times daily    . venlafaxine  (EFFEXOR -XR) 150 MG XR capsule TAKE 1 CAPSULE EVERY DAY 90 capsule 3  . zolpidem  (AMBIEN ) 10 mg tablet TAKE 1 TABLET AT BEDTIME AS NEEDED FOR SLEEP (Patient taking differently: Take 5 mg by mouth at bedtime) 90 tablet 1   No current facility-administered medications for this visit.    SECONDARY CONDITIONS THAT INFLUENCE TREATMENT AND DECISION-MAKING:  Former smoker  Past Medical History: Obesity, hypertension, type 2 diabetes on insulin , atrial fibrillation and history of pulmonary embolism on Xarelto , COPD, hyperlipidemia on statin, GERD on PPI, fatty liver  Past Surgical History: Left total knee  replacement, laparotomy with colon resection and omentectomy, hysterectomy with bilateral salpingo-oophorectomy for ovarian cancer, appendectomy  Relevant Orthopedic Family History: None  Physical Examination:   BP 130/64   Ht 157.5 cm (5' 2)   Wt 86.2 kg (190 lb)   BMI 34.75 kg/m  General/Constitutional: Well-nourished, well developed, no apparent distress. Psych: Normal mood and affect.  Conversant.  Judgement intact. Musculoskeletal: Comprehensive Hip Exam: Inspection Gait Antalgic but fluid without ambulatory aid     Right Left  Skin Normal appearance with no obvious deformity.  No ecchymosis, erythema, rash, or lesions. Normal appearance with no obvious deformity.  No ecchymosis, erythema, rash, or lesions.  Atrophy None None   Tenderness                                         Right Left  No bony, muscular, or bursal TTP No bony, muscular, or bursal TTP  ROM  Right Left  Flexion/Extension Full to 110 without pain.  Limited extension. Limited to 100 with pain.  Limited extension.  90 degree IR Normal IR to 15 degrees without pain Normal IR to 15 degrees with pain  90 degree ER Normal ER to 30 degrees without pain Normal ER to 30 degrees with pain                             Special Tests   Right Left  SLR Negative Negative  FABER Normal + positive for groin pain  FADIR Negative + positive for groin pain    Tests Performed/Ordered:   None  Tests Previously Reviewed:  EXAM: Left Hip Radiographs - 3 views (AP pelvis, AP, Lateral) performed 07/16/2023   FINDINGS:   Mild-moderate hip arthritis change with central joint space narrowing, osteophyte formation.  Mild enthesopathic changes around the hip.  Mild SI joint arthritis change with osteophyte formation, subchondral sclerosis.  No fractures or dislocations.  No soft tissue swelling or joint effusion.  No loose bodies.  No abnormal bone lesions.  ------------------------------------------------------------------------------------------------------ EXAM: Left hip MRI without contrast performed 09/10/2023 at [facility]  IMPRESSION:  Mild degenerative change left hip.   Mild degenerative tear to the superior labrum.   Suspicion of a 5 mm loose body in the anterior joint space versus a slightly  atypical osteophyte.  ------------------------------------------------------------------------------------------------------- EXAM: Lumbar spine MRI without contrast performed 09/10/2023 at [facility]  IMPRESSION:  1. Progressive degenerative lumbar spondylosis with multilevel disc  disease and facet disease.  2. Progressive moderate spinal and bilateral lateral recess stenosis  at L2-3. There is also a broad-based right-sided disc osteophyte  complex contributing to extraforaminal encroachment on the right L2  nerve root. This appears stable to slightly progressive.  3. Mild bilateral lateral recess stenosis at L3-4 but no significant  spinal or foraminal stenosis.  4. Moderate spinal and bilateral lateral recess stenosis at L4-5.  There is also a broad-based left-sided disc osteophyte complex  contacting and displacing the left L4 nerve root. This is a  progressive finding and may account for the patient's left radicular  symptoms.   I personally reviewed and visualized the imaging studies if available.   Assessment:     ICD-10-CM  1. Pain of left hip joint  M25.552  2. Primary osteoarthritis of left hip  M16.12  3. Effusion of left hip  M25.452  4. Osteoarthritis of spine with radiculopathy, lumbar region  M47.26   Plan:   I have discussed the nature of her current subjective complaints, clinical examination, test results and have reviewed treatment options.  The plan is to do the following;  - The patient has chronic left groin pain possibly due to hip arthritis versus lumbar spine symptoms.  She has MRI showing some mild hip arthritis  degenerative changes.  She has lumbar spine MRI showing multilevel degenerative disc and facet changes leading to some nerve root impingement worse at L4-5. - She reports excellent symptom relief in her groin pain after hip joint aspiration and steroid injection on 09/03/2023 and not as much improvement in her symptoms from epidural steroid injections.  Her groin pains have recurred and have not improved with acetaminophen , gabapentin , prednisone , lumbar spine epidural steroid injection.  She has similar findings on physical exam compared to prior.  I reviewed her previous x-rays, MRIs with her.  I discussed the possible causes of her symptoms with her groin pain likely due  to her degenerative changes to the hip joint including superior acetabular bone edema.  I explained the treatment options with ambulatory aid and unloading the joint below the threshold of pain, home exercise, physical therapy, medication, steroid injection versus consideration for total hip arthroplasty.  She felt like she got excellent relief from steroid injection which would like to have this repeated.  See procedure note for full details. - Daily activity as tolerated. Modify as needed according to symptoms. No limitations to pain-free weight bearing.  No need for immobilization or bracing.  She can use a cane or walker as needed for ambulatory assistance. - Continue home exercise program to maintain strength, flexibility, and endurance. - Use chronic medication, Tylenol , topical diclofenac/pain cream/pain patch, relative rest, compression, massage, and ice/heat as needed for pain.   - Follow up as needed in 3-4 weeks depending on response to injection.  If she continues to have significant pain and functional issues, then recommended consultation for total hip arthroplasty.  She would have to wait 3 months from injection before consideration of surgery.  LEFT ultrasound guided intra-articular hip joint aspiration and injection    Consent After discussing the various treatment options for the condition,  It was agreed that an aspiration and corticosteroid injection would be the next step in treatment.  The nature of and the indications for a corticosteroid and / or local anaesthetic injection were reviewed in detail with the patient today.  The inherent risks of injection including infection, allergic reaction, increased pain, incomplete relief or temporary relief of symptoms, alterations of blood glucose levels requiring careful monitoring and treatment as indicated, tendon, ligament or articular cartilage rupture or degeneration, nerve injury, skin depigmentation, and/or fatty atrophy were discussed.     Indication for ultrasound Ultrasound-guided needle placement was indicated during this procedure due to the deep location of the target structure and its proximity to neurovascular structures. Inability to precisely localize the target using palpation or surface landmarks due to body habitus   Procedure After the risks and benefits of the procedure were explained, consent was given, and time-out was performed.  The left hip joint and surrounding structures were visualized with ultrasound.  There was a moderate-severe effusion with synovitis.  The site for the injection was properly marked and prepped with Chlorhexadine/Isopropyl alcohol solution.      The injection site was anesthetized with ethyl chloride and 3cc of 1% Lidocaine  using a 22 gauge 3.5 inch needle. Using ultrasound guidance, the hip joint was visualized and aspirated of 9mL of clear, yellow joint fluid with a 22 gauge 3.5 inch needle.  The syringe was changed and the hip was injected with 6 milligrams of Betamethasone , 2 milliliters of 0.25% Bupivacaine , and 2 milliliters of 1% Lidocaine  using a sterile technique and the same 22 gauge 3.5 inch needle. During injection, there was unrestricted flow and care was taken not to inject tissues.    A sterile band-aide  was applied.  Post-injection instructions were given regarding post-procedure care, when to follow up in clinic and what to expect from the procedure.  The patient tolerated the injection well and was discharged without complication.     Contact our office with any questions or concerns.  Follow up as indicated, or sooner should any new problems arise, if conditions worsen, or if they are otherwise concerned.   Prentice Reges, DO [facility] [address] [place], [place] [address] Phone: [phone]   This note was generated in part with  voice recognition software and I apologize for any typographical errors that were not detected and corrected.  Large Joint Injection: L hip joint  Date/Time: 12/04/2023 8:30 AM  Performed by: Sharrie Barter, DO Authorized by: Sharrie Barter, DO   Consent Given by:  Patient Timeout: timeout called immediately prior to procedure   Prep: patient was prepped and draped in usual sterile fashion   Indications:  Pain and joint swelling Needle Size:  22 G Guidance: ultrasound   Location:  Hip Site:  L hip joint Topical skin anesthesia: obtained using ethyl chloride spray   Medications:  2 mL BUPivacaine  HCl 0.25 %; 2 mL lidocaine  1 %; 3 mL lidocaine  1 %; 1 mL betamethasone  acetate-betamethasone  sodium phosphate  6 mg/mL Instructions: patient was counseled as to the expected post injection course, including the possibility of temporary worsening of symptoms   patient was instructed as to concerning symptoms or signs and instructed to contact the office if these should appear

## 2023-12-07 ENCOUNTER — Inpatient Hospital Stay

## 2023-12-07 ENCOUNTER — Ambulatory Visit: Payer: Self-pay | Admitting: Oncology

## 2023-12-07 ENCOUNTER — Inpatient Hospital Stay: Attending: Oncology | Admitting: Oncology

## 2023-12-07 ENCOUNTER — Encounter: Payer: Self-pay | Admitting: Oncology

## 2023-12-07 VITALS — BP 131/60 | HR 80 | Temp 98.1°F | Resp 18 | Wt 189.6 lb

## 2023-12-07 DIAGNOSIS — Z87891 Personal history of nicotine dependence: Secondary | ICD-10-CM | POA: Diagnosis not present

## 2023-12-07 DIAGNOSIS — Z8049 Family history of malignant neoplasm of other genital organs: Secondary | ICD-10-CM | POA: Diagnosis not present

## 2023-12-07 DIAGNOSIS — C569 Malignant neoplasm of unspecified ovary: Secondary | ICD-10-CM

## 2023-12-07 DIAGNOSIS — D72829 Elevated white blood cell count, unspecified: Secondary | ICD-10-CM | POA: Diagnosis not present

## 2023-12-07 DIAGNOSIS — Z800 Family history of malignant neoplasm of digestive organs: Secondary | ICD-10-CM | POA: Insufficient documentation

## 2023-12-07 DIAGNOSIS — Z803 Family history of malignant neoplasm of breast: Secondary | ICD-10-CM | POA: Insufficient documentation

## 2023-12-07 DIAGNOSIS — Z86711 Personal history of pulmonary embolism: Secondary | ICD-10-CM | POA: Insufficient documentation

## 2023-12-07 DIAGNOSIS — Z96652 Presence of left artificial knee joint: Secondary | ICD-10-CM

## 2023-12-07 DIAGNOSIS — Z8543 Personal history of malignant neoplasm of ovary: Secondary | ICD-10-CM | POA: Insufficient documentation

## 2023-12-07 DIAGNOSIS — Z95828 Presence of other vascular implants and grafts: Secondary | ICD-10-CM | POA: Insufficient documentation

## 2023-12-07 DIAGNOSIS — Z7901 Long term (current) use of anticoagulants: Secondary | ICD-10-CM | POA: Insufficient documentation

## 2023-12-07 LAB — CBC WITH DIFFERENTIAL (CANCER CENTER ONLY)
Abs Immature Granulocytes: 0.06 K/uL (ref 0.00–0.07)
Basophils Absolute: 0.2 K/uL — ABNORMAL HIGH (ref 0.0–0.1)
Basophils Relative: 1 %
Eosinophils Absolute: 0.3 K/uL (ref 0.0–0.5)
Eosinophils Relative: 3 %
HCT: 37.3 % (ref 36.0–46.0)
Hemoglobin: 12.7 g/dL (ref 12.0–15.0)
Immature Granulocytes: 1 %
Lymphocytes Relative: 39 %
Lymphs Abs: 4.4 K/uL — ABNORMAL HIGH (ref 0.7–4.0)
MCH: 30.4 pg (ref 26.0–34.0)
MCHC: 34 g/dL (ref 30.0–36.0)
MCV: 89.2 fL (ref 80.0–100.0)
Monocytes Absolute: 1.1 K/uL — ABNORMAL HIGH (ref 0.1–1.0)
Monocytes Relative: 10 %
Neutro Abs: 5.3 K/uL (ref 1.7–7.7)
Neutrophils Relative %: 46 %
Platelet Count: 410 K/uL — ABNORMAL HIGH (ref 150–400)
RBC: 4.18 MIL/uL (ref 3.87–5.11)
RDW: 13.4 % (ref 11.5–15.5)
WBC Count: 11.3 K/uL — ABNORMAL HIGH (ref 4.0–10.5)
nRBC: 0 % (ref 0.0–0.2)

## 2023-12-07 LAB — CMP (CANCER CENTER ONLY)
ALT: 12 U/L (ref 0–44)
AST: 19 U/L (ref 15–41)
Albumin: 3.9 g/dL (ref 3.5–5.0)
Alkaline Phosphatase: 68 U/L (ref 38–126)
Anion gap: 13 (ref 5–15)
BUN: 27 mg/dL — ABNORMAL HIGH (ref 8–23)
CO2: 25 mmol/L (ref 22–32)
Calcium: 8.4 mg/dL — ABNORMAL LOW (ref 8.9–10.3)
Chloride: 94 mmol/L — ABNORMAL LOW (ref 98–111)
Creatinine: 1.01 mg/dL — ABNORMAL HIGH (ref 0.44–1.00)
GFR, Estimated: 57 mL/min — ABNORMAL LOW (ref >60)
Glucose, Bld: 167 mg/dL — ABNORMAL HIGH (ref 70–99)
Potassium: 3.4 mmol/L — ABNORMAL LOW (ref 3.5–5.1)
Sodium: 132 mmol/L — ABNORMAL LOW (ref 135–145)
Total Bilirubin: 0.5 mg/dL (ref 0.0–1.2)
Total Protein: 7.3 g/dL (ref 6.5–8.1)

## 2023-12-07 NOTE — Assessment & Plan Note (Signed)
 Patient is doing well clinically. Last CT scan was in March 2025-no recurrence CA 125 has been followed with stability in 20s-30s.  Recommend to continue monitoring tumor markers Q3 months. Discontinue routine scheduled CT scans at this time, and will obtain imaging only if new lab or symptom concern arises

## 2023-12-07 NOTE — Progress Notes (Signed)
 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   ASSESSMENT & PLAN:   History of ovarian cancer Patient is doing well clinically. Last CT scan was in March 2025-no recurrence CA 125 has been followed with stability in 20s-30s.  Recommend to continue monitoring tumor markers Q3 months. Discontinue routine scheduled CT scans at this time, and will obtain imaging only if new lab or symptom concern arises   History of pulmonary embolism Patient takes Xarelto  20 mg daily.  Also has atrial fibrillation.  Xarelto  is managed by cardiology  Port-A-Cath in place Patient has Mediport due to lack of IV access. Plan to flush port every 6-8 weeks if not using.  Leukocytosis Likely reactive secondary to recent steroid injections.  Monitor.  Orders Placed This Encounter  Procedures   CBC with Differential (Cancer Center Only)    Standing Status:   Future    Expected Date:   06/05/2024    Expiration Date:   09/03/2024   CMP (Cancer Center only)    Standing Status:   Future    Expected Date:   06/05/2024    Expiration Date:   09/03/2024   CA 125    Standing Status:   Future    Expected Date:   06/05/2024    Expiration Date:   09/03/2024   CA 125    Standing Status:   Future    Expected Date:   03/08/2024    Expiration Date:   06/06/2024   CBC with Differential (Cancer Center Only)    Standing Status:   Future    Number of Occurrences:   1    Expected Date:   12/07/2023    Expiration Date:   03/06/2024   CMP (Cancer Center only)    Standing Status:   Future    Number of Occurrences:   1     Expected Date:   12/07/2023    Expiration Date:   03/06/2024   CA 125    Standing Status:   Future    Number of Occurrences:   1    Expected Date:   12/07/2023    Expiration Date:   03/06/2024   Follow-up per LOS All questions were answered. The patient knows to Wu the clinic with any problems, questions or concerns.  Wu Babara, MD, PhD [name] Hematology Oncology 12/07/2023    HISTORY OF PRESENTING ILLNESS:   Kayla Wu is a  78 y.o.  female with PMH listed below who presents to reestablish care for history of platinum sensitive endometrioid adenocarcinoma of ovary, history of pulmonary embolism. Patient was previously seen by me in 2022.  She transferred her care to [facility] and present to reestablish care due to insurance no longer covers [facility].  May 2022,  hospitalized at [facility] for shortness of breath, productive cough, generalized weakness and body aches.  She was tested positive for COVID-19.  Patient developed acute hypoxic respiratory failure, received remdesivir , IV steroid.  She also developed paroxysmal atrial fibrillation with RVR.  Patient was started on Eliquis  due to CHA2DS2-VASc score of 6.  09/23/2020 - 09/27/2020, patient was hospitalized at [facility] due to 5 days of nausea,  worsening abdominal pain and constipation. CT abdomen pelvis showed dilated loops of small bowel proximal to the right lower quadrant anastomosis.  Concerning for early/partial small bowel obstruction vs constipation.  Patient was started on bowel regimen and had a large bowel movement during her admission.  Nausea was controlled with antiemetics. Patient has been well Eliquis  5 mg twice daily since May 2022 discharge.  During admission patient developed shortness of breath, requiring 2 L of oxygen .  Chest x-ray reviewed no signs of pulm effusion, pneumothorax or consolidation. VQ scan showed high probability of PE.  CT chest noncontrast reviewed multifocal scarring but no evidence of interstitial disease.   Echocardiogram showed reviewed normal LV, RV size and function.  Mild mitral valve calcification and insufficient TR signal to evaluate pulmonary hypertension.  Patient was seen by hematologist during her hospitalization.  She was recommended to stop Eliquis  and started on Lovenox  100 mg twice daily for the remainder of her hospitalization stay at switch to Xarelto  15 mg twice daily for 21 days followed by Xarelto  20 mg daily thereafter for the treatment of PE.  Lower extremity ultrasound showed no evidence of DVT, possible obstruction proximal to the inguinal ligament of the left lower extremity.  Patient was seen recently by pulmonology.  Dr. Aleskerov.  Patient continues to have chronic dyspnea, improved since her discharge, she has mentioned about high cost of Xarelto  and patient was referred to establish care for further evaluation and possibility of switching to Coumadin with weekly INR check.  Patient was accompanied by her husband today.  Reports that her chronic shortness of breath has improved since discharge.   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 Initially presented with stage Ic grade 3 endometrial ovarian cancer in 2012.  She had BSO, surgery TLH/omentectomy. 6 cycles of carboplatin  and Taxol . 2016, had recurrent disease with CA125 increased to 50.6. 02/09/2015, status post Surgery Infragastric omentectomy, supralevator posterior exenteration, iintraperitoneal rectosigmoid resection, mobilization splenic flexure, tumor debulking from anterior abdominal wall, bladder peritoneum, rectosigmoid. Ablation tumor from liver surface  January 2021 developed liver recurrence confirmed by liver biopsy. 02/28/2019 - 04/11/2019  Status post retreatment with carboplatin  and Taxol . Status post [employer].   Patient has been follow-up with [facility] oncology and had lab work done every 3 months monitoring tumor marker.  Last CT scan was in March  2025.  Today patient reports feeling well.  She is currently on Xarelto  20 mg daily, managed by cardiology Dr. Gollan.  She has a history of atrial fibrillation. Patient has a Mediport due to lack of IV access.  She denies any fevers or chills, or unintentional weight loss. Denies any abdominal pain or early satiety, or nausea or vomiting. Denies any vaginal bleeding or abnormal vaginal discharge.   Patient has left sided lower back pain as well as left hip pain.  She had MRI done in August 2025 for further evaluation.  MRI showed degenerative disease.   S/p bilateral L3-4 epidural steroid injection on 10/20/2023 and bilateral L4-5 epidural steroid injections on 11/19/2023.   Review of Systems  Constitutional:  Negative for appetite change, chills, fatigue and fever.  HENT:   Negative for hearing loss and voice change.   Eyes:  Negative for eye problems.  Respiratory:  Negative for chest tightness and cough.   Cardiovascular:  Negative for chest pain.  Gastrointestinal:  Negative for abdominal distention, abdominal pain and blood in stool.  Endocrine: Negative for hot flashes.  Genitourinary:  Negative for difficulty urinating and frequency.   Musculoskeletal:  Positive for arthralgias.       Left hip pain  Skin:  Negative for itching and rash.  Neurological:  Negative for extremity weakness.  Hematological:  Negative for adenopathy.  Psychiatric/Behavioral:  Negative for confusion.     MEDICAL HISTORY:  Past Medical History:  Diagnosis Date   Anxiety    a.) on BZO PRN (lorazepam )   Aortic atherosclerosis    CAD (coronary artery disease) - non-obstructive    a.) MV 05/23/2014: EF 75%, no ischemia/artifact; b.) cCTA 09/16/2021: Ca2+ 340 (79th %ile). LM nl, LAD 25-40p, LCX dominant, <25p, RCA nondom, nl.   Chronic hypoxemic respiratory failure (HCC)    Chronic urticaria    COPD (chronic obstructive pulmonary disease) (HCC)    DDD (degenerative disc disease), lumbar    Depression     Diastolic dysfunction    a.) TTE 05/23/2014: EF >55%, no RWMAs, G1DD, triv AR, mild MR, RVSP 37.1; b.) TTE 01/19/2018: EF >55%, mild LVH, no RWMAs, G1DD, mild LAE, triv AR/MR/PR, mild TR, RVSP 36.1; c.) TTE 06/29/2020: EF 60-65%, no RWMAs, G1DD, RVSP 14.9   GERD (gastroesophageal reflux disease)    Hepatic steatosis    History of 2019 novel coronavirus disease (COVID-19) 06/25/2020   HLD (hyperlipidemia)    Hypertension    Insomnia    a.) on hypnotic (zolpidem )   Menopausal state    Morbid obesity (HCC)    Non-cardiac chest pain    On rivaroxaban  therapy    Osteoarthritis    Ovarian cancer (HCC) 2012   a.) s/p Tx with systemic chemotherapy   Oxygen  desaturation during sleep    a.) on 1-2 L/[place] supplemental oxygen  while sleeping   PAF (paroxysmal atrial fibrillation) (HCC) 06/2020   a. Dx'd in setting of SARS-CoV-2 infection; b. CHA2DS2-VASc = 8 (age x2, sex, HTN, VTE x2, vascular disease history, T2DM) -->Xarelto .   Pneumonia 1987   Rectovaginal fistula    Sleep apnea    a.) does not utilize nocturnal PAP therapy   T2DM (type 2 diabetes mellitus) (HCC)    VTE (venous thromboembolism) 06/2020   a.) developed in setting of SARS-CoV-2 infection and A.fib with RVR->s/p IVC filter 12/2022 w/ retrieval 02/2023 (surrounding knee surgery).    SURGICAL HISTORY: Past Surgical History:  Procedure Laterality Date   ABDOMINAL HYSTERECTOMY     APPENDECTOMY     CHOLECYSTECTOMY     ESOPHAGOGASTRODUODENOSCOPY (EGD) WITH PROPOFOL  N/A 06/28/2021   Procedure: ESOPHAGOGASTRODUODENOSCOPY (EGD) WITH PROPOFOL ;  Surgeon: Maryruth Ole DASEN, MD;  Location: [facility];  Service: Endoscopy;  Laterality: N/A;  DM, ON WARFARIN   FINGER ARTHROPLASTY Left 12/22/2014   Procedure: FINGER ARTHROPLASTY;  Surgeon: Lonni Sharps, MD;  Location: [facility] OR[facility];  Service: Orthopedics;  Laterality: Left;   IVC FILTER INSERTION N/A 12/16/2022   Procedure: IVC FILTER INSERTION;  Surgeon: Jama Cordella MATSU, MD;   Location: [facility];  Service: Cardiovascular;  Laterality: N/A;   IVC FILTER REMOVAL N/A 02/24/2023   Procedure: IVC FILTER REMOVAL;  Surgeon: Jama Cordella MATSU, MD;  Location: [facility];  Service: Cardiovascular;  Laterality: N/A;   KNEE ARTHROPLASTY Left 12/22/2022   Procedure: COMPUTER ASSISTED TOTAL KNEE ARTHROPLASTY;  Surgeon: Mardee Lynwood SQUIBB, MD;  Location: [facility];  Service: Orthopedics;  Laterality: Left;   KNEE ARTHROSCOPY WITH SUBCHONDROPLASTY Left 06/27/2022   Procedure: Left knee arthroscopic chondroplasty, possible partial meniscectomy, and subchondroplasty to the medial femoral condyle;  Surgeon: Tobie Priest, MD;  Location: [facility];  Service: Orthopedics;  Laterality: Left;  Diabetic    SOCIAL HISTORY: Social History   Socioeconomic History   Marital status: Married    Spouse name: Not on file   Number of children: 3   Years of education: Not on file   Highest education level: Not on file  Occupational History   Occupation: Retired  Tobacco Use   Smoking status: Former    Current packs/day: 0.00    Average packs/day: 2.0 packs/day for 42.0 years (84.0 ttl pk-yrs)    Types: Cigarettes    Start date: 07/04/1965    Quit date: 07/05/2007    Years since quitting: 16.4   Smokeless tobacco: Never  Vaping Use   Vaping status: Never Used  Substance and Sexual Activity   Alcohol use: No    Comment: occasional glass of wine once a month   Drug use: No   Sexual activity: Not on file  Other Topics Concern   Not on file  Social History Narrative   Lives locally w/ husband.  Does not routinely exercise.   Social Drivers of Corporate Investment Banker Strain: Low Risk  (12/07/2023)   Overall Financial Resource Strain (CARDIA)    Difficulty of Paying Living Expenses: Not very hard  Food Insecurity: No Food Insecurity (12/07/2023)   Hunger Vital Sign    Worried About Running Out of Food in the Last Year: Never true    Ran Out of Food in the  Last Year: Never true  Transportation Needs: No Transportation Needs (12/07/2023)   PRAPARE - Administrator, Civil Service (Medical): No    Lack of Transportation (Non-Medical): No  Physical Activity: Not on file  Stress: No Stress Concern Present (12/07/2023)   [employer] of Occupational Health - Occupational Stress Questionnaire    Feeling of Stress: Not at all  Social Connections: Not on file  Intimate Partner Violence: Not At Risk (12/07/2023)   Humiliation, Afraid, Rape, and Kick questionnaire    Fear of Current or Ex-Partner: No    Emotionally Abused: No    Physically Abused: No    Sexually Abused: No    FAMILY HISTORY: Family History  Problem Relation Age of Onset   Breast cancer Mother 32   Emphysema Father        smoked   Colon cancer Sister    Uterine cancer Sister    Throat cancer Brother     ALLERGIES:  is allergic to iodinated contrast media and iohexol .  MEDICATIONS:  Current Outpatient Medications  Medication Sig Dispense Refill   acetaminophen  (TYLENOL ) 650 MG CR tablet Take 650 mg by mouth every 8 (eight) hours as needed.     albuterol  (VENTOLIN  HFA) 108 (90 Base) MCG/ACT inhaler Inhale 1-2 puffs into the lungs every 4 (four) hours as needed.     bisacodyl  (DULCOLAX) 5 MG EC tablet Take 5 mg by mouth as needed for moderate constipation.     bisoprolol  (ZEBETA ) 5 MG tablet TAKE 1 TABLET EVERY DAY 90 tablet 2   Camphor-Eucalyptus-Menthol  ([employer] MEDICATED CHEST RUB EX) Apply 1 Application topically as needed. Bottom of feet     chlorthalidone (HYGROTON) 25 MG tablet Take 1 tablet by mouth daily.     diltiazem  (CARDIZEM  CD) 180 MG 24 hr capsule TAKE 1 CAPSULE EVERY DAY 30 capsule 0   Emollient (LUBRIDERM) LOTN Apply 1 Application topically daily as needed.     ferrous sulfate  325 (65 FE) MG EC tablet  Take 325 mg by mouth 3 (three) times daily with meals.     gabapentin  (NEURONTIN ) 600 MG tablet Take 600 mg by mouth 2 (two) times daily.      HYDROcodone -acetaminophen  (NORCO) 10-325 MG tablet Take 1 tablet by mouth 2 (two) times daily as needed for moderate pain (pain score 4-6).     hydrOXYzine  (ATARAX ) 50 MG tablet Take 50 mg by mouth at bedtime as needed (sleep).     insulin  NPH-regular Human (70-30) 100 UNIT/ML injection Inject 25 Units into the skin daily as needed (blood sugar of 130 or higher).     LORazepam  (ATIVAN ) 1 MG tablet Take 1 mg by mouth daily.     losartan  (COZAAR ) 100 MG tablet Take 100 mg by mouth daily.     magnesium  oxide (MAG-OX) 400 MG tablet Take 400 mg by mouth in the morning, at noon, in the evening, and at bedtime.     metFORMIN  (GLUCOPHAGE -XR) 500 MG 24 hr tablet Take 1,500 mg by mouth daily with supper.     omeprazole (PRILOSEC) 40 MG capsule Take 40 mg by mouth daily.     oxyCODONE  (OXY IR/ROXICODONE ) 5 MG immediate release tablet Take 1 tablet (5 mg total) by mouth every 4 (four) hours as needed for moderate pain (pain score 4-6) (pain score 4-6). 30 tablet 0   Polyethylene Glycol 3350 (MIRALAX PO) Take 17 g by mouth as needed.     rosuvastatin  (CRESTOR ) 10 MG tablet Take 10 mg by mouth daily.     Semaglutide, 2 MG/DOSE, (OZEMPIC, 2 MG/DOSE,) 8 MG/3ML SOPN Inject 2 mg into the skin once a week.     senna (SENOKOT) 8.6 MG TABS tablet Take 3 tablets by mouth daily.     Spacer/Aero-Holding Chambers (EASIVENT) inhaler Use as instructed with albuterol  inhaler     traMADol  (ULTRAM ) 50 MG tablet Take 1-2 tablets (50-100 mg total) by mouth every 4 (four) hours as needed for moderate pain (pain score 4-6). 30 tablet 0   venlafaxine  XR (EFFEXOR -XR) 150 MG 24 hr capsule Take 150 mg by mouth daily. (take with 37.5mg  capsule to equal 187.5mg  total)     venlafaxine  XR (EFFEXOR -XR) 37.5 MG 24 hr capsule Take 37.5 mg by mouth daily. (take with 150mg  capsule to equal 187.5mg  total)     vitamin B-12 (CYANOCOBALAMIN ) 1000 MCG tablet Take 1,000 mcg by mouth daily.     Vitamin D , Ergocalciferol , (DRISDOL) 1.25 MG (50000 UNIT)  CAPS capsule Take 50,000 Units by mouth once a week.     XARELTO  20 MG TABS tablet TAKE 1 TABLET EVERY DAY WITH SUPPER 90 tablet 3   zolpidem  (AMBIEN ) 10 MG tablet Take 10 mg by mouth at bedtime as needed for sleep.     celecoxib  (CELEBREX ) 200 MG capsule Take 1 capsule (200 mg total) by mouth 2 (two) times daily. (Patient not taking: Reported on 12/07/2023) 60 capsule 1   No current facility-administered medications for this visit.     PHYSICAL EXAMINATION: ECOG PERFORMANCE STATUS: 1 - Symptomatic but completely ambulatory Vitals:   12/07/23 1119 12/07/23 1132  BP: (!) 151/58 131/60  Pulse: 80   Resp: 18   Temp: 98.1 F (36.7 C)   SpO2: 91%    Filed Weights   12/07/23 1119  Weight: 189 lb 9.6 oz (86 kg)    Physical Exam Constitutional:      General: She is not in acute distress.    Appearance: She is obese.  HENT:  Head: Normocephalic and atraumatic.  Eyes:     General: No scleral icterus. Cardiovascular:     Rate and Rhythm: Normal rate and regular rhythm.     Heart sounds: Normal heart sounds.  Pulmonary:     Effort: Pulmonary effort is normal. No respiratory distress.     Breath sounds: No wheezing.  Abdominal:     General: Bowel sounds are normal. There is no distension.     Palpations: Abdomen is soft.  Musculoskeletal:        General: No deformity. Normal range of motion.     Cervical back: Normal range of motion and neck supple.  Skin:    General: Skin is warm and dry.     Findings: No erythema or rash.  Neurological:     Mental Status: She is alert and oriented to person, place, and time. Mental status is at baseline.     Cranial Nerves: No cranial nerve deficit.     Coordination: Coordination normal.  Psychiatric:        Mood and Affect: Mood normal.     LABORATORY DATA:  I have reviewed the data as listed Lab Results  Component Value Date   WBC 11.3 (H) 12/07/2023   HGB 12.7 12/07/2023   HCT 37.3 12/07/2023   MCV 89.2 12/07/2023   PLT 410  (H) 12/07/2023   Recent Labs    12/03/23 1603 12/07/23 1218  NA 139 132*  K 3.7 3.4*  CL 96 94*  CO2 22 25  GLUCOSE 148* 167*  BUN 38* 27*  CREATININE 1.15* 1.01*  CALCIUM  9.3 8.4*  GFRNONAA  --  57*  PROT 7.0 7.3  ALBUMIN  4.6 3.9  AST 11 19  ALT 9 12  ALKPHOS 92 68  BILITOT 0.2 0.5   Iron/TIBC/Ferritin/ %Sat    Component Value Date/Time   FERRITIN 123 06/30/2020 0534      RADIOGRAPHIC STUDIES: I have personally reviewed the radiological images as listed and agreed with the findings in the report. No results found.

## 2023-12-07 NOTE — Assessment & Plan Note (Signed)
 Patient takes Xarelto  20 mg daily.  Also has atrial fibrillation.  Xarelto  is managed by cardiology

## 2023-12-07 NOTE — Assessment & Plan Note (Signed)
 Patient has Mediport due to lack of IV access. Plan to flush port every 6-8 weeks if not using.

## 2023-12-07 NOTE — Assessment & Plan Note (Signed)
 Likely reactive secondary to recent steroid injections.  Monitor.

## 2023-12-08 LAB — CA 125: Cancer Antigen (CA) 125: 38.6 U/mL — ABNORMAL HIGH (ref 0.0–38.1)

## 2023-12-11 ENCOUNTER — Other Ambulatory Visit: Payer: Self-pay

## 2023-12-11 DIAGNOSIS — C569 Malignant neoplasm of unspecified ovary: Secondary | ICD-10-CM

## 2023-12-11 DIAGNOSIS — R978 Other abnormal tumor markers: Secondary | ICD-10-CM

## 2023-12-11 DIAGNOSIS — Z8543 Personal history of malignant neoplasm of ovary: Secondary | ICD-10-CM

## 2023-12-15 ENCOUNTER — Telehealth: Payer: Self-pay | Admitting: *Deleted

## 2023-12-15 DIAGNOSIS — M5416 Radiculopathy, lumbar region: Secondary | ICD-10-CM | POA: Diagnosis not present

## 2023-12-15 DIAGNOSIS — M48062 Spinal stenosis, lumbar region with neurogenic claudication: Secondary | ICD-10-CM | POA: Diagnosis not present

## 2023-12-15 NOTE — Telephone Encounter (Signed)
 Heather, I wasn't sure who called so left VM was left on pt's number.   LM letting her know that Dr. Babara recommended CT scan for further evaluation due to elevated tumor marker. She will review CT results and if she needs to see pt sooner we will contact her.  For now, Keep appt's in May as scheduled.

## 2023-12-15 NOTE — Telephone Encounter (Signed)
 Msg left on clinic line 12/14/23 at 1045. Stating that someone from clinic had called pt to arrange for another apt with Dr. Babara. Family Requesting a return phone call.

## 2023-12-18 ENCOUNTER — Ambulatory Visit
Admission: RE | Admit: 2023-12-18 | Discharge: 2023-12-18 | Disposition: A | Discharge status: Home / Self Care | Source: Ambulatory Visit | Attending: Oncology | Admitting: Oncology

## 2023-12-18 DIAGNOSIS — R978 Other abnormal tumor markers: Secondary | ICD-10-CM | POA: Insufficient documentation

## 2023-12-18 DIAGNOSIS — K429 Umbilical hernia without obstruction or gangrene: Secondary | ICD-10-CM | POA: Diagnosis not present

## 2023-12-18 DIAGNOSIS — K439 Ventral hernia without obstruction or gangrene: Secondary | ICD-10-CM | POA: Diagnosis not present

## 2023-12-18 DIAGNOSIS — Z8543 Personal history of malignant neoplasm of ovary: Secondary | ICD-10-CM | POA: Insufficient documentation

## 2023-12-18 DIAGNOSIS — K5730 Diverticulosis of large intestine without perforation or abscess without bleeding: Secondary | ICD-10-CM | POA: Diagnosis not present

## 2023-12-21 ENCOUNTER — Ambulatory Visit: Payer: Self-pay | Admitting: Oncology

## 2023-12-22 DIAGNOSIS — I10 Essential (primary) hypertension: Secondary | ICD-10-CM | POA: Diagnosis not present

## 2023-12-22 DIAGNOSIS — E1159 Type 2 diabetes mellitus with other circulatory complications: Secondary | ICD-10-CM | POA: Diagnosis not present

## 2023-12-22 DIAGNOSIS — G8929 Other chronic pain: Secondary | ICD-10-CM | POA: Diagnosis not present

## 2023-12-22 DIAGNOSIS — M4807 Spinal stenosis, lumbosacral region: Secondary | ICD-10-CM | POA: Diagnosis not present

## 2023-12-22 DIAGNOSIS — Z79899 Other long term (current) drug therapy: Secondary | ICD-10-CM | POA: Diagnosis not present

## 2023-12-22 DIAGNOSIS — Z96652 Presence of left artificial knee joint: Secondary | ICD-10-CM | POA: Diagnosis not present

## 2023-12-22 DIAGNOSIS — E7800 Pure hypercholesterolemia, unspecified: Secondary | ICD-10-CM | POA: Diagnosis not present

## 2023-12-22 DIAGNOSIS — M25552 Pain in left hip: Secondary | ICD-10-CM | POA: Diagnosis not present

## 2024-01-01 ENCOUNTER — Other Ambulatory Visit: Payer: Self-pay | Admitting: Cardiovascular Disease

## 2024-01-06 ENCOUNTER — Inpatient Hospital Stay

## 2024-01-06 ENCOUNTER — Inpatient Hospital Stay: Attending: Oncology | Admitting: Obstetrics and Gynecology

## 2024-01-06 VITALS — BP 122/64 | HR 82 | Temp 97.7°F | Ht 64.0 in | Wt 188.0 lb

## 2024-01-06 DIAGNOSIS — I10 Essential (primary) hypertension: Secondary | ICD-10-CM | POA: Insufficient documentation

## 2024-01-06 DIAGNOSIS — Z90710 Acquired absence of both cervix and uterus: Secondary | ICD-10-CM | POA: Insufficient documentation

## 2024-01-06 DIAGNOSIS — M1990 Unspecified osteoarthritis, unspecified site: Secondary | ICD-10-CM | POA: Insufficient documentation

## 2024-01-06 DIAGNOSIS — E7800 Pure hypercholesterolemia, unspecified: Secondary | ICD-10-CM | POA: Diagnosis not present

## 2024-01-06 DIAGNOSIS — Z9221 Personal history of antineoplastic chemotherapy: Secondary | ICD-10-CM | POA: Diagnosis not present

## 2024-01-06 DIAGNOSIS — Z79899 Other long term (current) drug therapy: Secondary | ICD-10-CM | POA: Insufficient documentation

## 2024-01-06 DIAGNOSIS — I4891 Unspecified atrial fibrillation: Secondary | ICD-10-CM | POA: Insufficient documentation

## 2024-01-06 DIAGNOSIS — F419 Anxiety disorder, unspecified: Secondary | ICD-10-CM | POA: Insufficient documentation

## 2024-01-06 DIAGNOSIS — Z86711 Personal history of pulmonary embolism: Secondary | ICD-10-CM | POA: Insufficient documentation

## 2024-01-06 DIAGNOSIS — Z7984 Long term (current) use of oral hypoglycemic drugs: Secondary | ICD-10-CM | POA: Insufficient documentation

## 2024-01-06 DIAGNOSIS — J431 Panlobular emphysema: Secondary | ICD-10-CM | POA: Diagnosis not present

## 2024-01-06 DIAGNOSIS — I2510 Atherosclerotic heart disease of native coronary artery without angina pectoris: Secondary | ICD-10-CM | POA: Insufficient documentation

## 2024-01-06 DIAGNOSIS — Z87891 Personal history of nicotine dependence: Secondary | ICD-10-CM | POA: Insufficient documentation

## 2024-01-06 DIAGNOSIS — E119 Type 2 diabetes mellitus without complications: Secondary | ICD-10-CM | POA: Insufficient documentation

## 2024-01-06 DIAGNOSIS — K219 Gastro-esophageal reflux disease without esophagitis: Secondary | ICD-10-CM | POA: Diagnosis not present

## 2024-01-06 DIAGNOSIS — Z08 Encounter for follow-up examination after completed treatment for malignant neoplasm: Secondary | ICD-10-CM | POA: Diagnosis not present

## 2024-01-06 DIAGNOSIS — Z90722 Acquired absence of ovaries, bilateral: Secondary | ICD-10-CM | POA: Insufficient documentation

## 2024-01-06 DIAGNOSIS — Z9079 Acquired absence of other genital organ(s): Secondary | ICD-10-CM | POA: Diagnosis not present

## 2024-01-06 DIAGNOSIS — Z8543 Personal history of malignant neoplasm of ovary: Secondary | ICD-10-CM | POA: Diagnosis not present

## 2024-01-06 NOTE — Progress Notes (Signed)
 Gynecologic Oncology Consult Visit   Referring Provider: Dr Sonny Gaskins  Chief Concern: History of endometrioid ovarian cancer, establish care  Subjective:  Kayla Wu is a  78 y.o.  female with PMH listed below who presents to establish care for history of platinum sensitive endometrioid adenocarcinoma of ovary, history of pulmonary embolism. Patient was previously seen by Dr Babara in 2022.  She transferred her care to [facility] and presents to establish care due to insurance no longer covers [facility].   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 Initially presented with stage Ic grade 3 endometrial ovarian cancer in 2012.  She had BSO, surgery TLH/omentectomy. 6 cycles of carboplatin  and Taxol . 2016, had recurrent disease with CA125 increased to 50.6. 02/09/2015  Infragastric omentectomy, supralevator posterior exenteration, iintraperitoneal rectosigmoid resection, mobilization splenic flexure, tumor debulking from anterior abdominal wall, bladder peritoneum, rectosigmoid. Ablation tumor from liver surface  January 2021 developed liver recurrence confirmed by liver biopsy. 02/28/2019 - 04/11/2019  Retreatment with carboplatin  and Taxol . Status post CyberKnife.    May 2022,  hospitalized at [facility] for shortness of breath, productive cough, generalized weakness and body aches.  She was tested positive for COVID-19.  Patient developed acute hypoxic respiratory failure, received remdesivir , IV steroid.  She also developed paroxysmal atrial fibrillation with RVR.  Patient was started on Eliquis  due to CHA2DS2-VASc score of 6.   09/23/2020 - 09/27/2020, patient was hospitalized at [facility] due to 5 days of nausea, worsening abdominal pain and constipation. CT abdomen pelvis showed dilated loops of small bowel proximal to the right lower quadrant anastomosis.  Concerning for early/partial small bowel obstruction vs constipation.  Patient was started on bowel regimen  and had a large bowel movement during her admission.   Patient has been well Eliquis  5 mg twice daily since May 2022 discharge.  During admission patient developed shortness of breath, requiring 2 L of oxygen .  Chest x-ray reviewed no signs of pulm effusion, pneumothorax or consolidation. VQ scan showed high probability of PE.  CT chest noncontrast reviewed multifocal scarring but no evidence of interstitial disease.  Echocardiogram showed reviewed normal LV, RV size and function.  Mild mitral valve calcification and insufficient TR signal to evaluate pulmonary hypertension.  Patient was seen by hematologist during her hospitalization.  She was recommended to stop Eliquis  and started on Lovenox  100 mg twice daily for the remainder of her hospitalization stay at switch to Xarelto  15 mg twice daily for 21 days followed by Xarelto  20 mg daily thereafter for the treatment of PE.  Lower extremity ultrasound showed no evidence of DVT, possible obstruction proximal to the inguinal ligament of the left lower extremity.   Patient was seen recently by pulmonology.  Dr. Aleskerov.  Patient continues to have chronic dyspnea, improved since her discharge, she has mentioned about high cost of Xarelto  and patient was referred to establish care for further evaluation and possibility of switching to Coumadin with weekly INR check.  Patient has been follow-up with [facility] oncology and had lab work done every 3 months monitoring tumor marker.  Last CT scan was in March 2025.   Patient was accompanied by her husband today.  Reports that her chronic shortness of breath has improved since discharge.  Today patient reports feeling well.  She is currently on Xarelto  20 mg daily, managed by cardiology Dr. Gollan.  She has a history of atrial fibrillation. Patient has a Mediport due to lack of IV access.   She  denies any fevers or chills, or unintentional weight loss. Denies any abdominal pain or early satiety, or nausea or  vomiting. Denies any vaginal bleeding or abnormal vaginal discharge.    Patient has left sided lower back pain as well as left hip pain.  She had MRI done in August 2025 for further evaluation.  MRI showed degenerative disease.   S/p bilateral L3-4 epidural steroid injection on 10/20/2023 and bilateral L4-5 epidural steroid injections on 11/19/2023.   Patient and three daughters have had negative germline genetic testing.   Problem List: Patient Active Problem List   Diagnosis Date Noted   History of pulmonary embolism 12/07/2023   Port-A-Cath in place 12/07/2023   Leukocytosis 12/07/2023   Aortic atherosclerosis 12/22/2022   Venous obstruction 11/26/2022   DJD (degenerative joint disease) 11/26/2022   Osteoarthritis of carpometacarpal (CMC) joint of thumb 11/02/2022   Primary osteoarthritis of left knee 11/02/2022   Elevated INR 04/22/2021   Pulmonary embolism (HCC) 09/25/2020   Constipation 09/23/2020   Atrial fibrillation with RVR (HCC) 06/29/2020   Acute hypoxemic respiratory failure due to COVID-19 (HCC) 06/25/2020   Sepsis due to urinary tract infection (HCC) 06/25/2020   COPD with acute exacerbation (HCC) 01/19/2020   Diabetes mellitus (HCC)    Hypomagnesemia 02/25/2019   Localized, primary osteoarthritis of hand 01/19/2018   Chronic anemia 01/04/2018   Tremor 03/20/2016   Low serum cortisol level 07/24/2015   Encounter for antineoplastic chemotherapy 03/16/2015   Fistula of vagina to large intestine 02/26/2015   Rectal vaginal fistula 02/26/2015   Type II diabetes mellitus with complication (HCC) 10/11/2014   Dyspnea on exertion 07/19/2014   Chronic obstructive pulmonary disease (HCC) 04/20/2013   Narrowing of intervertebral disc space 04/20/2013   Benign essential HTN 04/20/2013   HLD (hyperlipidemia) 04/20/2013   Benign hypertension 04/20/2013   Panlobular emphysema (HCC) 04/20/2013   Pure hypercholesterolemia 04/20/2013   Multilevel degenerative disc disease  04/20/2013   COPD pft's pending 07/08/2012   Chronic obstructive pulmonary disease with acute exacerbation (HCC) 07/08/2012   Disorder of magnesium  metabolism 01/09/2011   GERD (gastroesophageal reflux disease) 01/09/2011   History of ovarian cancer 12/16/2010    Past Medical History: Past Medical History:  Diagnosis Date   Anxiety    a.) on BZO PRN (lorazepam )   Aortic atherosclerosis    CAD (coronary artery disease) - non-obstructive    a.) MV 05/23/2014: EF 75%, no ischemia/artifact; b.) cCTA 09/16/2021: Ca2+ 340 (79th %ile). LM nl, LAD 25-40p, LCX dominant, <25p, RCA nondom, nl.   Chronic hypoxemic respiratory failure (HCC)    Chronic urticaria    COPD (chronic obstructive pulmonary disease) (HCC)    DDD (degenerative disc disease), lumbar    Depression    Diastolic dysfunction    a.) TTE 05/23/2014: EF >55%, no RWMAs, G1DD, triv AR, mild MR, RVSP 37.1; b.) TTE 01/19/2018: EF >55%, mild LVH, no RWMAs, G1DD, mild LAE, triv AR/MR/PR, mild TR, RVSP 36.1; c.) TTE 06/29/2020: EF 60-65%, no RWMAs, G1DD, RVSP 14.9   GERD (gastroesophageal reflux disease)    Hepatic steatosis    History of 2019 novel coronavirus disease (COVID-19) 06/25/2020   HLD (hyperlipidemia)    Hypertension    Insomnia    a.) on hypnotic (zolpidem )   Menopausal state    Morbid obesity (HCC)    Non-cardiac chest pain    On rivaroxaban  therapy    Osteoarthritis    Ovarian cancer (HCC) 2012   a.) s/p Tx with systemic chemotherapy   Oxygen   desaturation during sleep    a.) on 1-2 L/[place] supplemental oxygen  while sleeping   PAF (paroxysmal atrial fibrillation) (HCC) 06/2020   a. Dx'd in setting of SARS-CoV-2 infection; b. CHA2DS2-VASc = 8 (age x2, sex, HTN, VTE x2, vascular disease history, T2DM) -->Xarelto .   Pneumonia 1987   Rectovaginal fistula    Sleep apnea    a.) does not utilize nocturnal PAP therapy   T2DM (type 2 diabetes mellitus) (HCC)    VTE (venous thromboembolism) 06/2020   a.) developed in  setting of SARS-CoV-2 infection and A.fib with RVR->s/p IVC filter 12/2022 w/ retrieval 02/2023 (surrounding knee surgery).    Past Surgical History: Past Surgical History:  Procedure Laterality Date   ABDOMINAL HYSTERECTOMY     APPENDECTOMY     CHOLECYSTECTOMY     ESOPHAGOGASTRODUODENOSCOPY (EGD) WITH PROPOFOL  N/A 06/28/2021   Procedure: ESOPHAGOGASTRODUODENOSCOPY (EGD) WITH PROPOFOL ;  Surgeon: Maryruth Ole DASEN, MD;  Location: [facility] END[facility];  Service: Endoscopy;  Laterality: N/A;  DM, ON WARFARIN   FINGER ARTHROPLASTY Left 12/22/2014   Procedure: FINGER ARTHROPLASTY;  Surgeon: Lonni Sharps, MD;  Location: [facility] ORS;  Service: Orthopedics;  Laterality: Left;   IVC FILTER INSERTION N/A 12/16/2022   Procedure: IVC FILTER INSERTION;  Surgeon: Jama Cordella MATSU, MD;  Location: [facility] INVASIVE CV LAB;  Service: Cardiovascular;  Laterality: N/A;   IVC FILTER REMOVAL N/A 02/24/2023   Procedure: IVC FILTER REMOVAL;  Surgeon: Jama Cordella MATSU, MD;  Location: [facility] INVASIVE CV LAB;  Service: Cardiovascular;  Laterality: N/A;   KNEE ARTHROPLASTY Left 12/22/2022   Procedure: COMPUTER ASSISTED TOTAL KNEE ARTHROPLASTY;  Surgeon: Mardee Lynwood SQUIBB, MD;  Location: [facility] ORS;  Service: Orthopedics;  Laterality: Left;   KNEE ARTHROSCOPY WITH SUBCHONDROPLASTY Left 06/27/2022   Procedure: Left knee arthroscopic chondroplasty, possible partial meniscectomy, and subchondroplasty to the medial femoral condyle;  Surgeon: Tobie Priest, MD;  Location: [facility];  Service: Orthopedics;  Laterality: Left;  Diabetic      Family History: Family History  Problem Relation Age of Onset   Breast cancer Mother 85   Emphysema Father        smoked   Colon cancer Sister    Uterine cancer Sister    Throat cancer Brother     Social History: Social History   Socioeconomic History   Marital status: Married    Spouse name: Not on file   Number of children: 3   Years of education: Not on file   Highest  education level: Not on file  Occupational History   Occupation: Retired  Tobacco Use   Smoking status: Former    Current packs/day: 0.00    Average packs/day: 2.0 packs/day for 42.0 years (84.0 ttl pk-yrs)    Types: Cigarettes    Start date: 07/04/1965    Quit date: 07/05/2007    Years since quitting: 16.5   Smokeless tobacco: Never  Vaping Use   Vaping status: Never Used  Substance and Sexual Activity   Alcohol use: No    Comment: occasional glass of wine once a month   Drug use: No   Sexual activity: Not on file  Other Topics Concern   Not on file  Social History Narrative   Lives locally w/ husband.  Does not routinely exercise.   Social Drivers of Corporate Investment Banker Strain: Low Risk  (12/22/2023)   Received from [facility]   Overall Financial Resource Strain (CARDIA)    Difficulty of Paying Living Expenses: Not hard at  all  Food Insecurity: No Food Insecurity (12/22/2023)   Received from [facility]   Hunger Vital Sign    Within the past 12 months, you worried that your food would run out before you got the money to buy more.: Never true    Within the past 12 months, the food you bought just didn't last and you didn't have money to get more.: Never true  Transportation Needs: No Transportation Needs (12/22/2023)   Received from [facility] - Transportation    In the past 12 months, has lack of transportation kept you from medical appointments or from getting medications?: No    Lack of Transportation (Non-Medical): No  Physical Activity: Not on file  Stress: No Stress Concern Present (12/07/2023)   [employer] of [employer]ational [employer] - Occupational Stress Questionnaire    Feeling of Stress: Not at all  Social Connections: Not on file  Intimate Partner Violence: Not At Risk (12/07/2023)   Humiliation, Afraid, Rape, and Kick questionnaire    Fear of Current or Ex-Partner: No    Emotionally  Abused: No    Physically Abused: No    Sexually Abused: No    Allergies: Allergies  Allergen Reactions   Iodinated Contrast Media Hives, Shortness Of Breath and Itching    Patient states she had itching, hives, and SOB after injection.    Iohexol  Itching    Breakthrough contrast reaction.  Patient took premedication (13 hr prep) prior to contrasted scan for prior episode itching /nausea/ headache on 01/25/2019 with contrast. Patient experienced breakthrough severe itching and redness of the skin after contrast admin after having taken prep. See Contrast reaction note 04/29/2019 for further details.  Breakthrough contrast reaction.    Patient took premedication (13 hr prep) prior to contrasted scan for prior episode itching /nausea/ headache on 01/25/2019 with contrast. Patient experienced breakthrough severe itching and redness of the skin after contrast admin after having taken prep. See Contrast reaction note 04/29/2019 for further details.    Breakthrough contrast reaction. Patient took premedication (13 hr prep) prior to contrasted scan for prior episode itching /nausea/ headache on 01/25/2019 with contrast. Patient experienced breakthrough severe itching and redness of the skin after contrast admin after having taken prep. See Contrast reaction note 04/29/2019 for further details.    Current Medications: Current Outpatient Medications  Medication Sig Dispense Refill   acetaminophen  (TYLENOL ) 650 MG CR tablet Take 650 mg by mouth every 8 (eight) hours as needed.     albuterol  (VENTOLIN  HFA) 108 (90 Base) MCG/ACT inhaler Inhale 1-2 puffs into the lungs every 4 (four) hours as needed.     bisacodyl  (DULCOLAX) 5 MG EC tablet Take 5 mg by mouth as needed for moderate constipation.     bisoprolol  (ZEBETA ) 5 MG tablet TAKE 1 TABLET EVERY DAY 90 tablet 2   Camphor-Eucalyptus-Menthol  ([employer] MEDICATED CHEST RUB EX) Apply 1 Application topically as needed. Bottom of feet     celecoxib  (CELEBREX )  200 MG capsule Take 1 capsule (200 mg total) by mouth 2 (two) times daily. (Patient not taking: Reported on 12/07/2023) 60 capsule 1   chlorthalidone (HYGROTON) 25 MG tablet Take 1 tablet by mouth daily.     diltiazem  (CARDIZEM  CD) 180 MG 24 hr capsule Take 1 capsule (180 mg total) by mouth daily. 30 capsule 11   Emollient (LUBRIDERM) LOTN Apply 1 Application topically daily as needed.     ferrous sulfate  325 (65 FE) MG EC tablet Take  325 mg by mouth 3 (three) times daily with meals.     gabapentin  (NEURONTIN ) 600 MG tablet Take 600 mg by mouth 2 (two) times daily.     HYDROcodone -acetaminophen  (NORCO) 10-325 MG tablet Take 1 tablet by mouth 2 (two) times daily as needed for moderate pain (pain score 4-6).     hydrOXYzine  (ATARAX ) 50 MG tablet Take 50 mg by mouth at bedtime as needed (sleep).     insulin  NPH-regular Human (70-30) 100 UNIT/ML injection Inject 25 Units into the skin daily as needed (blood sugar of 130 or higher).     LORazepam  (ATIVAN ) 1 MG tablet Take 1 mg by mouth daily.     losartan  (COZAAR ) 100 MG tablet Take 100 mg by mouth daily.     magnesium  oxide (MAG-OX) 400 MG tablet Take 400 mg by mouth in the morning, at noon, in the evening, and at bedtime.     metFORMIN  (GLUCOPHAGE -XR) 500 MG 24 hr tablet Take 1,500 mg by mouth daily with supper.     omeprazole (PRILOSEC) 40 MG capsule Take 40 mg by mouth daily.     oxyCODONE  (OXY IR/ROXICODONE ) 5 MG immediate release tablet Take 1 tablet (5 mg total) by mouth every 4 (four) hours as needed for moderate pain (pain score 4-6) (pain score 4-6). 30 tablet 0   Polyethylene Glycol 3350 (MIRALAX PO) Take 17 g by mouth as needed.     rosuvastatin  (CRESTOR ) 10 MG tablet Take 10 mg by mouth daily.     Semaglutide, 2 MG/DOSE, (OZEMPIC, 2 MG/DOSE,) 8 MG/3ML SOPN Inject 2 mg into the skin once a week.     senna (SENOKOT) 8.6 MG TABS tablet Take 3 tablets by mouth daily.     Spacer/Aero-Holding Chambers (EASIVENT) inhaler Use as instructed with  albuterol  inhaler     traMADol  (ULTRAM ) 50 MG tablet Take 1-2 tablets (50-100 mg total) by mouth every 4 (four) hours as needed for moderate pain (pain score 4-6). 30 tablet 0   venlafaxine  XR (EFFEXOR -XR) 150 MG 24 hr capsule Take 150 mg by mouth daily. (take with 37.5mg  capsule to equal 187.5mg  total)     venlafaxine  XR (EFFEXOR -XR) 37.5 MG 24 hr capsule Take 37.5 mg by mouth daily. (take with 150mg  capsule to equal 187.5mg  total)     vitamin B-12 (CYANOCOBALAMIN ) 1000 MCG tablet Take 1,000 mcg by mouth daily.     Vitamin D , Ergocalciferol , (DRISDOL) 1.25 MG (50000 UNIT) CAPS capsule Take 50,000 Units by mouth once a week.     XARELTO  20 MG TABS tablet TAKE 1 TABLET EVERY DAY WITH SUPPER 90 tablet 3   zolpidem  (AMBIEN ) 10 MG tablet Take 10 mg by mouth at bedtime as needed for sleep.     No current facility-administered medications for this visit.    Review of Systems General: negative for, fevers, chills, fatigue, changes in sleep, changes in weight or appetite Skin: negative for changes in color, texture, moles or lesions Eyes: negative for, changes in vision, pain, diplopia HEENT: negative for, change in hearing, pain, discharge, tinnitus, vertigo, voice changes, sore throat, neck masses Pulmonary: negative for, dyspnea, orthopnea, productive cough Cardiac: negative for, palpitations, syncope, pain, discomfort, pressure Gastrointestinal: negative for, dysphagia, nausea, vomiting, jaundice, pain, constipation, diarrhea, hematemesis, hematochezia Genitourinary/Sexual: negative for, dysuria, discharge, hesitancy, nocturia, retention, stones, infections, STD's, incontinence Ob/Gyn: negative for, irregular bleeding, pain Musculoskeletal: negative for, pain, stiffness, swelling, range of motion limitation Hematology: negative for, easy bruising, bleeding Neurologic/Psych: negative for, headaches, seizures, paralysis, weakness, tremor,  change in gait, change in sensation, mood swings,  depression, anxiety, change in memory  Objective:  Physical Examination:  There were no vitals taken for this visit.   ECOG Performance Status: 1 - Symptomatic but completely ambulatory  General appearance: alert, cooperative, and appears stated age HEENT:PERRLA and thyroid  without masses Lymph node survey: non-palpable, axillary, inguinal, supraclavicular Cardiovascular: regular rate and rhythm, no murmurs or gallops Respiratory: normal air entry, lungs clear to auscultation and no rales, rhonchi or wheezing Abdomen: soft, non-tender, without masses or organomegaly, no hernias, and well healed incision Back: inspection of back is normal Extremities: extremities normal, atraumatic, no cyanosis or edema Skin exam - normal coloration and turgor, no rashes, no suspicious skin lesions noted. Neurological exam reveals alert, oriented, normal speech, no focal findings or movement disorder noted.  Pelvic: exam chaperoned by nurse;  Vulva: normal appearing vulva with no masses, tenderness or lesions; Vagina: normal vagina; Bimanual: no masses  Lab Review   CA125 11/25 = 38.6  Lab Results  Component Value Date   WBC 11.3 (H) 12/07/2023   HGB 12.7 12/07/2023   HCT 37.3 12/07/2023   MCV 89.2 12/07/2023   PLT 410 (H) 12/07/2023     Chemistry      Component Value Date/Time   NA 132 (L) 12/07/2023 1218   NA 139 12/03/2023 1603   K 3.4 (L) 12/07/2023 1218   CL 94 (L) 12/07/2023 1218   CO2 25 12/07/2023 1218   BUN 27 (H) 12/07/2023 1218   BUN 38 (H) 12/03/2023 1603   CREATININE 1.01 (H) 12/07/2023 1218      Component Value Date/Time   CALCIUM  8.4 (L) 12/07/2023 1218   ALKPHOS 68 12/07/2023 1218   AST 19 12/07/2023 1218   ALT 12 12/07/2023 1218   BILITOT 0.5 12/07/2023 1218       Radiologic Imaging: 12/18/23 FINDINGS: Lower chest: No acute findings.   Hepatobiliary: No mass visualized on this unenhanced exam. Prior cholecystectomy. No evidence of biliary obstruction.    Pancreas: No mass or inflammatory process visualized on this unenhanced exam.   Spleen:  Within normal limits in size.   Adrenals/Urinary tract: No evidence of urolithiasis or hydronephrosis. Unremarkable unopacified urinary bladder.   Stomach/Bowel: No evidence of obstruction, inflammatory process, or abnormal fluid collections. Diverticulosis is seen mainly involving the descending and sigmoid colon, however there is no evidence of diverticulitis.   Vascular/Lymphatic: No pathologically enlarged lymph nodes identified. No evidence of abdominal aortic aneurysm.   Reproductive: Prior hysterectomy. No No evidence of pelvic mass. No No evidence of peritoneal thickening, omental caking, or ascites.   Other: Small umbilical hernia is again seen containing a small bowel loop. No evidence of bowel obstruction or strangulation. A small epigastric ventral hernia is again seen which contains only omental fat.   Musculoskeletal:  No suspicious bone lesions identified.   IMPRESSION: No acute findings.   No evidence of recurrent or metastatic carcinoma.   Colonic diverticulosis, without radiographic evidence of diverticulitis.   Stable small umbilical and epigastric ventral hernias.    Assessment:  Kayla Wu is a 78 y.o. female with history of stage IC endometrioid ovarian cancer treated at [facility].  Initially presented with stage Ic grade 3 endometrial ovarian cancer in 2012.  She had BSO, surgery TLH/omentectomy. 6 cycles of carboplatin  and Taxol .  2016, had recurrent disease with CA125 increased to 50.6. 02/09/2015  Infragastric omentectomy, supralevator posterior exenteration, iintraperitoneal rectosigmoid resection, mobilization splenic flexure, tumor debulking from anterior abdominal wall, bladder  peritoneum, rectosigmoid. Ablation tumor from liver surface   January 2021 developed liver recurrence confirmed by liver biopsy. 02/28/2019 - 04/11/2019  Retreatment with carboplatin   and Taxol . Status post CyberKnife.   NED on exam today.  CA125 mildly elevated at 38, but this is in range of where it has been at [facility]. Recent CT scan with no evidence of disease 11/25.   Negative germline genetic testing.   History of VTE.   Medical co-morbidities complicating care: multiple comorbidities.  Plan:   Problem List Items Addressed This Visit   None Visit Diagnoses       Encounter for follow-up surveillance of ovarian cancer    -  Primary   Relevant Orders   CA 125       We discussed options for management including following serial CA125 levels.  RTC 6 months.  Will also follow with Dr Babara.   The patient's diagnosis, an outline of the further diagnostic and laboratory studies which will be required, the recommendation, and alternatives were discussed.  All questions were answered to the patient's satisfaction.  A total of 40 minutes were spent with the patient/family today; 50% was spent in education, counseling and coordination of care for history of endometrioid ovarian cancer.    Prentice Agent, MD  CC:  Auston Reyes BIRCH, MD [address] [facility] [place],  [place] [address] [phone]

## 2024-01-11 ENCOUNTER — Other Ambulatory Visit: Payer: Self-pay

## 2024-01-11 ENCOUNTER — Inpatient Hospital Stay
Admission: RE | Admit: 2024-01-11 | Discharge: 2024-01-11 | Disposition: A | Payer: Self-pay | Source: Ambulatory Visit | Attending: Orthopedic Surgery | Admitting: Orthopedic Surgery

## 2024-01-11 DIAGNOSIS — Z049 Encounter for examination and observation for unspecified reason: Secondary | ICD-10-CM

## 2024-01-11 NOTE — Progress Notes (Unsigned)
 Referring Physician:  Mardee Lynwood SQUIBB, MD [address] [facility] [place],  [place] [address]  Primary Physician:  Auston Reyes BIRCH, MD  History of Present Illness: 01/12/2024 [name] has a history of afib, CAD, OSA, HTN, PE after covid, COPD, emphysema, GERD, DM, ovarian CA, hyperlipidemia, high cholesterol.   She has been seeing PMR at [facility]. No improvement with previous lumbar injections. Had 90% relief with left hip injection for 6 weeks.   She has constant LBP with radiation into left groin and left medial leg to her ankle x 6 months. She has weakness and tingling in her legs. Pain is worse with getting up from seated position. No alleviating factors.    Her primary complaint is left groin pain- she did have relief of this pain after her hip injection for about 5 weeks. This is worse with getting in/out of car. She has seen Dr. Hooten and he did not recommend any surgery for her hip.   She is on prn hydrocodone  from PCP. She takes XARELTO .   She has an allergy to iodinated contrast media. CT scan with contrast caused hives and shortness of breath around 2019. NO contrast or Betadine.   Tobacco use: Does not smoke.   Bowel/Bladder Dysfunction: none  Conservative measures:  Physical therapy: has not participated in Multimodal medical therapy including regular antiinflammatories: Tylenol , Hydrocodone , Gabapentin  Injections: 11/19/2023: Bilateral L4-5 transforaminal ESI (little relief) (no relief) 10/20/2023: Bilateral L3-4 transforaminal ESI (30% relief for one week) 09/03/2023: Left hip joint injection (90% relief for 6 weeks) 03/29/2021: Left L5-S1 and left S1 transforaminal ESI (100%, no contrast) 03/08/2021: Left L5-S1 transforaminal ESI (little relief, no contrast) 12/14/2020: Left hip joint injection (100% relief, no contrast)   Past Surgery: no spine surgery  Kayla Wu has no symptoms of cervical myelopathy.  The symptoms are causing  a significant impact on the patient's life.   Review of Systems:  A 10 point review of systems is negative, except for the pertinent positives and negatives detailed in the HPI.  Past Medical History: Past Medical History:  Diagnosis Date   Anxiety    a.) on BZO PRN (lorazepam )   Aortic atherosclerosis    CAD (coronary artery disease) - non-obstructive    a.) MV 05/23/2014: EF 75%, no ischemia/artifact; b.) cCTA 09/16/2021: Ca2+ 340 (79th %ile). LM nl, LAD 25-40p, LCX dominant, <25p, RCA nondom, nl.   Chronic hypoxemic respiratory failure (HCC)    Chronic urticaria    COPD (chronic obstructive pulmonary disease) (HCC)    DDD (degenerative disc disease), lumbar    Depression    Diastolic dysfunction    a.) TTE 05/23/2014: EF >55%, no RWMAs, G1DD, triv AR, mild MR, RVSP 37.1; b.) TTE 01/19/2018: EF >55%, mild LVH, no RWMAs, G1DD, mild LAE, triv AR/MR/PR, mild TR, RVSP 36.1; c.) TTE 06/29/2020: EF 60-65%, no RWMAs, G1DD, RVSP 14.9   GERD (gastroesophageal reflux disease)    Hepatic steatosis    History of 2019 novel coronavirus disease (COVID-19) 06/25/2020   HLD (hyperlipidemia)    Hypertension    Insomnia    a.) on hypnotic (zolpidem )   Menopausal state    Morbid obesity (HCC)    Non-cardiac chest pain    On rivaroxaban  therapy    Osteoarthritis    Ovarian cancer (HCC) 2012   a.) s/p Tx with systemic chemotherapy   Oxygen  desaturation during sleep    a.) on 1-2 L/[place] supplemental oxygen  while sleeping   PAF (paroxysmal  atrial fibrillation) (HCC) 06/2020   a. Dx'd in setting of SARS-CoV-2 infection; b. CHA2DS2-VASc = 8 (age x2, sex, HTN, VTE x2, vascular disease history, T2DM) -->Xarelto .   Pneumonia 1987   Rectovaginal fistula    Sleep apnea    a.) does not utilize nocturnal PAP therapy   T2DM (type 2 diabetes mellitus) (HCC)    VTE (venous thromboembolism) 06/2020   a.) developed in setting of SARS-CoV-2 infection and A.fib with RVR->s/p IVC filter 12/2022 w/ retrieval  02/2023 (surrounding knee surgery).    Past Surgical History: Past Surgical History:  Procedure Laterality Date   ABDOMINAL HYSTERECTOMY     APPENDECTOMY     CHOLECYSTECTOMY     ESOPHAGOGASTRODUODENOSCOPY (EGD) WITH PROPOFOL  N/A 06/28/2021   Procedure: ESOPHAGOGASTRODUODENOSCOPY (EGD) WITH PROPOFOL ;  Surgeon: Maryruth Ole DASEN, MD;  Location: [facility];  Service: Endoscopy;  Laterality: N/A;  DM, ON WARFARIN   FINGER ARTHROPLASTY Left 12/22/2014   Procedure: FINGER ARTHROPLASTY;  Surgeon: Lonni Sharps, MD;  Location: [facility] OR[facility];  Service: Orthopedics;  Laterality: Left;   IVC FILTER INSERTION N/A 12/16/2022   Procedure: IVC FILTER INSERTION;  Surgeon: Jama Cordella MATSU, MD;  Location: [facility];  Service: Cardiovascular;  Laterality: N/A;   IVC FILTER REMOVAL N/A 02/24/2023   Procedure: IVC FILTER REMOVAL;  Surgeon: Jama Cordella MATSU, MD;  Location: [facility];  Service: Cardiovascular;  Laterality: N/A;   KNEE ARTHROPLASTY Left 12/22/2022   Procedure: COMPUTER ASSISTED TOTAL KNEE ARTHROPLASTY;  Surgeon: Mardee Lynwood SQUIBB, MD;  Location: [facility];  Service: Orthopedics;  Laterality: Left;   KNEE ARTHROSCOPY WITH SUBCHONDROPLASTY Left 06/27/2022   Procedure: Left knee arthroscopic chondroplasty, possible partial meniscectomy, and subchondroplasty to the medial femoral condyle;  Surgeon: Tobie Priest, MD;  [facility];  Service: Orthopedics;  Laterality: Left;  Diabetic    Allergies: Allergies as of 01/12/2024 - Review Complete 01/12/2024  Allergen Reaction Noted   Iodinated contrast media Hives, Shortness Of Breath, and Itching 01/19/2020   Iohexol  Itching 04/29/2019    Medications: Outpatient Encounter Medications as of 01/12/2024  Medication Sig   acetaminophen  (TYLENOL ) 650 MG CR tablet Take 650 mg by mouth every 8 (eight) hours as needed.   albuterol  (VENTOLIN  HFA) 108 (90 Base) MCG/ACT inhaler Inhale 1-2 puffs into the lungs every 4  (four) hours as needed.   bisacodyl  (DULCOLAX) 5 MG EC tablet Take 5 mg by mouth as needed for moderate constipation.   bisoprolol  (ZEBETA ) 5 MG tablet TAKE 1 TABLET EVERY DAY   Camphor-Eucalyptus-Menthol  ([employer] MEDICATED CHEST RUB EX) Apply 1 Application topically as needed. Bottom of feet   chlorthalidone (HYGROTON) 25 MG tablet Take 1 tablet by mouth daily.   diltiazem  (CARDIZEM  CD) 180 MG 24 hr capsule Take 1 capsule (180 mg total) by mouth daily.   Emollient (LUBRIDERM) LOTN Apply 1 Application topically daily as needed.   ferrous sulfate  325 (65 FE) MG EC tablet Take 325 mg by mouth 3 (three) times daily with meals.   gabapentin  (NEURONTIN ) 600 MG tablet Take 600 mg by mouth 2 (two) times daily.   hydrOXYzine  (ATARAX ) 50 MG tablet Take 50 mg by mouth at bedtime as needed (sleep).   insulin  NPH-regular Human (70-30) 100 UNIT/ML injection Inject 25 Units into the skin daily as needed (blood sugar of 130 or higher).   LORazepam  (ATIVAN ) 1 MG tablet Take 1 mg by mouth daily.   losartan  (COZAAR ) 100 MG tablet Take 100 mg by mouth daily.   magnesium   oxide (MAG-OX) 400 MG tablet Take 400 mg by mouth in the morning, at noon, in the evening, and at bedtime.   metFORMIN  (GLUCOPHAGE -XR) 500 MG 24 hr tablet Take 1,500 mg by mouth daily with supper.   omeprazole (PRILOSEC) 40 MG capsule Take 40 mg by mouth daily.   Polyethylene Glycol 3350 (MIRALAX PO) Take 17 g by mouth as needed.   rosuvastatin  (CRESTOR ) 10 MG tablet Take 10 mg by mouth daily.   Semaglutide, 2 MG/DOSE, (OZEMPIC, 2 MG/DOSE,) 8 MG/3ML SOPN Inject 2 mg into the skin once a week.   senna (SENOKOT) 8.6 MG TABS tablet Take 3 tablets by mouth daily.   Spacer/Aero-Holding Chambers (EASIVENT) inhaler Use as instructed with albuterol  inhaler   traMADol  (ULTRAM ) 50 MG tablet Take 1-2 tablets (50-100 mg total) by mouth every 4 (four) hours as needed for moderate pain (pain score 4-6).   venlafaxine  XR (EFFEXOR -XR) 150 MG 24 hr capsule Take 150 mg  by mouth daily. (take with 37.5mg  capsule to equal 187.5mg  total)   venlafaxine  XR (EFFEXOR -XR) 37.5 MG 24 hr capsule Take 37.5 mg by mouth daily. (take with 150mg  capsule to equal 187.5mg  total)   vitamin B-12 (CYANOCOBALAMIN ) 1000 MCG tablet Take 1,000 mcg by mouth daily.   Vitamin D , Ergocalciferol , (DRISDOL) 1.25 MG (50000 UNIT) CAPS capsule Take 50,000 Units by mouth once a week.   XARELTO  20 MG TABS tablet TAKE 1 TABLET EVERY DAY WITH SUPPER   [DISCONTINUED] celecoxib  (CELEBREX ) 200 MG capsule Take 1 capsule (200 mg total) by mouth 2 (two) times daily. (Patient not taking: Reported on 12/07/2023)   [DISCONTINUED] HYDROcodone -acetaminophen  (NORCO) 10-325 MG tablet Take 1 tablet by mouth 2 (two) times daily as needed for moderate pain (pain score 4-6).   [DISCONTINUED] oxyCODONE  (OXY IR/ROXICODONE ) 5 MG immediate release tablet Take 1 tablet (5 mg total) by mouth every 4 (four) hours as needed for moderate pain (pain score 4-6) (pain score 4-6).   [DISCONTINUED] zolpidem  (AMBIEN ) 10 MG tablet Take 10 mg by mouth at bedtime as needed for sleep.   No facility-administered encounter medications on file as of 01/12/2024.    Social History: Social History   Tobacco Use   Smoking status: Former    Current packs/day: 0.00    Average packs/day: 2.0 packs/day for 42.0 years (84.0 ttl pk-yrs)    Types: Cigarettes    Start date: 07/04/1965    Quit date: 07/05/2007    Years since quitting: 16.5   Smokeless tobacco: Never  Vaping Use   Vaping status: Never Used  Substance Use Topics   Alcohol use: No    Comment: occasional glass of wine once a month   Drug use: No    Family Medical History: Family History  Problem Relation Age of Onset   Breast cancer Mother 56   Emphysema Father        smoked   Colon cancer Sister    Uterine cancer Sister    Throat cancer Brother     Physical Examination: Vitals:   01/12/24 1110  BP: 120/80    General: Patient is well developed, well nourished,  calm, collected, and in no apparent distress. Attention to examination is appropriate.  Respiratory: Patient is breathing without any difficulty.   NEUROLOGICAL:     Awake, alert, oriented to person, place, and time.  Speech is clear and fluent. Fund of knowledge is appropriate.   Cranial Nerves: Pupils equal round and reactive to light.  Facial tone is symmetric.  Mild lower posterior lumbar tenderness.   No abnormal lesions on exposed skin.   Strength: Side Biceps Triceps Deltoid Interossei Grip Wrist Ext. Wrist Flex.  R 5 5 5 5 5 5 5   L 5 5 5 5 5 5 5    Side Iliopsoas Quads Hamstring PF DF EHL  R 5 5 5 5 5 5   L 5 5 5 5 5 5    Reflexes are 2+ and symmetric at the biceps, brachioradialis, patella and achilles.   Hoffman's is absent.  Clonus is not present.   Bilateral upper and lower extremity sensation is intact to light touch.     She has limited ROM of left hip on IR/ER with recreation of groin pain. No pain with ROM of right hip.   She has a slow gait.   Medical Decision Making  Imaging: Lumbar MRI dated 09/10/23:  FINDINGS: Segmentation: There are five lumbar type vertebral bodies. The last full intervertebral disc space is labeled L5-S1. This correlates with the prior study.   Alignment: Stable degenerative lumbar spondylosis with mild multilevel degenerative listhesis.   Vertebrae: Progressive endplate reactive changes at L4-5 no bone lesions or fractures.   Conus medullaris and cauda equina: Conus extends to the bottom of L1 level. Conus and cauda equina appear normal.   Paraspinal and other soft tissues: No significant paraspinal or retroperitoneal findings.   Disc levels:   T12-L1: No significant findings.   L1-2: Mild bulging uncovered disc but the spinal canal is generous and there is no spinal or foraminal stenosis. Mild facet disease.   L2-3: Advanced degenerative disc disease appears progressive. Broad-based bulging slightly uncovered disc  and osteophytic ridging with mass effect on the ventral thecal sac and obliteration of the ventral CSF space with moderate spinal and bilateral lateral recess stenosis. There is also a broad-based right-sided disc osteophyte complex contributing to extraforaminal encroachment on right L2 nerve root. This appears stable to slightly progressive. Stable moderate facet disease and ligamentum flavum thickening.   L3-4: Bulging uncovered disc, osteophytic ridging, facet disease and ligamentum flavum thickening contributing to mild bilateral lateral recess stenosis but no significant spinal or foraminal stenosis.   L4-5: Bulging degenerated uncovered disc, progressive central disc protrusion, facet disease and ligamentum flavum thickening contributing to moderate spinal and bilateral lateral recess stenosis. There is also a broad-based left-sided disc osteophyte complex contacting and displacing the left L4 nerve root. This is a progressive finding and may account for the patient's left radicular symptoms.   L5-S1: Annular bulge and facet disease but no spinal significant foraminal stenosis. Stable advanced facet disease.   IMPRESSION: 1. Progressive degenerative lumbar spondylosis with multilevel disc disease and facet disease. 2. Progressive moderate spinal and bilateral lateral recess stenosis at L2-3. There is also a broad-based right-sided disc osteophyte complex contributing to extraforaminal encroachment on the right L2 nerve root. This appears stable to slightly progressive. 3. Mild bilateral lateral recess stenosis at L3-4 but no significant spinal or foraminal stenosis. 4. Moderate spinal and bilateral lateral recess stenosis at L4-5. There is also a broad-based left-sided disc osteophyte complex contacting and displacing the left L4 nerve root. This is a progressive finding and may account for the patient's left radicular symptoms.     Electronically Signed   By: MYRTIS Stammer M.D.   On: 09/19/2023 14:12    I have personally reviewed the images and agree with the above interpretation.   Lumbar xrays dated 12/22/23:  Severe DDD L2-L3 and L4-S1 with diffuse lumbar  spondylosis.   No report for above xrays.    Assessment and Plan: Ms. Sobh has 6 month history of constant LBP with radiation into left groin and left medial leg to her ankle/ She has weakness and tingling in her legs.   Her primary complaint is left groin pain- she did have relief of this pain after her hip injection for about 5 weeks. This is worse with getting in/out of car. She has seen Dr. Hooten and he did not recommend any surgery for her hip.   She has known lumbar spondylosis with severe DDD L2-L3 and L4-S1. She has moderate central stenosis L2-L3 with moderate bilateral foraminal stenosis and possible impingement right L2 nerve. She has mild bilateral lateral recess stenosis L3-L4. She has moderate central stenosis L4-L5 with displacement of left L4 nerve root.   Left groin pain is likely from left hip- she had improvement with left injection and pain is recreated with IR/ER of her left hip. Left foraminal stenosis at L2 may be contributing as well.   LBP and left leg pain likely are lumbar mediated.   Treatment options discussed with patient and following plan made:   - Recommend PT for lumbar spine. She requests to go to [facility]. Message to [place] to put in orders.  - She is not sure she is interested in any surgery options for her back.  - Follow up with me in 6-8 weeks. If no improvement, may consider bilateral LE EMG/NCS.  - May also consider follow up with ortho/Hooten regarding her left hip/groin pain.   I spent a total of 50 minutes in face-to-face and non-face-to-face activities related to this patient's care today including review of outside records, review of imaging, review of symptoms, physical exam, discussion of differential diagnosis, discussion of treatment  options, and documentation.   Thank you for involving me in the care of this patient.   Glade Boys PA-C Dept. of Neurosurgery

## 2024-01-12 ENCOUNTER — Inpatient Hospital Stay
Admission: RE | Admit: 2024-01-12 | Discharge: 2024-01-12 | Disposition: A | Payer: Self-pay | Source: Ambulatory Visit | Attending: Orthopedic Surgery | Admitting: Orthopedic Surgery

## 2024-01-12 ENCOUNTER — Ambulatory Visit: Admitting: Orthopedic Surgery

## 2024-01-12 ENCOUNTER — Other Ambulatory Visit: Payer: Self-pay

## 2024-01-12 ENCOUNTER — Encounter: Payer: Self-pay | Admitting: Orthopedic Surgery

## 2024-01-12 VITALS — BP 120/80 | Ht 64.0 in | Wt 186.0 lb

## 2024-01-12 DIAGNOSIS — M48061 Spinal stenosis, lumbar region without neurogenic claudication: Secondary | ICD-10-CM

## 2024-01-12 DIAGNOSIS — M25552 Pain in left hip: Secondary | ICD-10-CM | POA: Diagnosis not present

## 2024-01-12 DIAGNOSIS — M51362 Other intervertebral disc degeneration, lumbar region with discogenic back pain and lower extremity pain: Secondary | ICD-10-CM | POA: Diagnosis not present

## 2024-01-12 DIAGNOSIS — M47816 Spondylosis without myelopathy or radiculopathy, lumbar region: Secondary | ICD-10-CM

## 2024-01-12 DIAGNOSIS — R1032 Left lower quadrant pain: Secondary | ICD-10-CM | POA: Diagnosis not present

## 2024-01-12 DIAGNOSIS — M4726 Other spondylosis with radiculopathy, lumbar region: Secondary | ICD-10-CM | POA: Diagnosis not present

## 2024-01-12 DIAGNOSIS — M5416 Radiculopathy, lumbar region: Secondary | ICD-10-CM

## 2024-01-12 DIAGNOSIS — Z049 Encounter for examination and observation for unspecified reason: Secondary | ICD-10-CM

## 2024-01-14 ENCOUNTER — Inpatient Hospital Stay
Admission: RE | Admit: 2024-01-14 | Discharge: 2024-01-14 | Disposition: A | Discharge status: Home / Self Care | Payer: Self-pay | Source: Ambulatory Visit | Attending: Orthopedic Surgery | Admitting: Orthopedic Surgery

## 2024-01-14 ENCOUNTER — Other Ambulatory Visit: Payer: Self-pay

## 2024-01-14 DIAGNOSIS — Z049 Encounter for examination and observation for unspecified reason: Secondary | ICD-10-CM

## 2024-01-25 ENCOUNTER — Ambulatory Visit: Admitting: Orthopedic Surgery

## 2024-02-22 ENCOUNTER — Other Ambulatory Visit: Payer: Self-pay | Admitting: Cardiovascular Disease

## 2024-02-22 DIAGNOSIS — I4891 Unspecified atrial fibrillation: Secondary | ICD-10-CM

## 2024-03-04 NOTE — Progress Notes (Unsigned)
 "  Referring Physician:  Auston Reyes BIRCH, MD [address] [facility] [place],  [place] [address]  Primary Physician:  Auston Reyes BIRCH, MD  History of Present Illness: [name] has a history of afib, CAD, OSA, HTN, PE after covid, COPD, emphysema, GERD, DM, ovarian CA, hyperlipidemia, high cholesterol.   Last seen by me on 01/12/24 for constant LBP with left groin and left medial leg pain to her ankle. Primary complaint was left groin pain (improved after hip injection). Dr. Mardee did not recommend surgery for her left hip.   She has known lumbar spondylosis with severe DDD L2-L3 and L4-S1. She has moderate central stenosis L2-L3 with moderate bilateral foraminal stenosis and possible impingement right L2 nerve. She has mild bilateral lateral recess stenosis L3-L4. She has moderate central stenosis L4-L5 with displacement of left L4 nerve root.   Left foraminal stenosis at L2 may be contributing to left hip pain.   She was sent to PT at her last visit- initial evaluation on 02/16/24 with 5 visits through 03/04/24. She was not sure she was interested in any surgery. She had repeat left hip injection with Dr. Sharrie on 01/20/24 with no improvement in her pain.   She is here for follow up.   She continues with PT and she feels like it making her stronger, not helping with pain.   She continues with constant LBP with radiation into left groin and left medial leg to her ankle. She has weakness and tingling in her left leg. Pain is worse with getting up from seated position. No right leg pain. Also worse with standing and walking. No alleviating factors.    She is on prn hydrocodone  from PCP. She takes XARELTO .   She has an allergy to iodinated contrast media. CT scan with contrast caused hives and shortness of breath around 2019. NO contrast or Betadine.   Tobacco use: Does not smoke.   Bowel/Bladder Dysfunction: none  Conservative measures:  Physical therapy:  initial evaluation on 02/16/24 with 5 visits through 03/04/24 Multimodal medical therapy including regular antiinflammatories: Tylenol , Hydrocodone , Gabapentin  Injections: 01/20/24; Left hip injection under US  with no relief 11/19/2023: Bilateral L4-5 transforaminal ESI (little relief) (no relief) 10/20/2023: Bilateral L3-4 transforaminal ESI (30% relief for one week) 09/03/2023: Left hip joint injection (90% relief for 6 weeks) 03/29/2021: Left L5-S1 and left S1 transforaminal ESI (100%, no contrast) 03/08/2021: Left L5-S1 transforaminal ESI (little relief, no contrast) 12/14/2020: Left hip joint injection (100% relief, no contrast)   Past Surgery: no spine surgery  [name] has no symptoms of cervical myelopathy.  The symptoms are causing a significant impact on the patient's life.   Review of Systems:  A 10 point review of systems is negative, except for the pertinent positives and negatives detailed in the HPI.  Past Medical History: Past Medical History:  Diagnosis Date   Anxiety    a.) on BZO PRN (lorazepam )   Aortic atherosclerosis    CAD (coronary artery disease) - non-obstructive    a.) MV 05/23/2014: EF 75%, no ischemia/artifact; b.) cCTA 09/16/2021: Ca2+ 340 (79th %ile). LM nl, LAD 25-40p, LCX dominant, <25p, RCA nondom, nl.   Chronic hypoxemic respiratory failure (HCC)    Chronic urticaria    COPD (chronic obstructive pulmonary disease) (HCC)    DDD (degenerative disc disease), lumbar    Depression    Diastolic dysfunction    a.) TTE 05/23/2014: EF >55%, no RWMAs, G1DD, triv AR, mild MR, RVSP 37.1;  b.) TTE 01/19/2018: EF >55%, mild LVH, no RWMAs, G1DD, mild LAE, triv AR/MR/PR, mild TR, RVSP 36.1; c.) TTE 06/29/2020: EF 60-65%, no RWMAs, G1DD, RVSP 14.9   GERD (gastroesophageal reflux disease)    Hepatic steatosis    History of 2019 novel coronavirus disease (COVID-19) 06/25/2020   HLD (hyperlipidemia)    Hypertension    Insomnia    a.) on hypnotic  (zolpidem )   Menopausal state    Morbid obesity (HCC)    Non-cardiac chest pain    On rivaroxaban  therapy    Osteoarthritis    Ovarian cancer (HCC) 2012   a.) s/p Tx with systemic chemotherapy   Oxygen  desaturation during sleep    a.) on 1-2 L/[place] supplemental oxygen  while sleeping   PAF (paroxysmal atrial fibrillation) (HCC) 06/2020   a. Dx'd in setting of SARS-CoV-2 infection; b. CHA2DS2-VASc = 8 (age x2, sex, HTN, VTE x2, vascular disease history, T2DM) -->Xarelto .   Pneumonia 1987   Rectovaginal fistula    Sleep apnea    a.) does not utilize nocturnal PAP therapy   T2DM (type 2 diabetes mellitus) (HCC)    VTE (venous thromboembolism) 06/2020   a.) developed in setting of SARS-CoV-2 infection and A.fib with RVR->s/p IVC filter 12/2022 w/ retrieval 02/2023 (surrounding knee surgery).    Past Surgical History: Past Surgical History:  Procedure Laterality Date   ABDOMINAL HYSTERECTOMY     APPENDECTOMY     CHOLECYSTECTOMY     ESOPHAGOGASTRODUODENOSCOPY (EGD) WITH PROPOFOL  N/A 06/28/2021   Procedure: ESOPHAGOGASTRODUODENOSCOPY (EGD) WITH PROPOFOL ;  Surgeon: Maryruth Ole DASEN, MD;  Location: [facility];  Service: Endoscopy;  Laterality: N/A;  DM, ON WARFARIN   FINGER ARTHROPLASTY Left 12/22/2014   Procedure: FINGER ARTHROPLASTY;  Surgeon: Lonni Sharps, MD;  Location: [facility];  Service: Orthopedics;  Laterality: Left;   IVC FILTER INSERTION N/A 12/16/2022   Procedure: IVC FILTER INSERTION;  Surgeon: Jama Cordella MATSU, MD;  Location: [facility];  Service: Cardiovascular;  Laterality: N/A;   IVC FILTER REMOVAL N/A 02/24/2023   Procedure: IVC FILTER REMOVAL;  Surgeon: Jama Cordella MATSU, MD;  Location: [facility];  Service: Cardiovascular;  Laterality: N/A;   KNEE ARTHROPLASTY Left 12/22/2022   Procedure: COMPUTER ASSISTED TOTAL KNEE ARTHROPLASTY;  Surgeon: Mardee Lynwood SQUIBB, MD;  Location: [facility];  Service: Orthopedics;  Laterality: Left;   KNEE  ARTHROSCOPY WITH SUBCHONDROPLASTY Left 06/27/2022   Procedure: Left knee arthroscopic chondroplasty, possible partial meniscectomy, and subchondroplasty to the medial femoral condyle;  Surgeon: Tobie Priest, MD;  Location: [facility];  Service: Orthopedics;  Laterality: Left;  Diabetic    Allergies: Allergies as of 03/08/2024 - Review Complete 03/08/2024  Allergen Reaction Noted   Iodinated contrast media Hives, Shortness Of Breath, and Itching 01/19/2020   Iohexol  Itching 04/29/2019    Medications: Outpatient Encounter Medications as of 03/08/2024  Medication Sig   acetaminophen  (TYLENOL ) 650 MG CR tablet Take 650 mg by mouth every 8 (eight) hours as needed.   albuterol  (VENTOLIN  HFA) 108 (90 Base) MCG/ACT inhaler Inhale 1-2 puffs into the lungs every 4 (four) hours as needed.   bisacodyl  (DULCOLAX) 5 MG EC tablet Take 5 mg by mouth as needed for moderate constipation.   bisoprolol  (ZEBETA ) 5 MG tablet TAKE 1 TABLET EVERY DAY   Camphor-Eucalyptus-Menthol  ([employer] MEDICATED CHEST RUB EX) Apply 1 Application topically as needed. Bottom of feet   chlorthalidone (HYGROTON) 25 MG tablet Take 1 tablet by mouth daily.   ciprofloxacin (CIPRO) 500 MG tablet  Take 500 mg by mouth.   diltiazem  (CARDIZEM  CD) 180 MG 24 hr capsule Take 1 capsule (180 mg total) by mouth daily.   Dulaglutide (TRULICITY) 1.5 MG/0.5ML SOAJ Inject 1.5 mg into the skin.   Emollient (LUBRIDERM) LOTN Apply 1 Application topically daily as needed.   ferrous sulfate  325 (65 FE) MG EC tablet Take 325 mg by mouth 3 (three) times daily with meals.   gabapentin  (NEURONTIN ) 600 MG tablet Take 600 mg by mouth 2 (two) times daily.   hydrOXYzine  (ATARAX ) 50 MG tablet Take 50 mg by mouth at bedtime as needed (sleep).   insulin  NPH-regular Human (70-30) 100 UNIT/ML injection Inject 25 Units into the skin daily as needed (blood sugar of 130 or higher).   losartan  (COZAAR ) 100 MG tablet Take 100 mg by mouth daily.   magnesium  oxide  (MAG-OX) 400 MG tablet Take 400 mg by mouth in the morning, at noon, in the evening, and at bedtime.   metFORMIN  (GLUCOPHAGE -XR) 500 MG 24 hr tablet Take 1,500 mg by mouth daily with supper.   metroNIDAZOLE (FLAGYL) 500 MG tablet Take 500 mg by mouth.   omeprazole (PRILOSEC) 40 MG capsule Take 40 mg by mouth daily.   Polyethylene Glycol 3350 (MIRALAX PO) Take 17 g by mouth as needed.   rosuvastatin  (CRESTOR ) 10 MG tablet Take 10 mg by mouth daily.   Semaglutide, 2 MG/DOSE, (OZEMPIC, 2 MG/DOSE,) 8 MG/3ML SOPN Inject 2 mg into the skin once a week.   senna (SENOKOT) 8.6 MG TABS tablet Take 3 tablets by mouth daily.   Spacer/Aero-Holding Chambers (EASIVENT) inhaler Use as instructed with albuterol  inhaler   traMADol  (ULTRAM ) 50 MG tablet Take 1-2 tablets (50-100 mg total) by mouth every 4 (four) hours as needed for moderate pain (pain score 4-6).   venlafaxine  XR (EFFEXOR -XR) 150 MG 24 hr capsule Take 150 mg by mouth daily. (take with 37.5mg  capsule to equal 187.5mg  total)   venlafaxine  XR (EFFEXOR -XR) 37.5 MG 24 hr capsule Take 37.5 mg by mouth daily. (take with 150mg  capsule to equal 187.5mg  total)   vitamin B-12 (CYANOCOBALAMIN ) 1000 MCG tablet Take 1,000 mcg by mouth daily.   Vitamin D , Ergocalciferol , (DRISDOL) 1.25 MG (50000 UNIT) CAPS capsule Take 50,000 Units by mouth once a week.   XARELTO  20 MG TABS tablet TAKE 1 TABLET EVERY DAY WITH SUPPER   [DISCONTINUED] LORazepam  (ATIVAN ) 1 MG tablet Take 1 mg by mouth daily.   No facility-administered encounter medications on file as of 03/08/2024.    Social History: Social History   Tobacco Use   Smoking status: Former    Current packs/day: 0.00    Average packs/day: 2.0 packs/day for 42.0 years (84.0 ttl pk-yrs)    Types: Cigarettes    Start date: 07/04/1965    Quit date: 07/05/2007    Years since quitting: 16.6   Smokeless tobacco: Never  Vaping Use   Vaping status: Never Used  Substance Use Topics   Alcohol use: No    Comment:  occasional glass of wine once a month   Drug use: No    Family Medical History: Family History  Problem Relation Age of Onset   Breast cancer Mother 23   Emphysema Father        smoked   Colon cancer Sister    Uterine cancer Sister    Throat cancer Brother     Physical Examination: Vitals:   03/08/24 1432  BP: 110/70      Awake, alert, oriented to person,  place, and time.  Speech is clear and fluent. Fund of knowledge is appropriate.   Cranial Nerves: Pupils equal round and reactive to light.  Facial tone is symmetric.    No lower posterior lumbar tenderness.   No abnormal lesions on exposed skin.   Strength: Side Iliopsoas Quads Hamstring PF DF EHL  R 5 5 5 5 5 5   L 5 5 5 5 5 5    Reflexes are 2+ and symmetric at the patella and achilles.     Bilateral lower extremity sensation is intact to light touch.     No pain with IR/ER of left or right hip.    She has a slow gait.   Medical Decision Making  Imaging: none  Assessment and Plan: Ms. Amador is about the same. She continues with constant LBP with radiation into left groin and left medial leg to her ankle. She has weakness and tingling in her legs.   Her primary complaint is left groin pain, her LBP is more tolerable.   She has known lumbar spondylosis with severe DDD L2-L3 and L4-S1. She has moderate central stenosis L2-L3 with moderate bilateral foraminal stenosis and possible impingement right L2 nerve. She has mild bilateral lateral recess stenosis L3-L4. She has moderate central stenosis L4-L5 with displacement of left L4 nerve root.   Left groin pain is likely both hip and spine mediated. Had improvement with first left injection, but had no relief with left hip injection on 01/20/24. Foraminal stenosis at L2 is worse on right, but she has some on left as well. This may be contributing to left groin pain.   Treatment options discussed with patient and following plan made:   - She will finish out PT  for lumbar spine.  - She will follow up with Dr. Ryland to discuss further injections. May try injection at L2-L3?  - If no improvement, may consider bilateral LE EMG/NCS.  - I will message her in 4-6 weeks to check on her progress.   I spent a total of 30 minutes in face-to-face and non-face-to-face activities related to this patient's care today including review of outside records, review of imaging, review of symptoms, physical exam, discussion of differential diagnosis, discussion of treatment options, and documentation.   Thank you for involving me in the care of this patient.   Glade Boys PA-C Dept. of Neurosurgery  "

## 2024-03-08 ENCOUNTER — Ambulatory Visit: Admitting: Orthopedic Surgery

## 2024-03-08 ENCOUNTER — Encounter: Payer: Self-pay | Admitting: Orthopedic Surgery

## 2024-03-08 ENCOUNTER — Inpatient Hospital Stay: Attending: Oncology

## 2024-03-08 VITALS — BP 110/70 | Wt 186.0 lb

## 2024-03-08 DIAGNOSIS — M47816 Spondylosis without myelopathy or radiculopathy, lumbar region: Secondary | ICD-10-CM

## 2024-03-08 DIAGNOSIS — R1032 Left lower quadrant pain: Secondary | ICD-10-CM

## 2024-03-08 DIAGNOSIS — M51362 Other intervertebral disc degeneration, lumbar region with discogenic back pain and lower extremity pain: Secondary | ICD-10-CM

## 2024-03-08 DIAGNOSIS — M4726 Other spondylosis with radiculopathy, lumbar region: Secondary | ICD-10-CM

## 2024-03-08 DIAGNOSIS — Z08 Encounter for follow-up examination after completed treatment for malignant neoplasm: Secondary | ICD-10-CM

## 2024-03-08 DIAGNOSIS — M5416 Radiculopathy, lumbar region: Secondary | ICD-10-CM

## 2024-03-08 DIAGNOSIS — M48061 Spinal stenosis, lumbar region without neurogenic claudication: Secondary | ICD-10-CM | POA: Diagnosis not present

## 2024-03-08 DIAGNOSIS — M25552 Pain in left hip: Secondary | ICD-10-CM

## 2024-03-08 NOTE — Patient Instructions (Signed)
 Device With a Small Disc Placed for Long-Term IV Use (Implanted Port): Care at Home An implanted port is a device with a small disc that's put under your skin. In most cases, it's placed in your chest. It can be used to draw blood and send medicines and fluids quickly into your bloodstream. You may need a port to give you: IV medicines that would bother the small veins in your hands or arms. IV medicines for a long time, such as medicines to kill cancer cells (chemotherapy). IV liquid nutrition for a long time. An implanted port has 2 main parts: The portal. This is the round disc where the needle is put in. It may look like a small, raised area under your skin. The catheter. This is a soft tube that connects the port to a vein. You may be able to do more everyday tasks with a port than with other types of long-term IVs. How is my port accessed?  A numbing cream may be put on the skin over the port site. Your skin will be cleaned with a solution that kills germs. Your health care provider will gently pinch the port and put a needle in it. The port will be checked to make sure it's in the vein and working. If you need to get medicine all the time, the needle may be left in the port. Your provider will put a clear bandage over the needle site. The bandage and needle will need to be changed each week. What is flushing? Flushing helps keep the port working like it should. Flush your port as told. If your port is only used from time to time to give medicines or draw blood, you may need to flush it: Before and after medicines have been given. Before and after blood has been drawn. Every 4-6 weeks to keep it working well. If you get medicine through your port all the time, you may not need to flush it. Talk with your provider to learn more. How long will my port stay in? The port will stay in for as long as it's needed. When it's time for it to come out, you'll have a procedure to remove it. Follow  these instructions at home: Caring for your port and port site Flush your port as told. If you need to get an infusion of medicine over a few days, take care of your port as told. Make sure you: Take care of your port site. Wash your hands with soap and water  for at least 20 seconds before and after you change your bandage. If you can't use soap and water , use hand sanitizer. Put any used bandages or infusion bags in a plastic bag. Throw the bag in the trash. Keep the bandage that covers the needle clean and dry. Do not let it get wet. Do not use scissors or sharp objects near the tubes. Keep any tubes clamped, unless they're being used. Check the area around your port site every day for signs of infection. Check for: Redness, swelling, or pain. Fluid or blood. Warmth. Pus or a bad smell. Protect the skin around the port site. Avoid wearing bra straps that rub or irritate the site. Protect the skin around your port from seat belts. Place a soft pad over your chest if needed. Do not take baths, swim, or use a hot tub until you're told it's OK. Ask if you can shower. General instructions  Throw away any syringes in a container that's meant for sharp  items (sharps container). You can buy a sharps container from a pharmacy. You can also make one by using an empty, hard plastic bottle with a lid. Always carry a medical alert card or wear a medical alert bracelet. Ask what things are safe for you to do at home. Ask when you can go back to work or school. Where to find more information To learn more, go to: American Cancer Society at prombar.it. Click on the magnifying glass and type IV lines and ports. Find the link you need. American Society of Clinical Oncology at fabvets.de. Click on the magnifying glass and type getting chemo infusions. Find the link you need. Contact a health care provider if: You can't flush your port. You can't draw blood from the port. You have a fever or  chills. You have any signs of infection. You have swelling in your arm, neck, or shoulder. This information is not intended to replace advice given to you by your health care provider. Make sure you discuss any questions you have with your health care provider. Document Revised: 11/03/2023 Document Reviewed: 11/03/2023 Elsevier Patient Education  2025 [employer].

## 2024-03-08 NOTE — Patient Instructions (Signed)
 It was so nice to see you today. Thank you so much for coming in.    I think your back pain is from the wear and tear in your back. I think your left groin pain may be both from your back and from your hip.   I want you to follow up with Dr. Ryland to discuss trying an injection in your back to help with left groin pain. They should call you to schedule an appointment or you can call them at [phone].   I recommend finishing out your PT.   I will send you a message in 4-6 weeks to check on you. Please do not hesitate to call if you have any questions or concerns. You can also message me in MyChart.   Glade Boys PA-C [phone]     The physicians and staff at [facility] are committed to providing excellent care. You may receive a survey asking for feedback about your experience at our office. We value you your feedback and appreciate you taking the time to to fill it out. The [facility] leadership team is also available to discuss your experience in person, feel free to contact us  [phone].

## 2024-03-09 LAB — CA 125: Cancer Antigen (CA) 125: 32.2 U/mL (ref 0.0–38.1)

## 2024-06-06 ENCOUNTER — Inpatient Hospital Stay: Admitting: Oncology

## 2024-06-06 ENCOUNTER — Inpatient Hospital Stay

## 2024-06-08 ENCOUNTER — Ambulatory Visit: Admitting: Nurse Practitioner

## 2024-07-06 ENCOUNTER — Inpatient Hospital Stay
